# Patient Record
Sex: Female | Born: 1945 | Race: Black or African American | Hispanic: No | Smoking: Never smoker
Health system: Southern US, Community
[De-identification: ages and names within clinical notes are randomized; demographics above are authoritative.]

## PROBLEM LIST (undated history)

## (undated) DIAGNOSIS — G4730 Sleep apnea, unspecified: Secondary | ICD-10-CM

## (undated) DIAGNOSIS — E079 Disorder of thyroid, unspecified: Secondary | ICD-10-CM

## (undated) DIAGNOSIS — I10 Essential (primary) hypertension: Secondary | ICD-10-CM

## (undated) DIAGNOSIS — F32A Depression, unspecified: Secondary | ICD-10-CM

## (undated) DIAGNOSIS — K5792 Diverticulitis of intestine, part unspecified, without perforation or abscess without bleeding: Secondary | ICD-10-CM

## (undated) DIAGNOSIS — J45909 Unspecified asthma, uncomplicated: Secondary | ICD-10-CM

## (undated) DIAGNOSIS — E119 Type 2 diabetes mellitus without complications: Secondary | ICD-10-CM

## (undated) DIAGNOSIS — G8929 Other chronic pain: Secondary | ICD-10-CM

## (undated) DIAGNOSIS — C50919 Malignant neoplasm of unspecified site of unspecified female breast: Secondary | ICD-10-CM

## (undated) DIAGNOSIS — F419 Anxiety disorder, unspecified: Secondary | ICD-10-CM

## (undated) DIAGNOSIS — D649 Anemia, unspecified: Secondary | ICD-10-CM

## (undated) DIAGNOSIS — K219 Gastro-esophageal reflux disease without esophagitis: Secondary | ICD-10-CM

## (undated) DIAGNOSIS — M47817 Spondylosis without myelopathy or radiculopathy, lumbosacral region: Secondary | ICD-10-CM

## (undated) DIAGNOSIS — N189 Chronic kidney disease, unspecified: Secondary | ICD-10-CM

## (undated) HISTORY — PX: BREAST SURGERY: SHX581

## (undated) HISTORY — DX: Spondylosis without myelopathy or radiculopathy, lumbosacral region: M47.817

## (undated) HISTORY — DX: Other chronic pain: G89.29

## (undated) HISTORY — PX: OTHER SURGICAL HISTORY: SHX169

## (undated) HISTORY — PX: THYROIDECTOMY: SHX17

## (undated) HISTORY — PX: ABDOMINAL HYSTERECTOMY: SHX81

## (undated) HISTORY — PX: BACK SURGERY: SHX140

## (undated) HISTORY — DX: Malignant neoplasm of unspecified site of unspecified female breast: C50.919

## (undated) HISTORY — PX: BREAST BIOPSY: SHX20

---

## 2001-07-03 HISTORY — PX: BREAST LUMPECTOMY: SHX2

## 2007-01-01 ENCOUNTER — Emergency Department (HOSPITAL_COMMUNITY): Admission: EM | Admit: 2007-01-01 | Discharge: 2007-01-01 | Payer: Self-pay | Admitting: Family Medicine

## 2011-04-18 LAB — POCT URINALYSIS DIP (DEVICE)
Bilirubin Urine: NEGATIVE
Hgb urine dipstick: NEGATIVE
Ketones, ur: NEGATIVE
Protein, ur: NEGATIVE
Specific Gravity, Urine: <=1.005
pH: 6

## 2020-01-05 ENCOUNTER — Emergency Department (HOSPITAL_COMMUNITY)
Admission: EM | Admit: 2020-01-05 | Discharge: 2020-01-05 | Disposition: A | Discharge status: Home / Self Care | Payer: Medicare (Managed Care) | Attending: Emergency Medicine | Admitting: Emergency Medicine

## 2020-01-05 ENCOUNTER — Emergency Department (HOSPITAL_COMMUNITY): Payer: Medicare (Managed Care)

## 2020-01-05 ENCOUNTER — Encounter (HOSPITAL_COMMUNITY): Payer: Self-pay | Admitting: *Deleted

## 2020-01-05 ENCOUNTER — Other Ambulatory Visit: Payer: Self-pay

## 2020-01-05 DIAGNOSIS — E119 Type 2 diabetes mellitus without complications: Secondary | ICD-10-CM | POA: Insufficient documentation

## 2020-01-05 DIAGNOSIS — R110 Nausea: Secondary | ICD-10-CM | POA: Insufficient documentation

## 2020-01-05 DIAGNOSIS — K529 Noninfective gastroenteritis and colitis, unspecified: Secondary | ICD-10-CM | POA: Insufficient documentation

## 2020-01-05 DIAGNOSIS — Z7982 Long term (current) use of aspirin: Secondary | ICD-10-CM | POA: Diagnosis not present

## 2020-01-05 DIAGNOSIS — Z79899 Other long term (current) drug therapy: Secondary | ICD-10-CM | POA: Diagnosis not present

## 2020-01-05 DIAGNOSIS — I10 Essential (primary) hypertension: Secondary | ICD-10-CM | POA: Diagnosis not present

## 2020-01-05 DIAGNOSIS — R109 Unspecified abdominal pain: Secondary | ICD-10-CM | POA: Diagnosis present

## 2020-01-05 DIAGNOSIS — Z7984 Long term (current) use of oral hypoglycemic drugs: Secondary | ICD-10-CM | POA: Diagnosis not present

## 2020-01-05 HISTORY — DX: Gastro-esophageal reflux disease without esophagitis: K21.9

## 2020-01-05 HISTORY — DX: Type 2 diabetes mellitus without complications: E11.9

## 2020-01-05 HISTORY — DX: Essential (primary) hypertension: I10

## 2020-01-05 HISTORY — DX: Diverticulitis of intestine, part unspecified, without perforation or abscess without bleeding: K57.92

## 2020-01-05 LAB — URINALYSIS, ROUTINE W REFLEX MICROSCOPIC
Bilirubin Urine: NEGATIVE
Glucose, UA: NEGATIVE mg/dL
Ketones, ur: NEGATIVE mg/dL
Leukocytes,Ua: NEGATIVE
Nitrite: NEGATIVE
Protein, ur: NEGATIVE mg/dL
Specific Gravity, Urine: 1.015 (ref 1.005–1.030)
pH: 5 (ref 5.0–8.0)

## 2020-01-05 LAB — COMPREHENSIVE METABOLIC PANEL
ALT: 35 U/L (ref 0–44)
AST: 53 U/L — ABNORMAL HIGH (ref 15–41)
Albumin: 3.8 g/dL (ref 3.5–5.0)
Alkaline Phosphatase: 63 U/L (ref 38–126)
Anion gap: 8 (ref 5–15)
BUN: 21 mg/dL (ref 8–23)
CO2: 23 mmol/L (ref 22–32)
Calcium: 8.8 mg/dL — ABNORMAL LOW (ref 8.9–10.3)
Chloride: 104 mmol/L (ref 98–111)
Creatinine, Ser: 1.29 mg/dL — ABNORMAL HIGH (ref 0.44–1.00)
GFR calc Af Amer: 47 mL/min — ABNORMAL LOW (ref >60)
GFR calc non Af Amer: 41 mL/min — ABNORMAL LOW (ref >60)
Glucose, Bld: 186 mg/dL — ABNORMAL HIGH (ref 70–99)
Potassium: 3.5 mmol/L (ref 3.5–5.1)
Sodium: 135 mmol/L (ref 135–145)
Total Bilirubin: 0.6 mg/dL (ref 0.3–1.2)
Total Protein: 8.3 g/dL — ABNORMAL HIGH (ref 6.5–8.1)

## 2020-01-05 LAB — CBC
HCT: 35.3 % — ABNORMAL LOW (ref 36.0–46.0)
Hemoglobin: 10.9 g/dL — ABNORMAL LOW (ref 12.0–15.0)
MCH: 26.7 pg (ref 26.0–34.0)
MCHC: 30.9 g/dL (ref 30.0–36.0)
MCV: 86.3 fL (ref 80.0–100.0)
Platelets: 292 10*3/uL (ref 150–400)
RBC: 4.09 MIL/uL (ref 3.87–5.11)
RDW: 15.4 % (ref 11.5–15.5)
WBC: 9.5 10*3/uL (ref 4.0–10.5)
nRBC: 0 % (ref 0.0–0.2)

## 2020-01-05 LAB — LIPASE, BLOOD: Lipase: 54 U/L — ABNORMAL HIGH (ref 11–51)

## 2020-01-05 MED ORDER — IOHEXOL 300 MG/ML  SOLN
75.0000 mL | Freq: Once | INTRAMUSCULAR | Status: AC | PRN
  Administered 2020-01-05: 75 mL via INTRAVENOUS

## 2020-01-05 MED ORDER — SODIUM CHLORIDE 0.9 % IV SOLN: INTRAVENOUS | Status: DC

## 2020-01-05 MED ORDER — ONDANSETRON HCL 4 MG/2ML IJ SOLN
4.0000 mg | Freq: Once | INTRAMUSCULAR | Status: AC
  Administered 2020-01-05: 4 mg via INTRAVENOUS
  Filled 2020-01-05: qty 2

## 2020-01-05 MED ORDER — ONDANSETRON 4 MG PO TBDP: 4.0000 mg | ORAL_TABLET | Freq: Three times a day (TID) | ORAL | 1 refills | Status: DC | PRN

## 2020-01-05 MED ORDER — TRAMADOL HCL 50 MG PO TABS: 50.0000 mg | ORAL_TABLET | Freq: Four times a day (QID) | ORAL | 0 refills | Status: DC | PRN

## 2020-01-05 NOTE — ED Triage Notes (Signed)
Abdominal pain with nausea for 3 days

## 2020-01-05 NOTE — ED Provider Notes (Signed)
[facility] EMERGENCY DEPARTMENT Provider Note   CSN: [id number] Arrival date & time: 01/05/20  1727     History Chief Complaint  Patient presents with  . Abdominal Pain  . Nausea    Stephanie Moreno is a 74 y.o. female.  Patient recently located to this area from the [place].  Patient with complaint abdominal pain with nausea for 3 days.  No diarrhea.  Past medical history significant for hypertension acid reflux disease and diabetes.  Patient denies fever denies any blood in her bowel movements.  The abdominal pain is bilateral lower quadrant.  No vomiting.        Past Medical History:  Diagnosis Date  . Acid reflux disease   . Diabetes mellitus without complication ([place])   . Diverticulitis   . Hypertension     There are no problems to display for this patient.      OB History   No obstetric history on file.     No family history on file.  Social History   Tobacco Use  . Smoking status: Never Smoker  . Smokeless tobacco: Never Used  Substance Use Topics  . Alcohol use: Not Currently  . Drug use: Not Currently    Home Medications Prior to Admission medications   Medication Sig Start Date End Date Taking? Authorizing Provider  acetaminophen (TYLENOL) 500 MG tablet Take 500 mg by mouth every 6 (six) hours as needed for mild pain or moderate pain.   Yes [provider]  albuterol (VENTOLIN HFA) 108 (90 Base) MCG/ACT inhaler Inhale 1-2 puffs into the lungs every 6 (six) hours as needed for wheezing or shortness of breath.   Yes [provider]  aspirin EC 81 MG tablet Take 81 mg by mouth every morning. Swallow whole.   Yes [provider]  Calcium Carbonate-Vitamin D (CALCIUM-D PO) Take 1 tablet by mouth daily.   Yes [provider]  cholecalciferol (VITAMIN D3) 25 MCG (1000 UNIT) tablet Take 1,000 Units by mouth daily.   Yes [provider]  Cyanocobalamin (B-12 PO) Take 1 tablet by mouth daily.   Yes [provider]  Dulaglutide (TRULICITY [place]) Inject into the skin every Monday.   Yes [provider]  furosemide (LASIX) 40 MG tablet Take 40 mg by mouth daily.   Yes [provider]  hydroxypropyl methylcellulose / hypromellose (ISOPTO TEARS / GONIOVISC) 2.5 % ophthalmic solution Place 1 drop into both eyes 4 (four) times daily as needed for dry eyes.   Yes [provider]  lidocaine (LIDODERM) 5 % Place 1 patch onto the skin daily. Remove & Discard patch within 12 hours or as directed by MD. Applied to lower back/shoulders/neck for pain   Yes [provider]  metFORMIN (GLUCOPHAGE) 1000 MG tablet Take 1,000 mg by mouth daily.   Yes [provider]  ondansetron (ZOFRAN ODT) 4 MG disintegrating tablet Take 1 tablet (4 mg total) by mouth every 8 (eight) hours as needed. 01/05/20   Fredia Sorrow, MD  traMADol (ULTRAM) 50 MG tablet Take 1 tablet (50 mg total) by mouth every 6 (six) hours as needed. 01/05/20   Fredia Sorrow, MD    Allergies    Codeine, Lisinopril, and Penicillins  Review of Systems   Review of Systems  Constitutional: Negative for chills and fever.  HENT: Negative for congestion, rhinorrhea and sore throat.   Eyes: Negative for visual disturbance.  Respiratory: Negative for cough and shortness of breath.   Cardiovascular: Negative  for chest pain and leg swelling.  Gastrointestinal: Positive for abdominal pain and nausea. Negative for diarrhea and vomiting.  Genitourinary: Negative for dysuria.  Musculoskeletal: Negative for back pain and neck pain.  Skin: Negative for rash.  Neurological: Negative for dizziness, light-headedness and headaches.  Hematological: Does not bruise/bleed easily.  Psychiatric/Behavioral: Negative for confusion.    Physical Exam Updated Vital Signs BP (!) 122/98   Pulse 71   Temp (!) 97.1 F (36.2 C) (Oral)   Resp 19   SpO2 100%   Physical Exam Vitals and nursing note reviewed.  Constitutional:       General: She is not in acute distress.    Appearance: Normal appearance. She is well-developed.  HENT:     Head: Normocephalic and atraumatic.  Eyes:     Extraocular Movements: Extraocular movements intact.     Conjunctiva/sclera: Conjunctivae normal.     Pupils: Pupils are equal, round, and reactive to light.  Cardiovascular:     Rate and Rhythm: Normal rate and regular rhythm.     Heart sounds: No murmur heard.   Pulmonary:     Effort: Pulmonary effort is normal. No respiratory distress.     Breath sounds: Normal breath sounds.  Abdominal:     Palpations: Abdomen is soft.     Tenderness: There is no abdominal tenderness. There is no guarding.  Musculoskeletal:        General: No swelling. Normal range of motion.     Cervical back: Normal range of motion and neck supple.  Skin:    General: Skin is warm and dry.     Capillary Refill: Capillary refill takes less than 2 seconds.  Neurological:     General: No focal deficit present.     Mental Status: She is alert and oriented to person, place, and time.     Cranial Nerves: No cranial nerve deficit.     Sensory: No sensory deficit.     ED Results / Procedures / Treatments   Labs (all labs ordered are listed, but only abnormal results are displayed) Labs Reviewed  LIPASE, BLOOD - Abnormal; Notable for the following components:      Result Value   Lipase 54 (*)    All other components within normal limits  COMPREHENSIVE METABOLIC PANEL - Abnormal; Notable for the following components:   Glucose, Bld 186 (*)    Creatinine, Ser 1.29 (*)    Calcium 8.8 (*)    Total Protein 8.3 (*)    AST 53 (*)    GFR calc non Af Amer 41 (*)    GFR calc Af Amer 47 (*)    All other components within normal limits  CBC - Abnormal; Notable for the following components:   Hemoglobin 10.9 (*)    HCT 35.3 (*)    All other components within normal limits  URINALYSIS, ROUTINE W REFLEX MICROSCOPIC - Abnormal; Notable for the following  components:   Color, Urine STRAW (*)    Hgb urine dipstick SMALL (*)    Bacteria, UA RARE (*)    All other components within normal limits    EKG None  Radiology CT Abdomen Pelvis W Contrast  Result Date: 01/05/2020 CLINICAL DATA:  Abdominal pain, nausea and diarrhea for 3 days, recent hospitalization for diverticulitis EXAM: CT ABDOMEN AND PELVIS WITH CONTRAST TECHNIQUE: Multidetector CT imaging of the abdomen and pelvis was performed using the standard protocol following bolus administration of intravenous contrast. CONTRAST:  109mL OMNIPAQUE IOHEXOL 300 MG/ML  SOLN COMPARISON:  No outside imaging available. FINDINGS: Lower chest: Atelectatic changes in the lung bases. Few bandlike areas of opacity likely reflect further atelectasis or scarring. Lung bases otherwise clear. Normal heart size. No pericardial effusion. Coronary artery osteosclerosis is noted. Hepatobiliary: Diffuse hepatic hypoattenuation compatible with hepatic steatosis. Sparing along the gallbladder fossa. Smooth surface contour of the liver. Some layering density seen towards the gallbladder neck could reflect a noncalcified gallstone, adenomyomatosis or biliary sludge (2/31). No pericholecystic fluid. Borderline dilatation of the biliary tree with the common bile duct measuring up to 8 mm, slightly greater than expected for senescent change. No visible intraductal gallstones. Pancreas: Unremarkable. No pancreatic ductal dilatation or surrounding inflammatory changes. Spleen: Normal in size without focal abnormality. Adrenals/Urinary Tract: Normal adrenal glands. Multiple cortical and parapelvic fluid attenuation cysts. No concerning renal lesions. No urolithiasis or hydronephrosis. Urinary bladder is largely decompressed at the time of exam and therefore poorly evaluated by CT imaging. No gross bladder abnormality. Stomach/Bowel: Distal esophagus, stomach and duodenal sweep are unremarkable. No small bowel wall thickening or  dilatation. No evidence of obstruction. Noninflamed appendix seen in the right lower quadrant. There is some questionable thickening versus underdistention with faint pericolonic stranding involving the descending colon to the level of the sigmoid. Scattered colonic diverticula without focal inflammation to suggest diverticulitis. Vascular/Lymphatic: Atherosclerotic calcifications within the abdominal aorta and branch vessels. No aneurysm or ectasia. No enlarged abdominopelvic lymph nodes. Reproductive: Uterus is surgically absent. No concerning adnexal lesions. Other: No abdominopelvic free fluid or free gas. No bowel containing hernias. Musculoskeletal: Postsurgical changes the anterior abdominal wall with asymmetric atrophy of the right rectus sheath. Multilevel degenerative changes are present in the imaged portions of the spine. Additional degenerative changes in the hips and pelvis. IMPRESSION: 1. Questionable thickening versus underdistention with faint pericolonic stranding involving the descending colon to the level of the sigmoid could reflect a mild colitis. 2. Colonic diverticulosis without evidence of acute diverticulitis. 3. Hepatic steatosis. 4. Some layering density seen towards the gallbladder neck could reflect a noncalcified gallstone, adenomyomatosis or biliary sludge. No pericholecystic fluid. If there is clinical concern for acute cholecystitis, consider further evaluation with right upper quadrant ultrasound. 5. Borderline dilatation of the biliary tree with the common bile duct measuring up to 8 mm, slightly greater than expected for senescent change. No visible intraductal gallstones. Could correlate with liver serologies and consider further evaluation with MRCP if clinically warranted. 6. Aortic Atherosclerosis (ICD10-I70.0). Electronically Signed   By: Lovena Le M.D.   On: 01/05/2020 21:34    Procedures Procedures (including critical care time)  Medications Ordered in  ED Medications  0.9 %  sodium chloride infusion ( Intravenous New Bag/Given 01/05/20 1921)  ondansetron (ZOFRAN) injection 4 mg (4 mg Intravenous Given 01/05/20 1921)  iohexol (OMNIPAQUE) 300 MG/ML solution 75 mL (75 mLs Intravenous Contrast Given 01/05/20 2111)    ED Course  I have reviewed the triage vital signs and the nursing notes.  Pertinent labs & imaging results that were available during my care of the patient were reviewed by me and considered in my medical decision making (see chart for details).    MDM Rules/Calculators/A&P                          Abdomen soft and nontender.  CT scan shows evidence of some colitis.  Some inflammation around the gallbladder but there is absolutely no subjective or objective tenderness in the right upper quadrant.  There was no tenderness to the lower quadrants of the abdomen either.  Patient's liver function tests without significant abnormalities.  No leukocytosis.  Not worried about an acute cholecystitis picture.  Does not even sound as if her pain is in that location.  Her pain could be related to the colitis.  Will refer to GI medicine.  Will start on tramadol and Zofran.  Since no fever and no leukocytosis will not start on antibiotics.  Patient stable nontoxic Final Clinical Impression(s) / ED Diagnoses Final diagnoses:  Colitis    Rx / DC Orders ED Discharge Orders         Ordered    ondansetron (ZOFRAN ODT) 4 MG disintegrating tablet  Every 8 hours PRN     Discontinue  Reprint     01/05/20 2256    traMADol (ULTRAM) 50 MG tablet  Every 6 hours PRN     Discontinue  Reprint     01/05/20 2256           Fredia Sorrow, MD 01/05/20 2259

## 2020-01-05 NOTE — Discharge Instructions (Addendum)
CT scan showed evidence of large intestine inflammation called colitis.  Would recommend a bland diet for the next few days.  Take the Zofran as needed for nausea.  Take the pain medicine as needed.  Return for any new or worse symptoms.  Make an appointment to follow-up with GI medicine.  Dr. Roseanne Kaufman office information provided above.

## 2020-01-06 ENCOUNTER — Telehealth: Payer: Self-pay | Admitting: Internal Medicine

## 2020-01-06 ENCOUNTER — Telehealth: Payer: Self-pay

## 2020-01-06 NOTE — Telephone Encounter (Signed)
Pt was seen at the ED yesterday 01/05/20 and was asked to make an apt with RMR. Pt moved to [place] last week and doesn't have a PCP yet. cell [phone]

## 2020-01-06 NOTE — Telephone Encounter (Signed)
Er referral

## 2020-01-06 NOTE — Telephone Encounter (Signed)
Ok to schedule ov.  

## 2020-01-07 ENCOUNTER — Encounter: Payer: Self-pay | Admitting: Internal Medicine

## 2020-01-07 NOTE — Telephone Encounter (Signed)
Noted  

## 2020-01-07 NOTE — Telephone Encounter (Signed)
Patient scheduled for appointment and called and sent letter

## 2020-01-28 ENCOUNTER — Ambulatory Visit (INDEPENDENT_AMBULATORY_CARE_PROVIDER_SITE_OTHER): Payer: Medicare (Managed Care) | Admitting: Gastroenterology

## 2020-01-28 ENCOUNTER — Other Ambulatory Visit: Payer: Self-pay

## 2020-01-28 ENCOUNTER — Encounter: Payer: Self-pay | Admitting: Gastroenterology

## 2020-01-28 ENCOUNTER — Telehealth: Payer: Self-pay | Admitting: *Deleted

## 2020-01-28 ENCOUNTER — Other Ambulatory Visit (HOSPITAL_COMMUNITY)
Admission: RE | Admit: 2020-01-28 | Discharge: 2020-01-28 | Disposition: A | Discharge status: Home / Self Care | Payer: Medicare (Managed Care) | Source: Ambulatory Visit | Attending: Gastroenterology | Admitting: Gastroenterology

## 2020-01-28 DIAGNOSIS — R1030 Lower abdominal pain, unspecified: Secondary | ICD-10-CM

## 2020-01-28 DIAGNOSIS — D649 Anemia, unspecified: Secondary | ICD-10-CM

## 2020-01-28 DIAGNOSIS — K5900 Constipation, unspecified: Secondary | ICD-10-CM | POA: Diagnosis not present

## 2020-01-28 DIAGNOSIS — R1013 Epigastric pain: Secondary | ICD-10-CM | POA: Diagnosis not present

## 2020-01-28 LAB — IRON AND TIBC
Iron: 42 ug/dL (ref 28–170)
Saturation Ratios: 10 % — ABNORMAL LOW (ref 10.4–31.8)
TIBC: 425 ug/dL (ref 250–450)
UIBC: 383 ug/dL

## 2020-01-28 LAB — CBC WITH DIFFERENTIAL/PLATELET
Abs Immature Granulocytes: 0.02 10*3/uL (ref 0.00–0.07)
Basophils Absolute: 0.1 10*3/uL (ref 0.0–0.1)
Basophils Relative: 1 %
Eosinophils Absolute: 0.4 10*3/uL (ref 0.0–0.5)
Eosinophils Relative: 5 %
HCT: 38.6 % (ref 36.0–46.0)
Hemoglobin: 11.9 g/dL — ABNORMAL LOW (ref 12.0–15.0)
Immature Granulocytes: 0 %
Lymphocytes Relative: 52 %
Lymphs Abs: 3.7 10*3/uL (ref 0.7–4.0)
MCH: 26.6 pg (ref 26.0–34.0)
MCHC: 30.8 g/dL (ref 30.0–36.0)
MCV: 86.4 fL (ref 80.0–100.0)
Monocytes Absolute: 0.4 10*3/uL (ref 0.1–1.0)
Monocytes Relative: 6 %
Neutro Abs: 2.6 10*3/uL (ref 1.7–7.7)
Neutrophils Relative %: 36 %
Platelets: 326 10*3/uL (ref 150–400)
RBC: 4.47 MIL/uL (ref 3.87–5.11)
RDW: 15.2 % (ref 11.5–15.5)
WBC: 7.1 10*3/uL (ref 4.0–10.5)
nRBC: 0 % (ref 0.0–0.2)

## 2020-01-28 LAB — COMPREHENSIVE METABOLIC PANEL
ALT: 43 U/L (ref 0–44)
AST: 65 U/L — ABNORMAL HIGH (ref 15–41)
Albumin: 4 g/dL (ref 3.5–5.0)
Alkaline Phosphatase: 73 U/L (ref 38–126)
Anion gap: 9 (ref 5–15)
BUN: 13 mg/dL (ref 8–23)
CO2: 25 mmol/L (ref 22–32)
Calcium: 9 mg/dL (ref 8.9–10.3)
Chloride: 103 mmol/L (ref 98–111)
Creatinine, Ser: 1.1 mg/dL — ABNORMAL HIGH (ref 0.44–1.00)
GFR calc Af Amer: 57 mL/min — ABNORMAL LOW (ref >60)
GFR calc non Af Amer: 49 mL/min — ABNORMAL LOW (ref >60)
Glucose, Bld: 129 mg/dL — ABNORMAL HIGH (ref 70–99)
Potassium: 3.7 mmol/L (ref 3.5–5.1)
Sodium: 137 mmol/L (ref 135–145)
Total Bilirubin: 0.5 mg/dL (ref 0.3–1.2)
Total Protein: 8.5 g/dL — ABNORMAL HIGH (ref 6.5–8.1)

## 2020-01-28 LAB — FERRITIN: Ferritin: 19 ng/mL (ref 11–307)

## 2020-01-28 MED ORDER — DOCUSATE SODIUM 100 MG PO CAPS: 100.0000 mg | ORAL_CAPSULE | Freq: Two times a day (BID) | ORAL | 3 refills | Status: DC

## 2020-01-28 MED ORDER — PANTOPRAZOLE SODIUM 40 MG PO TBEC: 40.0000 mg | DELAYED_RELEASE_TABLET | Freq: Every day | ORAL | 3 refills | Status: DC

## 2020-01-28 NOTE — Patient Instructions (Signed)
We are arranging a colonoscopy and upper endoscopy with Dr. Abbey Chatters in the near future.  For constipation: let's trial a stool softener twice per day. Give this a few days and if no improvement, then start taking the [place] samples once each morning, 30 minutes before breakfast. This can cause some loose stool when first starting out but should get better. If this is not helpful, we will increase the dose.  Start taking Protonix once each morning, 30 minutes before breakfast. This is for reflux. I feel this will probably help your upper abdominal discomfort and nausea, too.  We will likely be ordering an MRI in the near future after review of labs.  We will see you in 2 months!  It was a pleasure to see you today. I want to create trusting relationships with patients to provide genuine, compassionate, and quality care. I value your feedback. If you receive a survey regarding your visit,  I greatly appreciate you taking time to fill this out.   Annitta Needs, PhD, ANP-BC [facility]

## 2020-01-28 NOTE — Telephone Encounter (Signed)
Called spoke with pt. She is scheduled for tcs/egd with propofol with Dr. Abbey Chatters 8/19 at 9:15am. Pt aware needs covid test prior and is scheduled for 8/18 at 8:05am. She is aware will mail prep instructions for miralax and covid appt. Confirmed mailing address is correct.

## 2020-01-28 NOTE — Progress Notes (Signed)
Primary Care Physician:  Patient, No Pcp Per  Referring Physician: [facility]  Primary Gastroenterologist:  Dr. Abbey Chatters  Chief Complaint  Patient presents with  . Nausea    HPI:   Stephanie Moreno is a 74 y.o. female presenting today at the request of [facility] due to colitis. She used to live in [place], moving here the end of June 2021. Reports long-standing history of abdominal pain but feels this has been worsening recently, prompting ED presentation. Trying to establish care with a local PCP but does not remember the name. States A1c was around a 9 before moving here. States her GFR was 33. Her sister is an Therapist, sports and used to work at [employer] many years ago.    Has been having nausea, sharp abdominal pain, worsening from baseline. Has been to the ED 3 times, going to ED in [place]. By time she had gotten to the ED in [place] pain had calmed down. Bloody diarrhea in [place] several years ago but none recently.   Still with abdominal pain intermittently, crampy. Feels like Moreno to have a BM but strains and only small balls come out. Pain with slight improvement after BM. Taken Miralax but causes looser stool everywhere. Miralax twice a week. BM about twice per week unless taking Miralax.   Constipation chronic. Has had to take laxatives in the past. carafate prescribed in [place]. Pain after eating in epigastric area. Burning pain and some improvement after eating. Takes only extra strength tylenol. +GERD. No PPI. Years ago was on a PPI. No known history of PUD. Used to weigh around 185 in May at PCP's office. Now 180.   Nausea: eats a small amount and is associated with burning sensation. Carafate helps with the nausea. Has been on Carafate about a year.    Colonoscopy several years ago in [place] and believes she had polyps.EGD about 2 years ago in [place] but unclear results.   No family history of colorectal cancer. 6-7 months ago started having  difficulty ambulating. States she was having trouble walking forwards and would start walking backwards. Has had falls. Was worried about being place in a nursing facility. Lived alone since 2003. A year or so ago lost 5 sisters. 3 sisters left.   Past Medical History:  Diagnosis Date  . Acid reflux disease   . Breast cancer ([place])   . Diabetes mellitus without complication ([place])   . Diverticulitis   . Hypertension     Past Surgical History:  Procedure Laterality Date  . BREAST SURGERY    . THYROIDECTOMY    . tummy tuck     at time of breast surgery 2003    Current Outpatient Medications  Medication Sig Dispense Refill  . aspirin EC 81 MG tablet Take 81 mg by mouth every morning. Swallow whole.    . furosemide (LASIX) 40 MG tablet Take 40 mg by mouth daily.    Marland Kitchen lidocaine (LIDODERM) 5 % Place 1 patch onto the skin daily as needed (pain). Remove & Discard patch within 12 hours or as directed by MD. Applied to lower back/shoulders/neck for pain     . metFORMIN (GLUCOPHAGE) 1000 MG tablet Take 1,000 mg by mouth daily.    . ondansetron (ZOFRAN ODT) 4 MG disintegrating tablet Take 1 tablet (4 mg total) by mouth every 8 (eight) hours as needed. (Patient not taking: Reported on 01/30/2020) 10 tablet 1  . sucralfate (CARAFATE) 1 GM/10ML suspension Take 1.5 g by  mouth 3 (three) times daily as needed (indigestion).     . traMADol (ULTRAM) 50 MG tablet Take 1 tablet (50 mg total) by mouth every 6 (six) hours as needed. (Patient not taking: Reported on 01/30/2020) 15 tablet 0  . acetaminophen (TYLENOL) 500 MG tablet Take 1,000 mg by mouth 2 (two) times daily as needed for moderate pain.    Marland Kitchen acetaminophen (TYLENOL) 650 MG CR tablet Take 1,300 mg by mouth daily as needed (migraines).     Marland Kitchen amitriptyline (ELAVIL) 25 MG tablet Take 25 mg by mouth at bedtime.    . beclomethasone (QVAR) 80 MCG/ACT inhaler Inhale 1 puff into the lungs 2 (two) times daily as needed (shortness of breath).    .  calcium-vitamin D (OSCAL WITH D) 500-200 MG-UNIT tablet Take 1 tablet by mouth 3 (three) times daily.    Marland Kitchen docusate sodium (COLACE) 100 MG capsule Take 1 capsule (100 mg total) by mouth 2 (two) times daily. (Patient not taking: Reported on 01/30/2020) 60 capsule 3  . Dulaglutide (TRULICITY) 1.5 TK/1.6WF SOPN Inject 1.5 mg into the skin every Monday.    Marland Kitchen glipiZIDE (GLUCOTROL XL) 10 MG 24 hr tablet Take 10 mg by mouth daily with breakfast.    . GLUCOSAMINE-CHONDROITIN PO Take 1 tablet by mouth daily.    Marland Kitchen linaclotide (LINZESS) 72 MCG capsule Take 72 mcg by mouth daily.    Marland Kitchen losartan (COZAAR) 50 MG tablet Take 100 mg by mouth daily.    . magnesium oxide (MAG-OX) 400 MG tablet Take 400 mg by mouth daily.    . meclizine (ANTIVERT) 25 MG tablet Take 25 mg by mouth 3 (three) times daily as needed for dizziness.    . metoprolol tartrate (LOPRESSOR) 50 MG tablet Take 50 mg by mouth 2 (two) times daily.    . Naphazoline HCl (CLEAR EYES OP) Place 1 drop into both eyes daily as needed (dry eyes).    Marland Kitchen omeprazole (PRILOSEC) 40 MG capsule Take 40 mg by mouth daily.    . pantoprazole (PROTONIX) 40 MG tablet Take 1 tablet (40 mg total) by mouth daily. 30 minutes before breakfast daily (Patient not taking: Reported on 01/30/2020) 30 tablet 3  . polyethylene glycol (MIRALAX / GLYCOLAX) 17 g packet Take 17 g by mouth daily as needed for moderate constipation.    . rosuvastatin (CRESTOR) 10 MG tablet Take 10 mg by mouth at bedtime.     . verapamil (VERELAN PM) 180 MG 24 hr capsule Take 180 mg by mouth daily.    . vitamin B-12 (CYANOCOBALAMIN) 1000 MCG tablet Take 1,000 mcg by mouth 2 (two) times daily.     No current facility-administered medications for this visit.    Allergies as of 01/28/2020 - Review Complete 01/28/2020  Allergen Reaction Noted  . Codeine Nausea And Vomiting 01/05/2020  . Lisinopril Nausea And Vomiting 01/05/2020  . Penicillins Hives 01/05/2020    Family History  Problem Relation Age of  Onset  . Colon cancer Neg Hx     Social History   Socioeconomic History  . Marital status: Divorced    Spouse name: Not on file  . Number of children: Not on file  . Years of education: Not on file  . Highest education level: Not on file  Occupational History  . 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 Smoking status: Never Smoker  . Smokeless tobacco: Never Used  Substance and Sexual Activity  .  Alcohol use: Not Currently  . Drug use: Not Currently  . Sexual activity: Not on file  Other Topics Concern  . Not on file  Social History Narrative  . Not on file   Social Determinants of Health   Financial Resource Strain:   . Difficulty of Paying Living Expenses:   Food Insecurity:   . Worried About Charity fundraiser in the Last Year:   . Arboriculturist in the Last Year:   Transportation Moreno:   . Film/video editor (Medical):   Marland Kitchen Lack of Transportation (Non-Medical):   Physical Activity:   . Days of Exercise per Week:   . Minutes of Exercise per Session:   Stress:   . Feeling of Stress :   Social Connections:   . Frequency of Communication with Friends and Family:   . Frequency of Social Gatherings with Friends and Family:   . Attends Religious Services:   . Active Member of Clubs or Organizations:   . Attends Archivist Meetings:   Marland Kitchen Marital Status:   Intimate Partner Violence:   . Fear of Current or Ex-Partner:   . Emotionally Abused:   Marland Kitchen Physically Abused:   . Sexually Abused:     Review of Systems: Gen: see HPI CV: Denies chest pain, heart palpitations, peripheral edema, syncope.  Resp: Denies shortness of breath at rest or with exertion. Denies wheezing or cough.  GI: see HPI GU : Denies urinary burning, urinary frequency, urinary hesitancy MS: see HPI  Derm: Denies rash, itching, dry skin Psych: Denies depression, anxiety, memory loss, and confusion Heme: Denies bruising, bleeding, and  enlarged lymph nodes.  Physical Exam: BP (!) 154/85   Pulse 99   Temp (!) 96.2 F (35.7 C) (Temporal)   Ht 5\' 4"  (1.626 m)   Wt 180 lb (81.6 kg)   BMI 30.90 kg/m  General:   Alert and oriented. Pleasant and cooperative. Well-nourished and well-developed.  Head:  Normocephalic and atraumatic. Eyes:  Without icterus, sclera clear and conjunctiva pink.  Ears:  Normal auditory acuity. Mouth:  Mask in place  Lungs:  Clear to auscultation bilaterally. No wheezes, rales, or rhonchi. No distress.  Heart:  S1, S2 present without murmurs appreciated.  Abdomen:  +BS, soft, TTP epigastric and lower abdomen. No HSM noted. No guarding or rebound. No masses appreciated.  Rectal:  Deferred Extremities:  Pedal edema Neurologic:  Alert and  oriented x4 Skin:  Intact without significant lesions or rashes. Psych:  Alert and cooperative. Normal mood and affect.  ASSESSMENT: DENYA BUCKINGHAM is a 74 y.o. female presenting today with a long-standing history of abdominal pain and nausea, previously established with GI in [place] but recently moving to the area in June 2021.   Recent imaging in the ED July 5th, 2021, with questionable thickening versus under-distension and faint pericolonic stranding involving descending colon to level of the sigmoid. She has no diarrhea. No antibiotics have been prescribed; in fact, she describes more constipation that I feel is contributing to her abdominal discomfort. With colonoscopy several years ago in [place] and reported history of polyps, will arrange colonoscopy in the near future.   Dyspepsia also reported but actually improved after eating. She has been chronically on Carafate from [place] but no PPI. Will start Protonix once daily and add EGD at time of colonoscopy. Symptoms not consistent with biliary etiology. NO weight loss or postprandial pain to raise concern for chronic mesenteric ischemia.   Normocytic anemia: unknown  baseline. Recheck CBC  and add iron studies. TCS/EGD as planned.   CBD dilation 8 mm: gallbladder remains present. Check HFP. May ultimately need MRCP for further evaluation.   As of note, plan of care was discussed with patient's sister at her request, a retired Therapist, sports.    PLAN:  Proceed with TCS/EGD with Dr. Abbey Chatters in near future: the risks, benefits, and alternatives have been discussed with the patient in detail. The patient states understanding and desires to proceed.  Linzess 72 mcg samples provided, may need to trial Amitiza if fails Linzess  CBC, CMP, iron studies today  Consider MRCP  Return in 2 months  Stephanie Needs, PhD, [facility]

## 2020-02-03 ENCOUNTER — Telehealth: Payer: Self-pay | Admitting: Emergency Medicine

## 2020-02-03 NOTE — Telephone Encounter (Signed)
error 

## 2020-02-04 ENCOUNTER — Other Ambulatory Visit: Payer: Self-pay | Admitting: Emergency Medicine

## 2020-02-04 DIAGNOSIS — R1030 Lower abdominal pain, unspecified: Secondary | ICD-10-CM

## 2020-02-05 NOTE — Progress Notes (Signed)
NO PCP PER PATIENT °

## 2020-02-06 ENCOUNTER — Encounter (HOSPITAL_COMMUNITY)
Admission: RE | Admit: 2020-02-06 | Discharge: 2020-02-06 | Disposition: A | Discharge status: Home / Self Care | Payer: Medicare HMO | Source: Ambulatory Visit | Attending: Internal Medicine | Admitting: Internal Medicine

## 2020-02-06 ENCOUNTER — Other Ambulatory Visit: Payer: Self-pay

## 2020-02-06 ENCOUNTER — Encounter (HOSPITAL_COMMUNITY): Payer: Self-pay

## 2020-02-06 HISTORY — DX: Disorder of thyroid, unspecified: E07.9

## 2020-02-06 HISTORY — DX: Sleep apnea, unspecified: G47.30

## 2020-02-10 ENCOUNTER — Other Ambulatory Visit: Payer: Self-pay | Admitting: *Deleted

## 2020-02-10 ENCOUNTER — Encounter: Payer: Self-pay | Admitting: *Deleted

## 2020-02-10 ENCOUNTER — Telehealth: Payer: Self-pay | Admitting: *Deleted

## 2020-02-10 DIAGNOSIS — D649 Anemia, unspecified: Secondary | ICD-10-CM

## 2020-02-10 DIAGNOSIS — R1030 Lower abdominal pain, unspecified: Secondary | ICD-10-CM

## 2020-02-10 DIAGNOSIS — R1013 Epigastric pain: Secondary | ICD-10-CM

## 2020-02-10 DIAGNOSIS — K5900 Constipation, unspecified: Secondary | ICD-10-CM

## 2020-02-10 DIAGNOSIS — K838 Other specified diseases of biliary tract: Secondary | ICD-10-CM

## 2020-02-10 NOTE — Telephone Encounter (Signed)
PA approve through [facility] for MRI/MRCP. Auth# [id number] Dates 02/10/2020-08/08/2020

## 2020-02-12 DIAGNOSIS — R1030 Lower abdominal pain, unspecified: Secondary | ICD-10-CM | POA: Diagnosis not present

## 2020-02-13 LAB — HEPATITIS B SURFACE ANTIGEN: Hepatitis B Surface Ag: NONREACTIVE

## 2020-02-13 LAB — HEPATITIS C ANTIBODY
Hepatitis C Ab: NONREACTIVE
SIGNAL TO CUT-OFF: 0.08 (ref <1.00)

## 2020-02-18 ENCOUNTER — Other Ambulatory Visit (HOSPITAL_COMMUNITY)
Admission: RE | Admit: 2020-02-18 | Discharge: 2020-02-18 | Disposition: A | Discharge status: Home / Self Care | Payer: Medicare HMO | Source: Ambulatory Visit | Attending: Internal Medicine | Admitting: Internal Medicine

## 2020-02-18 ENCOUNTER — Other Ambulatory Visit: Payer: Self-pay

## 2020-02-18 DIAGNOSIS — Z01812 Encounter for preprocedural laboratory examination: Secondary | ICD-10-CM | POA: Insufficient documentation

## 2020-02-18 DIAGNOSIS — Z20822 Contact with and (suspected) exposure to covid-19: Secondary | ICD-10-CM | POA: Diagnosis not present

## 2020-02-18 LAB — SARS CORONAVIRUS 2 (TAT 6-24 HRS): SARS Coronavirus 2: NEGATIVE

## 2020-02-19 ENCOUNTER — Other Ambulatory Visit: Payer: Self-pay

## 2020-02-19 ENCOUNTER — Ambulatory Visit (HOSPITAL_COMMUNITY): Payer: Medicare HMO | Admitting: Anesthesiology

## 2020-02-19 ENCOUNTER — Encounter (HOSPITAL_COMMUNITY): Payer: Self-pay | Admitting: *Deleted

## 2020-02-19 ENCOUNTER — Ambulatory Visit (HOSPITAL_COMMUNITY)
Admission: RE | Admit: 2020-02-19 | Discharge: 2020-02-19 | Disposition: A | Discharge status: Home / Self Care | Payer: Medicare HMO | Attending: Internal Medicine | Admitting: Internal Medicine

## 2020-02-19 ENCOUNTER — Encounter (HOSPITAL_COMMUNITY)
Admission: RE | Disposition: A | Discharge status: Home / Self Care | Payer: Self-pay | Source: Home / Self Care | Attending: Internal Medicine

## 2020-02-19 DIAGNOSIS — Z7982 Long term (current) use of aspirin: Secondary | ICD-10-CM | POA: Insufficient documentation

## 2020-02-19 DIAGNOSIS — Z7984 Long term (current) use of oral hypoglycemic drugs: Secondary | ICD-10-CM | POA: Diagnosis not present

## 2020-02-19 DIAGNOSIS — R1013 Epigastric pain: Secondary | ICD-10-CM | POA: Diagnosis not present

## 2020-02-19 DIAGNOSIS — K222 Esophageal obstruction: Secondary | ICD-10-CM | POA: Insufficient documentation

## 2020-02-19 DIAGNOSIS — E119 Type 2 diabetes mellitus without complications: Secondary | ICD-10-CM | POA: Insufficient documentation

## 2020-02-19 DIAGNOSIS — K2950 Unspecified chronic gastritis without bleeding: Secondary | ICD-10-CM | POA: Insufficient documentation

## 2020-02-19 DIAGNOSIS — Z79899 Other long term (current) drug therapy: Secondary | ICD-10-CM | POA: Diagnosis not present

## 2020-02-19 DIAGNOSIS — R1030 Lower abdominal pain, unspecified: Secondary | ICD-10-CM | POA: Diagnosis not present

## 2020-02-19 DIAGNOSIS — K648 Other hemorrhoids: Secondary | ICD-10-CM | POA: Diagnosis not present

## 2020-02-19 DIAGNOSIS — G4730 Sleep apnea, unspecified: Secondary | ICD-10-CM | POA: Insufficient documentation

## 2020-02-19 DIAGNOSIS — K5730 Diverticulosis of large intestine without perforation or abscess without bleeding: Secondary | ICD-10-CM | POA: Diagnosis not present

## 2020-02-19 DIAGNOSIS — Z885 Allergy status to narcotic agent status: Secondary | ICD-10-CM | POA: Insufficient documentation

## 2020-02-19 DIAGNOSIS — K5900 Constipation, unspecified: Secondary | ICD-10-CM | POA: Insufficient documentation

## 2020-02-19 DIAGNOSIS — G4733 Obstructive sleep apnea (adult) (pediatric): Secondary | ICD-10-CM | POA: Diagnosis not present

## 2020-02-19 DIAGNOSIS — R1310 Dysphagia, unspecified: Secondary | ICD-10-CM | POA: Diagnosis not present

## 2020-02-19 DIAGNOSIS — I10 Essential (primary) hypertension: Secondary | ICD-10-CM | POA: Insufficient documentation

## 2020-02-19 DIAGNOSIS — Z888 Allergy status to other drugs, medicaments and biological substances status: Secondary | ICD-10-CM | POA: Diagnosis not present

## 2020-02-19 DIAGNOSIS — Z880 Allergy status to penicillin: Secondary | ICD-10-CM | POA: Insufficient documentation

## 2020-02-19 DIAGNOSIS — K219 Gastro-esophageal reflux disease without esophagitis: Secondary | ICD-10-CM | POA: Insufficient documentation

## 2020-02-19 DIAGNOSIS — K2970 Gastritis, unspecified, without bleeding: Secondary | ICD-10-CM | POA: Diagnosis not present

## 2020-02-19 HISTORY — PX: BIOPSY: SHX5522

## 2020-02-19 HISTORY — PX: ESOPHAGOGASTRODUODENOSCOPY (EGD) WITH PROPOFOL: SHX5813

## 2020-02-19 HISTORY — PX: COLONOSCOPY WITH PROPOFOL: SHX5780

## 2020-02-19 LAB — GLUCOSE, CAPILLARY: Glucose-Capillary: 191 mg/dL — ABNORMAL HIGH (ref 70–99)

## 2020-02-19 SURGERY — COLONOSCOPY WITH PROPOFOL
Anesthesia: General

## 2020-02-19 MED ORDER — CHLORHEXIDINE GLUCONATE CLOTH 2 % EX PADS: 6.0000 | MEDICATED_PAD | Freq: Once | CUTANEOUS | Status: DC

## 2020-02-19 MED ORDER — PROPOFOL 10 MG/ML IV BOLUS
INTRAVENOUS | Status: DC | PRN
  Administered 2020-02-19: 20 mg via INTRAVENOUS

## 2020-02-19 MED ORDER — LACTATED RINGERS IV SOLN
Freq: Once | INTRAVENOUS | Status: AC
  Administered 2020-02-19: 1000 mL via INTRAVENOUS

## 2020-02-19 MED ORDER — PHENYLEPHRINE 40 MCG/ML (10ML) SYRINGE FOR IV PUSH (FOR BLOOD PRESSURE SUPPORT)
PREFILLED_SYRINGE | INTRAVENOUS | Status: DC | PRN
  Administered 2020-02-19 (×4): 40 ug via INTRAVENOUS

## 2020-02-19 MED ORDER — GLYCOPYRROLATE 0.2 MG/ML IJ SOLN
0.1000 mg | Freq: Once | INTRAMUSCULAR | Status: AC
  Administered 2020-02-19: 0.1 mg via INTRAVENOUS

## 2020-02-19 MED ORDER — KETAMINE HCL 10 MG/ML IJ SOLN
INTRAMUSCULAR | Status: DC | PRN
  Administered 2020-02-19: 10 mg via INTRAVENOUS

## 2020-02-19 MED ORDER — LIDOCAINE VISCOUS HCL 2 % MT SOLN
15.0000 mL | Freq: Once | OROMUCOSAL | Status: AC
  Administered 2020-02-19: 15 mL via OROMUCOSAL

## 2020-02-19 MED ORDER — LIDOCAINE VISCOUS HCL 2 % MT SOLN
OROMUCOSAL | Status: AC
  Filled 2020-02-19: qty 15

## 2020-02-19 MED ORDER — LIDOCAINE HCL (CARDIAC) PF 100 MG/5ML IV SOSY
PREFILLED_SYRINGE | INTRAVENOUS | Status: DC | PRN
  Administered 2020-02-19: 50 mg via INTRATRACHEAL

## 2020-02-19 MED ORDER — LACTATED RINGERS IV SOLN: INTRAVENOUS | Status: DC | PRN

## 2020-02-19 MED ORDER — PROPOFOL 500 MG/50ML IV EMUL
INTRAVENOUS | Status: DC | PRN
  Administered 2020-02-19: 150 ug/kg/min via INTRAVENOUS

## 2020-02-19 MED ORDER — GLYCOPYRROLATE 0.2 MG/ML IJ SOLN
INTRAMUSCULAR | Status: AC
  Filled 2020-02-19: qty 1

## 2020-02-19 MED ORDER — KETAMINE HCL 50 MG/5ML IJ SOSY
PREFILLED_SYRINGE | INTRAMUSCULAR | Status: AC
  Filled 2020-02-19: qty 5

## 2020-02-19 MED ORDER — GLYCOPYRROLATE 0.2 MG/ML IJ SOLN: 0.2000 mg | Freq: Once | INTRAMUSCULAR | Status: DC

## 2020-02-19 MED ORDER — OMEPRAZOLE 40 MG PO CPDR: 40.0000 mg | DELAYED_RELEASE_CAPSULE | Freq: Two times a day (BID) | ORAL | 5 refills | Status: DC

## 2020-02-19 NOTE — Op Note (Signed)
[facility] Patient Name: Stephanie Moreno Procedure Date: 02/19/2020 8:51 AM MRN: [id number] Date of Birth: Apr 27, 1946 Attending MD: Elon Alas. Edgar Frisk CSN: [id number] Age: 74 Admit Type: Outpatient Procedure:                Upper GI endoscopy Indications:              Epigastric abdominal pain, Dysphagia Providers:                Elon Alas. Abbey Chatters, DO, Janeece Riggers, RN, Lambert Mody, Casimer Bilis, Technician,                            Randa Spike, Technician Referring MD:              Medicines:                See the Anesthesia note for documentation of the                            administered medications Complications:            No immediate complications. Estimated Blood Loss:     Estimated blood loss was minimal. Procedure:                Pre-Anesthesia Assessment:                           - The anesthesia plan was to use monitored                            anesthesia care (MAC).                           After obtaining informed consent, the endoscope was                            passed under direct vision. Throughout the                            procedure, the patient's blood pressure, pulse, and                            oxygen saturations were monitored continuously. The                            [id number]-H190 ([id number]) scope was introduced through the                            mouth, and advanced to the second part of duodenum.                            The upper GI endoscopy was accomplished without                            difficulty. The patient tolerated  the procedure                            well. Scope In: 8:59:50 AM Scope Out: 9:05:52 AM Total Procedure Duration: 0 hours 6 minutes 2 seconds  Findings:      The Z-line was regular and was found 39 cm from the incisors.      One benign-appearing, intrinsic mild stenosis was found in the lower       third of the esophagus. The stenosis was traversed. A TTS  dilator was       passed through the scope. Dilation with an 18-19-20 mm balloon dilator       was performed to 20 mm. The dilation site was examined and showed       moderate improvement in luminal narrowing.      Diffuse mild inflammation characterized by erythema was found in the       entire examined stomach. Biopsies were taken with a cold forceps for       Helicobacter pylori testing.      The duodenal bulb, first portion of the duodenum and second portion of       the duodenum were normal. Biopsies for histology were taken with a cold       forceps for evaluation of celiac disease. Impression:               - Z-line regular, 39 cm from the incisors.                           - Benign-appearing esophageal stenosis. Dilated.                           - Gastritis. Biopsied.                           - Normal duodenal bulb, first portion of the                            duodenum and second portion of the duodenum.                            Biopsied. Moderate Sedation:      Per Anesthesia Care Recommendation:           - Patient has a contact number available for                            emergencies. The signs and symptoms of potential                            delayed complications were discussed with the                            patient. Return to normal activities tomorrow.                            Written discharge instructions were provided to the  patient.                           - Resume previous diet.                           - Continue present medications.                           - Await pathology results.                           - Repeat upper endoscopy PRN for retreatment.                           - Return to GI clinic as previously scheduled.                           - Use Prilosec (omeprazole) 40 mg PO BID for 8                            weeks. Procedure Code(s):        --- Professional ---                           [id number],  Esophagogastroduodenoscopy, flexible,                            transoral; with transendoscopic balloon dilation of                            esophagus (less than 30 mm diameter)                           43239, 59, Esophagogastroduodenoscopy, flexible,                            transoral; with biopsy, single or multiple Diagnosis Code(s):        --- Professional ---                           K22.2, Esophageal obstruction                           K29.70, Gastritis, unspecified, without bleeding                           R10.13, Epigastric pain                           R13.10, Dysphagia, unspecified CPT copyright 2019 American Medical Association. All rights reserved. The codes documented in this report are preliminary and upon coder review may  be revised to meet current compliance requirements. Elon Alas. Abbey Chatters, [place] Abbey Chatters, DO 02/19/2020 9:10:38 AM This report has been signed electronically. Number of Addenda: 0

## 2020-02-19 NOTE — Anesthesia Preprocedure Evaluation (Addendum)
Anesthesia Evaluation  Patient identified by MRN, date of birth, ID band Patient awake    Reviewed: Allergy & Precautions, NPO status , Patient's Chart, lab work & pertinent test results, reviewed documented beta blocker date and time   History of Anesthesia Complications Negative for: history of anesthetic complications  Airway Mallampati: II  TM Distance: >3 FB Neck ROM: Full    Dental  (+) Upper Dentures, Edentulous Lower   Pulmonary shortness of breath and with exertion, sleep apnea and Continuous Positive Airway Pressure Ventilation ,    Pulmonary exam normal breath sounds clear to auscultation       Cardiovascular METS (uses walker, knee pain and back pain): hypertension, Pt. on medications and Pt. on home beta blockers Normal cardiovascular exam Rhythm:Regular Rate:Normal     Neuro/Psych negative neurological ROS  negative psych ROS   GI/Hepatic Neg liver ROS, GERD  Medicated and Controlled,  Endo/Other  diabetes, Well Controlled, Type 2, Oral Hypoglycemic Agents  Renal/GU negative Renal ROS  negative genitourinary   Musculoskeletal  (+) Arthritis  (knee pain, back pain),   Abdominal   Peds  Hematology  (+) anemia ,   Anesthesia Other Findings   Reproductive/Obstetrics negative OB ROS                            Anesthesia Physical Anesthesia Plan  ASA: III  Anesthesia Plan: General   Post-op Pain Management:    Induction: Intravenous  PONV Risk Score and Plan: TIVA  Airway Management Planned: Nasal Cannula and Natural Airway  Additional Equipment:   Intra-op Plan:   Post-operative Plan:   Informed Consent: I have reviewed the patients History and Physical, chart, labs and discussed the procedure including the risks, benefits and alternatives for the proposed anesthesia with the patient or authorized representative who has indicated his/her understanding and acceptance.      Dental advisory given  Plan Discussed with: CRNA and Surgeon  Anesthesia Plan Comments: (As per patient she saw cardiologist last year in different state, had stress test 2 years ago, moved to [place] recently, Patient has low exercise tolerance, low risk procedures, risks explained to the patient. )      Anesthesia Quick Evaluation

## 2020-02-19 NOTE — Op Note (Signed)
[facility] Patient Name: Stephanie Moreno Procedure Date: 02/19/2020 9:10 AM MRN: [id number] Date of Birth: May 08, 1946 Attending MD: Elon Alas. Abbey Chatters DO CSN: [id number] Age: 74 Admit Type: Outpatient Procedure:                Colonoscopy Indications:              Lower abdominal pain, Suspected colitis Providers:                Elon Alas. Abbey Chatters, DO, Janeece Riggers, RN, Randa Spike, Technician Referring MD:              Medicines:                See the Anesthesia note for documentation of the                            administered medications Complications:            No immediate complications. Estimated Blood Loss:     Estimated blood loss: none. Procedure:                Pre-Anesthesia Assessment:                           - The anesthesia plan was to use monitored                            anesthesia care (MAC).                           After obtaining informed consent, the colonoscope                            was passed under direct vision. Throughout the                            procedure, the patient's blood pressure, pulse, and                            oxygen saturations were monitored continuously. The                            PCF-H190DL ([id number]) scope was introduced through                            the anus and advanced to the the cecum, identified                            by appendiceal orifice and ileocecal valve. The                            colonoscopy was performed without difficulty. The                            patient tolerated the procedure well. The quality  of the bowel preparation was evaluated using the                            BBPS [facility] Bowel Preparation Scale) with scores                            of: Right Colon = 2 (minor amount of residual                            staining, small fragments of stool and/or opaque                            liquid, but mucosa seen well),  Transverse Colon = 3                            (entire mucosa seen well with no residual staining,                            small fragments of stool or opaque liquid) and Left                            Colon = 3 (entire mucosa seen well with no residual                            staining, small fragments of stool or opaque                            liquid). The total BBPS score equals 8. The quality                            of the bowel preparation was good. Scope In: 9:12:12 AM Scope Out: 9:27:27 AM Scope Withdrawal Time: 0 hours 7 minutes 44 seconds  Total Procedure Duration: 0 hours 15 minutes 15 seconds  Findings:      The perianal and digital rectal examinations were normal.      Non-bleeding internal hemorrhoids were found during endoscopy.      Many small-mouthed diverticula were found in the entire colon.      The exam was otherwise without abnormality. Impression:               - Non-bleeding internal hemorrhoids.                           - Diverticulosis in the entire examined colon.                           - The examination was otherwise normal.                           - No specimens collected. Moderate Sedation:      Per Anesthesia Care Recommendation:           - Patient has a contact number available for  emergencies. The signs and symptoms of potential                            delayed complications were discussed with the                            patient. Return to normal activities tomorrow.                            Written discharge instructions were provided to the                            patient.                           - Resume previous diet.                           - Continue present medications.                           - No repeat colonoscopy due to age.                           - Return to GI clinic as previously scheduled. Procedure Code(s):        --- Professional ---                           [phone],  Colonoscopy, flexible; diagnostic, including                            collection of specimen(s) by brushing or washing,                            when performed (separate procedure) Diagnosis Code(s):        --- Professional ---                           K64.8, Other hemorrhoids                           R10.30, Lower abdominal pain, unspecified                           K57.30, Diverticulosis of large intestine without                            perforation or abscess without bleeding CPT copyright 2019 American Medical Association. All rights reserved. The codes documented in this report are preliminary and upon coder review may  be revised to meet current compliance requirements. Elon Alas. Abbey Chatters, [name], DO 02/19/2020 9:32:44 AM This report has been signed electronically. Number of Addenda: 0

## 2020-02-19 NOTE — H&P (Signed)
H&P Update  I have reviewed the history and physicals located in the electronic medical record, that have been performed within the 30 days prior to surgery today, and I concur with the assessment therein. I have seen and examined the patient today and I find no substantive changes that would alter the course of therapy/surgery today.  

## 2020-02-19 NOTE — Anesthesia Postprocedure Evaluation (Signed)
Anesthesia Post Note  Patient: Stephanie Moreno  Procedure(s) Performed: COLONOSCOPY WITH PROPOFOL (N/A ) ESOPHAGOGASTRODUODENOSCOPY (EGD) WITH PROPOFOL (N/A ) BIOPSY  Patient location during evaluation: PACU Anesthesia Type: General Level of consciousness: awake Pain management: pain level controlled Vital Signs Assessment: post-procedure vital signs reviewed and stable Respiratory status: spontaneous breathing Cardiovascular status: stable Postop Assessment: no apparent nausea or vomiting Anesthetic complications: no   No complications documented.   Last Vitals:  Vitals:   02/19/20 0940 02/19/20 0945  BP: 100/64 101/69  Pulse: 67 67  Resp: (!) 24 (!) 21  Temp: 36.5 C   SpO2: 98% 97%    Last Pain:  Vitals:   02/19/20 0940  TempSrc: Oral  PainSc: 0-No pain                 Kise[place] Rollo Hristova

## 2020-02-19 NOTE — Discharge Instructions (Addendum)
EGD Discharge instructions Please read the instructions outlined below and refer to this sheet in the next few weeks. These discharge instructions provide you with general information on caring for yourself after you leave the hospital. Your doctor may also give you specific instructions. While your treatment has been planned according to the most current medical practices available, unavoidable complications occasionally occur. If you have any problems or questions after discharge, please call your doctor. ACTIVITY  You may resume your regular activity but move at a slower pace for the next 24 hours.   Take frequent rest periods for the next 24 hours.   Walking will help expel (get rid of) the air and reduce the bloated feeling in your abdomen.   No driving for 24 hours (because of the anesthesia (medicine) used during the test).   You may shower.   Do not sign any important legal documents or operate any machinery for 24 hours (because of the anesthesia used during the test).  NUTRITION  Drink plenty of fluids.   You may resume your normal diet.   Begin with a light meal and progress to your normal diet.   Avoid alcoholic beverages for 24 hours or as instructed by your caregiver.  MEDICATIONS  You may resume your normal medications unless your caregiver tells you otherwise.  WHAT YOU CAN EXPECT TODAY  You may experience abdominal discomfort such as a feeling of fullness or gas pains.  FOLLOW-UP  Your doctor will discuss the results of your test with you.  SEEK IMMEDIATE MEDICAL ATTENTION IF ANY OF THE FOLLOWING OCCUR:  Excessive nausea (feeling sick to your stomach) and/or vomiting.   Severe abdominal pain and distention (swelling).   Trouble swallowing.   Temperature over 101 F (37.8 C).   Rectal bleeding or vomiting of blood.    Colonoscopy Discharge Instructions  Read the instructions outlined below and refer to this sheet in the next few weeks. These  discharge instructions provide you with general information on caring for yourself after you leave the hospital. Your doctor may also give you specific instructions. While your treatment has been planned according to the most current medical practices available, unavoidable complications occasionally occur.   ACTIVITY  You may resume your regular activity, but move at a slower pace for the next 24 hours.   Take frequent rest periods for the next 24 hours.   Walking will help get rid of the air and reduce the bloated feeling in your belly (abdomen).   No driving for 24 hours (because of the medicine (anesthesia) used during the test).    Do not sign any important legal documents or operate any machinery for 24 hours (because of the anesthesia used during the test).  NUTRITION  Drink plenty of fluids.   You may resume your normal diet as instructed by your doctor.   Begin with a light meal and progress to your normal diet. Heavy or fried foods are harder to digest and may make you feel sick to your stomach (nauseated).   Avoid alcoholic beverages for 24 hours or as instructed.  MEDICATIONS  You may resume your normal medications unless your doctor tells you otherwise.  WHAT YOU CAN EXPECT TODAY  Some feelings of bloating in the abdomen.   Passage of more gas than usual.   Spotting of blood in your stool or on the toilet paper.  IF YOU HAD POLYPS REMOVED DURING THE COLONOSCOPY:  No aspirin products for 7 days or as instructed.  No alcohol for 7 days or as instructed.   Eat a soft diet for the next 24 hours.  FINDING OUT THE RESULTS OF YOUR TEST Not all test results are available during your visit. If your test results are not back during the visit, make an appointment with your caregiver to find out the results. Do not assume everything is normal if you have not heard from your caregiver or the medical facility. It is important for you to follow up on all of your test results.    SEEK IMMEDIATE MEDICAL ATTENTION IF:  You have more than a spotting of blood in your stool.   Your belly is swollen (abdominal distention).   You are nauseated or vomiting.   You have a temperature over 101.   You have abdominal pain or discomfort that is severe or gets worse throughout the day.    Esophageal Dilatation Esophageal dilatation, also called esophageal dilation, is a procedure to widen or open (dilate) a blocked or narrowed part of the esophagus. The esophagus is the part of the body that moves food and liquid from the mouth to the stomach. You may need this procedure if:  You have a buildup of scar tissue in your esophagus that makes it difficult, painful, or impossible to swallow. This can be caused by gastroesophageal reflux disease (GERD).  You have cancer of the esophagus.  There is a problem with how food moves through your esophagus. In some cases, you may need this procedure repeated at a later time to dilate the esophagus gradually. Tell a health care provider about:  Any allergies you have.  All medicines you are taking, including vitamins, herbs, eye drops, creams, and over-the-counter medicines.  Any problems you or family members have had with anesthetic medicines.  Any blood disorders you have.  Any surgeries you have had.  Any medical conditions you have.  Any antibiotic medicines you are required to take before dental procedures.  Whether you are pregnant or may be pregnant. What are the risks? Generally, this is a safe procedure. However, problems may occur, including:  Bleeding due to a tear in the lining of the esophagus.  A hole (perforation) in the esophagus. What happens before the procedure?  Follow instructions from your health care provider about eating or drinking restrictions.  Ask your health care provider about changing or stopping your regular medicines. This is especially important if you are taking diabetes medicines or  blood thinners.  Plan to have someone take you home from the hospital or clinic.  Plan to have a responsible adult care for you for at least 24 hours after you leave the hospital or clinic. This is important. What happens during the procedure?  You may be given a medicine to help you relax (sedative).  A numbing medicine may be sprayed into the back of your throat, or you may gargle the medicine.  Your health care provider may perform the dilatation using various surgical instruments, such as: ? Simple dilators. This instrument is carefully placed in the esophagus to stretch it. ? Guided wire bougies. This involves using an endoscope to insert a wire into the esophagus. A dilator is passed over this wire to enlarge the esophagus. Then the wire is removed. ? Balloon dilators. An endoscope with a small balloon at the end is inserted into the esophagus. The balloon is inflated to stretch the esophagus and open it up. The procedure may vary among health care providers and hospitals. What happens after  the procedure?  Your blood pressure, heart rate, breathing rate, and blood oxygen level will be monitored until the medicines you were given have worn off.  Your throat may feel slightly sore and numb. This will improve slowly over time.  You will not be allowed to eat or drink until your throat is no longer numb.  When you are able to drink, urinate, and sit on the edge of the bed without nausea or dizziness, you may be able to return home. Follow these instructions at home:  Take over-the-counter and prescription medicines only as told by your health care provider.  Do not drive for 24 hours if you were given a sedative during your procedure.  You should have a responsible adult with you for 24 hours after the procedure.  Follow instructions from your health care provider about any eating or drinking restrictions.  Do not use any products that contain nicotine or tobacco, such as  cigarettes and e-cigarettes. If you need help quitting, ask your health care provider.  Keep all follow-up visits as told by your health care provider. This is important. Get help right away if you:  Have a fever.  Have chest pain.  Have pain that is not relieved by medication.  Have trouble breathing.  Have trouble swallowing.  Vomit blood. Summary  Esophageal dilatation, also called esophageal dilation, is a procedure to widen or open (dilate) a blocked or narrowed part of the esophagus.  Plan to have someone take you home from the hospital or clinic.  For this procedure, a numbing medicine may be sprayed into the back of your throat, or you may gargle the medicine.  Do not drive for 24 hours if you were given a sedative during your procedure. This information is not intended to replace advice given to you by your health care provider. Make sure you discuss any questions you have with your health care provider. Document Revised: 04/16/2019 Document Reviewed: 04/24/2017 Elsevier Patient Education  2020 [facility] EGD showed a mild esophageal stenosis which I dilated. You also have inflammation in your stomach. I recommend increasing your omeprazole to 40 mg twice daily for 8 weeks then decrease back down to once daily.   Your colonoscopy looked normal. No polyps found. No inflammation.   Follow up with GI as previously scheduled.   I hope you have a great rest of your week!

## 2020-02-19 NOTE — Transfer of Care (Signed)
Immediate Anesthesia Transfer of Care Note  Patient: Stephanie Moreno  Procedure(s) Performed: COLONOSCOPY WITH PROPOFOL (N/A ) ESOPHAGOGASTRODUODENOSCOPY (EGD) WITH PROPOFOL (N/A ) BIOPSY  Patient Location: PACU  Anesthesia Type:General  Level of Consciousness: awake  Airway & Oxygen Therapy: Patient Spontanous Breathing  Post-op Assessment: Report given to RN and Post -op Vital signs reviewed and stable  Post vital signs: Reviewed and stable  Last Vitals:  Vitals Value Taken Time  BP    Temp    Pulse    Resp    SpO2      Last Pain:  Vitals:   02/19/20 0756  TempSrc: Oral  PainSc: 0-No pain      Patients Stated Pain Goal: 6 (41/28/20 8138)  Complications: No complications documented.

## 2020-02-20 ENCOUNTER — Other Ambulatory Visit: Payer: Self-pay

## 2020-02-20 LAB — SURGICAL PATHOLOGY

## 2020-02-26 ENCOUNTER — Ambulatory Visit: Payer: Medicare (Managed Care) | Admitting: Gastroenterology

## 2020-02-27 ENCOUNTER — Other Ambulatory Visit: Payer: Self-pay | Admitting: Gastroenterology

## 2020-02-27 ENCOUNTER — Other Ambulatory Visit: Payer: Self-pay

## 2020-02-27 ENCOUNTER — Ambulatory Visit (HOSPITAL_COMMUNITY)
Admission: RE | Admit: 2020-02-27 | Discharge: 2020-02-27 | Disposition: A | Discharge status: Home / Self Care | Payer: Medicare HMO | Source: Ambulatory Visit | Attending: Gastroenterology | Admitting: Gastroenterology

## 2020-02-27 DIAGNOSIS — K760 Fatty (change of) liver, not elsewhere classified: Secondary | ICD-10-CM | POA: Diagnosis not present

## 2020-02-27 DIAGNOSIS — N281 Cyst of kidney, acquired: Secondary | ICD-10-CM | POA: Diagnosis not present

## 2020-02-27 DIAGNOSIS — K838 Other specified diseases of biliary tract: Secondary | ICD-10-CM | POA: Diagnosis not present

## 2020-02-27 DIAGNOSIS — R935 Abnormal findings on diagnostic imaging of other abdominal regions, including retroperitoneum: Secondary | ICD-10-CM | POA: Diagnosis not present

## 2020-02-27 MED ORDER — GADOBUTROL 1 MMOL/ML IV SOLN
8.0000 mL | Freq: Once | INTRAVENOUS | Status: AC | PRN
  Administered 2020-02-27: 8 mL via INTRAVENOUS

## 2020-03-01 ENCOUNTER — Other Ambulatory Visit: Payer: Self-pay

## 2020-03-01 ENCOUNTER — Encounter: Payer: Self-pay | Admitting: Emergency Medicine

## 2020-03-01 ENCOUNTER — Ambulatory Visit
Admission: EM | Admit: 2020-03-01 | Discharge: 2020-03-01 | Disposition: A | Discharge status: Home / Self Care | Payer: Medicare HMO | Attending: Emergency Medicine | Admitting: Emergency Medicine

## 2020-03-01 DIAGNOSIS — N3001 Acute cystitis with hematuria: Secondary | ICD-10-CM

## 2020-03-01 DIAGNOSIS — R300 Dysuria: Secondary | ICD-10-CM | POA: Diagnosis not present

## 2020-03-01 LAB — POCT URINALYSIS DIP (MANUAL ENTRY)
Glucose, UA: 250 mg/dL — AB
Nitrite, UA: POSITIVE — AB
Protein Ur, POC: >=300 mg/dL — AB
Spec Grav, UA: 1.02 (ref 1.010–1.025)
Urobilinogen, UA: 4 E.U./dL — AB
pH, UA: 5 (ref 5.0–8.0)

## 2020-03-01 MED ORDER — SULFAMETHOXAZOLE-TRIMETHOPRIM 800-160 MG PO TABS: 1.0000 | ORAL_TABLET | Freq: Two times a day (BID) | ORAL | 0 refills | Status: AC

## 2020-03-01 NOTE — ED Triage Notes (Signed)
Burning with urination x1 week.

## 2020-03-01 NOTE — Discharge Instructions (Signed)
Urine concerning for UTI Urine culture sent.  We will call you with the results.   Push fluids and get plenty of rest.   Take antibiotic as directed and to completion Follow up with PCP if symptoms persists Return here or go to ER if you have any new or worsening symptoms such as fever, worsening abdominal pain, nausea/vomiting, flank pain, etc... 

## 2020-03-01 NOTE — ED Provider Notes (Signed)
[facility]   CC: Burning with urination  SUBJECTIVE:  Stephanie Moreno is a 74 y.o. female who complains of burning with urination, frequency, an durgency x 1 week.  Had a procedure last week.  Report lower abdominal pressure.  Has tried OTC AZO without relief.  Symptoms are made worse with urination.  Admits to similar symptoms in the past.  Complains of associated nausea.  Denies fever, chills, vomiting, flank pain, hematuria.    LMP: No LMP recorded. Patient has had a hysterectomy.  ROS: As in HPI.  All other pertinent ROS negative.     Past Medical History:  Diagnosis Date   Acid reflux disease    Breast cancer ([place])    Diabetes mellitus without complication ([place])    Diverticulitis    Hypertension    Sleep apnea    Thyroid disease    Past Surgical History:  Procedure Laterality Date   BREAST SURGERY     THYROIDECTOMY     tummy tuck     at time of breast surgery 2003   Allergies  Allergen Reactions   Codeine Nausea And Vomiting   Lisinopril Nausea And Vomiting   Penicillins Hives   No current facility-administered medications on file prior to encounter.   Current Outpatient Medications on File Prior to Encounter  Medication Sig Dispense Refill   acetaminophen (TYLENOL) 500 MG tablet Take 1,000 mg by mouth 2 (two) times daily as needed for moderate pain.     acetaminophen (TYLENOL) 650 MG CR tablet Take 1,300 mg by mouth daily as needed (migraines).      amitriptyline (ELAVIL) 25 MG tablet Take 25 mg by mouth at bedtime.     aspirin EC 81 MG tablet Take 81 mg by mouth every morning. Swallow whole.     beclomethasone (QVAR) 80 MCG/ACT inhaler Inhale 1 puff into the lungs 2 (two) times daily as needed (shortness of breath).     calcium-vitamin D (OSCAL WITH D) 500-200 MG-UNIT tablet Take 1 tablet by mouth 3 (three) times daily.     Dulaglutide (TRULICITY) 1.5 VZ/8.5YI SOPN Inject 1.5 mg into the skin every Monday.     furosemide (LASIX) 40  MG tablet Take 40 mg by mouth daily.     glipiZIDE (GLUCOTROL XL) 10 MG 24 hr tablet Take 10 mg by mouth daily with breakfast.     GLUCOSAMINE-CHONDROITIN PO Take 1 tablet by mouth daily.     lidocaine (LIDODERM) 5 % Place 1 patch onto the skin daily as needed (pain). Remove & Discard patch within 12 hours or as directed by MD. Applied to lower back/shoulders/neck for pain      linaclotide (LINZESS) 72 MCG capsule Take 72 mcg by mouth daily.     losartan (COZAAR) 50 MG tablet Take 100 mg by mouth daily.     magnesium oxide (MAG-OX) 400 MG tablet Take 400 mg by mouth daily.     meclizine (ANTIVERT) 25 MG tablet Take 25 mg by mouth 3 (three) times daily as needed for dizziness.     metFORMIN (GLUCOPHAGE) 1000 MG tablet Take 1,000 mg by mouth daily.     metoprolol tartrate (LOPRESSOR) 50 MG tablet Take 50 mg by mouth 2 (two) times daily.     Naphazoline HCl (CLEAR EYES OP) Place 1 drop into both eyes daily as needed (dry eyes).     omeprazole (PRILOSEC) 40 MG capsule Take 1 capsule (40 mg total) by mouth in the morning and at bedtime. 60 capsule  5   polyethylene glycol (MIRALAX / GLYCOLAX) 17 g packet Take 17 g by mouth daily as needed for moderate constipation.     rosuvastatin (CRESTOR) 10 MG tablet Take 10 mg by mouth at bedtime.      sucralfate (CARAFATE) 1 GM/10ML suspension Take 1.5 g by mouth 3 (three) times daily as needed (indigestion).      verapamil (VERELAN PM) 180 MG 24 hr capsule Take 180 mg by mouth daily.     vitamin B-12 (CYANOCOBALAMIN) 1000 MCG tablet Take 1,000 mcg by mouth 2 (two) times daily.     Social History   Socioeconomic History   Marital status: Divorced    Spouse name: Not on file   Number of children: Not on file   Years of education: Not on file   Highest education level: Not on file  Occupational History   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 [place]  Tobacco Use   Smoking status: Never Smoker   Smokeless  tobacco: Never Used  Substance and Sexual Activity   Alcohol use: Not Currently   Drug use: Not Currently   Sexual activity: Not on file  Other Topics Concern   Not on file  Social History Narrative   Not on file   Social Determinants of Health   Financial Resource Strain:    Difficulty of Paying Living Expenses: Not on file  Food Insecurity:    Worried About [place] in the Last Year: Not on file   [place] in the Last Year: Not on file  Transportation Needs:    Lack of Transportation (Medical): Not on file   Lack of Transportation (Non-Medical): Not on file  Physical Activity:    Days of Exercise per Week: Not on file   Minutes of Exercise per Session: Not on file  Stress:    Feeling of Stress : Not on file  Social Connections:    Frequency of Communication with Friends and Family: Not on file   Frequency of Social Gatherings with Friends and Family: Not on file   Attends Religious Services: Not on file   Active Member of Clubs or Organizations: Not on file   Attends Archivist Meetings: Not on file   Marital Status: Not on file  Intimate Partner Violence:    Fear of Current or Ex-Partner: Not on file   Emotionally Abused: Not on file   Physically Abused: Not on file   Sexually Abused: Not on file   Family History  Problem Relation Age of Onset   Colon cancer Neg Hx     OBJECTIVE:  Vitals:   03/01/20 1018 03/01/20 1019  BP:  (!) 163/103  Pulse:  96  Resp:  17  Temp:  98.5 F (36.9 C)  TempSrc:  Oral  SpO2:  94%  Weight: 178 lb 9.2 oz (81 kg)   Height: 5\' 3"  (1.6 m)    General appearance: Alert in no acute distress HEENT: NCAT.  Oropharynx clear.  Lungs: clear to auscultation bilaterally without adventitious breath sounds Heart: regular rate and rhythm.   Abdomen: soft; non-distended; no tenderness; bowel sounds present; no guarding Back: no CVA tenderness Extremities: no edema; symmetrical with no  gross deformities Skin: warm and dry Neurologic: Ambulates from chair to exam table without difficulty Psychological: alert and cooperative; normal mood and affect  Labs Reviewed  POCT URINALYSIS DIP (MANUAL ENTRY) - Abnormal; Notable for the following components:  Result Value   Color, UA orange (*)    Clarity, UA cloudy (*)    Glucose, UA =250 (*)    Bilirubin, UA small (*)    Ketones, POC UA trace (5) (*)    Blood, UA large (*)    Protein Ur, POC >=300 (*)    Urobilinogen, UA 4.0 (*)    Nitrite, UA Positive (*)    Leukocytes, UA Large (3+) (*)    All other components within normal limits  URINE CULTURE    ASSESSMENT & PLAN:  1. Dysuria   2. Acute cystitis with hematuria     Meds ordered this encounter  Medications   sulfamethoxazole-trimethoprim (BACTRIM DS) 800-160 MG tablet    Sig: Take 1 tablet by mouth 2 (two) times daily for 10 days.    Dispense:  20 tablet    Refill:  0    Order Specific Question:   Supervising Provider    Answer:   Raylene Everts [[id number]]   Urine concerning for UTI Urine culture sent.  We will call you with the results.   Push fluids and get plenty of rest.   Take antibiotic as directed and to completion Follow up with PCP if symptoms persists Return here or go to ER if you have any new or worsening symptoms such as fever, worsening abdominal pain, nausea/vomiting, flank pain, etc...  Outlined signs and symptoms indicating need for more acute intervention. Patient verbalized understanding. After Visit Summary given.     Lestine Box, PA-C 03/01/20 1046

## 2020-03-02 ENCOUNTER — Other Ambulatory Visit: Payer: Self-pay

## 2020-03-02 DIAGNOSIS — D649 Anemia, unspecified: Secondary | ICD-10-CM

## 2020-03-03 LAB — URINE CULTURE: Culture: >=100000 — AB

## 2020-03-05 ENCOUNTER — Encounter (HOSPITAL_COMMUNITY): Payer: Self-pay | Admitting: Internal Medicine

## 2020-03-09 DIAGNOSIS — C50219 Malignant neoplasm of upper-inner quadrant of unspecified female breast: Secondary | ICD-10-CM | POA: Diagnosis not present

## 2020-03-09 DIAGNOSIS — M545 Low back pain: Secondary | ICD-10-CM | POA: Diagnosis not present

## 2020-03-09 DIAGNOSIS — K30 Functional dyspepsia: Secondary | ICD-10-CM | POA: Diagnosis not present

## 2020-03-09 DIAGNOSIS — E539 Vitamin B deficiency, unspecified: Secondary | ICD-10-CM | POA: Diagnosis not present

## 2020-03-09 DIAGNOSIS — E1122 Type 2 diabetes mellitus with diabetic chronic kidney disease: Secondary | ICD-10-CM | POA: Diagnosis not present

## 2020-03-09 DIAGNOSIS — D519 Vitamin B12 deficiency anemia, unspecified: Secondary | ICD-10-CM | POA: Diagnosis not present

## 2020-03-09 DIAGNOSIS — Z9013 Acquired absence of bilateral breasts and nipples: Secondary | ICD-10-CM | POA: Diagnosis not present

## 2020-03-09 DIAGNOSIS — Z90722 Acquired absence of ovaries, bilateral: Secondary | ICD-10-CM | POA: Diagnosis not present

## 2020-03-09 DIAGNOSIS — N1831 Chronic kidney disease, stage 3a: Secondary | ICD-10-CM | POA: Diagnosis not present

## 2020-03-09 DIAGNOSIS — K5900 Constipation, unspecified: Secondary | ICD-10-CM | POA: Diagnosis not present

## 2020-03-09 DIAGNOSIS — G4730 Sleep apnea, unspecified: Secondary | ICD-10-CM | POA: Diagnosis not present

## 2020-03-09 LAB — VITAMIN D 25 HYDROXY (VIT D DEFICIENCY, FRACTURES): Vit D, 25-Hydroxy: 34.1

## 2020-03-09 LAB — BASIC METABOLIC PANEL
BUN: 9 (ref 4–21)
Creatinine: 1.5 — AB (ref 0.5–1.1)

## 2020-03-09 LAB — COMPREHENSIVE METABOLIC PANEL
GFR calc Af Amer: 40
GFR calc non Af Amer: 35

## 2020-03-09 LAB — TSH: TSH: 1.18 (ref 0.41–5.90)

## 2020-03-09 LAB — HEMOGLOBIN A1C: Hemoglobin A1C: 8.8

## 2020-03-10 LAB — LIPID PANEL
Cholesterol: 186 (ref 0–200)
HDL: 57 (ref 35–70)
LDL Cholesterol: 109
Triglycerides: 110 (ref 40–160)

## 2020-03-15 ENCOUNTER — Other Ambulatory Visit: Payer: Self-pay

## 2020-03-15 DIAGNOSIS — D649 Anemia, unspecified: Secondary | ICD-10-CM

## 2020-03-16 DIAGNOSIS — M545 Low back pain: Secondary | ICD-10-CM | POA: Diagnosis not present

## 2020-03-16 DIAGNOSIS — Z9013 Acquired absence of bilateral breasts and nipples: Secondary | ICD-10-CM | POA: Diagnosis not present

## 2020-03-16 DIAGNOSIS — G4730 Sleep apnea, unspecified: Secondary | ICD-10-CM | POA: Diagnosis not present

## 2020-03-16 DIAGNOSIS — R350 Frequency of micturition: Secondary | ICD-10-CM | POA: Diagnosis not present

## 2020-03-16 DIAGNOSIS — N390 Urinary tract infection, site not specified: Secondary | ICD-10-CM | POA: Diagnosis not present

## 2020-03-16 DIAGNOSIS — C50219 Malignant neoplasm of upper-inner quadrant of unspecified female breast: Secondary | ICD-10-CM | POA: Diagnosis not present

## 2020-03-16 DIAGNOSIS — E1122 Type 2 diabetes mellitus with diabetic chronic kidney disease: Secondary | ICD-10-CM | POA: Diagnosis not present

## 2020-03-16 DIAGNOSIS — Z90722 Acquired absence of ovaries, bilateral: Secondary | ICD-10-CM | POA: Diagnosis not present

## 2020-03-16 DIAGNOSIS — K5900 Constipation, unspecified: Secondary | ICD-10-CM | POA: Diagnosis not present

## 2020-03-16 DIAGNOSIS — E539 Vitamin B deficiency, unspecified: Secondary | ICD-10-CM | POA: Diagnosis not present

## 2020-03-16 DIAGNOSIS — R945 Abnormal results of liver function studies: Secondary | ICD-10-CM | POA: Diagnosis not present

## 2020-03-16 DIAGNOSIS — K30 Functional dyspepsia: Secondary | ICD-10-CM | POA: Diagnosis not present

## 2020-03-16 DIAGNOSIS — N1831 Chronic kidney disease, stage 3a: Secondary | ICD-10-CM | POA: Diagnosis not present

## 2020-03-26 DIAGNOSIS — Z79899 Other long term (current) drug therapy: Secondary | ICD-10-CM | POA: Diagnosis not present

## 2020-03-26 DIAGNOSIS — Z7982 Long term (current) use of aspirin: Secondary | ICD-10-CM | POA: Diagnosis not present

## 2020-03-26 DIAGNOSIS — Z7984 Long term (current) use of oral hypoglycemic drugs: Secondary | ICD-10-CM | POA: Diagnosis not present

## 2020-03-26 DIAGNOSIS — E782 Mixed hyperlipidemia: Secondary | ICD-10-CM | POA: Diagnosis not present

## 2020-03-26 DIAGNOSIS — E1122 Type 2 diabetes mellitus with diabetic chronic kidney disease: Secondary | ICD-10-CM | POA: Diagnosis not present

## 2020-03-26 DIAGNOSIS — Z9181 History of falling: Secondary | ICD-10-CM | POA: Diagnosis not present

## 2020-03-26 DIAGNOSIS — I129 Hypertensive chronic kidney disease with stage 1 through stage 4 chronic kidney disease, or unspecified chronic kidney disease: Secondary | ICD-10-CM | POA: Diagnosis not present

## 2020-03-26 DIAGNOSIS — N3289 Other specified disorders of bladder: Secondary | ICD-10-CM | POA: Diagnosis not present

## 2020-03-26 DIAGNOSIS — G4733 Obstructive sleep apnea (adult) (pediatric): Secondary | ICD-10-CM | POA: Diagnosis not present

## 2020-03-26 DIAGNOSIS — Z853 Personal history of malignant neoplasm of breast: Secondary | ICD-10-CM | POA: Diagnosis not present

## 2020-03-26 DIAGNOSIS — N3946 Mixed incontinence: Secondary | ICD-10-CM | POA: Diagnosis not present

## 2020-03-26 DIAGNOSIS — N390 Urinary tract infection, site not specified: Secondary | ICD-10-CM | POA: Diagnosis not present

## 2020-03-26 DIAGNOSIS — N1832 Chronic kidney disease, stage 3b: Secondary | ICD-10-CM | POA: Diagnosis not present

## 2020-03-26 DIAGNOSIS — Z9013 Acquired absence of bilateral breasts and nipples: Secondary | ICD-10-CM | POA: Diagnosis not present

## 2020-03-26 DIAGNOSIS — E539 Vitamin B deficiency, unspecified: Secondary | ICD-10-CM | POA: Diagnosis not present

## 2020-03-26 DIAGNOSIS — M545 Low back pain, unspecified: Secondary | ICD-10-CM | POA: Diagnosis not present

## 2020-04-02 ENCOUNTER — Ambulatory Visit: Payer: Medicare HMO | Admitting: Podiatry

## 2020-04-09 ENCOUNTER — Other Ambulatory Visit: Payer: Self-pay

## 2020-04-09 ENCOUNTER — Ambulatory Visit (INDEPENDENT_AMBULATORY_CARE_PROVIDER_SITE_OTHER): Payer: Medicaid - Out of State | Admitting: Gastroenterology

## 2020-04-09 ENCOUNTER — Encounter: Payer: Self-pay | Admitting: Gastroenterology

## 2020-04-09 VITALS — BP 194/93 | HR 74 | Temp 97.0°F | Ht 63.0 in | Wt 170.2 lb

## 2020-04-09 DIAGNOSIS — K5900 Constipation, unspecified: Secondary | ICD-10-CM

## 2020-04-09 DIAGNOSIS — R1013 Epigastric pain: Secondary | ICD-10-CM

## 2020-04-09 DIAGNOSIS — R634 Abnormal weight loss: Secondary | ICD-10-CM | POA: Diagnosis not present

## 2020-04-09 MED ORDER — PANTOPRAZOLE SODIUM 40 MG PO TBEC: 40.0000 mg | DELAYED_RELEASE_TABLET | Freq: Every day | ORAL | 3 refills | Status: DC

## 2020-04-09 MED ORDER — LINACLOTIDE 72 MCG PO CAPS: 72.0000 ug | ORAL_CAPSULE | Freq: Every day | ORAL | 3 refills | Status: DC

## 2020-04-09 NOTE — Progress Notes (Signed)
Referring Provider: No ref. provider found Primary Care Physician:  Patient, No Pcp Per Primary GI: Dr. Abbey Chatters  Chief Complaint  Patient presents with  . Abdominal Pain    hemorrhoids bleeding occasionally    HPI:   Stephanie Moreno is a 74 y.o. female presenting today with a long-standing history of abdominal pain and nausea, previously established with GI in [place] but moved to this area in June 2021. Colonoscopy and EGD completed in interim from last visit with internal hemorrhoids on colonoscopy and benign-appearing esophageal stenosis s/p dilation, gastritis s/p biopsy, normal duodenum. Negative H.pylori, negative duodenal biopsy.   Gallbladder remains in situ. Due to mildly dilated CBD on CT, MRI was completed which showed mild dilation of CBD measuring up to 55mm, likely age-related. Fatty liver. Mildly elevated AST, isolated elevation. Negative Hep B surface antigen and Hep C antibody, no iron overload, component of IDA noted. Anemia multifactorial in setting of chronic disease.   Constipation: provided Linzess 72 mcg samples at initial visit. Was taking as needed. Ran out. BM about every 2-3 days and started taking generic Miralax, but it takes awhile for it to work. Takes once a week. Rectal discomfort if straining. Occasional rectal bleeding with wiping. Happens once a week to every other week.    She has lost 10 lbs since July visit. Doesn't have much of an appetite. Eats breakfast around 1pm. Around 5pm eats peanut butter crackers.   Notes epigastric pain every other day. Was taking omeprazole but states pain was worse with that. Tried to eat first then still had pain. About an hour after eating, will have a little discomfort but then goes away. Notes nausea before and after eating. Tries drinking cranberry juice with water or something salty helps.    Past Medical History:  Diagnosis Date  . Acid reflux disease   . Breast cancer ([place])   . Diabetes mellitus  without complication ([place])   . Diverticulitis   . Hypertension   . Sleep apnea   . Thyroid disease     Past Surgical History:  Procedure Laterality Date  . BIOPSY  02/19/2020   Procedure: BIOPSY;  Surgeon: Eloise Harman, DO;  Location: [facility];  Service: Endoscopy;;  . BREAST SURGERY    . COLONOSCOPY WITH PROPOFOL N/A 02/19/2020   non-bleeding internal hemorrhoids, many small-mouthed diverticula in entire colon.  . ESOPHAGOGASTRODUODENOSCOPY (EGD) WITH PROPOFOL N/A 02/19/2020    benign-appearing esophageal stenosis s/p dilation, gastritis s/p biopsy, normal duodenum. Negative H.pylori, negative duodenal biopsy.   . THYROIDECTOMY    . tummy tuck     at time of breast surgery 2003    Current Outpatient Medications  Medication Sig Dispense Refill  . acetaminophen (TYLENOL) 500 MG tablet Take 1,000 mg by mouth as needed for moderate pain.     Marland Kitchen amitriptyline (ELAVIL) 25 MG tablet Take 25 mg by mouth at bedtime.    Marland Kitchen aspirin EC 81 MG tablet Take 81 mg by mouth every morning. Swallow whole.    . beclomethasone (QVAR) 80 MCG/ACT inhaler Inhale 1 puff into the lungs 2 (two) times daily as needed (shortness of breath).    . calcium-vitamin D (OSCAL WITH D) 500-200 MG-UNIT tablet Take 1 tablet by mouth 3 (three) times daily.    . Dulaglutide (TRULICITY) 1.5 EZ/6.6QH SOPN Inject 1.5 mg into the skin every Monday.    . furosemide (LASIX) 40 MG tablet Take 40 mg by mouth daily.    Marland Kitchen  glipiZIDE (GLUCOTROL XL) 10 MG 24 hr tablet Take 10 mg by mouth daily with breakfast.    . GLUCOSAMINE-CHONDROITIN PO Take 1 tablet by mouth daily.    Marland Kitchen lidocaine (LIDODERM) 5 % Place 1 patch onto the skin daily as needed (pain). Remove & Discard patch within 12 hours or as directed by MD. Applied to lower back/shoulders/neck for pain     . losartan (COZAAR) 50 MG tablet Take 100 mg by mouth daily.    . magnesium oxide (MAG-OX) 400 MG tablet Take 400 mg by mouth daily.    . meclizine (ANTIVERT) 25 MG tablet  Take 25 mg by mouth 3 (three) times daily as needed for dizziness.    . metFORMIN (GLUCOPHAGE) 1000 MG tablet Take 1,000 mg by mouth daily.    . metoprolol tartrate (LOPRESSOR) 50 MG tablet Take 50 mg by mouth 2 (two) times daily.    . Naphazoline HCl (CLEAR EYES OP) Place 1 drop into both eyes daily as needed (dry eyes).    Marland Kitchen omeprazole (PRILOSEC) 40 MG capsule Take 1 capsule (40 mg total) by mouth in the morning and at bedtime. 60 capsule 5  . polyethylene glycol (MIRALAX / GLYCOLAX) 17 g packet Take 17 g by mouth daily as needed for moderate constipation.    . rosuvastatin (CRESTOR) 10 MG tablet Take 10 mg by mouth at bedtime.     . sucralfate (CARAFATE) 1 GM/10ML suspension Take 1.5 g by mouth 3 (three) times daily as needed (indigestion).     . verapamil (VERELAN PM) 180 MG 24 hr capsule Take 180 mg by mouth daily.    . vitamin B-12 (CYANOCOBALAMIN) 1000 MCG tablet Take 1,000 mcg by mouth 2 (two) times daily.    Marland Kitchen linaclotide (LINZESS) 72 MCG capsule Take 1 capsule (72 mcg total) by mouth daily before breakfast. 90 capsule 3  . pantoprazole (PROTONIX) 40 MG tablet Take 1 tablet (40 mg total) by mouth daily. 30 minutes before breakfast 90 tablet 3   No current facility-administered medications for this visit.    Allergies as of 04/09/2020 - Review Complete 04/09/2020  Allergen Reaction Noted  . Codeine Nausea And Vomiting 01/05/2020  . Lisinopril Nausea And Vomiting 01/05/2020  . Penicillins Hives 01/05/2020    Family History  Problem Relation Age of Onset  . Colon cancer Neg Hx     Social History   Socioeconomic History  . Marital status: Divorced    Spouse name: Not on file  . Number of children: Not on file  . Years of education: Not on file  . Highest education level: Not on file  Occupational History  . 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 Smoking status: Never Smoker  . Smokeless tobacco: Never Used  Substance and  Sexual Activity  . Alcohol use: Not Currently  . Drug use: Not Currently  . Sexual activity: Not on file  Other Topics Concern  . Not on file  Social History Narrative  . Not on file   Social Determinants of Health   Financial Resource Strain:   . Difficulty of Paying Living Expenses: Not on file  Food Insecurity:   . Worried About Charity fundraiser in the Last Year: Not on file  . Ran Out of Food in the Last Year: Not on file  Transportation Needs:   . Lack of Transportation (Medical): Not on file  . Lack of Transportation (Non-Medical): Not on  file  Physical Activity:   . Days of Exercise per Week: Not on file  . Minutes of Exercise per Session: Not on file  Stress:   . Feeling of Stress : Not on file  Social Connections:   . Frequency of Communication with Friends and Family: Not on file  . Frequency of Social Gatherings with Friends and Family: Not on file  . Attends Religious Services: Not on file  . Active Member of Clubs or Organizations: Not on file  . Attends Archivist Meetings: Not on file  . Marital Status: Not on file    Review of Systems: See HPI  Physical Exam: BP (!) 194/93   Pulse 74   Temp (!) 97 F (36.1 C) (Oral)   Ht 5\' 3"  (1.6 m)   Wt 170 lb 3.2 oz (77.2 kg)   BMI 30.15 kg/m  General:   Alert and oriented. No distress noted. Pleasant and cooperative.  Head:  Normocephalic and atraumatic. Eyes:  Conjuctiva clear without scleral icterus. Mouth:  Mask in place Abdomen:  +BS, soft, non-tender and non-distended. No rebound or guarding. Limited exam with patient sitting in chair.  Msk:  Symmetrical without gross deformities. Normal posture. Extremities:  Without edema. Neurologic:  Alert and  oriented x4 Psych:  Alert and cooperative. Normal mood and affect.  ASSESSMENT/PLAN: Stephanie Moreno is a 74 y.o. female presenting today with a long-standing history of abdominal pain and nausea, constipation, previously established with GI in  [place] but moved to this area in June 2021. Colonoscopy and EGD completed in interim from last visit with internal hemorrhoids on colonoscopy and benign-appearing esophageal stenosis s/p dilation, gastritis s/p biopsy, normal duodenum. Negative H.pylori, negative duodenal biopsy. Here for follow-up.   Constipation: Linzess 72 mcg worked well, but she has run out. Will send this to pharmacy to start daily. Call with progress report.  Epigastric pain reported: worsened with omeprazole. Nausea associated. Will stop omeprazole and start Protonix. Weight loss concerning with 10 lbs down since July 2021. Appetite decreased. Does not appear biliary related but gallbladder does remain in situ. CT and MRCP on file (MRCP due to CBD dilation) and no concerning features. Mildly elevated AST persistently. Negative Hep B surface antigen and Hep C antibody, no iron overload. May need US abdomen. Call if no improvement with Protonix. May need CTA.   Rechecking HFP today.   Progress report in 2 weeks  Return to office in 6-8 weeks.  Annitta Needs, PhD, ANP-BC [facility]

## 2020-04-09 NOTE — Patient Instructions (Addendum)
Let's stop omeprazole. Start Protonix once each morning, 30 minutes before breakfast.   I have sent Linzess into the pharmacy to take on an empty stomach 30 minutes before breakfast daily. This is for constipation.  Please have blood work completed. We are rechecking your liver numbers.  Let me know in about 2 weeks how you are doing.  I would like to see you back in 6-8 weeks to keep a close eye on you!  I enjoyed seeing you again today! As you know, I value our relationship and want to provide genuine, compassionate, and quality care. I welcome your feedback. If you receive a survey regarding your visit,  I greatly appreciate you taking time to fill this out. See you next time!  Annitta Needs, PhD, ANP-BC [facility]

## 2020-04-12 ENCOUNTER — Other Ambulatory Visit (HOSPITAL_COMMUNITY)
Admission: RE | Admit: 2020-04-12 | Discharge: 2020-04-12 | Disposition: A | Discharge status: Home / Self Care | Payer: Medicare HMO | Source: Ambulatory Visit | Attending: Gastroenterology | Admitting: Gastroenterology

## 2020-04-12 ENCOUNTER — Other Ambulatory Visit: Payer: Self-pay

## 2020-04-12 ENCOUNTER — Ambulatory Visit: Payer: Self-pay | Admitting: "Endocrinology

## 2020-04-12 DIAGNOSIS — R1013 Epigastric pain: Secondary | ICD-10-CM | POA: Insufficient documentation

## 2020-04-12 LAB — HEPATIC FUNCTION PANEL
ALT: 34 U/L (ref 0–44)
AST: 52 U/L — ABNORMAL HIGH (ref 15–41)
Albumin: 3.6 g/dL (ref 3.5–5.0)
Alkaline Phosphatase: 76 U/L (ref 38–126)
Bilirubin, Direct: 0.1 mg/dL (ref 0.0–0.2)
Indirect Bilirubin: 0.5 mg/dL (ref 0.3–0.9)
Total Bilirubin: 0.6 mg/dL (ref 0.3–1.2)
Total Protein: 8.6 g/dL — ABNORMAL HIGH (ref 6.5–8.1)

## 2020-04-15 ENCOUNTER — Ambulatory Visit (INDEPENDENT_AMBULATORY_CARE_PROVIDER_SITE_OTHER): Payer: Medicare HMO | Admitting: "Endocrinology

## 2020-04-15 ENCOUNTER — Other Ambulatory Visit: Payer: Self-pay

## 2020-04-15 ENCOUNTER — Encounter: Payer: Self-pay | Admitting: "Endocrinology

## 2020-04-15 VITALS — BP 144/81 | HR 76 | Ht 63.0 in | Wt 172.4 lb

## 2020-04-15 DIAGNOSIS — E1122 Type 2 diabetes mellitus with diabetic chronic kidney disease: Secondary | ICD-10-CM | POA: Diagnosis not present

## 2020-04-15 DIAGNOSIS — N1832 Chronic kidney disease, stage 3b: Secondary | ICD-10-CM | POA: Diagnosis not present

## 2020-04-15 MED ORDER — METFORMIN HCL 500 MG PO TABS
1000.0000 mg | ORAL_TABLET | Freq: Every day | ORAL | 3 refills | Status: DC
Start: 2020-04-15 — End: 2020-07-12

## 2020-04-15 MED ORDER — GLIPIZIDE ER 5 MG PO TB24
5.0000 mg | ORAL_TABLET | Freq: Every day | ORAL | 3 refills | Status: DC
Start: 2020-04-15 — End: 2020-05-10

## 2020-04-15 MED ORDER — ACCU-CHEK GUIDE ME W/DEVICE KIT: 1.0000 | PACK | 0 refills | Status: DC

## 2020-04-15 MED ORDER — ACCU-CHEK GUIDE VI STRP: ORAL_STRIP | 2 refills | Status: DC

## 2020-04-15 NOTE — Patient Instructions (Signed)

## 2020-04-15 NOTE — Progress Notes (Signed)
CC'ED TO PCP 

## 2020-04-15 NOTE — Progress Notes (Signed)
Endocrinology Consult Note       04/15/2020, 11:34 AM   Subjective:    Patient ID: Stephanie Moreno, female    DOB: [birth year].  Stephanie Moreno is being seen in consultation for management of currently uncontrolled symptomatic diabetes requested by  Pablo Lawrence, NP.   Past Medical History:  Diagnosis Date  . Acid reflux disease   . Breast cancer ([place])   . Diabetes mellitus without complication ([place])   . Diverticulitis   . Hypertension   . Sleep apnea   . Thyroid disease     Past Surgical History:  Procedure Laterality Date  . BIOPSY  02/19/2020   Procedure: BIOPSY;  Surgeon: Eloise Harman, DO;  Location: [facility];  Service: Endoscopy;;  . BREAST SURGERY    . COLONOSCOPY WITH PROPOFOL N/A 02/19/2020   non-bleeding internal hemorrhoids, many small-mouthed diverticula in entire colon.  . ESOPHAGOGASTRODUODENOSCOPY (EGD) WITH PROPOFOL N/A 02/19/2020    benign-appearing esophageal stenosis s/p dilation, gastritis s/p biopsy, normal duodenum. Negative H.pylori, negative duodenal biopsy.   . THYROIDECTOMY    . tummy tuck     at time of breast surgery 2003    Social History   Socioeconomic History  . Marital status: Divorced    Spouse name: Not on file  . Number of children: Not on file  . Years of education: Not on file  . Highest education level: Not on file  Occupational History  . 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 Smoking status: Never Smoker  . Smokeless tobacco: Never Used  Substance and Sexual Activity  . Alcohol use: Not Currently  . Drug use: Not Currently  . Sexual activity: Not on file  Other Topics Concern  . Not on file  Social History Narrative  . Not on file   Social Determinants of Health   Financial Resource Strain:   . Difficulty of Paying Living Expenses: Not on file  Food Insecurity:   . Worried About  Charity fundraiser in the Last Year: Not on file  . Ran Out of Food in the Last Year: Not on file  Transportation Needs:   . Lack of Transportation (Medical): Not on file  . Lack of Transportation (Non-Medical): Not on file  Physical Activity:   . Days of Exercise per Week: Not on file  . Minutes of Exercise per Session: Not on file  Stress:   . Feeling of Stress : Not on file  Social Connections:   . Frequency of Communication with Friends and Family: Not on file  . Frequency of Social Gatherings with Friends and Family: Not on file  . Attends Religious Services: Not on file  . Active Member of Clubs or Organizations: Not on file  . Attends Archivist Meetings: Not on file  . Marital Status: Not on file    Family History  Problem Relation Age of Onset  . Diabetes Mother   . Thyroid disease Mother   . Colon cancer Neg Hx     Outpatient Encounter Medications as of 04/15/2020  Medication Sig  . acetaminophen (TYLENOL) 500 MG tablet Take 1,000 mg by mouth as needed for moderate pain.   Marland Kitchen amitriptyline (ELAVIL) 25 MG tablet Take 25 mg by mouth at bedtime.  Marland Kitchen aspirin EC 81 MG tablet Take 81 mg by mouth every morning. Swallow whole.  . beclomethasone (QVAR) 80 MCG/ACT inhaler Inhale 1 puff into the lungs 2 (two) times daily as needed (shortness of breath).  . Blood Glucose Monitoring Suppl (ACCU-CHEK GUIDE ME) w/Device KIT 1 Piece by Does not apply route as directed.  . calcium-vitamin D (OSCAL WITH D) 500-200 MG-UNIT tablet Take 1 tablet by mouth 3 (three) times daily.  . Dulaglutide (TRULICITY) 1.5 EX/5.1ZG SOPN Inject 1.5 mg into the skin every Monday.  . furosemide (LASIX) 40 MG tablet Take 40 mg by mouth daily.  Marland Kitchen glipiZIDE (GLUCOTROL XL) 5 MG 24 hr tablet Take 1 tablet (5 mg total) by mouth daily with breakfast.  . GLUCOSAMINE-CHONDROITIN PO Take 1 tablet by mouth daily.  Marland Kitchen glucose blood (ACCU-CHEK GUIDE) test strip Use as instructed  . lidocaine (LIDODERM) 5 % Place  1 patch onto the skin daily as needed (pain). Remove & Discard patch within 12 hours or as directed by MD. Applied to lower back/shoulders/neck for pain   . linaclotide (LINZESS) 72 MCG capsule Take 1 capsule (72 mcg total) by mouth daily before breakfast.  . losartan (COZAAR) 50 MG tablet Take 100 mg by mouth daily.  . magnesium oxide (MAG-OX) 400 MG tablet Take 400 mg by mouth daily.  . meclizine (ANTIVERT) 25 MG tablet Take 25 mg by mouth 3 (three) times daily as needed for dizziness.  . metFORMIN (GLUCOPHAGE) 500 MG tablet Take 2 tablets (1,000 mg total) by mouth daily with breakfast.  . metoprolol tartrate (LOPRESSOR) 50 MG tablet Take 50 mg by mouth 2 (two) times daily.  . Naphazoline HCl (CLEAR EYES OP) Place 1 drop into both eyes daily as needed (dry eyes).  . pantoprazole (PROTONIX) 40 MG tablet Take 1 tablet (40 mg total) by mouth daily. 30 minutes before breakfast  . polyethylene glycol (MIRALAX / GLYCOLAX) 17 g packet Take 17 g by mouth daily as needed for moderate constipation.  . rosuvastatin (CRESTOR) 10 MG tablet Take 10 mg by mouth at bedtime.   . sucralfate (CARAFATE) 1 GM/10ML suspension Take 1.5 g by mouth 3 (three) times daily as needed (indigestion).   . verapamil (VERELAN PM) 180 MG 24 hr capsule Take 180 mg by mouth daily.  . [DISCONTINUED] glipiZIDE (GLUCOTROL XL) 10 MG 24 hr tablet Take 10 mg by mouth daily with breakfast.  . [DISCONTINUED] metFORMIN (GLUCOPHAGE) 1000 MG tablet Take 1,000 mg by mouth daily.  . [DISCONTINUED] omeprazole (PRILOSEC) 40 MG capsule Take 1 capsule (40 mg total) by mouth in the morning and at bedtime.  . [DISCONTINUED] vitamin B-12 (CYANOCOBALAMIN) 1000 MCG tablet Take 1,000 mcg by mouth 2 (two) times daily.   No facility-administered encounter medications on file as of 04/15/2020.    ALLERGIES: Allergies  Allergen Reactions  . Codeine Nausea And Vomiting  . Lisinopril Nausea And Vomiting  . Penicillins Hives    VACCINATION  STATUS:  There is no immunization history on file for this patient.  Diabetes She presents for her initial diabetic visit. She has type 2 diabetes mellitus. Onset time: She was diagnosed at approximate age of 74 years. Her disease course has been fluctuating. There are no hypoglycemic associated symptoms. Pertinent negatives for hypoglycemia include no confusion, headaches, pallor or  seizures. Associated symptoms include fatigue, foot paresthesias, polydipsia and polyuria. Pertinent negatives for diabetes include no chest pain and no polyphagia. There are no hypoglycemic complications. Symptoms are worsening. Diabetic complications include a CVA and nephropathy. Risk factors for coronary artery disease include diabetes mellitus, dyslipidemia, family history, obesity, hypertension, sedentary lifestyle and post-menopausal. Current diabetic treatments: She is currently on Trulicity 1.5 mg weekly, glipizide 10 mg p.o. daily, Metformin 1000 mg p.o. daily. She is following a generally unhealthy diet. When asked about meal planning, she reported none. She has not had a previous visit with a dietitian. She never participates in exercise. (She admits that she does not have a meter to check blood glucose twice.  Her recent A1c was 8.8%.) An ACE inhibitor/angiotensin II receptor blocker is being taken.  Hyperlipidemia This is a chronic problem. The current episode started more than 1 year ago. The problem is uncontrolled. Exacerbating diseases include chronic renal disease, diabetes and obesity. Pertinent negatives include no chest pain, myalgias or shortness of breath. Current antihyperlipidemic treatment includes statins. Risk factors for coronary artery disease include diabetes mellitus, dyslipidemia, hypertension, a sedentary lifestyle, post-menopausal and family history.  Hypertension This is a chronic problem. The current episode started more than 1 year ago. The problem is uncontrolled. Pertinent negatives  include no chest pain, headaches, palpitations or shortness of breath. Risk factors for coronary artery disease include diabetes mellitus, dyslipidemia, obesity, sedentary lifestyle and post-menopausal state. Past treatments include angiotensin blockers. Hypertensive end-organ damage includes kidney disease and CVA. Identifiable causes of hypertension include chronic renal disease.     Review of Systems  Constitutional: Positive for fatigue. Negative for chills, fever and unexpected weight change.  HENT: Negative for trouble swallowing and voice change.   Eyes: Negative for visual disturbance.  Respiratory: Negative for cough, shortness of breath and wheezing.   Cardiovascular: Negative for chest pain, palpitations and leg swelling.  Gastrointestinal: Negative for diarrhea, nausea and vomiting.  Endocrine: Positive for polydipsia and polyuria. Negative for cold intolerance, heat intolerance and polyphagia.  Musculoskeletal: Positive for gait problem. Negative for arthralgias and myalgias.       Patient uses a walker to ambulate due to disequilibrium.  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  Skin: Negative for color change, pallor, rash and wound.  Neurological: Negative for seizures and headaches.  Psychiatric/Behavioral: Negative for confusion and suicidal ideas.    Objective:    Vitals with BMI 04/15/2020 04/09/2020 03/01/2020  Height 5' 3"  5' 3"  5' 3"   Weight 172 lbs 6 oz 170 lbs 3 oz 178 lbs 9 oz  BMI 30.55 08.65 78.46  Systolic 962 952 841  Diastolic 81 93 324  Pulse 76 74 96    BP (!) 144/81   Pulse 76   Ht 5' 3"  (1.6 m)   Wt 172 lb 6.4 oz (78.2 kg)   BMI 30.54 kg/m   Wt Readings from Last 3 Encounters:  04/15/20 172 lb 6.4 oz (78.2 kg)  04/09/20 170 lb 3.2 oz (77.2 kg)  03/01/20 178 lb 9.2 oz (81 kg)     Physical Exam Constitutional:      Appearance: She is well-developed.  HENT:     Head: Normocephalic and atraumatic.  Neck:     Thyroid: No  thyromegaly.     Trachea: No tracheal deviation.  Cardiovascular:     Rate and Rhythm: Normal rate and regular rhythm.  Pulmonary:     Effort: Pulmonary effort is normal.  Abdominal:  Tenderness: There is no abdominal tenderness. There is no guarding.  Musculoskeletal:        General: Normal range of motion.     Cervical back: Normal range of motion and neck supple.     Comments: Patient uses a walker to ambulate due to disequilibrium.  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  Skin:    General: Skin is warm and dry.     Coloration: Skin is not pale.     Findings: No erythema or rash.  Neurological:     Mental Status: She is alert and oriented to person, place, and time.     Cranial Nerves: No cranial nerve deficit.     Coordination: Coordination normal.     Deep Tendon Reflexes: Reflexes are normal and symmetric.  Psychiatric:        Judgment: Judgment normal.       CMP ( most recent) CMP     Component Value Date/Time   NA 137 01/28/2020 1257   K 3.7 01/28/2020 1257   CL 103 01/28/2020 1257   CO2 25 01/28/2020 1257   GLUCOSE 129 (H) 01/28/2020 1257   BUN 9 03/09/2020 0000   CREATININE 1.5 (A) 03/09/2020 0000   CREATININE 1.10 (H) 01/28/2020 1257   CALCIUM 9.0 01/28/2020 1257   PROT 8.6 (H) 04/12/2020 1423   ALBUMIN 3.6 04/12/2020 1423   AST 52 (H) 04/12/2020 1423   ALT 34 04/12/2020 1423   ALKPHOS 76 04/12/2020 1423   BILITOT 0.6 04/12/2020 1423   GFRNONAA 35 03/09/2020 0000   GFRAA 40 03/09/2020 0000     Diabetic Labs (most recent): Lab Results  Component Value Date   HGBA1C 8.8 03/09/2020     Lipid Panel ( most recent) Lipid Panel     Component Value Date/Time   CHOL 186 03/09/2020 0000   TRIG 110 03/09/2020 0000   HDL 57 03/09/2020 0000   LDLCALC 109 03/09/2020 0000      Lab Results  Component Value Date   TSH 1.18 03/09/2020      Assessment & Plan:   1. Type 2 diabetes mellitus with stage 3b chronic kidney  disease, without long-term current use of insulin ([place])  - Stephanie Moreno has currently uncontrolled symptomatic type 2 DM since  74 years of age,  with most recent A1c of 8.8 %. Recent labs reviewed.  She does not have a functioning meter.  Did not bring any logs.  - I had a long discussion with her about the progressive nature of diabetes and the pathology behind its complications. -her diabetes is complicated by CKD, CVA and she remains at a high risk for more acute and chronic complications which include CAD, CVA, CKD, retinopathy, and neuropathy. These are all discussed in detail with her.  - I have counseled her on diet  and weight management  by adopting a carbohydrate restricted/protein rich diet. Patient is encouraged to switch to  unprocessed or minimally processed     complex starch and increased protein intake (animal or plant source), fruits, and vegetables. -  she is advised to stick to a routine mealtimes to eat 3 meals  a day and avoid unnecessary snacks ( to snack only to correct hypoglycemia).   - she admits that there is a room for improvement in her food and drink choices. - Suggestion is made for her to avoid simple carbohydrates  from her diet including Cakes, Sweet Desserts, Ice Cream, Soda (diet and  regular), Sweet Tea, Candies, Chips, Cookies, Store Bought Juices, Alcohol in Excess of  1-2 drinks a day, Artificial Sweeteners,  Coffee Creamer, and "Sugar-free" Products. This will help patient to have more stable blood glucose profile and potentially avoid unintended weight gain.  - she will be scheduled with Jearld Fenton, RDN, CDE for diabetes education.  - I have approached her with the following individualized plan to manage  her diabetes and patient agrees:   - she will likely require at least basal insulin in order for her to achieve control of diabetes to target. -In preparation, she is approached to start monitoring blood glucose  4 times a day-before meals and at  bedtime, and return in 10-15 days for reevaluation.  - she is encouraged to call clinic for blood glucose levels less than 70 or above 300 mg /dl. - she is advised to continue Trulicity 1.5 mg subcutaneously weekly, therapeutically suitable for patient . - her Metformin will be lowered to 500 mg p.o. daily. -Her glipizide will also be lowered to 5 mg XL p.o. daily at breakfast. - Specific targets for  A1c;  LDL, HDL,  and Triglycerides were discussed with the patient.  2) Blood Pressure /Hypertension:  her blood pressure is not controlled to target.   she is advised to continue her current medications including losartan 100 mg p.o. daily with breakfast . 3) Lipids/Hyperlipidemia:   Review of her recent lipid panel showed uncontrolled  LDL at 109 .  she  is advised to continue    Crestor 10 mg daily at bedtime.  Side effects and precautions discussed with her.  4)  Weight/Diet:  Body mass index is 30.54 kg/m.  -   clearly complicating her diabetes care.   she is  a candidate for weight loss. I discussed with her the fact that loss of 5 - 10% of her  current body weight will have the most impact on her diabetes management.  Exercise, and detailed carbohydrates information provided  -  detailed on discharge instructions.  5) Chronic Care/Health Maintenance:  -she  is on ACEI/ARB and Statin medications and  is encouraged to initiate and continue to follow up with Ophthalmology, Dentist,  Podiatrist at least yearly or according to recommendations, and advised to  stay away from smoking. I have recommended yearly flu vaccine and pneumonia vaccine at least every 5 years; she cannot exercise optimally due to her disequilibrium.  She is advised to sleep for at least 7 hours a day.  - she is  advised to maintain close follow up with Pablo Lawrence, NP for primary care needs, as well as her other providers for optimal and coordinated care.   - Time spent in this patient care: 60 min, of which > 50% was  spent in  counseling  her about her currently uncontrolled, complicated type 2 diabetes; hyperlipidemia; hypertension and the rest reviewing her blood glucose logs , discussing her hypoglycemia and hyperglycemia episodes, reviewing her current and  previous labs / studies  ( including abstraction from other facilities) and medications  doses and developing a  long term treatment plan based on the latest standards of care/ guidelines; and documenting her care.    Please refer to Patient Instructions for Blood Glucose Monitoring and Insulin/Medications Dosing Guide"  in media tab for additional information. Please  also refer to " Patient Self Inventory" in the Media  tab for reviewed elements of pertinent patient history.  Stephanie Moreno participated in the discussions, expressed understanding,  and voiced agreement with the above plans.  All questions were answered to her satisfaction. she is encouraged to contact clinic should she have any questions or concerns prior to her return visit.   Follow up plan: - Return in about 10 days (around 04/25/2020) for F/U with Meter and Logs Only - no Labs, ABI in Office [place].  Glade Lloyd, MD [facility] [address] [place], [place] [address] Phone: [phone]  Fax: [phone]    04/15/2020, 11:34 AM  This note was partially dictated with voice recognition software. Similar sounding words can be transcribed inadequately or may not  be corrected upon review.

## 2020-04-25 DIAGNOSIS — N390 Urinary tract infection, site not specified: Secondary | ICD-10-CM | POA: Diagnosis not present

## 2020-04-27 DIAGNOSIS — D649 Anemia, unspecified: Secondary | ICD-10-CM | POA: Diagnosis not present

## 2020-04-28 LAB — HEPATIC FUNCTION PANEL
AG Ratio: 1.1 (calc) (ref 1.0–2.5)
ALT: 26 U/L (ref 6–29)
AST: 38 U/L — ABNORMAL HIGH (ref 10–35)
Albumin: 4.2 g/dL (ref 3.6–5.1)
Alkaline phosphatase (APISO): 98 U/L (ref 37–153)
Bilirubin, Direct: 0.1 mg/dL (ref 0.0–0.2)
Globulin: 3.9 g/dL (calc) — ABNORMAL HIGH (ref 1.9–3.7)
Indirect Bilirubin: 0.2 mg/dL (calc) (ref 0.2–1.2)
Total Bilirubin: 0.3 mg/dL (ref 0.2–1.2)
Total Protein: 8.1 g/dL (ref 6.1–8.1)

## 2020-04-29 ENCOUNTER — Ambulatory Visit: Payer: Medicare HMO | Admitting: "Endocrinology

## 2020-04-29 DIAGNOSIS — E1122 Type 2 diabetes mellitus with diabetic chronic kidney disease: Secondary | ICD-10-CM | POA: Diagnosis not present

## 2020-04-29 DIAGNOSIS — N170 Acute kidney failure with tubular necrosis: Secondary | ICD-10-CM | POA: Diagnosis not present

## 2020-04-29 DIAGNOSIS — N189 Chronic kidney disease, unspecified: Secondary | ICD-10-CM | POA: Diagnosis not present

## 2020-04-29 DIAGNOSIS — D638 Anemia in other chronic diseases classified elsewhere: Secondary | ICD-10-CM | POA: Diagnosis not present

## 2020-04-29 DIAGNOSIS — Z79899 Other long term (current) drug therapy: Secondary | ICD-10-CM | POA: Diagnosis not present

## 2020-04-29 DIAGNOSIS — D508 Other iron deficiency anemias: Secondary | ICD-10-CM | POA: Diagnosis not present

## 2020-04-29 DIAGNOSIS — I129 Hypertensive chronic kidney disease with stage 1 through stage 4 chronic kidney disease, or unspecified chronic kidney disease: Secondary | ICD-10-CM | POA: Diagnosis not present

## 2020-04-29 DIAGNOSIS — N281 Cyst of kidney, acquired: Secondary | ICD-10-CM | POA: Diagnosis not present

## 2020-04-29 DIAGNOSIS — E1129 Type 2 diabetes mellitus with other diabetic kidney complication: Secondary | ICD-10-CM | POA: Diagnosis not present

## 2020-04-29 DIAGNOSIS — R809 Proteinuria, unspecified: Secondary | ICD-10-CM | POA: Diagnosis not present

## 2020-04-30 DIAGNOSIS — N1831 Chronic kidney disease, stage 3a: Secondary | ICD-10-CM | POA: Diagnosis not present

## 2020-04-30 DIAGNOSIS — E7849 Other hyperlipidemia: Secondary | ICD-10-CM | POA: Diagnosis not present

## 2020-04-30 DIAGNOSIS — E1122 Type 2 diabetes mellitus with diabetic chronic kidney disease: Secondary | ICD-10-CM | POA: Diagnosis not present

## 2020-04-30 DIAGNOSIS — I129 Hypertensive chronic kidney disease with stage 1 through stage 4 chronic kidney disease, or unspecified chronic kidney disease: Secondary | ICD-10-CM | POA: Diagnosis not present

## 2020-05-03 DIAGNOSIS — D508 Other iron deficiency anemias: Secondary | ICD-10-CM | POA: Diagnosis not present

## 2020-05-03 DIAGNOSIS — E1122 Type 2 diabetes mellitus with diabetic chronic kidney disease: Secondary | ICD-10-CM | POA: Diagnosis not present

## 2020-05-03 DIAGNOSIS — Z79899 Other long term (current) drug therapy: Secondary | ICD-10-CM | POA: Diagnosis not present

## 2020-05-03 DIAGNOSIS — N189 Chronic kidney disease, unspecified: Secondary | ICD-10-CM | POA: Diagnosis not present

## 2020-05-03 DIAGNOSIS — I129 Hypertensive chronic kidney disease with stage 1 through stage 4 chronic kidney disease, or unspecified chronic kidney disease: Secondary | ICD-10-CM | POA: Diagnosis not present

## 2020-05-03 DIAGNOSIS — N170 Acute kidney failure with tubular necrosis: Secondary | ICD-10-CM | POA: Diagnosis not present

## 2020-05-03 DIAGNOSIS — D638 Anemia in other chronic diseases classified elsewhere: Secondary | ICD-10-CM | POA: Diagnosis not present

## 2020-05-03 DIAGNOSIS — E559 Vitamin D deficiency, unspecified: Secondary | ICD-10-CM | POA: Diagnosis not present

## 2020-05-03 DIAGNOSIS — N281 Cyst of kidney, acquired: Secondary | ICD-10-CM | POA: Diagnosis not present

## 2020-05-04 ENCOUNTER — Other Ambulatory Visit: Payer: Self-pay

## 2020-05-04 DIAGNOSIS — K760 Fatty (change of) liver, not elsewhere classified: Secondary | ICD-10-CM

## 2020-05-10 ENCOUNTER — Ambulatory Visit (INDEPENDENT_AMBULATORY_CARE_PROVIDER_SITE_OTHER): Payer: Medicare HMO | Admitting: "Endocrinology

## 2020-05-10 ENCOUNTER — Encounter: Payer: Self-pay | Admitting: "Endocrinology

## 2020-05-10 ENCOUNTER — Other Ambulatory Visit: Payer: Self-pay

## 2020-05-10 VITALS — BP 128/70 | HR 76 | Ht 63.0 in | Wt 178.4 lb

## 2020-05-10 DIAGNOSIS — N1832 Chronic kidney disease, stage 3b: Secondary | ICD-10-CM | POA: Diagnosis not present

## 2020-05-10 DIAGNOSIS — E1122 Type 2 diabetes mellitus with diabetic chronic kidney disease: Secondary | ICD-10-CM | POA: Diagnosis not present

## 2020-05-10 MED ORDER — GLIPIZIDE ER 5 MG PO TB24
10.0000 mg | ORAL_TABLET | Freq: Every day | ORAL | 3 refills | Status: DC
Start: 2020-05-10 — End: 2020-07-12

## 2020-05-10 NOTE — Patient Instructions (Signed)

## 2020-05-10 NOTE — Progress Notes (Signed)
05/10/2020, 3:20 PM  Endocrinology follow-up note   Subjective:    Patient ID: Stephanie Moreno, female    DOB: [birth year].  Stephanie Moreno is being seen in follow-up after she was seen in consultation for management of currently uncontrolled symptomatic diabetes requested by  Pablo Lawrence, NP.   Past Medical History:  Diagnosis Date  . Acid reflux disease   . Breast cancer ([place])   . Diabetes mellitus without complication ([place])   . Diverticulitis   . Hypertension   . Sleep apnea   . Thyroid disease     Past Surgical History:  Procedure Laterality Date  . BIOPSY  02/19/2020   Procedure: BIOPSY;  Surgeon: Eloise Harman, DO;  Location: [facility];  Service: Endoscopy;;  . BREAST SURGERY    . COLONOSCOPY WITH PROPOFOL N/A 02/19/2020   non-bleeding internal hemorrhoids, many small-mouthed diverticula in entire colon.  . ESOPHAGOGASTRODUODENOSCOPY (EGD) WITH PROPOFOL N/A 02/19/2020    benign-appearing esophageal stenosis s/p dilation, gastritis s/p biopsy, normal duodenum. Negative H.pylori, negative duodenal biopsy.   . THYROIDECTOMY    . tummy tuck     at time of breast surgery 2003    Social History   Socioeconomic History  . Marital status: Divorced    Spouse name: Not on file  . Number of children: Not on file  . Years of education: Not on file  . Highest education level: Not on file  Occupational History  . 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 Smoking status: Never Smoker  . Smokeless tobacco: Never Used  Substance and Sexual Activity  . Alcohol use: Not Currently  . Drug use: Not Currently  . Sexual activity: Not on file  Other Topics Concern  . Not on file  Social History Narrative  . Not on file   Social Determinants of Health   Financial Resource Strain:   . Difficulty of Paying Living Expenses: Not on file  Food Insecurity:   . Worried About Charity fundraiser in the Last  Year: Not on file  . Ran Out of Food in the Last Year: Not on file  Transportation Needs:   . Lack of Transportation (Medical): Not on file  . Lack of Transportation (Non-Medical): Not on file  Physical Activity:   . Days of Exercise per Week: Not on file  . Minutes of Exercise per Session: Not on file  Stress:   . Feeling of Stress : Not on file  Social Connections:   . Frequency of Communication with Friends and Family: Not on file  . Frequency of Social Gatherings with Friends and Family: Not on file  . Attends Religious Services: Not on file  . Active Member of Clubs or Organizations: Not on file  . Attends Archivist Meetings: Not on file  . Marital Status: Not on file    Family History  Problem Relation Age of Onset  . Diabetes Mother   . Thyroid disease Mother   . Colon cancer Neg Hx     Outpatient Encounter Medications as of 05/10/2020  Medication Sig  . acetaminophen (TYLENOL) 500 MG tablet Take 1,000 mg by mouth as needed for moderate pain.   Marland Kitchen amitriptyline (ELAVIL) 25 MG tablet Take 25 mg by mouth at bedtime.  Marland Kitchen aspirin EC 81 MG tablet Take 81 mg by mouth every morning. Swallow whole.  . beclomethasone (QVAR)  80 MCG/ACT inhaler Inhale 1 puff into the lungs 2 (two) times daily as needed (shortness of breath).  . Blood Glucose Monitoring Suppl (ACCU-CHEK GUIDE ME) w/Device KIT 1 Piece by Does not apply route as directed.  . calcium-vitamin D (OSCAL WITH D) 500-200 MG-UNIT tablet Take 1 tablet by mouth 3 (three) times daily.  . Dulaglutide (TRULICITY) 1.5 DZ/3.2DJ SOPN Inject 1.5 mg into the skin every Monday.  . furosemide (LASIX) 40 MG tablet Take 40 mg by mouth daily.  Marland Kitchen glipiZIDE (GLUCOTROL XL) 5 MG 24 hr tablet Take 2 tablets (10 mg total) by mouth daily with breakfast.  . GLUCOSAMINE-CHONDROITIN PO Take 1 tablet by mouth daily.  Marland Kitchen glucose blood (ACCU-CHEK GUIDE) test strip Use as instructed  . lidocaine (LIDODERM) 5 % Place 1 patch onto the skin daily as  needed (pain). Remove & Discard patch within 12 hours or as directed by MD. Applied to lower back/shoulders/neck for pain   . linaclotide (LINZESS) 72 MCG capsule Take 1 capsule (72 mcg total) by mouth daily before breakfast.  . losartan (COZAAR) 50 MG tablet Take 100 mg by mouth daily.  . magnesium oxide (MAG-OX) 400 MG tablet Take 400 mg by mouth daily.  . meclizine (ANTIVERT) 25 MG tablet Take 25 mg by mouth 3 (three) times daily as needed for dizziness.  . metFORMIN (GLUCOPHAGE) 500 MG tablet Take 2 tablets (1,000 mg total) by mouth daily with breakfast.  . metoprolol tartrate (LOPRESSOR) 50 MG tablet Take 50 mg by mouth 2 (two) times daily.  . Naphazoline HCl (CLEAR EYES OP) Place 1 drop into both eyes daily as needed (dry eyes).  . pantoprazole (PROTONIX) 40 MG tablet Take 1 tablet (40 mg total) by mouth daily. 30 minutes before breakfast  . polyethylene glycol (MIRALAX / GLYCOLAX) 17 g packet Take 17 g by mouth daily as needed for moderate constipation.  . rosuvastatin (CRESTOR) 10 MG tablet Take 10 mg by mouth at bedtime.   . sucralfate (CARAFATE) 1 GM/10ML suspension Take 1.5 g by mouth 3 (three) times daily as needed (indigestion).   . verapamil (VERELAN PM) 180 MG 24 hr capsule Take 180 mg by mouth daily.  . [DISCONTINUED] glipiZIDE (GLUCOTROL XL) 5 MG 24 hr tablet Take 1 tablet (5 mg total) by mouth daily with breakfast.   No facility-administered encounter medications on file as of 05/10/2020.    ALLERGIES: Allergies  Allergen Reactions  . Codeine Nausea And Vomiting  . Lisinopril Nausea And Vomiting  . Penicillins Hives    VACCINATION STATUS:  There is no immunization history on file for this patient.  Diabetes She presents for her follow-up diabetic visit. She has type 2 diabetes mellitus. Onset time: She was diagnosed at approximate age of 74 years. Her disease course has been worsening. There are no hypoglycemic associated symptoms. Pertinent negatives for hypoglycemia  include no confusion, headaches, pallor or seizures. Associated symptoms include fatigue, foot paresthesias, polydipsia and polyuria. Pertinent negatives for diabetes include no chest pain and no polyphagia. There are no hypoglycemic complications. Symptoms are improving. Diabetic complications include a CVA and nephropathy. Risk factors for coronary artery disease include diabetes mellitus, dyslipidemia, family history, obesity, hypertension, sedentary lifestyle and post-menopausal. Current diabetic treatments: She is currently on Trulicity 1.5 mg weekly, glipizide 10 mg p.o. daily, Metformin 1000 mg p.o. daily. Her weight is increasing steadily. She is following a generally unhealthy diet. When asked about meal planning, she reported none. She has not had a previous visit with a dietitian.  She never participates in exercise. Her home blood glucose trend is fluctuating minimally. Her breakfast blood glucose range is generally 140-180 mg/dl. Her lunch blood glucose range is generally >200 mg/dl. Her dinner blood glucose range is generally >200 mg/dl. Her bedtime blood glucose range is generally >200 mg/dl. Her overall blood glucose range is >200 mg/dl. (She returns with some improvement in her glycemic profile, she wishes to avoid insulin treatment, her recent A1c was 8.8%.   No hypoglycemia was documented.) An ACE inhibitor/angiotensin II receptor blocker is being taken.  Hyperlipidemia This is a chronic problem. The current episode started more than 1 year ago. The problem is uncontrolled. Exacerbating diseases include chronic renal disease, diabetes and obesity. Pertinent negatives include no chest pain, myalgias or shortness of breath. Current antihyperlipidemic treatment includes statins. Risk factors for coronary artery disease include diabetes mellitus, dyslipidemia, hypertension, a sedentary lifestyle, post-menopausal and family history.  Hypertension This is a chronic problem. The current episode  started more than 1 year ago. The problem is uncontrolled. Pertinent negatives include no chest pain, headaches, palpitations or shortness of breath. Risk factors for coronary artery disease include diabetes mellitus, dyslipidemia, obesity, sedentary lifestyle and post-menopausal state. Past treatments include angiotensin blockers. Hypertensive end-organ damage includes kidney disease and CVA. Identifiable causes of hypertension include chronic renal disease.     Review of Systems  Constitutional: Positive for fatigue. Negative for chills, fever and unexpected weight change.  HENT: Negative for trouble swallowing and voice change.   Eyes: Negative for visual disturbance.  Respiratory: Negative for cough, shortness of breath and wheezing.   Cardiovascular: Negative for chest pain, palpitations and leg swelling.  Gastrointestinal: Negative for diarrhea, nausea and vomiting.  Endocrine: Positive for polydipsia and polyuria. Negative for cold intolerance, heat intolerance and polyphagia.  Musculoskeletal: Positive for gait problem. Negative for arthralgias and myalgias.       Patient uses a walker to ambulate due to disequilibrium.  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  Skin: Negative for color change, pallor, rash and wound.  Neurological: Negative for seizures and headaches.  Psychiatric/Behavioral: Negative for confusion and suicidal ideas.    Objective:    Vitals with BMI 05/10/2020 04/15/2020 04/09/2020  Height 5' 3"  5' 3"  5' 3"   Weight 178 lbs 6 oz 172 lbs 6 oz 170 lbs 3 oz  BMI 31.61 40.81 44.81  Systolic 856 314 970  Diastolic 70 81 93  Pulse 76 76 74    BP 128/70   Pulse 76   Ht 5' 3"  (1.6 m)   Wt 178 lb 6.4 oz (80.9 kg)   BMI 31.60 kg/m   Wt Readings from Last 3 Encounters:  05/10/20 178 lb 6.4 oz (80.9 kg)  04/15/20 172 lb 6.4 oz (78.2 kg)  04/09/20 170 lb 3.2 oz (77.2 kg)     Physical Exam Constitutional:      Appearance: She is  well-developed.  HENT:     Head: Normocephalic and atraumatic.  Neck:     Thyroid: No thyromegaly.     Trachea: No tracheal deviation.  Cardiovascular:     Rate and Rhythm: Normal rate and regular rhythm.  Pulmonary:     Effort: Pulmonary effort is normal.  Abdominal:     Tenderness: There is no abdominal tenderness. There is no guarding.  Musculoskeletal:        General: Normal range of motion.     Cervical back: Normal range of motion and neck supple.  Comments: Patient uses a walker to ambulate due to disequilibrium.  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  Skin:    General: Skin is warm and dry.     Coloration: Skin is not pale.     Findings: No erythema or rash.  Neurological:     Mental Status: She is alert and oriented to person, place, and time.     Cranial Nerves: No cranial nerve deficit.     Coordination: Coordination normal.     Deep Tendon Reflexes: Reflexes are normal and symmetric.  Psychiatric:        Judgment: Judgment normal.       CMP ( most recent) CMP     Component Value Date/Time   NA 137 01/28/2020 1257   K 3.7 01/28/2020 1257   CL 103 01/28/2020 1257   CO2 25 01/28/2020 1257   GLUCOSE 129 (H) 01/28/2020 1257   BUN 9 03/09/2020 0000   CREATININE 1.5 (A) 03/09/2020 0000   CREATININE 1.10 (H) 01/28/2020 1257   CALCIUM 9.0 01/28/2020 1257   PROT 8.1 04/27/2020 1515   ALBUMIN 3.6 04/12/2020 1423   AST 38 (H) 04/27/2020 1515   ALT 26 04/27/2020 1515   ALKPHOS 76 04/12/2020 1423   BILITOT 0.3 04/27/2020 1515   GFRNONAA 35 03/09/2020 0000   GFRAA 40 03/09/2020 0000     Diabetic Labs (most recent): Lab Results  Component Value Date   HGBA1C 8.8 03/09/2020     Lipid Panel ( most recent) Lipid Panel     Component Value Date/Time   CHOL 186 03/09/2020 0000   TRIG 110 03/09/2020 0000   HDL 57 03/09/2020 0000   LDLCALC 109 03/09/2020 0000      Lab Results  Component Value Date   TSH 1.18 03/09/2020       Assessment & Plan:   1. Type 2 diabetes mellitus with stage 3b chronic kidney disease, without long-term current use of insulin ([place])  - Stephanie Moreno has currently uncontrolled symptomatic type 2 DM since  74 years of age.  She returns with some improvement in her glycemic profile, she wishes to avoid insulin treatment, her recent A1c was 8.8%.   No hypoglycemia was documented.   Recent labs reviewed.   - I had a long discussion with her about the progressive nature of diabetes and the pathology behind its complications. -her diabetes is complicated by CKD, CVA and she remains at a high risk for more acute and chronic complications which include CAD, CVA, CKD, retinopathy, and neuropathy. These are all discussed in detail with her.  - I have counseled her on diet  and weight management  by adopting a carbohydrate restricted/protein rich diet. Patient is encouraged to switch to  unprocessed or minimally processed     complex starch and increased protein intake (animal or plant source), fruits, and vegetables. -  she is advised to stick to a routine mealtimes to eat 3 meals  a day and avoid unnecessary snacks ( to snack only to correct hypoglycemia).   - she  admits there is a room for improvement in her diet and drink choices. -  Suggestion is made for her to avoid simple carbohydrates  from her diet including Cakes, Sweet Desserts / Pastries, Ice Cream, Soda (diet and regular), Sweet Tea, Candies, Chips, Cookies, Sweet Pastries,  Store Bought Juices, Alcohol in Excess of  1-2 drinks a day, Artificial Sweeteners, Coffee Creamer, and "Sugar-free" Products. This will help  patient to have stable blood glucose profile and potentially avoid unintended weight gain.   - she will be scheduled with Jearld Fenton, RDN, CDE for diabetes education.  - I have approached her with the following individualized plan to manage  her diabetes and patient agrees:   - she will likely require at least basal  insulin in order for her to achieve control of diabetes to target, however patient wishes to avoid insulin treatment for lack of enough support to administer it. -She agrees to continue to monitor blood glucose twice a day-daily before breakfast and at bedtime. - she is encouraged to call clinic for blood glucose levels less than 70 or above 300 mg /dl. - she is advised to continue Trulicity 1.5 mg subcutaneously weekly,  therapeutically suitable for patient . - her Metformin will be adjusted at 1000 mg p.o. daily, advised to increase her glipizide to 10 mg XL daily at breakfast.   - Specific targets for  A1c;  LDL, HDL,  and Triglycerides were discussed with the patient.  2) Blood Pressure /Hypertension:   Her blood pressure is controlled to target.   she is advised to continue her current medications including losartan 100 mg p.o. daily with breakfast . 3) Lipids/Hyperlipidemia:   Review of her recent lipid panel showed uncontrolled  LDL at 109 .  she  is advised to continue Crestor 10 mg p.o. daily at bedtime.  Side effects and precautions discussed with her.  4)  Weight/Diet:  Body mass index is 31.6 kg/m.  -   clearly complicating her diabetes care.   she is  a candidate for weight loss. I discussed with her the fact that loss of 5 - 10% of her  current body weight will have the most impact on her diabetes management.  Exercise, and detailed carbohydrates information provided  -  detailed on discharge instructions.  5) Chronic Care/Health Maintenance:  -she  is on ACEI/ARB and Statin medications and  is encouraged to initiate and continue to follow up with Ophthalmology, Dentist,  Podiatrist at least yearly or according to recommendations, and advised to  stay away from smoking. I have recommended yearly flu vaccine and pneumonia vaccine at least every 5 years; she cannot exercise optimally due to her disequilibrium.  She is advised to sleep for at least 7 hours a day.  - she is  advised to  maintain close follow up with Pablo Lawrence, NP for primary care needs, as well as her other providers for optimal and coordinated care.   - Time spent on this patient care encounter:  35 min, of which > 50% was spent in  counseling and the rest reviewing her blood glucose logs , discussing her hypoglycemia and hyperglycemia episodes, reviewing her current and  previous labs / studies  ( including abstraction from other facilities) and medications  doses and developing a  long term treatment plan and documenting her care.   Please refer to Patient Instructions for Blood Glucose Monitoring and Insulin/Medications Dosing Guide"  in media tab for additional information. Please  also refer to " Patient Self Inventory" in the Media  tab for reviewed elements of pertinent patient history.  Stephanie Moreno participated in the discussions, expressed understanding, and voiced agreement with the above plans.  All questions were answered to her satisfaction. she is encouraged to contact clinic should she have any questions or concerns prior to her return visit.    Follow up plan: - Return in about 9 weeks (  around 07/12/2020) for Bring Meter and Logs- A1c in Office.  Glade Lloyd, MD [facility] [address] [place], [place] [address] Phone: [phone]  Fax: [phone]    05/10/2020, 3:20 PM  This note was partially dictated with voice recognition software. Similar sounding words can be transcribed inadequately or may not  be corrected upon review.

## 2020-05-17 ENCOUNTER — Telehealth: Payer: Self-pay

## 2020-05-17 NOTE — Telephone Encounter (Signed)
Would this be ok? 

## 2020-05-17 NOTE — Telephone Encounter (Signed)
Estill Bamberg RN with [facility] called about seeing if we can get the pt a continuous glucose monitor. Please call Estill Bamberg [phone]

## 2020-05-18 NOTE — Telephone Encounter (Signed)
She will not qualify for CGM based on her current treatment program. It is for people who need to test 4 times a day and inject insulin 3-4 times daily.

## 2020-05-18 NOTE — Telephone Encounter (Signed)
Returned call to Estill Bamberg had to leave a [employer].

## 2020-05-20 ENCOUNTER — Encounter: Payer: Self-pay | Admitting: Gastroenterology

## 2020-05-20 ENCOUNTER — Other Ambulatory Visit: Payer: Self-pay

## 2020-05-20 ENCOUNTER — Telehealth: Payer: Self-pay | Admitting: Internal Medicine

## 2020-05-20 ENCOUNTER — Ambulatory Visit (INDEPENDENT_AMBULATORY_CARE_PROVIDER_SITE_OTHER): Payer: Medicare HMO | Admitting: Gastroenterology

## 2020-05-20 VITALS — BP 134/79 | HR 78 | Temp 96.7°F | Ht 63.0 in | Wt 179.2 lb

## 2020-05-20 DIAGNOSIS — R1013 Epigastric pain: Secondary | ICD-10-CM

## 2020-05-20 DIAGNOSIS — K5900 Constipation, unspecified: Secondary | ICD-10-CM

## 2020-05-20 NOTE — Patient Instructions (Signed)
Continue Protonix once daily, 30 minutes before breakfast. Continue Linzess daily as you are doing.  We will see you back in 6-8 months!   Please call with the number for special services, and I will see what I can do!   I enjoyed seeing you again today! As you know, I value our relationship and want to provide genuine, compassionate, and quality care. I welcome your feedback. If you receive a survey regarding your visit,  I greatly appreciate you taking time to fill this out. See you next time!  Annitta Needs, PhD, ANP-BC [facility]

## 2020-05-20 NOTE — Progress Notes (Signed)
Referring Provider: No ref. provider found Primary Care Physician:  Pablo Lawrence, NP Primary GI: Dr. Abbey Chatters  Chief Complaint  Patient presents with  . Constipation    occ  . Gastroesophageal Reflux    HPI:   Stephanie Moreno is a 74 y.o. female presenting today with a long-standing history of abdominal pain and nausea, previously established with GI in [place] but moved to this area in June 2021. Colonoscopy and EGD completed recently with internal hemorrhoids on colonoscopy and benign-appearing esophageal stenosis s/p dilation, gastritis s/p biopsy, normal duodenum. Negative H.pylori, negative duodenal biopsy.   Gallbladder remains in situ. Due to mildly dilated CBD on CT, MRI was completed which showed mild dilation of CBD measuring up to 55m, likely age-related. Fatty liver. Mildly elevated AST, isolated elevation. Negative Hep B surface antigen and Hep C antibody, no iron overload, component of IDA noted. Anemia multifactorial in setting of chronic disease  Prilosec changed to PRotonix at last visit and requested to take daily. Doing better with this. Abdominal pain improved.   Linzess: working well for constipation.   Lower extremity swelling. Lower back pain with standing for longer periods of time.   Wants to get a special service that she can qualify for but difficulty getting up with PCP to do so. Would help with transportation and help inside home. Requesting assistance with this.   Past Medical History:  Diagnosis Date  . Acid reflux disease   . Breast cancer ([place]   . [place]   . Diverticulitis   . Hypertension   . Sleep apnea   . Thyroid disease     Past Surgical History:  Procedure Laterality Date  . BIOPSY  02/19/2020   Procedure: BIOPSY;  Surgeon: CEloise Harman DO;  [facility];  Service: Endoscopy;;  . BREAST SURGERY    . COLONOSCOPY WITH PROPOFOL N/A 02/19/2020   non-bleeding internal  hemorrhoids, many small-mouthed diverticula in entire colon.  . ESOPHAGOGASTRODUODENOSCOPY (EGD) WITH PROPOFOL N/A 02/19/2020    benign-appearing esophageal stenosis s/p dilation, gastritis s/p biopsy, normal duodenum. Negative H.pylori, negative duodenal biopsy.   . THYROIDECTOMY    . tummy tuck     at time of breast surgery 2003    Current Outpatient Medications  Medication Sig Dispense Refill  . acetaminophen (TYLENOL) 500 MG tablet Take 1,000 mg by mouth as needed for moderate pain.     .Marland Kitchenamitriptyline (ELAVIL) 25 MG tablet Take 25 mg by mouth at bedtime.    .Marland Kitchenaspirin EC 81 MG tablet Take 81 mg by mouth every morning. Swallow whole.    . beclomethasone (QVAR) 80 MCG/ACT inhaler Inhale 1 puff into the lungs 2 (two) times daily as needed (shortness of breath).    . Blood Glucose Monitoring Suppl (ACCU-CHEK GUIDE ME) w/Device KIT 1 Piece by Does not apply route as directed. 1 kit 0  . Dulaglutide (TRULICITY) 1.5 MMB/8.4YKSOPN Inject 1.5 mg into the skin every Monday.    . furosemide (LASIX) 40 MG tablet Take 40 mg by mouth daily.    .Marland KitchenglipiZIDE (GLUCOTROL XL) 5 MG 24 hr tablet Take 2 tablets (10 mg total) by mouth daily with breakfast. 60 tablet 3  . GLUCOSAMINE-CHONDROITIN PO Take 1 tablet by mouth daily.    .Marland Kitchenglucose blood (ACCU-CHEK GUIDE) test strip Use as instructed 150 each 2  . lidocaine (LIDODERM) 5 % Place 1 patch onto the skin daily as needed (pain). Remove & Discard patch  within 12 hours or as directed by MD. Applied to lower back/shoulders/neck for pain     . linaclotide (LINZESS) 72 MCG capsule Take 1 capsule (72 mcg total) by mouth daily before breakfast. 90 capsule 3  . losartan (COZAAR) 50 MG tablet Take 100 mg by mouth daily.    . magnesium oxide (MAG-OX) 400 MG tablet Take 400 mg by mouth daily.    . meclizine (ANTIVERT) 25 MG tablet Take 25 mg by mouth 3 (three) times daily as needed for dizziness.    . metFORMIN (GLUCOPHAGE) 500 MG tablet Take 2 tablets (1,000 mg total)  by mouth daily with breakfast. 30 tablet 3  . metoprolol tartrate (LOPRESSOR) 50 MG tablet Take 50 mg by mouth 2 (two) times daily.    . Naphazoline HCl (CLEAR EYES OP) Place 1 drop into both eyes daily as needed (dry eyes).    . pantoprazole (PROTONIX) 40 MG tablet Take 1 tablet (40 mg total) by mouth daily. 30 minutes before breakfast 90 tablet 3  . polyethylene glycol (MIRALAX / GLYCOLAX) 17 g packet Take 17 g by mouth daily as needed for moderate constipation.    . rosuvastatin (CRESTOR) 10 MG tablet Take 10 mg by mouth at bedtime.     . sucralfate (CARAFATE) 1 GM/10ML suspension Take 1.5 g by mouth 3 (three) times daily as needed (indigestion).     . verapamil (VERELAN PM) 180 MG 24 hr capsule Take 180 mg by mouth daily.    . calcium-vitamin D (OSCAL WITH D) 500-200 MG-UNIT tablet Take 1 tablet by mouth 3 (three) times daily. (Patient not taking: Reported on 05/20/2020)     No current facility-administered medications for this visit.    Allergies as of 05/20/2020 - Review Complete 05/20/2020  Allergen Reaction Noted  . Codeine Nausea And Vomiting 01/05/2020  . Lisinopril Nausea And Vomiting 01/05/2020  . Penicillins Hives 01/05/2020    Family History  Problem Relation Age of Onset  . Diabetes Mother   . Thyroid disease Mother   . Colon cancer Neg Hx     Social History   Socioeconomic History  . Marital status: Divorced    Spouse name: Not on file  . Number of children: Not on file  . Years of education: Not on file  . Highest education level: Not on file  Occupational History  . 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 Smoking status: Never Smoker  . Smokeless tobacco: Never Used  Substance and Sexual Activity  . Alcohol use: Not Currently  . Drug use: Not Currently  . Sexual activity: Not on file  Other Topics Concern  . Not on file  Social History Narrative  . Not on file   Social Determinants of Health   Financial  Resource Strain:   . Difficulty of Paying Living Expenses: Not on file  Food Insecurity:   . Worried About Charity fundraiser in the Last Year: Not on file  . Ran Out of Food in the Last Year: Not on file  Transportation Needs:   . Lack of Transportation (Medical): Not on file  . Lack of Transportation (Non-Medical): Not on file  Physical Activity:   . Days of Exercise per Week: Not on file  . Minutes of Exercise per Session: Not on file  Stress:   . Feeling of Stress : Not on file  Social Connections:   . Frequency of Communication with Friends and  Family: Not on file  . Frequency of Social Gatherings with Friends and Family: Not on file  . Attends Religious Services: Not on file  . Active Member of Clubs or Organizations: Not on file  . Attends Archivist Meetings: Not on file  . Marital Status: Not on file    Review of Systems: Gen: Denies fever, chills, anorexia. Denies fatigue, weakness, weight loss.  CV: Denies chest pain, palpitations, syncope, peripheral edema, and claudication. Resp: Denies dyspnea at rest, cough, wheezing, coughing up blood, and pleurisy. GI: see HPI Derm: Denies rash, itching, dry skin Psych: Denies depression, anxiety, memory loss, confusion. No homicidal or suicidal ideation.  Heme: Denies bruising, bleeding, and enlarged lymph nodes.  Physical Exam: BP 134/79   Pulse 78   Temp (!) 96.7 F (35.9 C) (Temporal)   Ht 5' 3"  (1.6 m)   Wt 179 lb 3.2 oz (81.3 kg)   BMI 31.74 kg/m  General:   Alert and oriented. No distress noted. Pleasant and cooperative.  Head:  Normocephalic and atraumatic. Eyes:  Conjuctiva clear without scleral icterus. Mouth:  Mask in place Abdomen:  +BS, soft, non-tender and non-distended. Limited exam with patient in chair Msk:  Symmetrical without gross deformities. Normal posture. Extremities:  Without edema. Neurologic:  Alert and  oriented x4 Psych:  Alert and cooperative. Normal mood and  affect.  ASSESSMENT/PLAN: Stephanie Moreno is a 74 y.o. female presenting today with long-standing history of abdominal pain, nausea, fatty liver, with recent TCS/ EGD on file.  Improvement noted in dyspepsia after changing PPI to Protonix.   Fatty liver: very mildly elevated AST. Follow serially.   Constipation: continue Linzess 72 mcg.   Will attempt to help arrange special home services for patient once she finds contact; she will call our office with update.   Return in 6-8 months.  Annitta Needs, PhD, ANP-BC [facility]

## 2020-05-20 NOTE — Telephone Encounter (Signed)
Pt was seen in office this morning and called to say that Roseanne Kaufman, NP needed a number. She said it was [phone]. Pt's number is [phone]

## 2020-05-20 NOTE — Telephone Encounter (Signed)
NOTED

## 2020-05-21 ENCOUNTER — Encounter: Payer: Medicare HMO | Attending: "Endocrinology | Admitting: Dietician

## 2020-05-21 ENCOUNTER — Encounter: Payer: Self-pay | Admitting: Dietician

## 2020-05-21 VITALS — Ht 63.0 in | Wt 172.2 lb

## 2020-05-21 DIAGNOSIS — E1149 Type 2 diabetes mellitus with other diabetic neurological complication: Secondary | ICD-10-CM | POA: Diagnosis not present

## 2020-05-21 NOTE — Progress Notes (Signed)
Medical Nutrition Therapy:  Appt start time: 1130 end time:  1230.  Assessment:  Primary concerns today: Diabetes.   Pt reports some depression due to lower leg pain and swelling. Reports symptoms of neuropathy, states it comes on every other week or so. Occurs mostly in her foot, but sometimes in her hand. Pt checks her feet regularly. States that she has a red mark on her big toe. Pt reports drowsiness with some of her medications. Pt reports not having an appetite in the morning, first meal around lunchtime. Wakes up around 9 am. Pt reports having trouble getting enough blood when testing BG. Pt has meter present, and requested dietitian to demonstrate how to adjust lancet depth.  Pt uses transportation services to get to and from her doctor's appointments.  Preferred Learning Style:   Visual  Hands on   Learning Readiness:   Contemplating   MEDICATIONS: metFormin, Glipizide, Trulicity   DIETARY INTAKE:  Usual eating pattern includes 1-2 meals    24-hr recall:  B ( AM): Skipped  Snk ( AM): none L (1 PM): Bowl of corn flakes in whole milk, banana, water Snk ( PM): none D ( PM): Peanut butter and banana sandwich on honey wheat, watered down juice Snk ( PM): Pimento cheese sandwich, honey wheat bread, water Beverages: Water, sometimes juice  Usual physical activity: ADLs   Progress Towards Goal(s):  In progress.   Nutritional Diagnosis:  NB-1.1 Food and nutrition-related knowledge deficit As related to Diabetes.  As evidenced by A1c of 8.8, and skipping meals/dietary pattern.    Intervention:  Nutrition Education.  Educated patient on the pathophysiology of diabetes. This includes why our bodies need circulating blood sugar, the relationship between insulin and blood sugar, and the results of insulin resistance and/or pancreatic insufficiency on the development of diabetes. Educated patient on factors that contribute to elevation of blood sugars, such as stress, illness,  injury,and food choices. Discussed the role that physical activity plays in lowering blood sugar. Educate patient on the three main macronutrients. Protein, fats, and carbohydrates. Discussed how each of these macronutrients affect blood sugar levels, especially carbohydrate, and the importance of eating a consistent amount of carbohydrate throughout the day. Advised patient on the importance of consistently checking their blood sugar, and recognizing how lifestyle and food choices affect those numbers. Educated patient on the balanced plate eating model. Recommended lunch and dinner be 1/2 non-starchy vegetables, 1/4 starches, and 1/4 protein. Demonstrate how to adjust lancet depth on lancing device due to patient complaining of not getting enough blood while testing BG.  Goals:  Seek a podiatrist and have the redness on your foot checked.   [facility]  [phone]  Check your feet every day!  When checking blood sugar, prick the side of your finger. If you don't get enough blood, turn the dial at the bottom to increase the number on your lancing device. The higher the number, the deeper the needle will go.  Eat three meals a day!   Follow the balanced plate model when planning and putting together your meals.  Teaching Method Utilized:  Visual Auditory   Handouts given during visit include:  Diabetes Instructions  Diabetes MyPlate  Barriers to learning/adherence to lifestyle change: Transportation  Demonstrated degree of understanding via:  Teach Back   Monitoring/Evaluation:  Dietary intake, exercise, and body weight in 2 month(s).

## 2020-05-21 NOTE — Patient Instructions (Addendum)
Seek a podiatrist and have the redness on your foot checked.  [facility] [phone] Check your feet every day!  When checking blood sugar, prick the side of your finger. If you don't get enough blood, turn the dial at the bottom to increase the number on your lancing device. The higher the number, the deeper the needle will go.  Eat three meals a day!   Follow the balanced plate model when planning and putting together your meals.

## 2020-05-25 DIAGNOSIS — E1122 Type 2 diabetes mellitus with diabetic chronic kidney disease: Secondary | ICD-10-CM | POA: Diagnosis not present

## 2020-05-25 DIAGNOSIS — N3289 Other specified disorders of bladder: Secondary | ICD-10-CM | POA: Diagnosis not present

## 2020-05-25 DIAGNOSIS — E539 Vitamin B deficiency, unspecified: Secondary | ICD-10-CM | POA: Diagnosis not present

## 2020-05-25 DIAGNOSIS — I129 Hypertensive chronic kidney disease with stage 1 through stage 4 chronic kidney disease, or unspecified chronic kidney disease: Secondary | ICD-10-CM | POA: Diagnosis not present

## 2020-05-25 DIAGNOSIS — N1832 Chronic kidney disease, stage 3b: Secondary | ICD-10-CM | POA: Diagnosis not present

## 2020-05-25 DIAGNOSIS — Z7984 Long term (current) use of oral hypoglycemic drugs: Secondary | ICD-10-CM | POA: Diagnosis not present

## 2020-05-25 DIAGNOSIS — G2581 Restless legs syndrome: Secondary | ICD-10-CM | POA: Diagnosis not present

## 2020-05-25 DIAGNOSIS — Z7982 Long term (current) use of aspirin: Secondary | ICD-10-CM | POA: Diagnosis not present

## 2020-05-25 DIAGNOSIS — Z7409 Other reduced mobility: Secondary | ICD-10-CM | POA: Diagnosis not present

## 2020-05-25 DIAGNOSIS — G4733 Obstructive sleep apnea (adult) (pediatric): Secondary | ICD-10-CM | POA: Diagnosis not present

## 2020-05-25 DIAGNOSIS — E782 Mixed hyperlipidemia: Secondary | ICD-10-CM | POA: Diagnosis not present

## 2020-05-25 DIAGNOSIS — N3946 Mixed incontinence: Secondary | ICD-10-CM | POA: Diagnosis not present

## 2020-05-25 DIAGNOSIS — E1140 Type 2 diabetes mellitus with diabetic neuropathy, unspecified: Secondary | ICD-10-CM | POA: Diagnosis not present

## 2020-05-25 DIAGNOSIS — Z741 Need for assistance with personal care: Secondary | ICD-10-CM | POA: Diagnosis not present

## 2020-05-29 DIAGNOSIS — I129 Hypertensive chronic kidney disease with stage 1 through stage 4 chronic kidney disease, or unspecified chronic kidney disease: Secondary | ICD-10-CM | POA: Diagnosis not present

## 2020-05-29 DIAGNOSIS — N1832 Chronic kidney disease, stage 3b: Secondary | ICD-10-CM | POA: Diagnosis not present

## 2020-05-29 DIAGNOSIS — E539 Vitamin B deficiency, unspecified: Secondary | ICD-10-CM | POA: Diagnosis not present

## 2020-05-29 DIAGNOSIS — Z7982 Long term (current) use of aspirin: Secondary | ICD-10-CM | POA: Diagnosis not present

## 2020-05-29 DIAGNOSIS — G4733 Obstructive sleep apnea (adult) (pediatric): Secondary | ICD-10-CM | POA: Diagnosis not present

## 2020-05-29 DIAGNOSIS — N3289 Other specified disorders of bladder: Secondary | ICD-10-CM | POA: Diagnosis not present

## 2020-05-29 DIAGNOSIS — E1122 Type 2 diabetes mellitus with diabetic chronic kidney disease: Secondary | ICD-10-CM | POA: Diagnosis not present

## 2020-05-29 DIAGNOSIS — E782 Mixed hyperlipidemia: Secondary | ICD-10-CM | POA: Diagnosis not present

## 2020-05-29 DIAGNOSIS — N3946 Mixed incontinence: Secondary | ICD-10-CM | POA: Diagnosis not present

## 2020-05-29 DIAGNOSIS — Z7984 Long term (current) use of oral hypoglycemic drugs: Secondary | ICD-10-CM | POA: Diagnosis not present

## 2020-06-02 ENCOUNTER — Telehealth: Payer: Self-pay | Admitting: Gastroenterology

## 2020-06-02 DIAGNOSIS — E1122 Type 2 diabetes mellitus with diabetic chronic kidney disease: Secondary | ICD-10-CM | POA: Diagnosis not present

## 2020-06-02 DIAGNOSIS — E1129 Type 2 diabetes mellitus with other diabetic kidney complication: Secondary | ICD-10-CM | POA: Diagnosis not present

## 2020-06-02 DIAGNOSIS — N189 Chronic kidney disease, unspecified: Secondary | ICD-10-CM | POA: Diagnosis not present

## 2020-06-02 DIAGNOSIS — R809 Proteinuria, unspecified: Secondary | ICD-10-CM | POA: Diagnosis not present

## 2020-06-02 DIAGNOSIS — E611 Iron deficiency: Secondary | ICD-10-CM | POA: Diagnosis not present

## 2020-06-02 DIAGNOSIS — R768 Other specified abnormal immunological findings in serum: Secondary | ICD-10-CM | POA: Diagnosis not present

## 2020-06-02 NOTE — Telephone Encounter (Signed)
[facility] clinical pool:  I received a call from Dr. Theador Hawthorne with Nephrology. She is iron deficient, ferritin low at 13. Hgb 12.3.   Recommend capsule study to conclude GI evaluation if she is willing. Colonoscopy/EGD recently on file.

## 2020-06-03 DIAGNOSIS — Z7982 Long term (current) use of aspirin: Secondary | ICD-10-CM | POA: Diagnosis not present

## 2020-06-03 DIAGNOSIS — N3289 Other specified disorders of bladder: Secondary | ICD-10-CM | POA: Diagnosis not present

## 2020-06-03 DIAGNOSIS — N1832 Chronic kidney disease, stage 3b: Secondary | ICD-10-CM | POA: Diagnosis not present

## 2020-06-03 DIAGNOSIS — N3946 Mixed incontinence: Secondary | ICD-10-CM | POA: Diagnosis not present

## 2020-06-03 DIAGNOSIS — G4733 Obstructive sleep apnea (adult) (pediatric): Secondary | ICD-10-CM | POA: Diagnosis not present

## 2020-06-03 DIAGNOSIS — E539 Vitamin B deficiency, unspecified: Secondary | ICD-10-CM | POA: Diagnosis not present

## 2020-06-03 DIAGNOSIS — I129 Hypertensive chronic kidney disease with stage 1 through stage 4 chronic kidney disease, or unspecified chronic kidney disease: Secondary | ICD-10-CM | POA: Diagnosis not present

## 2020-06-03 DIAGNOSIS — E782 Mixed hyperlipidemia: Secondary | ICD-10-CM | POA: Diagnosis not present

## 2020-06-03 DIAGNOSIS — Z7984 Long term (current) use of oral hypoglycemic drugs: Secondary | ICD-10-CM | POA: Diagnosis not present

## 2020-06-03 DIAGNOSIS — E1122 Type 2 diabetes mellitus with diabetic chronic kidney disease: Secondary | ICD-10-CM | POA: Diagnosis not present

## 2020-06-03 NOTE — Telephone Encounter (Signed)
Noted! Thank you

## 2020-06-03 NOTE — Telephone Encounter (Signed)
Called and informed pt of Anna's recommendation. She is agreeable to Givens capsule study. Givens scheduled for 06/14/20 at 7:30am. Instructions given on phone and mailed. She has been prescribed Iron but hasn't started it. Advised her to hold off on Iron until after doing Givens because she would have to start holding the Iron 06/07/20 to prepare for Givens. Orders entered.  Routing to [employer] as Juluis Rainier.  Message was sent to Neil Crouch PA to read Givens.

## 2020-06-07 ENCOUNTER — Telehealth: Payer: Self-pay | Admitting: Internal Medicine

## 2020-06-07 NOTE — Telephone Encounter (Signed)
Please call patient at [phone]. She is having problems with the new medication she's taking. She said it was "messing with her head".

## 2020-06-07 NOTE — Telephone Encounter (Signed)
Phoned the pt back and advised of her last ov here was in November. That we hadn't prescribed anything for her last Wednesday. Pt meant to call her dr who prescribed her something for neuropathy.

## 2020-06-08 ENCOUNTER — Telehealth: Payer: Self-pay

## 2020-06-08 ENCOUNTER — Telehealth: Payer: Self-pay | Admitting: Nutrition

## 2020-06-08 DIAGNOSIS — J0100 Acute maxillary sinusitis, unspecified: Secondary | ICD-10-CM | POA: Diagnosis not present

## 2020-06-08 DIAGNOSIS — Z9013 Acquired absence of bilateral breasts and nipples: Secondary | ICD-10-CM | POA: Diagnosis not present

## 2020-06-08 DIAGNOSIS — R051 Acute cough: Secondary | ICD-10-CM | POA: Diagnosis not present

## 2020-06-08 DIAGNOSIS — C50219 Malignant neoplasm of upper-inner quadrant of unspecified female breast: Secondary | ICD-10-CM | POA: Diagnosis not present

## 2020-06-08 DIAGNOSIS — R2243 Localized swelling, mass and lump, lower limb, bilateral: Secondary | ICD-10-CM | POA: Diagnosis not present

## 2020-06-08 DIAGNOSIS — R42 Dizziness and giddiness: Secondary | ICD-10-CM | POA: Diagnosis not present

## 2020-06-08 DIAGNOSIS — R531 Weakness: Secondary | ICD-10-CM | POA: Diagnosis not present

## 2020-06-08 DIAGNOSIS — K5900 Constipation, unspecified: Secondary | ICD-10-CM | POA: Diagnosis not present

## 2020-06-08 DIAGNOSIS — N1832 Chronic kidney disease, stage 3b: Secondary | ICD-10-CM | POA: Diagnosis not present

## 2020-06-08 DIAGNOSIS — K30 Functional dyspepsia: Secondary | ICD-10-CM | POA: Diagnosis not present

## 2020-06-08 DIAGNOSIS — J4521 Mild intermittent asthma with (acute) exacerbation: Secondary | ICD-10-CM | POA: Diagnosis not present

## 2020-06-08 DIAGNOSIS — R0602 Shortness of breath: Secondary | ICD-10-CM | POA: Diagnosis not present

## 2020-06-08 DIAGNOSIS — I129 Hypertensive chronic kidney disease with stage 1 through stage 4 chronic kidney disease, or unspecified chronic kidney disease: Secondary | ICD-10-CM | POA: Diagnosis not present

## 2020-06-08 DIAGNOSIS — G2581 Restless legs syndrome: Secondary | ICD-10-CM | POA: Diagnosis not present

## 2020-06-08 DIAGNOSIS — E1140 Type 2 diabetes mellitus with diabetic neuropathy, unspecified: Secondary | ICD-10-CM | POA: Diagnosis not present

## 2020-06-08 DIAGNOSIS — B370 Candidal stomatitis: Secondary | ICD-10-CM | POA: Diagnosis not present

## 2020-06-08 NOTE — Telephone Encounter (Signed)
Pt left vm this morning on my answering machine stating she was having reactions to her Pregabapetin and she feels like she is racing with her walker, her head is spinning and she is dizzy and her feet feel funny. She notes she wants to be taken off of the medication. She was seem by my coworker, Danise Edge, which is how she must have gotten our number.  I called Dr. Delphina Cahill office and left message with receptionist to have them contact patient as soon as possible. She verbalized she would have staff reach out to patient.

## 2020-06-08 NOTE — Telephone Encounter (Signed)
We did not prescribe gabapentin for her, may call whoever prescribed that for her. We saw her for diabetes and was kept on Trulicity, Metformin and glipizide.

## 2020-06-08 NOTE — Telephone Encounter (Signed)
Pt called and left a message with our dietician Jearld Fenton that she had been prescribed pregabalin by Lelon Frohlich at [facility] office. Stated she's experiencing dizziness headache, fatigue,  feels like she's moving very fast, "running with my walker". Kieth Brightly had already left a message with Dr.Hall's office making them aware of pt's message. I called pt back and she stated she had heard back from [facility] office and she has an appointment this morning with them to address her medications and symptoms.

## 2020-06-08 NOTE — Telephone Encounter (Signed)
Called pt, she has an appointment today with [place] office.

## 2020-06-08 NOTE — Telephone Encounter (Signed)
Patient left VM  stating she was having some reactions of her to her pregabapentin; head hurting, she was racing with her walker and feels dizzy . Had Johnanna Schneiders, LPN listen to VM and she will follow up with nurse at PCP office to discuss.

## 2020-06-09 DIAGNOSIS — N3289 Other specified disorders of bladder: Secondary | ICD-10-CM | POA: Diagnosis not present

## 2020-06-09 DIAGNOSIS — E1122 Type 2 diabetes mellitus with diabetic chronic kidney disease: Secondary | ICD-10-CM | POA: Diagnosis not present

## 2020-06-09 DIAGNOSIS — I129 Hypertensive chronic kidney disease with stage 1 through stage 4 chronic kidney disease, or unspecified chronic kidney disease: Secondary | ICD-10-CM | POA: Diagnosis not present

## 2020-06-09 DIAGNOSIS — G4733 Obstructive sleep apnea (adult) (pediatric): Secondary | ICD-10-CM | POA: Diagnosis not present

## 2020-06-09 DIAGNOSIS — E539 Vitamin B deficiency, unspecified: Secondary | ICD-10-CM | POA: Diagnosis not present

## 2020-06-09 DIAGNOSIS — Z7982 Long term (current) use of aspirin: Secondary | ICD-10-CM | POA: Diagnosis not present

## 2020-06-09 DIAGNOSIS — N3946 Mixed incontinence: Secondary | ICD-10-CM | POA: Diagnosis not present

## 2020-06-09 DIAGNOSIS — Z7984 Long term (current) use of oral hypoglycemic drugs: Secondary | ICD-10-CM | POA: Diagnosis not present

## 2020-06-09 DIAGNOSIS — E782 Mixed hyperlipidemia: Secondary | ICD-10-CM | POA: Diagnosis not present

## 2020-06-09 DIAGNOSIS — N1832 Chronic kidney disease, stage 3b: Secondary | ICD-10-CM | POA: Diagnosis not present

## 2020-06-14 ENCOUNTER — Encounter (HOSPITAL_COMMUNITY)
Admission: RE | Disposition: A | Discharge status: Home / Self Care | Payer: Self-pay | Source: Home / Self Care | Attending: Internal Medicine

## 2020-06-14 ENCOUNTER — Encounter (HOSPITAL_COMMUNITY): Payer: Self-pay

## 2020-06-14 ENCOUNTER — Ambulatory Visit (HOSPITAL_COMMUNITY)
Admission: RE | Admit: 2020-06-14 | Discharge: 2020-06-14 | Disposition: A | Discharge status: Home / Self Care | Payer: Medicare HMO | Attending: Internal Medicine | Admitting: Internal Medicine

## 2020-06-14 DIAGNOSIS — Z7984 Long term (current) use of oral hypoglycemic drugs: Secondary | ICD-10-CM | POA: Diagnosis not present

## 2020-06-14 DIAGNOSIS — Z885 Allergy status to narcotic agent status: Secondary | ICD-10-CM | POA: Diagnosis not present

## 2020-06-14 DIAGNOSIS — N1832 Chronic kidney disease, stage 3b: Secondary | ICD-10-CM | POA: Diagnosis not present

## 2020-06-14 DIAGNOSIS — N3289 Other specified disorders of bladder: Secondary | ICD-10-CM | POA: Diagnosis not present

## 2020-06-14 DIAGNOSIS — Z888 Allergy status to other drugs, medicaments and biological substances status: Secondary | ICD-10-CM | POA: Diagnosis not present

## 2020-06-14 DIAGNOSIS — Z8719 Personal history of other diseases of the digestive system: Secondary | ICD-10-CM | POA: Insufficient documentation

## 2020-06-14 DIAGNOSIS — Z79899 Other long term (current) drug therapy: Secondary | ICD-10-CM | POA: Diagnosis not present

## 2020-06-14 DIAGNOSIS — E539 Vitamin B deficiency, unspecified: Secondary | ICD-10-CM | POA: Diagnosis not present

## 2020-06-14 DIAGNOSIS — I129 Hypertensive chronic kidney disease with stage 1 through stage 4 chronic kidney disease, or unspecified chronic kidney disease: Secondary | ICD-10-CM | POA: Diagnosis not present

## 2020-06-14 DIAGNOSIS — Z880 Allergy status to penicillin: Secondary | ICD-10-CM | POA: Insufficient documentation

## 2020-06-14 DIAGNOSIS — N3946 Mixed incontinence: Secondary | ICD-10-CM | POA: Diagnosis not present

## 2020-06-14 DIAGNOSIS — D509 Iron deficiency anemia, unspecified: Secondary | ICD-10-CM | POA: Diagnosis not present

## 2020-06-14 DIAGNOSIS — G4733 Obstructive sleep apnea (adult) (pediatric): Secondary | ICD-10-CM | POA: Diagnosis not present

## 2020-06-14 DIAGNOSIS — Z7982 Long term (current) use of aspirin: Secondary | ICD-10-CM | POA: Diagnosis not present

## 2020-06-14 DIAGNOSIS — E1122 Type 2 diabetes mellitus with diabetic chronic kidney disease: Secondary | ICD-10-CM | POA: Diagnosis not present

## 2020-06-14 DIAGNOSIS — E782 Mixed hyperlipidemia: Secondary | ICD-10-CM | POA: Diagnosis not present

## 2020-06-14 HISTORY — PX: GIVENS CAPSULE STUDY: SHX5432

## 2020-06-14 SURGERY — IMAGING PROCEDURE, GI TRACT, INTRALUMINAL, VIA CAPSULE

## 2020-06-15 ENCOUNTER — Encounter (HOSPITAL_COMMUNITY): Payer: Self-pay | Admitting: Internal Medicine

## 2020-06-20 NOTE — H&P (Signed)
Primary Care Physician:  Pablo Lawrence, NP Primary Gastroenterologist:  Dr. Abbey Chatters  Pre-Procedure History & Physical: HPI:  Stephanie Moreno is a 74 y.o. female is here for a GIVENS capsule to be performed for iron deficiency anemia.    Past Medical History:  Diagnosis Date  . Acid reflux disease   . Breast cancer ([place])   . Diabetes mellitus without complication ([place])   . Diverticulitis   . Hypertension   . Sleep apnea   . Thyroid disease     Past Surgical History:  Procedure Laterality Date  . BIOPSY  02/19/2020   Procedure: BIOPSY;  Surgeon: Eloise Harman, DO;  [facility];  Service: Endoscopy;;  . BREAST SURGERY    . COLONOSCOPY WITH PROPOFOL N/A 02/19/2020   non-bleeding internal hemorrhoids, many small-mouthed diverticula in entire colon.  . ESOPHAGOGASTRODUODENOSCOPY (EGD) WITH PROPOFOL N/A 02/19/2020    benign-appearing esophageal stenosis s/p dilation, gastritis s/p biopsy, normal duodenum. Negative H.pylori, negative duodenal biopsy.   Marland Kitchen GIVENS CAPSULE STUDY N/A 06/14/2020   Procedure: GIVENS CAPSULE STUDY;  Surgeon: Eloise Harman, DO;  Location: [facility];  Service: Endoscopy;  Laterality: N/A;  7:30am  . THYROIDECTOMY    . tummy tuck     at time of breast surgery 2003    Prior to Admission medications   Medication Sig Start Date End Date Taking? Authorizing Provider  acetaminophen (TYLENOL) 500 MG tablet Take 1,000 mg by mouth as needed for moderate pain.     [provider]  amitriptyline (ELAVIL) 25 MG tablet Take 25 mg by mouth at bedtime.    [provider]  aspirin EC 81 MG tablet Take 81 mg by mouth every morning. Swallow whole.    [provider]  beclomethasone (QVAR) 80 MCG/ACT inhaler Inhale 1 puff into the lungs 2 (two) times daily as needed (shortness of breath).    [provider]  Blood Glucose Monitoring Suppl (ACCU-CHEK GUIDE ME) w/Device KIT 1 Piece by Does not apply route as directed.  04/15/20   Cassandria Anger, MD  Dulaglutide (TRULICITY) 1.5 IN/8.6VE SOPN Inject 1.5 mg into the skin every Monday.    [provider]  furosemide (LASIX) 40 MG tablet Take 40 mg by mouth daily.    [provider]  glipiZIDE (GLUCOTROL XL) 5 MG 24 hr tablet Take 2 tablets (10 mg total) by mouth daily with breakfast. 05/10/20   Nida, Marella Chimes, MD  GLUCOSAMINE-CHONDROITIN PO Take 1 tablet by mouth daily.    [provider]  glucose blood (ACCU-CHEK GUIDE) test strip Use as instructed 04/15/20   Cassandria Anger, MD  lidocaine (LIDODERM) 5 % Place 1 patch onto the skin daily as needed (pain). Remove & Discard patch within 12 hours or as directed by MD. Applied to lower back/shoulders/neck for pain     [provider]  linaclotide (LINZESS) 72 MCG capsule Take 1 capsule (72 mcg total) by mouth daily before breakfast. 04/09/20   Annitta Needs, NP  losartan (COZAAR) 50 MG tablet Take 100 mg by mouth daily.    [provider]  magnesium oxide (MAG-OX) 400 MG tablet Take 400 mg by mouth daily. 01/17/20   [provider]  meclizine (ANTIVERT) 25 MG tablet Take 25 mg by mouth 3 (three) times daily as needed for dizziness.    [provider]  metFORMIN (GLUCOPHAGE) 500 MG tablet Take 2 tablets (1,000 mg total) by mouth daily with breakfast. 04/15/20  Cassandria Anger, MD  metoprolol tartrate (LOPRESSOR) 50 MG tablet Take 50 mg by mouth 2 (two) times daily. 01/17/20   [provider]  Naphazoline HCl (CLEAR EYES OP) Place 1 drop into both eyes daily as needed (dry eyes).    [provider]  omeprazole (PRILOSEC) 40 MG capsule Take 40 mg by mouth daily.    [provider]  pantoprazole (PROTONIX) 40 MG tablet Take 1 tablet (40 mg total) by mouth daily. 30 minutes before breakfast 04/09/20   Annitta Needs, NP  polyethylene glycol (MIRALAX / GLYCOLAX) 17 g packet Take 17 g by mouth daily as needed for  moderate constipation.    [provider]  rosuvastatin (CRESTOR) 10 MG tablet Take 10 mg by mouth at bedtime.     [provider]  sucralfate (CARAFATE) 1 GM/10ML suspension Take 1.5 g by mouth 3 (three) times daily as needed (indigestion).     [provider]  verapamil (VERELAN PM) 180 MG 24 hr capsule Take 180 mg by mouth daily. 01/17/20   [provider]    Allergies as of 06/03/2020 - Review Complete 05/21/2020  Allergen Reaction Noted  . Codeine Nausea And Vomiting 01/05/2020  . Lisinopril Nausea And Vomiting 01/05/2020  . Penicillins Hives 01/05/2020    Family History  Problem Relation Age of Onset  . Diabetes Mother   . Thyroid disease Mother   . Colon cancer Neg Hx     Social History   Socioeconomic History  . Marital status: Divorced    Spouse name: Not on file  . Number of children: Not on file  . Years of education: Not on file  . Highest education level: Not on file  Occupational History  . 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 Smoking status: Never Smoker  . Smokeless tobacco: Never Used  Substance and Sexual Activity  . Alcohol use: Not Currently  . Drug use: Not Currently  . Sexual activity: Not on file  Other Topics Concern  . Not on file  Social History Narrative  . Not on file   Social Determinants of Health   Financial Resource Strain: Not on file  Food Insecurity: Not on file  Transportation Needs: Not on file  Physical Activity: Not on file  Stress: Not on file  Social Connections: Not on file  Intimate Partner Violence: Not on file    Review of Systems: See HPI, otherwise negative ROS  Impression/Plan: Stephanie Moreno is here for a GIVENS capsule to be performed for iron deficiency anemia.   The risks of the procedure have been reviewed with the patient. Questions have been answered. All parties agreeable.

## 2020-06-22 DIAGNOSIS — G4730 Sleep apnea, unspecified: Secondary | ICD-10-CM | POA: Diagnosis not present

## 2020-06-22 DIAGNOSIS — E1140 Type 2 diabetes mellitus with diabetic neuropathy, unspecified: Secondary | ICD-10-CM | POA: Diagnosis not present

## 2020-06-22 DIAGNOSIS — K5900 Constipation, unspecified: Secondary | ICD-10-CM | POA: Diagnosis not present

## 2020-06-22 DIAGNOSIS — K30 Functional dyspepsia: Secondary | ICD-10-CM | POA: Diagnosis not present

## 2020-06-22 DIAGNOSIS — C50219 Malignant neoplasm of upper-inner quadrant of unspecified female breast: Secondary | ICD-10-CM | POA: Diagnosis not present

## 2020-06-22 DIAGNOSIS — E539 Vitamin B deficiency, unspecified: Secondary | ICD-10-CM | POA: Diagnosis not present

## 2020-06-22 DIAGNOSIS — Z7409 Other reduced mobility: Secondary | ICD-10-CM | POA: Diagnosis not present

## 2020-06-22 DIAGNOSIS — G2581 Restless legs syndrome: Secondary | ICD-10-CM | POA: Diagnosis not present

## 2020-06-22 DIAGNOSIS — E1122 Type 2 diabetes mellitus with diabetic chronic kidney disease: Secondary | ICD-10-CM | POA: Diagnosis not present

## 2020-06-22 DIAGNOSIS — N1831 Chronic kidney disease, stage 3a: Secondary | ICD-10-CM | POA: Diagnosis not present

## 2020-06-23 DIAGNOSIS — E539 Vitamin B deficiency, unspecified: Secondary | ICD-10-CM | POA: Diagnosis not present

## 2020-06-23 DIAGNOSIS — Z7984 Long term (current) use of oral hypoglycemic drugs: Secondary | ICD-10-CM | POA: Diagnosis not present

## 2020-06-23 DIAGNOSIS — N3946 Mixed incontinence: Secondary | ICD-10-CM | POA: Diagnosis not present

## 2020-06-23 DIAGNOSIS — G4733 Obstructive sleep apnea (adult) (pediatric): Secondary | ICD-10-CM | POA: Diagnosis not present

## 2020-06-23 DIAGNOSIS — E1122 Type 2 diabetes mellitus with diabetic chronic kidney disease: Secondary | ICD-10-CM | POA: Diagnosis not present

## 2020-06-23 DIAGNOSIS — N3289 Other specified disorders of bladder: Secondary | ICD-10-CM | POA: Diagnosis not present

## 2020-06-23 DIAGNOSIS — E782 Mixed hyperlipidemia: Secondary | ICD-10-CM | POA: Diagnosis not present

## 2020-06-23 DIAGNOSIS — Z7982 Long term (current) use of aspirin: Secondary | ICD-10-CM | POA: Diagnosis not present

## 2020-06-23 DIAGNOSIS — N1832 Chronic kidney disease, stage 3b: Secondary | ICD-10-CM | POA: Diagnosis not present

## 2020-06-23 DIAGNOSIS — I129 Hypertensive chronic kidney disease with stage 1 through stage 4 chronic kidney disease, or unspecified chronic kidney disease: Secondary | ICD-10-CM | POA: Diagnosis not present

## 2020-06-24 DIAGNOSIS — E1122 Type 2 diabetes mellitus with diabetic chronic kidney disease: Secondary | ICD-10-CM | POA: Diagnosis not present

## 2020-06-28 ENCOUNTER — Telehealth: Payer: Self-pay | Admitting: Gastroenterology

## 2020-06-28 DIAGNOSIS — D509 Iron deficiency anemia, unspecified: Secondary | ICD-10-CM

## 2020-06-28 NOTE — Op Note (Addendum)
   Small Bowel Givens Capsule Study Procedure date:  06/14/20  Referring Provider:  Lewie Loron, NP PCP:  Dr. Roe Rutherford, NP  Indication for procedure: 74 year old female with history of iron deficiency anemia presenting for capsule endoscopy. Colonoscopy and EGD completed  August 2021 with internal hemorrhoids on colonoscopy andbenign-appearing esophageal stenosis s/p dilation, gastritis s/p biopsy with no H. pylori, normal duodenum. Negative H.pylori, negative duodenal biopsy.  Patient data:  Wt: 179 pounds Ht: 5 foot 3 inches  Findings: Small bowel study complete.  Capsule reached cecum.  No masses, active bleeding.  She had a few erosions in the proximal small bowel (examples include at 38 minutes 31 seconds, 1 hour 8 minutes 59 seconds, 1 hour 11 minutes 36 seconds).  Very small polypoid type lesion on small bowel fold with overlying mucosa appearing normal.  Lesion noted in several images, initially at 1 hour 23 minutes and 6 seconds.  Benign-appearing venous bleb noted at 2 hours 7 minutes and 56 seconds.  At 4 hours 30 minutes and 36 seconds, some polypoid type tissue noted, likely insignificant.  First Gastric image: 2 minutes 40 seconds First Duodenal image: 33 minutes 29 seconds First Ileo-Cecal Valve image: 4 hours 51 minutes 20 seconds First Cecal image: 4 hours 51 minutes 23 seconds Gastric Passage time: 30 minutes  Small Bowel Passage time: 4 hours 17 minutes   Summary & Recommendations:  Small bowel mucosa seen overall unremarkable.  Few nonbleeding erosions in the proximal small bowel.  Single polypoid type lesion noted with normal-appearing overlying mucosa as outlined above.  No masses or active bleeding.  Pertinent images reviewed with Dr. Jena Gauss who felt the images were insignificant.  1. Would recommend continued follow-up with nephrology for mixed anemia of chronic disease/IDA.  Anemia remains mild at this time except for low iron stores. 2. She can resume iron  as directed by nephrology. 3. We will have her follow-up with Lewie Loron, NP  Leanna Battles. Dixon Boos [facility] [phone] 12/27/20214:03 PM   Attending note: Pertinent images reviewed.

## 2020-06-28 NOTE — Telephone Encounter (Signed)
Please let patient know that her small bowel capsule study showed the following:   Small bowel mucosa seen overall unremarkable.  Few nonbleeding erosions in the proximal small bowel.  Single polypoid type lesion noted with normal-appearing overlying mucosa as outlined above.  No masses or active bleeding.  Pertinent images reviewed with Dr. Gala Romney who felt the images were insignificant.  1. Would recommend continued follow-up with nephrology for mixed anemia of chronic disease/IDA.  Anemia remains mild at this time except for low iron stores. 2. She can resume iron as directed by nephrology. 3. We will have her follow-up with Roseanne Kaufman, NP    [place], NP LET HER KNOW TO FOLLOW UP WITH NEPHROLOGY AS PLANNED. SHE CAN RESTART IRON. NO FURTHER GI WORK UP INDICATED AT THIS TIME.

## 2020-06-29 ENCOUNTER — Telehealth: Payer: Self-pay | Admitting: Internal Medicine

## 2020-06-29 DIAGNOSIS — Z9013 Acquired absence of bilateral breasts and nipples: Secondary | ICD-10-CM | POA: Diagnosis not present

## 2020-06-29 DIAGNOSIS — C50219 Malignant neoplasm of upper-inner quadrant of unspecified female breast: Secondary | ICD-10-CM | POA: Diagnosis not present

## 2020-06-29 DIAGNOSIS — K5900 Constipation, unspecified: Secondary | ICD-10-CM | POA: Diagnosis not present

## 2020-06-29 DIAGNOSIS — G4730 Sleep apnea, unspecified: Secondary | ICD-10-CM | POA: Diagnosis not present

## 2020-06-29 DIAGNOSIS — K30 Functional dyspepsia: Secondary | ICD-10-CM | POA: Diagnosis not present

## 2020-06-29 DIAGNOSIS — R350 Frequency of micturition: Secondary | ICD-10-CM | POA: Diagnosis not present

## 2020-06-29 DIAGNOSIS — Z90710 Acquired absence of both cervix and uterus: Secondary | ICD-10-CM | POA: Diagnosis not present

## 2020-06-29 DIAGNOSIS — E539 Vitamin B deficiency, unspecified: Secondary | ICD-10-CM | POA: Diagnosis not present

## 2020-06-29 DIAGNOSIS — E1122 Type 2 diabetes mellitus with diabetic chronic kidney disease: Secondary | ICD-10-CM | POA: Diagnosis not present

## 2020-06-29 DIAGNOSIS — R945 Abnormal results of liver function studies: Secondary | ICD-10-CM | POA: Diagnosis not present

## 2020-06-29 NOTE — Telephone Encounter (Signed)
Phoned pt back @ 9:49 am her phone just continuously rang. No vm

## 2020-06-29 NOTE — Telephone Encounter (Signed)
PATIENT RETURNED CALL  PLEASE CALL BACK  [phone]

## 2020-06-29 NOTE — Telephone Encounter (Signed)
See other note (pt calls)

## 2020-06-29 NOTE — Telephone Encounter (Signed)
Phoned the pt @ 8:07 am here phone just rang. No vm to leave a message. Will try another time to call her.

## 2020-07-01 NOTE — Progress Notes (Signed)
Dear Stephanie Moreno,  We have tried to contact you on several occasions regarding your results and Dr's instructions on what to do next.  Please give our office a call at your earliest convenience to discuss these matters.   Thank you,  Westley Foots, CMA

## 2020-07-01 NOTE — Telephone Encounter (Signed)
Letter sent to pt

## 2020-07-02 DIAGNOSIS — N1831 Chronic kidney disease, stage 3a: Secondary | ICD-10-CM | POA: Diagnosis not present

## 2020-07-02 DIAGNOSIS — E7849 Other hyperlipidemia: Secondary | ICD-10-CM | POA: Diagnosis not present

## 2020-07-02 DIAGNOSIS — E1122 Type 2 diabetes mellitus with diabetic chronic kidney disease: Secondary | ICD-10-CM | POA: Diagnosis not present

## 2020-07-08 ENCOUNTER — Ambulatory Visit (INDEPENDENT_AMBULATORY_CARE_PROVIDER_SITE_OTHER): Payer: Medicare HMO

## 2020-07-08 ENCOUNTER — Other Ambulatory Visit: Payer: Self-pay

## 2020-07-08 ENCOUNTER — Other Ambulatory Visit: Payer: Self-pay | Admitting: Podiatry

## 2020-07-08 ENCOUNTER — Ambulatory Visit: Payer: Medicare HMO | Admitting: Podiatry

## 2020-07-08 ENCOUNTER — Other Ambulatory Visit: Payer: Self-pay | Admitting: "Endocrinology

## 2020-07-08 DIAGNOSIS — E1142 Type 2 diabetes mellitus with diabetic polyneuropathy: Secondary | ICD-10-CM

## 2020-07-08 DIAGNOSIS — M79671 Pain in right foot: Secondary | ICD-10-CM

## 2020-07-08 DIAGNOSIS — M79672 Pain in left foot: Secondary | ICD-10-CM

## 2020-07-08 MED ORDER — GABAPENTIN 300 MG PO CAPS: 300.0000 mg | ORAL_CAPSULE | Freq: Every day | ORAL | 3 refills | Status: DC

## 2020-07-08 NOTE — Progress Notes (Signed)
Subjective:  Patient ID: Stephanie Moreno, female    DOB: [birth year],  MRN: [id number] HPI Chief Complaint  Patient presents with  . Pain    RT plantar forefoot and Lt medial and lateral ankle pain and burning x 2-3 mo; 7/10 -w swelling  and tingling Tx; elevation     75 y.o. female presents with the above complaint.   ROS: Denies fever chills nausea vomiting muscle aches pains calf pain back pains shortness of breath.  Past Medical History:  Diagnosis Date  . Acid reflux disease   . Breast cancer ([place])   . Diabetes mellitus without complication ([place])   . Diverticulitis   . Hypertension   . Sleep apnea   . Thyroid disease    Past Surgical History:  Procedure Laterality Date  . BIOPSY  02/19/2020   Procedure: BIOPSY;  Surgeon: Eloise Harman, DO;  Location: [facility];  Service: Endoscopy;;  . BREAST SURGERY    . COLONOSCOPY WITH PROPOFOL N/A 02/19/2020   non-bleeding internal hemorrhoids, many small-mouthed diverticula in entire colon.  . ESOPHAGOGASTRODUODENOSCOPY (EGD) WITH PROPOFOL N/A 02/19/2020    benign-appearing esophageal stenosis s/p dilation, gastritis s/p biopsy, normal duodenum. Negative H.pylori, negative duodenal biopsy.   Marland Kitchen GIVENS CAPSULE STUDY N/A 06/14/2020   Procedure: GIVENS CAPSULE STUDY;  Surgeon: Eloise Harman, DO;  Location: [facility];  Service: Endoscopy;  Laterality: N/A;  7:30am  . THYROIDECTOMY    . tummy tuck     at time of breast surgery 2003    Current Outpatient Medications:  .  ferrous sulfate 325 (65 FE) MG EC tablet, Take by mouth., Disp: , Rfl:  .  gabapentin (NEURONTIN) 300 MG capsule, Take 1 capsule (300 mg total) by mouth at bedtime., Disp: 30 capsule, Rfl: 3 .  acetaminophen (TYLENOL) 500 MG tablet, Take 1,000 mg by mouth as needed for moderate pain. , Disp: , Rfl:  .  amitriptyline (ELAVIL) 25 MG tablet, Take 25 mg by mouth at bedtime., Disp: , Rfl:  .  amLODipine (NORVASC) 10 MG tablet, Take 10 mg by mouth daily., Disp: ,  Rfl:  .  aspirin EC 81 MG tablet, Take 81 mg by mouth every morning. Swallow whole., Disp: , Rfl:  .  beclomethasone (QVAR) 80 MCG/ACT inhaler, Inhale 1 puff into the lungs 2 (two) times daily as needed (shortness of breath)., Disp: , Rfl:  .  Blood Glucose Monitoring Suppl (ACCU-CHEK GUIDE ME) w/Device KIT, 1 Piece by Does not apply route as directed., Disp: 1 kit, Rfl: 0 .  cyclobenzaprine (FLEXERIL) 10 MG tablet, Take 10 mg by mouth 3 (three) times daily as needed., Disp: , Rfl:  .  Dulaglutide (TRULICITY) 1.5 ZD/6.3OV SOPN, Inject 1.5 mg into the skin every Monday., Disp: , Rfl:  .  furosemide (LASIX) 40 MG tablet, Take 40 mg by mouth daily., Disp: , Rfl:  .  glipiZIDE (GLUCOTROL XL) 5 MG 24 hr tablet, Take 2 tablets (10 mg total) by mouth daily with breakfast., Disp: 60 tablet, Rfl: 3 .  GLUCOSAMINE-CHONDROITIN PO, Take 1 tablet by mouth daily., Disp: , Rfl:  .  glucose blood (ACCU-CHEK GUIDE) test strip, Use as instructed, Disp: 150 each, Rfl: 2 .  lidocaine (LIDODERM) 5 %, Place 1 patch onto the skin daily as needed (pain). Remove & Discard patch within 12 hours or as directed by MD. Applied to lower back/shoulders/neck for pain , Disp: , Rfl:  .  lidocaine (XYLOCAINE) 2 % solution, , Disp: , Rfl:  .  linaclotide (LINZESS) 72 MCG capsule, Take 1 capsule (72 mcg total) by mouth daily before breakfast., Disp: 90 capsule, Rfl: 3 .  losartan (COZAAR) 50 MG tablet, Take 100 mg by mouth daily., Disp: , Rfl:  .  magnesium oxide (MAG-OX) 400 MG tablet, Take 400 mg by mouth daily., Disp: , Rfl:  .  meclizine (ANTIVERT) 25 MG tablet, Take 25 mg by mouth 3 (three) times daily as needed for dizziness., Disp: , Rfl:  .  metFORMIN (GLUCOPHAGE) 500 MG tablet, Take 2 tablets (1,000 mg total) by mouth daily with breakfast., Disp: 30 tablet, Rfl: 3 .  metoprolol tartrate (LOPRESSOR) 50 MG tablet, Take 50 mg by mouth 2 (two) times daily., Disp: , Rfl:  .  Naphazoline HCl (CLEAR EYES OP), Place 1 drop into both  eyes daily as needed (dry eyes)., Disp: , Rfl:  .  omeprazole (PRILOSEC) 40 MG capsule, Take 40 mg by mouth daily., Disp: , Rfl:  .  pantoprazole (PROTONIX) 40 MG tablet, Take 1 tablet (40 mg total) by mouth daily. 30 minutes before breakfast, Disp: 90 tablet, Rfl: 3 .  polyethylene glycol (MIRALAX / GLYCOLAX) 17 g packet, Take 17 g by mouth daily as needed for moderate constipation., Disp: , Rfl:  .  pregabalin (LYRICA) 50 MG capsule, Take 50 mg by mouth 2 (two) times daily., Disp: , Rfl:  .  rosuvastatin (CRESTOR) 10 MG tablet, Take 10 mg by mouth at bedtime. , Disp: , Rfl:  .  sucralfate (CARAFATE) 1 GM/10ML suspension, Take 1.5 g by mouth 3 (three) times daily as needed (indigestion). , Disp: , Rfl:  .  verapamil (VERELAN PM) 180 MG 24 hr capsule, Take 180 mg by mouth daily., Disp: , Rfl:   Allergies  Allergen Reactions  . Codeine Nausea And Vomiting  . Lisinopril Nausea And Vomiting  . Penicillins Hives   Review of Systems Objective:  There were no vitals filed for this visit.  General: Well developed, nourished, in no acute distress, alert and oriented x3   Dermatological: Skin is warm, dry and supple bilateral. Nails x 10 are well maintained; remaining integument appears unremarkable at this time. There are no open sores, no preulcerative lesions, no rash or signs of infection present.  Hyperpigmented areas plantar aspect of the bilateral foot this is nothing to be worried about.  Vascular: Dorsalis Pedis artery and Posterior Tibial artery pedal pulses are 2/4 bilateral with immedate capillary fill time. Pedal hair growth present. No varicosities and no lower extremity edema present bilateral.   Neruologic: Grossly intact via light touch bilateral. Vibratory intact via tuning fork bilateral. Protective threshold with Semmes Wienstein monofilament diminished to forefoot sites bilateral. Patellar and Achilles deep tendon reflexes 2+ bilateral. No Babinski or clonus noted bilateral.    Musculoskeletal: No gross boney pedal deformities bilateral. No pain, crepitus, or limitation noted with foot and ankle range of motion bilateral. Muscular strength 5/5 in all groups tested bilateral.  Mild hammertoe deformities flexible in nature bilateral.  Gait: Unassisted, Nonantalgic.    Radiographs:  Radiographs taken today demonstrate an osseously mature individual with moderate severe osteopenia.  No acute findings are noted.  Mild midfoot arthropathy.  Otherwise rectus foot type minimal hammertoe deformity.  Assessment & Plan:   Assessment: Diabetic peripheral neuropathy  Plan: I am going to schedule her with [facility] for set of diabetic shoes.  I also started her on gabapentin 300 mg 1 p.o. nightly for her nighttime pain I will follow-up with her in 1 month.  Garrel Ridgel, DPM

## 2020-07-12 ENCOUNTER — Ambulatory Visit (INDEPENDENT_AMBULATORY_CARE_PROVIDER_SITE_OTHER): Payer: Medicare HMO | Admitting: "Endocrinology

## 2020-07-12 ENCOUNTER — Encounter: Payer: Self-pay | Admitting: "Endocrinology

## 2020-07-12 ENCOUNTER — Other Ambulatory Visit: Payer: Self-pay

## 2020-07-12 VITALS — BP 144/88 | HR 80 | Ht 63.0 in | Wt 179.4 lb

## 2020-07-12 DIAGNOSIS — N1832 Chronic kidney disease, stage 3b: Secondary | ICD-10-CM | POA: Diagnosis not present

## 2020-07-12 DIAGNOSIS — E782 Mixed hyperlipidemia: Secondary | ICD-10-CM

## 2020-07-12 DIAGNOSIS — E119 Type 2 diabetes mellitus without complications: Secondary | ICD-10-CM | POA: Insufficient documentation

## 2020-07-12 DIAGNOSIS — I10 Essential (primary) hypertension: Secondary | ICD-10-CM | POA: Diagnosis not present

## 2020-07-12 DIAGNOSIS — E1122 Type 2 diabetes mellitus with diabetic chronic kidney disease: Secondary | ICD-10-CM

## 2020-07-12 LAB — POCT GLYCOSYLATED HEMOGLOBIN (HGB A1C): HbA1c, POC (controlled diabetic range): 8.6 % — AB (ref 0.0–7.0)

## 2020-07-12 MED ORDER — BD PEN NEEDLE SHORT U/F 31G X 8 MM MISC: 1.0000 | 3 refills | Status: DC

## 2020-07-12 MED ORDER — TRESIBA FLEXTOUCH 100 UNIT/ML ~~LOC~~ SOPN: 20.0000 [IU] | PEN_INJECTOR | Freq: Every day | SUBCUTANEOUS | 1 refills | Status: DC

## 2020-07-12 MED ORDER — GLIPIZIDE ER 5 MG PO TB24
5.0000 mg | ORAL_TABLET | Freq: Every day | ORAL | 1 refills | Status: DC
Start: 2020-07-12 — End: 2020-09-24

## 2020-07-12 MED ORDER — METFORMIN HCL 500 MG PO TABS
500.0000 mg | ORAL_TABLET | Freq: Every day | ORAL | 1 refills | Status: DC
Start: 2020-07-12 — End: 2020-10-07

## 2020-07-12 NOTE — Patient Instructions (Addendum)

## 2020-07-12 NOTE — Progress Notes (Signed)
07/12/2020, 11:54 AM  Endocrinology follow-up note   Subjective:    Patient ID: Stephanie Moreno, female    DOB: [birth year].  Stephanie Moreno is being seen in follow-up after she was seen in consultation for management of currently uncontrolled symptomatic diabetes requested by  Pablo Lawrence, NP (Inactive).   Past Medical History:  Diagnosis Date  . Acid reflux disease   . Breast cancer ([place])   . Diabetes mellitus without complication ([place])   . Diverticulitis   . Hypertension   . Sleep apnea   . Thyroid disease     Past Surgical History:  Procedure Laterality Date  . BIOPSY  02/19/2020   Procedure: BIOPSY;  Surgeon: Eloise Harman, DO;  Location: [facility];  Service: Endoscopy;;  . BREAST SURGERY    . COLONOSCOPY WITH PROPOFOL N/A 02/19/2020   non-bleeding internal hemorrhoids, many small-mouthed diverticula in entire colon.  . ESOPHAGOGASTRODUODENOSCOPY (EGD) WITH PROPOFOL N/A 02/19/2020    benign-appearing esophageal stenosis s/p dilation, gastritis s/p biopsy, normal duodenum. Negative H.pylori, negative duodenal biopsy.   Marland Kitchen GIVENS CAPSULE STUDY N/A 06/14/2020   Procedure: GIVENS CAPSULE STUDY;  Surgeon: Eloise Harman, DO;  Location: [facility];  Service: Endoscopy;  Laterality: N/A;  7:30am  . THYROIDECTOMY    . tummy tuck     at time of breast surgery 2003    Social History   Socioeconomic History  . Marital status: Divorced    Spouse name: Not on file  . Number of children: Not on file  . Years of education: Not on file  . Highest education level: Not on file  Occupational History  . 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 Smoking status: Never Smoker  . Smokeless tobacco: Never Used  Substance and Sexual Activity  . Alcohol use: Not Currently  . Drug use: Not Currently  . Sexual activity: Not on file  Other Topics Concern  . Not on file  Social History Narrative  . Not on  file   Social Determinants of Health   Financial Resource Strain: Not on file  Food Insecurity: Not on file  Transportation Needs: Not on file  Physical Activity: Not on file  Stress: Not on file  Social Connections: Not on file    Family History  Problem Relation Age of Onset  . Diabetes Mother   . Thyroid disease Mother   . Colon cancer Neg Hx     Outpatient Encounter Medications as of 07/12/2020  Medication Sig  . insulin degludec (TRESIBA FLEXTOUCH) 100 UNIT/ML FlexTouch Pen Inject 20 Units into the skin daily.  . Insulin Pen Needle (B-D ULTRAFINE III SHORT PEN) 31G X 8 MM MISC 1 each by Does not apply route as directed.  Marland Kitchen acetaminophen (TYLENOL) 500 MG tablet Take 1,000 mg by mouth as needed for moderate pain.   Marland Kitchen amitriptyline (ELAVIL) 25 MG tablet Take 25 mg by mouth at bedtime.  Marland Kitchen amLODipine (NORVASC) 10 MG tablet Take 10 mg by mouth daily.  Marland Kitchen aspirin EC 81 MG tablet Take 81 mg by mouth every morning. Swallow whole.  . beclomethasone (QVAR) 80 MCG/ACT inhaler Inhale 1 puff into the lungs 2 (two) times daily as needed (shortness of breath).  . Blood Glucose Monitoring Suppl (ACCU-CHEK GUIDE ME) w/Device KIT 1 Piece by Does not apply route as directed.  . cyclobenzaprine (FLEXERIL) 10 MG tablet Take 10  mg by mouth 3 (three) times daily as needed.  . Dulaglutide (TRULICITY) 1.5 IE/3.3IR SOPN Inject 1.5 mg into the skin every Monday.  . ferrous sulfate 325 (65 FE) MG EC tablet Take by mouth.  . furosemide (LASIX) 40 MG tablet Take 40 mg by mouth daily.  Marland Kitchen gabapentin (NEURONTIN) 300 MG capsule Take 1 capsule (300 mg total) by mouth at bedtime.  Marland Kitchen glipiZIDE (GLUCOTROL XL) 5 MG 24 hr tablet Take 1 tablet (5 mg total) by mouth daily with breakfast.  . GLUCOSAMINE-CHONDROITIN PO Take 1 tablet by mouth daily.  Marland Kitchen glucose blood (ACCU-CHEK GUIDE) test strip Use as instructed  . lidocaine (LIDODERM) 5 % Place 1 patch onto the skin daily as needed (pain). Remove & Discard patch within  12 hours or as directed by MD. Applied to lower back/shoulders/neck for pain   . lidocaine (XYLOCAINE) 2 % solution   . linaclotide (LINZESS) 72 MCG capsule Take 1 capsule (72 mcg total) by mouth daily before breakfast.  . losartan (COZAAR) 50 MG tablet Take 100 mg by mouth daily.  . magnesium oxide (MAG-OX) 400 MG tablet Take 400 mg by mouth daily.  . meclizine (ANTIVERT) 25 MG tablet Take 25 mg by mouth 3 (three) times daily as needed for dizziness.  . metFORMIN (GLUCOPHAGE) 500 MG tablet Take 1 tablet (500 mg total) by mouth daily with breakfast.  . metoprolol tartrate (LOPRESSOR) 50 MG tablet Take 50 mg by mouth 2 (two) times daily.  . Naphazoline HCl (CLEAR EYES OP) Place 1 drop into both eyes daily as needed (dry eyes).  . pantoprazole (PROTONIX) 40 MG tablet Take 1 tablet (40 mg total) by mouth daily. 30 minutes before breakfast  . polyethylene glycol (MIRALAX / GLYCOLAX) 17 g packet Take 17 g by mouth daily as needed for moderate constipation.  . rosuvastatin (CRESTOR) 10 MG tablet Take 10 mg by mouth at bedtime.   . sucralfate (CARAFATE) 1 GM/10ML suspension Take 1.5 g by mouth 3 (three) times daily as needed (indigestion).   . verapamil (VERELAN PM) 180 MG 24 hr capsule Take 180 mg by mouth daily.  . [DISCONTINUED] glipiZIDE (GLUCOTROL XL) 5 MG 24 hr tablet Take 2 tablets (10 mg total) by mouth daily with breakfast.  . [DISCONTINUED] metFORMIN (GLUCOPHAGE) 500 MG tablet Take 2 tablets (1,000 mg total) by mouth daily with breakfast.  . [DISCONTINUED] omeprazole (PRILOSEC) 40 MG capsule Take 40 mg by mouth daily.  . [DISCONTINUED] pregabalin (LYRICA) 50 MG capsule Take 50 mg by mouth 2 (two) times daily.   No facility-administered encounter medications on file as of 07/12/2020.    ALLERGIES: Allergies  Allergen Reactions  . Codeine Nausea And Vomiting  . Lisinopril Nausea And Vomiting  . Penicillins Hives    VACCINATION STATUS:  There is no immunization history on file for this  patient.  Diabetes She presents for her follow-up diabetic visit. She has type 2 diabetes mellitus. Onset time: She was diagnosed at approximate age of 65 years. Her disease course has been worsening. She does not report any hypoglycemic episodes.   Associated symptoms include fatigue, foot paresthesias, polydipsia and polyuria. Pertinent negatives for diabetes include no chest pain and no polyphagia. There are no hypoglycemic complications. Symptoms are improving. Diabetic complications include a CVA and nephropathy. Risk factors for coronary artery disease include diabetes mellitus, dyslipidemia, family history, obesity, hypertension, sedentary lifestyle and post-menopausal. Current diabetic treatments: She is currently on Trulicity 1.5 mg weekly, glipizide 10 mg p.o. daily, Metformin 1000 mg  p.o. daily.  She has steady weight since last visit.She is following a generally unhealthy diet. When asked about meal planning, she reported none. She has not had a previous visit with a dietitian. She never participates in exercise. Her home blood glucose trend is fluctuating minimally. Her breakfast blood glucose range is generally 140-180 mg/dl at fasting, significantly above target postprandially. -Her point-of-care A1c is 8.6%, unchanged from 8.8% during her last visit.  An ACE inhibitor/angiotensin II receptor blocker is being taken.  Hyperlipidemia This is a chronic problem. The current episode started more than 1 year ago. The problem is uncontrolled. Exacerbating diseases include chronic renal disease, diabetes and obesity. Pertinent negatives include no chest pain, myalgias or shortness of breath. Current antihyperlipidemic treatment includes statins. Risk factors for coronary artery disease include diabetes mellitus, dyslipidemia, hypertension, a sedentary lifestyle, post-menopausal and family history.  Hypertension This is a chronic problem. The current episode started more than 1 year ago. The problem  is uncontrolled. Pertinent negatives include no chest pain, headaches, palpitations or shortness of breath. Risk factors for coronary artery disease include diabetes mellitus, dyslipidemia, obesity, sedentary lifestyle and post-menopausal state. Past treatments include angiotensin blockers. Hypertensive end-organ damage includes kidney disease and CVA. Identifiable causes of hypertension include chronic renal disease.     Review of Systems  Constitutional: Positive for fatigue. Negative for chills, fever and unexpected weight change.  HENT: Negative for trouble swallowing and voice change.   Eyes: Negative for visual disturbance.  Respiratory: Negative for cough, shortness of breath and wheezing.   Cardiovascular: Negative for chest pain, palpitations and leg swelling.  Gastrointestinal: Negative for diarrhea, nausea and vomiting.  Endocrine: Positive for polydipsia and polyuria. Negative for cold intolerance, heat intolerance and polyphagia.  Musculoskeletal: Positive for gait problem. Negative for arthralgias and myalgias.       Patient uses a walker to ambulate due to disequilibrium.  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  Skin: Negative for color change, pallor, rash and wound.  Neurological: Negative for seizures and headaches.  Psychiatric/Behavioral: Negative for confusion and suicidal ideas.    Objective:    Vitals with BMI 07/12/2020 06/14/2020 05/21/2020  Height 5' 3"  5' 3"  5' 3"   Weight 179 lbs 6 oz 183 lbs 172 lbs 3 oz  BMI 31.79 10.25 85.27  Systolic 782 - -  Diastolic 88 - -  Pulse 80 - -    BP (!) 144/88   Pulse 80   Ht 5' 3"  (1.6 m)   Wt 179 lb 6.4 oz (81.4 kg)   BMI 31.78 kg/m   Wt Readings from Last 3 Encounters:  07/12/20 179 lb 6.4 oz (81.4 kg)  06/14/20 183 lb (83 kg)  05/21/20 172 lb 3.2 oz (78.1 kg)    Musculoskeletal:        General: Normal range of motion.     Cervical back: Normal range of motion and neck supple.      Comments: Patient uses a walker to ambulate due to disequilibrium.  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  Skin:    General: Skin is warm and dry.     Coloration: Skin is not pale.     Findings: No erythema or rash.  Neurological:     Mental Status: She is alert and oriented to person, place, and time.     Cranial Nerves: No cranial nerve deficit.     Coordination: Coordination normal.     Deep Tendon  Reflexes: Reflexes are normal and symmetric.  Psychiatric:        Judgment: Judgment normal.       CMP ( most recent) CMP     Component Value Date/Time   NA 137 01/28/2020 1257   K 3.7 01/28/2020 1257   CL 103 01/28/2020 1257   CO2 25 01/28/2020 1257   GLUCOSE 129 (H) 01/28/2020 1257   BUN 9 03/09/2020 0000   CREATININE 1.5 (A) 03/09/2020 0000   CREATININE 1.10 (H) 01/28/2020 1257   CALCIUM 9.0 01/28/2020 1257   PROT 8.1 04/27/2020 1515   ALBUMIN 3.6 04/12/2020 1423   AST 38 (H) 04/27/2020 1515   ALT 26 04/27/2020 1515   ALKPHOS 76 04/12/2020 1423   BILITOT 0.3 04/27/2020 1515   GFRNONAA 35 03/09/2020 0000   GFRAA 40 03/09/2020 0000     Diabetic Labs (most recent): Lab Results  Component Value Date   HGBA1C 8.6 (A) 07/12/2020   HGBA1C 8.8 03/09/2020     Lipid Panel ( most recent) Lipid Panel     Component Value Date/Time   CHOL 186 03/09/2020 0000   TRIG 110 03/09/2020 0000   HDL 57 03/09/2020 0000   LDLCALC 109 03/09/2020 0000      Lab Results  Component Value Date   TSH 1.18 03/09/2020      Assessment & Plan:   1. Type 2 diabetes mellitus with stage 3b chronic kidney disease, without long-term current use of insulin ([employer])  - Stephanie Moreno has currently uncontrolled symptomatic type 2 DM since  75 years of age.  She returns with still above target glycemic profile both fasting and postprandial.    No hypoglycemia was documented.   Recent labs reviewed.   - I had a long discussion with her about the progressive nature of  diabetes and the pathology behind its complications. -her diabetes is complicated by CKD, CVA and she remains at a high risk for more acute and chronic complications which include CAD, CVA, CKD, retinopathy, and neuropathy. These are all discussed in detail with her.  - I have counseled her on diet  and weight management  by adopting a carbohydrate restricted/protein rich diet. Patient is encouraged to switch to  unprocessed or minimally processed     complex starch and increased protein intake (animal or plant source), fruits, and vegetables. -  she is advised to stick to a routine mealtimes to eat 3 meals  a day and avoid unnecessary snacks ( to snack only to correct hypoglycemia).   - she acknowledges that there is a room for improvement in her food and drink choices. - Suggestion is made for her to avoid simple carbohydrates  from her diet including Cakes, Sweet Desserts, Ice Cream, Soda (diet and regular), Sweet Tea, Candies, Chips, Cookies, Store Bought Juices, Alcohol in Excess of  1-2 drinks a day, Artificial Sweeteners,  Coffee Creamer, and "Sugar-free" Products, Lemonade. This will help patient to have more stable blood glucose profile and potentially avoid unintended weight gain.   - she will be scheduled with Jearld Fenton, RDN, CDE for diabetes education.  - I have approached her with the following individualized plan to manage  her diabetes and patient agrees:   - she will need at least basal insulin in order for her to achieve control of diabetes to target, and she accepts this treatment option at this time.   -I demonstrated insulin injection technique in the exam room.  I gave her a sample of [place]  20 units nightly, associated with monitoring of blood glucose 4 times a day-before meals and at bedtime and return for evaluation in 10 to 15 days.   - she is encouraged to call clinic for blood glucose levels less than 70 or above 300 mg /dl. - she is advised to continue Trulicity 1.5  mg subcutaneously weekly,  therapeutically suitable for patient . - her Metformin will be adjusted at 500 mg p.o. twice daily, lower her glipizide to 5 mg XL p.o. daily at breakfast.      - Specific targets for  A1c;  LDL, HDL,  and Triglycerides were discussed with the patient.  2) Blood Pressure /Hypertension:   Her blood pressure is controlled to target.   she is advised to continue her current medications including losartan 100 mg p.o. daily with breakfast . 3) Lipids/Hyperlipidemia:   Review of her recent lipid panel showed uncontrolled  LDL at 109 .  she  is advised to continue Crestor 10 mg p.o. daily at bedtime.   Side effects and precautions discussed with her.  4)  Weight/Diet:  Body mass index is 31.78 kg/m.  -   clearly complicating her diabetes care.   she is  a candidate for weight loss. I discussed with her the fact that loss of 5 - 10% of her  current body weight will have the most impact on her diabetes management.  Exercise, and detailed carbohydrates information provided  -  detailed on discharge instructions.  5) Chronic Care/Health Maintenance:  -she  is on ACEI/ARB and Statin medications and  is encouraged to initiate and continue to follow up with Ophthalmology, Dentist,  Podiatrist at least yearly or according to recommendations, and advised to  stay away from smoking. I have recommended yearly flu vaccine and pneumonia vaccine at least every 5 years; she cannot exercise optimally due to her disequilibrium.  She is advised to sleep for at least 7 hours a day.  - she is  advised to maintain close follow up with Pablo Lawrence, NP (Inactive) for primary care needs, as well as her other providers for optimal and coordinated care.  - Time spent on this patient care encounter:  35 min, of which > 50% was spent in  counseling and the rest reviewing her blood glucose logs , discussing her hypoglycemia and hyperglycemia episodes, reviewing her current and  previous labs /  studies  ( including abstraction from other facilities) and medications  doses and developing a  long term treatment plan and documenting her care.   Please refer to Patient Instructions for Blood Glucose Monitoring and Insulin/Medications Dosing Guide"  in media tab for additional information. Please  also refer to " Patient Self Inventory" in the Media  tab for reviewed elements of pertinent patient history.  Stephanie Moreno participated in the discussions, expressed understanding, and voiced agreement with the above plans.  All questions were answered to her satisfaction. she is encouraged to contact clinic should she have any questions or concerns prior to her return visit.   Follow up plan: - Return in about 2 weeks (around 07/26/2020) for F/U with Meter and Logs Only - no Labs.  Glade Lloyd, MD [facility] [address] [place], [place] [address] Phone: [phone]  Fax: [phone]    07/12/2020, 11:54 AM  This note was partially dictated with voice recognition software. Similar sounding words can be transcribed inadequately or may not  be corrected upon review. Tyler Aas

## 2020-07-13 ENCOUNTER — Telehealth: Payer: Self-pay | Admitting: Internal Medicine

## 2020-07-13 NOTE — Telephone Encounter (Signed)
[phone]    Patient called because she received a letter we were trying to reach her

## 2020-07-13 NOTE — Telephone Encounter (Signed)
Pt returned call and I had advised of her results/notes from December. Pt states she had got with her nephrologist and she's doing pretty good at the moment. Not bothered with any abd.pain, constipation, or diarrhea. From her last ov note in November she was released to return in 6 to 8 months. I advised that she can return before then if she has any issues but she is declining an ov visit at this time.

## 2020-07-16 ENCOUNTER — Ambulatory Visit: Payer: Medicaid - Out of State | Admitting: Dietician

## 2020-07-20 ENCOUNTER — Other Ambulatory Visit (HOSPITAL_COMMUNITY): Payer: Self-pay | Admitting: Internal Medicine

## 2020-07-20 ENCOUNTER — Telehealth: Payer: Self-pay

## 2020-07-20 DIAGNOSIS — Z1231 Encounter for screening mammogram for malignant neoplasm of breast: Secondary | ICD-10-CM

## 2020-07-20 NOTE — Telephone Encounter (Signed)
1/13 AT BREAKFAST WAS 203 ( THIS WAS FASTING) 1/14 135 AT BREAKFAST 1/15 303 AFTER LUNCH She has not been able to check it anymore since her meter broke. She does have a new one called in.

## 2020-07-20 NOTE — Telephone Encounter (Signed)
I spoke with patient she is going to find out if they still have script on file for glucometer and let office know if a new script needs to be sent in for a new glucometer.

## 2020-07-20 NOTE — Telephone Encounter (Signed)
Patient is feeling drowsy, woozy after taking Gabapentin 300 mg prescribed. Please advise.

## 2020-07-20 NOTE — Telephone Encounter (Signed)
This will usually subside after taking it for a while but if she feels better taking it then may want to continue or drop down to two hundered milligrams in which case you will have to change to 100mg  two po qhs.

## 2020-07-22 ENCOUNTER — Other Ambulatory Visit: Payer: Self-pay

## 2020-07-22 ENCOUNTER — Telehealth: Payer: Self-pay

## 2020-07-22 DIAGNOSIS — K760 Fatty (change of) liver, not elsewhere classified: Secondary | ICD-10-CM

## 2020-07-22 MED ORDER — GABAPENTIN 100 MG PO CAPS: 200.0000 mg | ORAL_CAPSULE | Freq: Every day | ORAL | 3 refills | Status: DC

## 2020-07-22 NOTE — Telephone Encounter (Signed)
Returned call-  She says she is feeling very drowsy through the day and making balance unstable. I advised I will send in the 100mg  capsules and for her to try 200mg  at night time to see if this helps. She said she would try this. Verified pharmacy and sent in gabapentin 100mg  - taking 200mg  [facility] to [facility]

## 2020-07-22 NOTE — Telephone Encounter (Signed)
LAB FORMS MAILED TO PT FOR Stephanie Moreno

## 2020-07-24 DIAGNOSIS — E782 Mixed hyperlipidemia: Secondary | ICD-10-CM | POA: Diagnosis not present

## 2020-07-24 DIAGNOSIS — E539 Vitamin B deficiency, unspecified: Secondary | ICD-10-CM | POA: Diagnosis not present

## 2020-07-24 DIAGNOSIS — Z7982 Long term (current) use of aspirin: Secondary | ICD-10-CM | POA: Diagnosis not present

## 2020-07-24 DIAGNOSIS — G4733 Obstructive sleep apnea (adult) (pediatric): Secondary | ICD-10-CM | POA: Diagnosis not present

## 2020-07-24 DIAGNOSIS — I129 Hypertensive chronic kidney disease with stage 1 through stage 4 chronic kidney disease, or unspecified chronic kidney disease: Secondary | ICD-10-CM | POA: Diagnosis not present

## 2020-07-24 DIAGNOSIS — E1122 Type 2 diabetes mellitus with diabetic chronic kidney disease: Secondary | ICD-10-CM | POA: Diagnosis not present

## 2020-07-24 DIAGNOSIS — N1832 Chronic kidney disease, stage 3b: Secondary | ICD-10-CM | POA: Diagnosis not present

## 2020-07-24 DIAGNOSIS — E1140 Type 2 diabetes mellitus with diabetic neuropathy, unspecified: Secondary | ICD-10-CM | POA: Diagnosis not present

## 2020-07-24 DIAGNOSIS — N3289 Other specified disorders of bladder: Secondary | ICD-10-CM | POA: Diagnosis not present

## 2020-07-24 DIAGNOSIS — N3946 Mixed incontinence: Secondary | ICD-10-CM | POA: Diagnosis not present

## 2020-07-26 ENCOUNTER — Encounter: Payer: Self-pay | Admitting: Nurse Practitioner

## 2020-07-26 ENCOUNTER — Other Ambulatory Visit: Payer: Self-pay

## 2020-07-26 ENCOUNTER — Ambulatory Visit: Payer: Medicare HMO | Admitting: Nurse Practitioner

## 2020-07-26 VITALS — BP 145/80 | HR 78 | Wt 184.6 lb

## 2020-07-26 DIAGNOSIS — I10 Essential (primary) hypertension: Secondary | ICD-10-CM | POA: Diagnosis not present

## 2020-07-26 DIAGNOSIS — N1832 Chronic kidney disease, stage 3b: Secondary | ICD-10-CM

## 2020-07-26 DIAGNOSIS — E782 Mixed hyperlipidemia: Secondary | ICD-10-CM | POA: Diagnosis not present

## 2020-07-26 DIAGNOSIS — E1122 Type 2 diabetes mellitus with diabetic chronic kidney disease: Secondary | ICD-10-CM | POA: Diagnosis not present

## 2020-07-26 MED ORDER — TRESIBA FLEXTOUCH 100 UNIT/ML ~~LOC~~ SOPN: 30.0000 [IU] | PEN_INJECTOR | Freq: Every day | SUBCUTANEOUS | 1 refills | Status: DC

## 2020-07-26 NOTE — Progress Notes (Signed)
07/26/2020, 1:36 PM  Endocrinology follow-up note   Subjective:    Patient ID: Stephanie Moreno, female    DOB: March 28, 1946.  Stephanie Moreno is being seen in follow-up after she was seen in consultation for management of currently uncontrolled symptomatic diabetes requested by  Pablo Lawrence, NP (Inactive).   Past Medical History:  Diagnosis Date  . Acid reflux disease   . Breast cancer ([place])   . Diabetes mellitus without complication ([place])   . Diverticulitis   . Hypertension   . Sleep apnea   . Thyroid disease     Past Surgical History:  Procedure Laterality Date  . BIOPSY  02/19/2020   Procedure: BIOPSY;  Surgeon: Eloise Harman, DO;  Location: [facility];  Service: Endoscopy;;  . BREAST SURGERY    . COLONOSCOPY WITH PROPOFOL N/A 02/19/2020   non-bleeding internal hemorrhoids, many small-mouthed diverticula in entire colon.  . ESOPHAGOGASTRODUODENOSCOPY (EGD) WITH PROPOFOL N/A 02/19/2020    benign-appearing esophageal stenosis s/p dilation, gastritis s/p biopsy, normal duodenum. Negative H.pylori, negative duodenal biopsy.   Marland Kitchen GIVENS CAPSULE STUDY N/A 06/14/2020   Procedure: GIVENS CAPSULE STUDY;  Surgeon: Eloise Harman, DO;  Location: [facility];  Service: Endoscopy;  Laterality: N/A;  7:30am  . THYROIDECTOMY    . tummy tuck     at time of breast surgery 2003    Social History   Socioeconomic History  . Marital status: Divorced    Spouse name: Not on file  . Number of children: Not on file  . Years of education: Not on file  . Highest education level: Not on file  Occupational History  . 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 Smoking status: Never Smoker  . Smokeless tobacco: Never Used  Substance and Sexual Activity  . Alcohol use: Not Currently  . Drug use: Not Currently  . Sexual activity: Not on file  Other Topics Concern  . Not on file  Social History Narrative  . Not on  file   Social Determinants of Health   Financial Resource Strain: Not on file  Food Insecurity: Not on file  Transportation Needs: Not on file  Physical Activity: Not on file  Stress: Not on file  Social Connections: Not on file    Family History  Problem Relation Age of Onset  . Diabetes Mother   . Thyroid disease Mother   . Colon cancer Neg Hx     Outpatient Encounter Medications as of 07/26/2020  Medication Sig  . acetaminophen (TYLENOL) 500 MG tablet Take 1,000 mg by mouth as needed for moderate pain.   Marland Kitchen amitriptyline (ELAVIL) 25 MG tablet Take 25 mg by mouth at bedtime.  Marland Kitchen amLODipine (NORVASC) 10 MG tablet Take 10 mg by mouth daily.  Marland Kitchen aspirin EC 81 MG tablet Take 81 mg by mouth every morning. Swallow whole.  . beclomethasone (QVAR) 80 MCG/ACT inhaler Inhale 1 puff into the lungs 2 (two) times daily as needed (shortness of breath).  . Blood Glucose Monitoring Suppl (ACCU-CHEK GUIDE ME) w/Device KIT 1 Piece by Does not apply route as directed.  . cyclobenzaprine (FLEXERIL) 10 MG tablet Take 10 mg by mouth 3 (three) times daily as needed.  . Dulaglutide (TRULICITY) 1.5 YH/0.6CB SOPN Inject 1.5 mg into the skin every Monday.  . ferrous sulfate 325 (65 FE) MG EC tablet Take by mouth.  . furosemide (LASIX)  40 MG tablet Take 40 mg by mouth daily.  Marland Kitchen gabapentin (NEURONTIN) 100 MG capsule Take 2 capsules (200 mg total) by mouth at bedtime.  Marland Kitchen glipiZIDE (GLUCOTROL XL) 5 MG 24 hr tablet Take 1 tablet (5 mg total) by mouth daily with breakfast.  . Glucosamine-Chondroitin 500-400 MG CAPS Take 1 tablet by mouth daily.  Marland Kitchen GLUCOSAMINE-CHONDROITIN PO Take 1 tablet by mouth daily.  Marland Kitchen glucose blood (ACCU-CHEK GUIDE) test strip Use as instructed  . Insulin Pen Needle (B-D ULTRAFINE III SHORT PEN) 31G X 8 MM MISC 1 each by Does not apply route as directed.  . Lancets (ONETOUCH DELICA PLUS [id number]) MISC Apply topically 3 (three) times daily.  Marland Kitchen lidocaine (LIDODERM) 5 % Place 1 patch onto the  skin daily as needed (pain). Remove & Discard patch within 12 hours or as directed by MD. Applied to lower back/shoulders/neck for pain  . lidocaine (XYLOCAINE) 2 % solution   . linaclotide (LINZESS) 72 MCG capsule Take 1 capsule (72 mcg total) by mouth daily before breakfast.  . losartan (COZAAR) 50 MG tablet Take 100 mg by mouth daily.  . magnesium oxide (MAG-OX) 400 MG tablet Take 400 mg by mouth daily.  . meclizine (ANTIVERT) 25 MG tablet Take 25 mg by mouth 3 (three) times daily as needed for dizziness.  . metFORMIN (GLUCOPHAGE) 500 MG tablet Take 1 tablet (500 mg total) by mouth daily with breakfast.  . metoprolol tartrate (LOPRESSOR) 50 MG tablet Take 50 mg by mouth 2 (two) times daily.  Marland Kitchen MIRALAX 17 GM/SCOOP powder SMARTSIG:17 Gram(s) By Mouth Daily PRN  . MYRBETRIQ 50 MG TB24 tablet Take 50 mg by mouth at bedtime.  . Naphazoline HCl (CLEAR EYES OP) Place 1 drop into both eyes daily as needed (dry eyes).  . OYSTER SHELL CALCIUM PLUS D 500-200 MG-UNIT TABS Take 1 tablet by mouth 3 (three) times daily.  . pantoprazole (PROTONIX) 40 MG tablet Take 1 tablet (40 mg total) by mouth daily. 30 minutes before breakfast  . polyethylene glycol (MIRALAX / GLYCOLAX) 17 g packet Take 17 g by mouth daily as needed for moderate constipation.  . rosuvastatin (CRESTOR) 10 MG tablet Take 10 mg by mouth at bedtime.   . sucralfate (CARAFATE) 1 GM/10ML suspension Take 1.5 g by mouth 3 (three) times daily as needed (indigestion).   . verapamil (VERELAN PM) 180 MG 24 hr capsule Take 180 mg by mouth daily.  . [DISCONTINUED] insulin degludec (TRESIBA FLEXTOUCH) 100 UNIT/ML FlexTouch Pen Inject 20 Units into the skin daily.  . insulin degludec (TRESIBA FLEXTOUCH) 100 UNIT/ML FlexTouch Pen Inject 30 Units into the skin daily.   No facility-administered encounter medications on file as of 07/26/2020.    ALLERGIES: Allergies  Allergen Reactions  . Codeine Nausea And Vomiting  . Lisinopril Nausea And Vomiting  .  Penicillins Hives    VACCINATION STATUS:  There is no immunization history on file for this patient.  Diabetes She presents for her follow-up diabetic visit. She has type 2 diabetes mellitus. Onset time: Diagnosed at approx age of 8. Her disease course has been fluctuating. There are no hypoglycemic associated symptoms. Associated symptoms include fatigue, foot paresthesias, polydipsia and polyuria. There are no hypoglycemic complications. Symptoms are improving. Diabetic complications include a CVA, nephropathy and peripheral neuropathy. Risk factors for coronary artery disease include diabetes mellitus, dyslipidemia, hypertension, post-menopausal and sedentary lifestyle. Current diabetic treatment includes insulin injections and oral agent (dual therapy). She is compliant with treatment most of the time. Her  weight is increasing steadily. She is following a generally unhealthy diet. When asked about meal planning, she reported none. She has not had a previous visit with a dietitian. She rarely participates in exercise. Her home blood glucose trend is fluctuating minimally. Her breakfast blood glucose range is generally 180-200 mg/dl. Her lunch blood glucose range is generally >200 mg/dl. Her dinner blood glucose range is generally >200 mg/dl. Her bedtime blood glucose range is generally 180-200 mg/dl. (She presents today with her meter and logs showing persistently high glycemic profile overall.  Her previous A1c was 8.6%, not checked prior to today's visit.  She was started on basal insulin last visit.  There are no documented or reported episodes of hypoglycemia.) An ACE inhibitor/angiotensin II receptor blocker is being taken. She does not see a podiatrist.Eye exam is current.  Hypertension This is a chronic problem. The current episode started more than 1 year ago. The problem is unchanged. The problem is controlled. Agents associated with hypertension include thyroid hormones. Risk factors for  coronary artery disease include diabetes mellitus, dyslipidemia, post-menopausal state and sedentary lifestyle. Past treatments include calcium channel blockers, diuretics, angiotensin blockers and beta blockers. There are no compliance problems.  Hypertensive end-organ damage includes kidney disease and CVA. Identifiable causes of hypertension include chronic renal disease, sleep apnea and a thyroid problem.  Hyperlipidemia This is a chronic problem. The current episode started more than 1 year ago. The problem is uncontrolled. Recent lipid tests were reviewed and are high. Exacerbating diseases include chronic renal disease and hypothyroidism. Factors aggravating her hyperlipidemia include beta blockers. Current antihyperlipidemic treatment includes statins. The current treatment provides moderate improvement of lipids. There are no compliance problems.  Risk factors for coronary artery disease include diabetes mellitus, dyslipidemia, hypertension, obesity, post-menopausal and a sedentary lifestyle.    Review of systems  Constitutional: + steadily increasing body weight,  current Body mass index is 32.7 kg/m. , + fatigue, no subjective hyperthermia, no subjective hypothermia Eyes: no blurry vision, no xerophthalmia ENT: no sore throat, no nodules palpated in throat, no dysphagia/odynophagia, no hoarseness Cardiovascular: no chest pain, no shortness of breath, no palpitations, no leg swelling Respiratory: no cough, no shortness of breath Gastrointestinal: no nausea/vomiting/diarrhea Musculoskeletal: no muscle/joint aches, walks with walker due to previous CVA and disequilibrium Skin: no rashes, no hyperemia Neurological: no tremors, no dizziness, + numbness/tingling to BLE (on gabapentin) Psychiatric: no depression, no anxiety   Objective:     BP (!) 145/80 (BP Location: Right Arm, Patient Position: Sitting)   Pulse 78   Wt 184 lb 9.6 oz (83.7 kg)   BMI 32.70 kg/m   Wt Readings from Last  3 Encounters:  07/26/20 184 lb 9.6 oz (83.7 kg)  07/12/20 179 lb 6.4 oz (81.4 kg)  06/14/20 183 lb (83 kg)    BP Readings from Last 3 Encounters:  07/26/20 (!) 145/80  07/12/20 (!) 144/88  05/20/20 134/79    Physical Exam- Limited  Constitutional:  Body mass index is 32.7 kg/m. , not in acute distress, normal state of mind Eyes:  EOMI, no exophthalmos Neck: Supple Cardiovascular: RRR, no murmers, rubs, or gallops, no edema Respiratory: Adequate breathing efforts, no crackles, rales, rhonchi, or wheezing Musculoskeletal: no gross deformities, strength intact in all four extremities, no gross restriction of joint movements, walks with walker due to previous CVA and disequilibrium Skin:  no rashes, no hyperemia Neurological: no tremor with outstretched hands    CMP ( most recent) CMP     Component Value  Date/Time   NA 137 01/28/2020 1257   K 3.7 01/28/2020 1257   CL 103 01/28/2020 1257   CO2 25 01/28/2020 1257   GLUCOSE 129 (H) 01/28/2020 1257   BUN 9 03/09/2020 0000   CREATININE 1.5 (A) 03/09/2020 0000   CREATININE 1.10 (H) 01/28/2020 1257   CALCIUM 9.0 01/28/2020 1257   PROT 8.1 04/27/2020 1515   ALBUMIN 3.6 04/12/2020 1423   AST 38 (H) 04/27/2020 1515   ALT 26 04/27/2020 1515   ALKPHOS 76 04/12/2020 1423   BILITOT 0.3 04/27/2020 1515   GFRNONAA 35 03/09/2020 0000   GFRAA 40 03/09/2020 0000     Diabetic Labs (most recent): Lab Results  Component Value Date   HGBA1C 8.6 (A) 07/12/2020   HGBA1C 8.8 03/09/2020     Lipid Panel ( most recent) Lipid Panel     Component Value Date/Time   CHOL 186 03/09/2020 0000   TRIG 110 03/09/2020 0000   HDL 57 03/09/2020 0000   LDLCALC 109 03/09/2020 0000      Lab Results  Component Value Date   TSH 1.18 03/09/2020      Assessment & Plan:   1) Type 2 diabetes mellitus with stage 3b chronic kidney disease, without long-term current use of insulin ([place])  - Stephanie Moreno has currently uncontrolled symptomatic type  2 DM since  75 years of age.  She presents today with her meter and logs showing persistently high glycemic profile overall.  Her previous A1c was 8.6%, not checked prior to today's visit.  She was started on basal insulin last visit.  There are no documented or reported episodes of hypoglycemia.   Recent labs reviewed.    - I had a long discussion with her about the progressive nature of diabetes and the pathology behind its complications. -her diabetes is complicated by CKD, CVA and she remains at a high risk for more acute and chronic complications which include CAD, CVA, CKD, retinopathy, and neuropathy. These are all discussed in detail with her.  - Nutritional counseling repeated at each appointment due to patients tendency to fall back in to old habits.  - The patient admits there is a room for improvement in their diet and drink choices. -  Suggestion is made for the patient to avoid simple carbohydrates from their diet including Cakes, Sweet Desserts / Pastries, Ice Cream, Soda (diet and regular), Sweet Tea, Candies, Chips, Cookies, Sweet Pastries,  Store Bought Juices, Alcohol in Excess of  1-2 drinks a day, Artificial Sweeteners, Coffee Creamer, and "Sugar-free" Products. This will help patient to have stable blood glucose profile and potentially avoid unintended weight gain.   - I encouraged the patient to switch to  unprocessed or minimally processed complex starch and increased protein intake (animal or plant source), fruits, and vegetables.   - Patient is advised to stick to a routine mealtimes to eat 3 meals  a day and avoid unnecessary snacks ( to snack only to correct hypoglycemia).  - she will be scheduled with Jearld Fenton, RDN, CDE for diabetes education.  - I have approached her with the following individualized plan to manage  her diabetes and patient agrees:   - she will need at least basal insulin in order for her to achieve control of diabetes to target, and she  accepts this treatment option at this time.    -Given her persistent fasting hyperglycemia, she will tolerate increase in her Tresiba to 30 units SQ daily at bedtime.  She can  continue Trulicity 1.5 mg SQ weekly, Metformin 500 mg po twice daily with meals and Glipizide 5 mg XL daily with breakfast.  -She is encouraged to continue monitoring blood glucose twice daily, before breakfast and before bed, and to call the clinic if she has readings less than 70 or greater than 200 for 3 tests in a row,  - Specific targets for  A1c;  LDL, HDL,  and Triglycerides were discussed with the patient.  2) Blood Pressure /Hypertension:   Her blood pressure is controlled to target for her age.   she is advised to continue her current medications including losartan 100 mg p.o. daily with breakfast .  3) Lipids/Hyperlipidemia:    Review of her recent lipid panel showed uncontrolled LDL at 109 on 03/04/20.  she  is advised to continue Crestor 10 mg p.o. daily at bedtime.   Side effects and precautions discussed with her.  4)  Weight/Diet:  Her Body mass index is 32.7 kg/m.  -   clearly complicating her diabetes care.   she is  a candidate for weight loss. I discussed with her the fact that loss of 5 - 10% of her  current body weight will have the most impact on her diabetes management.  Exercise, and detailed carbohydrates information provided  -  detailed on discharge instructions.  5) Chronic Care/Health Maintenance: -she  is on ACEI/ARB and Statin medications and  is encouraged to initiate and continue to follow up with Ophthalmology, Dentist,  Podiatrist at least yearly or according to recommendations, and advised to  stay away from smoking. I have recommended yearly flu vaccine and pneumonia vaccine at least every 5 years; she cannot exercise optimally due to her disequilibrium.  She is advised to sleep for at least 7 hours a day.  - she is  advised to maintain close follow up with Pablo Lawrence, NP  (Inactive) for primary care needs, as well as her other providers for optimal and coordinated care.  - Time spent on this patient care encounter:  30 min, of which > 50% was spent in  counseling and the rest reviewing her blood glucose logs , discussing her hypoglycemia and hyperglycemia episodes, reviewing her current and  previous labs / studies  ( including abstraction from other facilities) and medications  doses and developing a  long term treatment plan and documenting her care.   Please refer to Patient Instructions for Blood Glucose Monitoring and Insulin/Medications Dosing Guide"  in media tab for additional information. Please  also refer to " Patient Self Inventory" in the Media  tab for reviewed elements of pertinent patient history.  Stephanie Moreno participated in the discussions, expressed understanding, and voiced agreement with the above plans.  All questions were answered to her satisfaction. she is encouraged to contact clinic should she have any questions or concerns prior to her return visit.   Follow up plan: - Return in about 3 months (around 10/24/2020) for Diabetes follow up with A1c in office, No previsit labs, ABI next visit, Bring glucometer and logs.  Rayetta Pigg, [facility] [address] [place], [place] [address] Phone: [phone] Fax: [phone]   07/26/2020, 1:36 PM

## 2020-07-26 NOTE — Patient Instructions (Signed)
Diabetes Mellitus and Nutrition, Adult When you have diabetes, or diabetes mellitus, it is very important to have healthy eating habits because your blood sugar (glucose) levels are greatly affected by what you eat and drink. Eating healthy foods in the right amounts, at about the same times every day, can help you:  Control your blood glucose.  Lower your risk of heart disease.  Improve your blood pressure.  Reach or maintain a healthy weight. What can affect my meal plan? Every person with diabetes is different, and each person has different needs for a meal plan. Your health care provider may recommend that you work with a dietitian to make a meal plan that is best for you. Your meal plan may vary depending on factors such as:  The calories you need.  The medicines you take.  Your weight.  Your blood glucose, blood pressure, and cholesterol levels.  Your activity level.  Other health conditions you have, such as heart or kidney disease. How do carbohydrates affect me? Carbohydrates, also called carbs, affect your blood glucose level more than any other type of food. Eating carbs naturally raises the amount of glucose in your blood. Carb counting is a method for keeping track of how many carbs you eat. Counting carbs is important to keep your blood glucose at a healthy level, especially if you use insulin or take certain oral diabetes medicines. It is important to know how many carbs you can safely have in each meal. This is different for every person. Your dietitian can help you calculate how many carbs you should have at each meal and for each snack. How does alcohol affect me? Alcohol can cause a sudden decrease in blood glucose (hypoglycemia), especially if you use insulin or take certain oral diabetes medicines. Hypoglycemia can be a life-threatening condition. Symptoms of hypoglycemia, such as sleepiness, dizziness, and confusion, are similar to symptoms of having too much  alcohol.  Do not drink alcohol if: ? Your health care provider tells you not to drink. ? You are pregnant, may be pregnant, or are planning to become pregnant.  If you drink alcohol: ? Do not drink on an empty stomach. ? Limit how much you use to:  0-1 drink a day for women.  0-2 drinks a day for men. ? Be aware of how much alcohol is in your drink. In the [place] one drink equals one 12 oz bottle of beer (355 mL), one 5 oz glass of wine (148 mL), or one 1 oz glass of hard liquor (44 mL). ? Keep yourself hydrated with water, diet soda, or unsweetened iced tea.  Keep in mind that regular soda, juice, and other mixers may contain a lot of sugar and must be counted as carbs. What are tips for following this plan? Reading food labels  Start by checking the serving size on the "Nutrition Facts" label of packaged foods and drinks. The amount of calories, carbs, fats, and other nutrients listed on the label is based on one serving of the item. Many items contain more than one serving per package.  Check the total grams (g) of carbs in one serving. You can calculate the number of servings of carbs in one serving by dividing the total carbs by 15. For example, if a food has 30 g of total carbs per serving, it would be equal to 2 servings of carbs.  Check the number of grams (g) of saturated fats and trans fats in one serving. Choose foods that have   a low amount or none of these fats.  Check the number of milligrams (mg) of salt (sodium) in one serving. Most people should limit total sodium intake to less than 2,300 mg per day.  Always check the nutrition information of foods labeled as "low-fat" or "nonfat." These foods may be higher in added sugar or refined carbs and should be avoided.  Talk to your dietitian to identify your daily goals for nutrients listed on the label. Shopping  Avoid buying canned, pre-made, or processed foods. These foods tend to be high in fat, sodium, and added  sugar.  Shop around the outside edge of the grocery store. This is where you will most often find fresh fruits and vegetables, bulk grains, fresh meats, and fresh dairy. Cooking  Use low-heat cooking methods, such as baking, instead of high-heat cooking methods like deep frying.  Cook using healthy oils, such as olive, canola, or sunflower oil.  Avoid cooking with butter, cream, or high-fat meats. Meal planning  Eat meals and snacks regularly, preferably at the same times every day. Avoid going long periods of time without eating.  Eat foods that are high in fiber, such as fresh fruits, vegetables, beans, and whole grains. Talk with your dietitian about how many servings of carbs you can eat at each meal.  Eat 4-6 oz (112-168 g) of lean protein each day, such as lean meat, chicken, fish, eggs, or tofu. One ounce (oz) of lean protein is equal to: ? 1 oz (28 g) of meat, chicken, or fish. ? 1 egg. ?  cup (62 g) of tofu.  Eat some foods each day that contain healthy fats, such as avocado, nuts, seeds, and fish.   What foods should I eat? Fruits Berries. Apples. Oranges. Peaches. Apricots. Plums. Grapes. Mango. Papaya. Pomegranate. Kiwi. Cherries. Vegetables Lettuce. Spinach. Leafy greens, including kale, chard, collard greens, and mustard greens. Beets. Cauliflower. Cabbage. Broccoli. Carrots. Green beans. Tomatoes. Peppers. Onions. Cucumbers. Brussels sprouts. Grains Whole grains, such as whole-wheat or whole-grain bread, crackers, tortillas, cereal, and pasta. Unsweetened oatmeal. Quinoa. Brown or wild rice. Meats and other proteins Seafood. Poultry without skin. Lean cuts of poultry and beef. Tofu. Nuts. Seeds. Dairy Low-fat or fat-free dairy products such as milk, yogurt, and cheese. The items listed above may not be a complete list of foods and beverages you can eat. Contact a dietitian for more information. What foods should I avoid? Fruits Fruits canned with  syrup. Vegetables Canned vegetables. Frozen vegetables with butter or cream sauce. Grains Refined white flour and flour products such as bread, pasta, snack foods, and cereals. Avoid all processed foods. Meats and other proteins Fatty cuts of meat. Poultry with skin. Breaded or fried meats. Processed meat. Avoid saturated fats. Dairy Full-fat yogurt, cheese, or milk. Beverages Sweetened drinks, such as soda or iced tea. The items listed above may not be a complete list of foods and beverages you should avoid. Contact a dietitian for more information. Questions to ask a health care provider  Do I need to meet with a diabetes educator?  Do I need to meet with a dietitian?  What number can I call if I have questions?  When are the best times to check my blood glucose? Where to find more information:  American Diabetes Association: diabetes.org  Academy of Nutrition and Dietetics: www.eatright.org  National Institute of Diabetes and Digestive and Kidney Diseases: www.niddk.nih.gov  Association of Diabetes Care and Education Specialists: www.diabeteseducator.org Summary  It is important to have healthy eating   habits because your blood sugar (glucose) levels are greatly affected by what you eat and drink.  A healthy meal plan will help you control your blood glucose and maintain a healthy lifestyle.  Your health care provider may recommend that you work with a dietitian to make a meal plan that is best for you.  Keep in mind that carbohydrates (carbs) and alcohol have immediate effects on your blood glucose levels. It is important to count carbs and to use alcohol carefully. This information is not intended to replace advice given to you by your health care provider. Make sure you discuss any questions you have with your health care provider. Document Revised: 05/27/2019 Document Reviewed: 05/27/2019 Elsevier Patient Education  2021 Elsevier Inc.  

## 2020-07-27 ENCOUNTER — Ambulatory Visit: Payer: Medicare HMO | Admitting: Orthotics

## 2020-07-27 DIAGNOSIS — M79672 Pain in left foot: Secondary | ICD-10-CM

## 2020-07-27 DIAGNOSIS — M79671 Pain in right foot: Secondary | ICD-10-CM

## 2020-07-27 DIAGNOSIS — E1142 Type 2 diabetes mellitus with diabetic polyneuropathy: Secondary | ICD-10-CM

## 2020-07-27 NOTE — Progress Notes (Signed)

## 2020-07-29 DIAGNOSIS — E539 Vitamin B deficiency, unspecified: Secondary | ICD-10-CM | POA: Diagnosis not present

## 2020-07-29 DIAGNOSIS — N3289 Other specified disorders of bladder: Secondary | ICD-10-CM | POA: Diagnosis not present

## 2020-07-29 DIAGNOSIS — E782 Mixed hyperlipidemia: Secondary | ICD-10-CM | POA: Diagnosis not present

## 2020-07-29 DIAGNOSIS — E1122 Type 2 diabetes mellitus with diabetic chronic kidney disease: Secondary | ICD-10-CM | POA: Diagnosis not present

## 2020-07-29 DIAGNOSIS — G4733 Obstructive sleep apnea (adult) (pediatric): Secondary | ICD-10-CM | POA: Diagnosis not present

## 2020-07-29 DIAGNOSIS — N3946 Mixed incontinence: Secondary | ICD-10-CM | POA: Diagnosis not present

## 2020-07-29 DIAGNOSIS — I129 Hypertensive chronic kidney disease with stage 1 through stage 4 chronic kidney disease, or unspecified chronic kidney disease: Secondary | ICD-10-CM | POA: Diagnosis not present

## 2020-07-29 DIAGNOSIS — E1140 Type 2 diabetes mellitus with diabetic neuropathy, unspecified: Secondary | ICD-10-CM | POA: Diagnosis not present

## 2020-07-29 DIAGNOSIS — Z7982 Long term (current) use of aspirin: Secondary | ICD-10-CM | POA: Diagnosis not present

## 2020-07-29 DIAGNOSIS — N1832 Chronic kidney disease, stage 3b: Secondary | ICD-10-CM | POA: Diagnosis not present

## 2020-07-31 DIAGNOSIS — N3946 Mixed incontinence: Secondary | ICD-10-CM | POA: Diagnosis not present

## 2020-07-31 DIAGNOSIS — Z853 Personal history of malignant neoplasm of breast: Secondary | ICD-10-CM | POA: Diagnosis not present

## 2020-07-31 DIAGNOSIS — I129 Hypertensive chronic kidney disease with stage 1 through stage 4 chronic kidney disease, or unspecified chronic kidney disease: Secondary | ICD-10-CM | POA: Diagnosis not present

## 2020-07-31 DIAGNOSIS — N1832 Chronic kidney disease, stage 3b: Secondary | ICD-10-CM | POA: Diagnosis not present

## 2020-07-31 DIAGNOSIS — E1122 Type 2 diabetes mellitus with diabetic chronic kidney disease: Secondary | ICD-10-CM | POA: Diagnosis not present

## 2020-07-31 DIAGNOSIS — N3289 Other specified disorders of bladder: Secondary | ICD-10-CM | POA: Diagnosis not present

## 2020-07-31 DIAGNOSIS — M545 Low back pain, unspecified: Secondary | ICD-10-CM | POA: Diagnosis not present

## 2020-07-31 DIAGNOSIS — G4733 Obstructive sleep apnea (adult) (pediatric): Secondary | ICD-10-CM | POA: Diagnosis not present

## 2020-08-02 DIAGNOSIS — G4730 Sleep apnea, unspecified: Secondary | ICD-10-CM | POA: Diagnosis not present

## 2020-08-02 DIAGNOSIS — G2581 Restless legs syndrome: Secondary | ICD-10-CM | POA: Diagnosis not present

## 2020-08-02 DIAGNOSIS — E1122 Type 2 diabetes mellitus with diabetic chronic kidney disease: Secondary | ICD-10-CM | POA: Diagnosis not present

## 2020-08-02 DIAGNOSIS — Z7409 Other reduced mobility: Secondary | ICD-10-CM | POA: Diagnosis not present

## 2020-08-02 DIAGNOSIS — K5900 Constipation, unspecified: Secondary | ICD-10-CM | POA: Diagnosis not present

## 2020-08-02 DIAGNOSIS — C50219 Malignant neoplasm of upper-inner quadrant of unspecified female breast: Secondary | ICD-10-CM | POA: Diagnosis not present

## 2020-08-02 DIAGNOSIS — E1140 Type 2 diabetes mellitus with diabetic neuropathy, unspecified: Secondary | ICD-10-CM | POA: Diagnosis not present

## 2020-08-02 DIAGNOSIS — E539 Vitamin B deficiency, unspecified: Secondary | ICD-10-CM | POA: Diagnosis not present

## 2020-08-02 DIAGNOSIS — K30 Functional dyspepsia: Secondary | ICD-10-CM | POA: Diagnosis not present

## 2020-08-02 DIAGNOSIS — N1831 Chronic kidney disease, stage 3a: Secondary | ICD-10-CM | POA: Diagnosis not present

## 2020-08-03 ENCOUNTER — Ambulatory Visit: Payer: Medicare HMO | Admitting: Podiatry

## 2020-08-04 ENCOUNTER — Other Ambulatory Visit: Payer: Self-pay

## 2020-08-04 ENCOUNTER — Encounter (HOSPITAL_COMMUNITY): Payer: Self-pay

## 2020-08-04 ENCOUNTER — Ambulatory Visit (HOSPITAL_COMMUNITY)
Admission: RE | Admit: 2020-08-04 | Discharge: 2020-08-04 | Disposition: A | Discharge status: Home / Self Care | Payer: Medicare HMO | Source: Ambulatory Visit | Attending: Internal Medicine | Admitting: Internal Medicine

## 2020-08-04 DIAGNOSIS — Z1231 Encounter for screening mammogram for malignant neoplasm of breast: Secondary | ICD-10-CM | POA: Insufficient documentation

## 2020-08-05 DIAGNOSIS — N1832 Chronic kidney disease, stage 3b: Secondary | ICD-10-CM | POA: Diagnosis not present

## 2020-08-05 DIAGNOSIS — E1140 Type 2 diabetes mellitus with diabetic neuropathy, unspecified: Secondary | ICD-10-CM | POA: Diagnosis not present

## 2020-08-05 DIAGNOSIS — I129 Hypertensive chronic kidney disease with stage 1 through stage 4 chronic kidney disease, or unspecified chronic kidney disease: Secondary | ICD-10-CM | POA: Diagnosis not present

## 2020-08-05 DIAGNOSIS — E539 Vitamin B deficiency, unspecified: Secondary | ICD-10-CM | POA: Diagnosis not present

## 2020-08-05 DIAGNOSIS — Z7982 Long term (current) use of aspirin: Secondary | ICD-10-CM | POA: Diagnosis not present

## 2020-08-05 DIAGNOSIS — E782 Mixed hyperlipidemia: Secondary | ICD-10-CM | POA: Diagnosis not present

## 2020-08-05 DIAGNOSIS — N3289 Other specified disorders of bladder: Secondary | ICD-10-CM | POA: Diagnosis not present

## 2020-08-05 DIAGNOSIS — G4733 Obstructive sleep apnea (adult) (pediatric): Secondary | ICD-10-CM | POA: Diagnosis not present

## 2020-08-05 DIAGNOSIS — N3946 Mixed incontinence: Secondary | ICD-10-CM | POA: Diagnosis not present

## 2020-08-05 DIAGNOSIS — E1122 Type 2 diabetes mellitus with diabetic chronic kidney disease: Secondary | ICD-10-CM | POA: Diagnosis not present

## 2020-08-12 ENCOUNTER — Ambulatory Visit (INDEPENDENT_AMBULATORY_CARE_PROVIDER_SITE_OTHER): Payer: Medicare HMO | Admitting: Podiatry

## 2020-08-12 ENCOUNTER — Other Ambulatory Visit: Payer: Self-pay

## 2020-08-12 DIAGNOSIS — E1142 Type 2 diabetes mellitus with diabetic polyneuropathy: Secondary | ICD-10-CM

## 2020-08-12 DIAGNOSIS — B351 Tinea unguium: Secondary | ICD-10-CM

## 2020-08-12 DIAGNOSIS — M79676 Pain in unspecified toe(s): Secondary | ICD-10-CM | POA: Diagnosis not present

## 2020-08-13 DIAGNOSIS — E539 Vitamin B deficiency, unspecified: Secondary | ICD-10-CM | POA: Diagnosis not present

## 2020-08-13 DIAGNOSIS — I129 Hypertensive chronic kidney disease with stage 1 through stage 4 chronic kidney disease, or unspecified chronic kidney disease: Secondary | ICD-10-CM | POA: Diagnosis not present

## 2020-08-13 DIAGNOSIS — E1140 Type 2 diabetes mellitus with diabetic neuropathy, unspecified: Secondary | ICD-10-CM | POA: Diagnosis not present

## 2020-08-13 DIAGNOSIS — N1832 Chronic kidney disease, stage 3b: Secondary | ICD-10-CM | POA: Diagnosis not present

## 2020-08-13 DIAGNOSIS — Z7982 Long term (current) use of aspirin: Secondary | ICD-10-CM | POA: Diagnosis not present

## 2020-08-13 DIAGNOSIS — E1122 Type 2 diabetes mellitus with diabetic chronic kidney disease: Secondary | ICD-10-CM | POA: Diagnosis not present

## 2020-08-13 DIAGNOSIS — E782 Mixed hyperlipidemia: Secondary | ICD-10-CM | POA: Diagnosis not present

## 2020-08-13 DIAGNOSIS — N3946 Mixed incontinence: Secondary | ICD-10-CM | POA: Diagnosis not present

## 2020-08-13 DIAGNOSIS — N3289 Other specified disorders of bladder: Secondary | ICD-10-CM | POA: Diagnosis not present

## 2020-08-13 DIAGNOSIS — G4733 Obstructive sleep apnea (adult) (pediatric): Secondary | ICD-10-CM | POA: Diagnosis not present

## 2020-08-14 NOTE — Progress Notes (Signed)
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  States when she takes 100 mg of the gabapentin it does not work.  But when she takes 200 mg of the gabapentin it works very well but it makes her dizzy.  He states that she only did that for about a week or so she was unable to gain control of her symptoms with the 100 mg.  Objective: Vital signs are stable she is alert and oriented x3.  There is no change in physical exam.  Assessment: Neuropathy bilateral foot.  Plan: At this point I encouraged her to stay on the gabapentin 200 mg try to do this for at least a month and to see if the dizziness will go away most of the time the dizziness will resolve with the gabapentin if it does not we will have to try something else.  States that she had tried pre-[place] before and it did not work for her.  Stating that it made her very sick

## 2020-08-16 DIAGNOSIS — E1140 Type 2 diabetes mellitus with diabetic neuropathy, unspecified: Secondary | ICD-10-CM | POA: Diagnosis not present

## 2020-08-16 DIAGNOSIS — I129 Hypertensive chronic kidney disease with stage 1 through stage 4 chronic kidney disease, or unspecified chronic kidney disease: Secondary | ICD-10-CM | POA: Diagnosis not present

## 2020-08-16 DIAGNOSIS — N1832 Chronic kidney disease, stage 3b: Secondary | ICD-10-CM | POA: Diagnosis not present

## 2020-08-16 DIAGNOSIS — E782 Mixed hyperlipidemia: Secondary | ICD-10-CM | POA: Diagnosis not present

## 2020-08-16 DIAGNOSIS — E539 Vitamin B deficiency, unspecified: Secondary | ICD-10-CM | POA: Diagnosis not present

## 2020-08-16 DIAGNOSIS — N3946 Mixed incontinence: Secondary | ICD-10-CM | POA: Diagnosis not present

## 2020-08-16 DIAGNOSIS — N3289 Other specified disorders of bladder: Secondary | ICD-10-CM | POA: Diagnosis not present

## 2020-08-16 DIAGNOSIS — G4733 Obstructive sleep apnea (adult) (pediatric): Secondary | ICD-10-CM | POA: Diagnosis not present

## 2020-08-16 DIAGNOSIS — E1122 Type 2 diabetes mellitus with diabetic chronic kidney disease: Secondary | ICD-10-CM | POA: Diagnosis not present

## 2020-08-16 DIAGNOSIS — Z7982 Long term (current) use of aspirin: Secondary | ICD-10-CM | POA: Diagnosis not present

## 2020-08-17 ENCOUNTER — Telehealth: Payer: Self-pay | Admitting: Gastroenterology

## 2020-08-17 NOTE — Telephone Encounter (Signed)
Repeat HFP dated 08/02/20:   Tbili 0.3, Alk Phos mildly elevated at 129, AST 28, ALT 22. Would repeat in 6 months. Known fatty liver.

## 2020-08-18 NOTE — Telephone Encounter (Signed)
Noted. Spoke with pt. Pt was notified of results. Lab will be given at next apt as lab orders will expire in 6 months.

## 2020-08-30 DIAGNOSIS — M545 Low back pain, unspecified: Secondary | ICD-10-CM | POA: Diagnosis not present

## 2020-08-30 DIAGNOSIS — N1832 Chronic kidney disease, stage 3b: Secondary | ICD-10-CM | POA: Diagnosis not present

## 2020-08-30 DIAGNOSIS — E1122 Type 2 diabetes mellitus with diabetic chronic kidney disease: Secondary | ICD-10-CM | POA: Diagnosis not present

## 2020-08-30 DIAGNOSIS — N3946 Mixed incontinence: Secondary | ICD-10-CM | POA: Diagnosis not present

## 2020-08-30 DIAGNOSIS — Z853 Personal history of malignant neoplasm of breast: Secondary | ICD-10-CM | POA: Diagnosis not present

## 2020-08-30 DIAGNOSIS — G4733 Obstructive sleep apnea (adult) (pediatric): Secondary | ICD-10-CM | POA: Diagnosis not present

## 2020-08-30 DIAGNOSIS — N3289 Other specified disorders of bladder: Secondary | ICD-10-CM | POA: Diagnosis not present

## 2020-09-06 DIAGNOSIS — E1142 Type 2 diabetes mellitus with diabetic polyneuropathy: Secondary | ICD-10-CM | POA: Diagnosis not present

## 2020-09-06 DIAGNOSIS — D649 Anemia, unspecified: Secondary | ICD-10-CM | POA: Diagnosis not present

## 2020-09-06 DIAGNOSIS — G8929 Other chronic pain: Secondary | ICD-10-CM | POA: Diagnosis not present

## 2020-09-06 DIAGNOSIS — E1122 Type 2 diabetes mellitus with diabetic chronic kidney disease: Secondary | ICD-10-CM | POA: Diagnosis not present

## 2020-09-06 DIAGNOSIS — R768 Other specified abnormal immunological findings in serum: Secondary | ICD-10-CM | POA: Diagnosis not present

## 2020-09-06 DIAGNOSIS — R69 Illness, unspecified: Secondary | ICD-10-CM | POA: Diagnosis not present

## 2020-09-06 DIAGNOSIS — E1129 Type 2 diabetes mellitus with other diabetic kidney complication: Secondary | ICD-10-CM | POA: Diagnosis not present

## 2020-09-06 DIAGNOSIS — J45909 Unspecified asthma, uncomplicated: Secondary | ICD-10-CM | POA: Diagnosis not present

## 2020-09-06 DIAGNOSIS — E669 Obesity, unspecified: Secondary | ICD-10-CM | POA: Diagnosis not present

## 2020-09-06 DIAGNOSIS — R809 Proteinuria, unspecified: Secondary | ICD-10-CM | POA: Diagnosis not present

## 2020-09-06 DIAGNOSIS — I129 Hypertensive chronic kidney disease with stage 1 through stage 4 chronic kidney disease, or unspecified chronic kidney disease: Secondary | ICD-10-CM | POA: Diagnosis not present

## 2020-09-06 DIAGNOSIS — E611 Iron deficiency: Secondary | ICD-10-CM | POA: Diagnosis not present

## 2020-09-06 DIAGNOSIS — N189 Chronic kidney disease, unspecified: Secondary | ICD-10-CM | POA: Diagnosis not present

## 2020-09-06 DIAGNOSIS — E785 Hyperlipidemia, unspecified: Secondary | ICD-10-CM | POA: Diagnosis not present

## 2020-09-06 DIAGNOSIS — Z794 Long term (current) use of insulin: Secondary | ICD-10-CM | POA: Diagnosis not present

## 2020-09-08 DIAGNOSIS — D638 Anemia in other chronic diseases classified elsewhere: Secondary | ICD-10-CM | POA: Diagnosis not present

## 2020-09-08 DIAGNOSIS — N189 Chronic kidney disease, unspecified: Secondary | ICD-10-CM | POA: Diagnosis not present

## 2020-09-08 DIAGNOSIS — R809 Proteinuria, unspecified: Secondary | ICD-10-CM | POA: Diagnosis not present

## 2020-09-08 DIAGNOSIS — E611 Iron deficiency: Secondary | ICD-10-CM | POA: Diagnosis not present

## 2020-09-08 DIAGNOSIS — E1122 Type 2 diabetes mellitus with diabetic chronic kidney disease: Secondary | ICD-10-CM | POA: Diagnosis not present

## 2020-09-08 DIAGNOSIS — I129 Hypertensive chronic kidney disease with stage 1 through stage 4 chronic kidney disease, or unspecified chronic kidney disease: Secondary | ICD-10-CM | POA: Diagnosis not present

## 2020-09-08 DIAGNOSIS — E1129 Type 2 diabetes mellitus with other diabetic kidney complication: Secondary | ICD-10-CM | POA: Diagnosis not present

## 2020-09-13 ENCOUNTER — Other Ambulatory Visit: Payer: Self-pay

## 2020-09-13 ENCOUNTER — Ambulatory Visit (INDEPENDENT_AMBULATORY_CARE_PROVIDER_SITE_OTHER): Payer: Medicare HMO | Admitting: Podiatry

## 2020-09-13 DIAGNOSIS — M79672 Pain in left foot: Secondary | ICD-10-CM

## 2020-09-13 DIAGNOSIS — E1142 Type 2 diabetes mellitus with diabetic polyneuropathy: Secondary | ICD-10-CM

## 2020-09-13 DIAGNOSIS — M79671 Pain in right foot: Secondary | ICD-10-CM

## 2020-09-13 NOTE — Progress Notes (Signed)
The patient presented to the office to day to pick up diabetic shoes and 3 pair diabetic custom inserts.  1 pair of inserts were put in the shoes and the shoes were fitted to the patient. The patient stated they are comfortable on the left foot but the right foot swells and the shoe was too tight.  We are sending the shoes [id number]  womens 9 1/2 wide back and order 822 in a 9 1/2 wide.  Patient will be contacted when the diabetic shoes are ready for pick up.

## 2020-09-17 ENCOUNTER — Other Ambulatory Visit: Payer: Self-pay

## 2020-09-17 ENCOUNTER — Ambulatory Visit
Admission: EM | Admit: 2020-09-17 | Discharge: 2020-09-17 | Disposition: A | Discharge status: Home / Self Care | Payer: Medicare HMO | Attending: Emergency Medicine | Admitting: Emergency Medicine

## 2020-09-17 ENCOUNTER — Encounter: Payer: Self-pay | Admitting: Emergency Medicine

## 2020-09-17 DIAGNOSIS — R300 Dysuria: Secondary | ICD-10-CM | POA: Insufficient documentation

## 2020-09-17 LAB — POCT URINALYSIS DIP (MANUAL ENTRY)
Bilirubin, UA: NEGATIVE
Glucose, UA: 250 mg/dL — AB
Ketones, POC UA: NEGATIVE mg/dL
Nitrite, UA: NEGATIVE
Spec Grav, UA: 1.02 (ref 1.010–1.025)
Urobilinogen, UA: 0.2 E.U./dL
pH, UA: 6.5 (ref 5.0–8.0)

## 2020-09-17 MED ORDER — PHENAZOPYRIDINE HCL 200 MG PO TABS: 200.0000 mg | ORAL_TABLET | Freq: Three times a day (TID) | ORAL | 0 refills | Status: DC

## 2020-09-17 MED ORDER — SULFAMETHOXAZOLE-TRIMETHOPRIM 800-160 MG PO TABS: 1.0000 | ORAL_TABLET | Freq: Two times a day (BID) | ORAL | 0 refills | Status: AC

## 2020-09-17 NOTE — ED Provider Notes (Signed)
[facility]   CC: Burning with urination  SUBJECTIVE:  Stephanie Moreno is a 75 y.o. female who complains of trouble with urination that began this morning.  Patient denies a precipitating event, recent sexual encounter, excessive caffeine intake.  Complains of bladder pressure.  Has tried OTC tylenol without relief.  Symptoms are made worse with urination.  Admits to similar symptoms in the past UTI.  Denies fever, chills, nausea, vomiting, abdominal pain, flank pain, abnormal vaginal discharge or bleeding, hematuria.    LMP: No LMP recorded. Patient has had a hysterectomy.  ROS: As in HPI.  All other pertinent ROS negative.     Past Medical History:  Diagnosis Date  . Acid reflux disease   . Breast cancer ([place])    left side  . Diabetes mellitus without complication ([place])   . Diverticulitis   . Hypertension   . Sleep apnea   . Thyroid disease    Past Surgical History:  Procedure Laterality Date  . BIOPSY  02/19/2020   Procedure: BIOPSY;  Surgeon: Eloise Harman, DO;  Location: [facility];  Service: Endoscopy;;  . BREAST LUMPECTOMY Left 2003  . BREAST SURGERY    . COLONOSCOPY WITH PROPOFOL N/A 02/19/2020   non-bleeding internal hemorrhoids, many small-mouthed diverticula in entire colon.  . ESOPHAGOGASTRODUODENOSCOPY (EGD) WITH PROPOFOL N/A 02/19/2020    benign-appearing esophageal stenosis s/p dilation, gastritis s/p biopsy, normal duodenum. Negative H.pylori, negative duodenal biopsy.   Marland Kitchen GIVENS CAPSULE STUDY N/A 06/14/2020   Procedure: GIVENS CAPSULE STUDY;  Surgeon: Eloise Harman, DO;  Location: [facility];  Service: Endoscopy;  Laterality: N/A;  7:30am  . THYROIDECTOMY    . tummy tuck     at time of breast surgery 2003   Allergies  Allergen Reactions  . Codeine Nausea And Vomiting  . Lisinopril Nausea And Vomiting  . Penicillins Hives   No current facility-administered medications on file prior to encounter.   Current Outpatient Medications on  File Prior to Encounter  Medication Sig Dispense Refill  . acetaminophen (TYLENOL) 500 MG tablet Take 1,000 mg by mouth as needed for moderate pain.     Marland Kitchen amitriptyline (ELAVIL) 25 MG tablet Take 25 mg by mouth at bedtime.    Marland Kitchen amLODipine (NORVASC) 10 MG tablet Take 10 mg by mouth daily.    Marland Kitchen aspirin EC 81 MG tablet Take 81 mg by mouth every morning. Swallow whole.    . beclomethasone (QVAR) 80 MCG/ACT inhaler Inhale 1 puff into the lungs 2 (two) times daily as needed (shortness of breath).    . Blood Glucose Monitoring Suppl (ACCU-CHEK GUIDE ME) w/Device KIT 1 Piece by Does not apply route as directed. 1 kit 0  . cyclobenzaprine (FLEXERIL) 10 MG tablet Take 10 mg by mouth 3 (three) times daily as needed.    . Dulaglutide (TRULICITY) 1.5 LG/9.2JJ SOPN Inject 1.5 mg into the skin every Monday.    . ferrous sulfate 325 (65 FE) MG EC tablet Take by mouth.    . furosemide (LASIX) 40 MG tablet Take 40 mg by mouth daily.    Marland Kitchen gabapentin (NEURONTIN) 100 MG capsule Take 2 capsules (200 mg total) by mouth at bedtime. 60 capsule 3  . glipiZIDE (GLUCOTROL XL) 5 MG 24 hr tablet Take 1 tablet (5 mg total) by mouth daily with breakfast. 90 tablet 1  . Glucosamine-Chondroitin 500-400 MG CAPS Take 1 tablet by mouth daily.    Marland Kitchen GLUCOSAMINE-CHONDROITIN PO Take 1 tablet by mouth  daily.    . glucose blood (ACCU-CHEK GUIDE) test strip Use as instructed 150 each 2  . insulin degludec (TRESIBA FLEXTOUCH) 100 UNIT/ML FlexTouch Pen Inject 30 Units into the skin daily. 15 mL 1  . Insulin Pen Needle (B-D ULTRAFINE III SHORT PEN) 31G X 8 MM MISC 1 each by Does not apply route as directed. 100 each 3  . Lancets (ONETOUCH DELICA PLUS [id number]) MISC Apply topically 3 (three) times daily.    Marland Kitchen lidocaine (LIDODERM) 5 % Place 1 patch onto the skin daily as needed (pain). Remove & Discard patch within 12 hours or as directed by MD. Applied to lower back/shoulders/neck for pain    . lidocaine (XYLOCAINE) 2 % solution     .  linaclotide (LINZESS) 72 MCG capsule Take 1 capsule (72 mcg total) by mouth daily before breakfast. 90 capsule 3  . losartan (COZAAR) 50 MG tablet Take 100 mg by mouth daily.    . magnesium oxide (MAG-OX) 400 MG tablet Take 400 mg by mouth daily.    . meclizine (ANTIVERT) 25 MG tablet Take 25 mg by mouth 3 (three) times daily as needed for dizziness.    . metFORMIN (GLUCOPHAGE) 500 MG tablet Take 1 tablet (500 mg total) by mouth daily with breakfast. 90 tablet 1  . metoprolol tartrate (LOPRESSOR) 50 MG tablet Take 50 mg by mouth 2 (two) times daily.    Marland Kitchen MIRALAX 17 GM/SCOOP powder SMARTSIG:17 Gram(s) By Mouth Daily PRN    . MYRBETRIQ 50 MG TB24 tablet Take 50 mg by mouth at bedtime.    . Naphazoline HCl (CLEAR EYES OP) Place 1 drop into both eyes daily as needed (dry eyes).    . OYSTER SHELL CALCIUM PLUS D 500-200 MG-UNIT TABS Take 1 tablet by mouth 3 (three) times daily.    . pantoprazole (PROTONIX) 40 MG tablet Take 1 tablet (40 mg total) by mouth daily. 30 minutes before breakfast 90 tablet 3  . polyethylene glycol (MIRALAX / GLYCOLAX) 17 g packet Take 17 g by mouth daily as needed for moderate constipation.    . rosuvastatin (CRESTOR) 10 MG tablet Take 10 mg by mouth at bedtime.     . sucralfate (CARAFATE) 1 GM/10ML suspension Take 1.5 g by mouth 3 (three) times daily as needed (indigestion).     . verapamil (VERELAN PM) 180 MG 24 hr capsule Take 180 mg by mouth daily.     Social History   Socioeconomic History  . Marital status: Divorced    Spouse name: Not on file  . Number of children: Not on file  . Years of education: Not on file  . Highest education level: Not on file  Occupational History  . 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 Smoking status: Never Smoker  . Smokeless tobacco: Never Used  Substance and Sexual Activity  . Alcohol use: Not Currently  . Drug use: Not Currently  . Sexual activity: Not on file  Other Topics  Concern  . Not on file  Social History Narrative  . Not on file   Social Determinants of Health   Financial Resource Strain: Not on file  Food Insecurity: Not on file  Transportation Needs: Not on file  Physical Activity: Not on file  Stress: Not on file  Social Connections: Not on file  Intimate Partner Violence: Not on file   Family History  Problem Relation Age of Onset  . Diabetes Mother   .  Thyroid disease Mother   . Colon cancer Neg Hx     OBJECTIVE:  Vitals:   09/17/20 1608  BP: (!) 148/83  Pulse: 89  Resp: 16  Temp: 98.8 F (37.1 C)  TempSrc: Oral  SpO2: 98%   General appearance: Alert in no acute distress HEENT: NCAT.  Oropharynx clear.  Lungs: clear to auscultation bilaterally without adventitious breath sounds Heart: regular rate and rhythm.   Abdomen: soft; non-distended; no tenderness; bowel sounds present; no guarding Back: no CVA tenderness Extremities: no edema; symmetrical with no gross deformities Skin: warm and dry Neurologic: Ambulates with walker Psychological: alert and cooperative; normal mood and affect  Labs Reviewed  POCT URINALYSIS DIP (MANUAL ENTRY) - Abnormal; Notable for the following components:      Result Value   Glucose, UA =250 (*)    Blood, UA trace-intact (*)    Protein Ur, POC trace (*)    Leukocytes, UA Trace (*)    All other components within normal limits  URINE CULTURE    ASSESSMENT & PLAN:  1. Dysuria     Meds ordered this encounter  Medications  . sulfamethoxazole-trimethoprim (BACTRIM DS) 800-160 MG tablet    Sig: Take 1 tablet by mouth 2 (two) times daily for 7 days.    Dispense:  14 tablet    Refill:  0    Order Specific Question:   Supervising Provider    Answer:   Raylene Everts [[id number]]  . phenazopyridine (PYRIDIUM) 200 MG tablet    Sig: Take 1 tablet (200 mg total) by mouth 3 (three) times daily.    Dispense:  6 tablet    Refill:  0    Order Specific Question:   Supervising Provider     Answer:   Raylene Everts [[id number]]   Urine concerning for UTI Urine culture sent.  We will call you with abnormal results.   Push fluids and get plenty of rest.   Take antibiotic as directed and to completion Take pyridium as prescribed and as needed for symptomatic relief Follow up with PCP if symptoms persists Return here or go to ER if you have any new or worsening symptoms such as fever, worsening abdominal pain, nausea/vomiting, flank pain, etc...  Outlined signs and symptoms indicating need for more acute intervention. Patient verbalized understanding. After Visit Summary given.     Lestine Box, PA-C 09/17/20 1645

## 2020-09-17 NOTE — Discharge Instructions (Signed)
Urine concerning for UTI Urine culture sent.  We will call you with abnormal results.   Push fluids and get plenty of rest.   Take antibiotic as directed and to completion Take pyridium as prescribed and as needed for symptomatic relief Follow up with PCP if symptoms persists Return here or go to ER if you have any new or worsening symptoms such as fever, worsening abdominal pain, nausea/vomiting, flank pain, etc..Marland Kitchen

## 2020-09-17 NOTE — ED Triage Notes (Signed)
Provider triage  

## 2020-09-19 LAB — URINE CULTURE: Culture: <10000 — AB

## 2020-09-22 ENCOUNTER — Ambulatory Visit (INDEPENDENT_AMBULATORY_CARE_PROVIDER_SITE_OTHER): Payer: Medicaid - Out of State | Admitting: Nurse Practitioner

## 2020-09-23 ENCOUNTER — Other Ambulatory Visit: Payer: Self-pay

## 2020-09-23 ENCOUNTER — Telehealth: Payer: Self-pay | Admitting: Nurse Practitioner

## 2020-09-23 ENCOUNTER — Ambulatory Visit: Payer: Medicare HMO | Admitting: Podiatry

## 2020-09-23 DIAGNOSIS — R609 Edema, unspecified: Secondary | ICD-10-CM | POA: Diagnosis not present

## 2020-09-23 DIAGNOSIS — E1142 Type 2 diabetes mellitus with diabetic polyneuropathy: Secondary | ICD-10-CM

## 2020-09-23 NOTE — Telephone Encounter (Signed)
Attempted to call again, no answer, no VM

## 2020-09-23 NOTE — Telephone Encounter (Signed)
Pt is calling in regards to low readings. She is requesting nurse call back [phone]

## 2020-09-23 NOTE — Telephone Encounter (Signed)
Returned call to patient , no answer and no vm . Will call back

## 2020-09-23 NOTE — Telephone Encounter (Signed)
Returned call and spoke with patient advised to only use 30 units , verbalized understanding.

## 2020-09-23 NOTE — Telephone Encounter (Signed)
I went back to see what I wrote on the diabetes summary for her and I advised her to increase her Tresiba to 30 (not 35 units) as I had started her on relatively low dose to see how she responded.  Advise her to take the 30 units for now.

## 2020-09-23 NOTE — Telephone Encounter (Signed)
Spoke with patient, she stated that at her last visit she was already taking [place] 30 units and you told her to increase to 35 units, her BG are as follows, 3/24 am 68, 3/23 am 214, pm 113, 3/22 am 93, pm 176, 3/21 am 159, pm 184, 3/20 am 216, pm 235. It shows that in her last note to take tresiba 30 units? Please advise.

## 2020-09-24 ENCOUNTER — Other Ambulatory Visit: Payer: Self-pay | Admitting: "Endocrinology

## 2020-09-24 ENCOUNTER — Ambulatory Visit (HOSPITAL_COMMUNITY): Payer: Medicare HMO

## 2020-09-26 NOTE — Progress Notes (Signed)
Subjective: 75 year old female presents the office today for follow-up evaluation of neuropathy bilaterally.  She has been treated for this by Dr. Milinda Pointer.  She is on the gabapentin been doing well.  Her concern today is she is having chronic swelling to her ankles.  She has some occasional wheezing but no chest pain or shortness of breath otherwise.  Objective: AAO x3, NAD DP/PT pulses palpable bilaterally, CRT less than 3 seconds Not able to elicit any area of pinpoint tenderness.  Flexor, extensor tendons appear to be intact.  MMT 5/5.  Bilateral chronic lower extremity edema present with pitting edema.  There is no erythema or warmth there is no open lesions or drainage or any signs of infection. No pain with calf compression, swelling, warmth, erythema  Assessment: Bilateral edema; neuropathy  Plan: -All treatment options discussed with the patient including all alternatives, risks, complications.  -Continue gabapentin as prescribed by Dr. Milinda Pointer -Venous duplex for swelling.  Recommend her follow-up with her primary care physician as well. -Patient encouraged to call the office with any questions, concerns, change in symptoms.   Trula Slade DPM

## 2020-09-27 ENCOUNTER — Other Ambulatory Visit: Payer: Self-pay

## 2020-09-27 ENCOUNTER — Encounter (HOSPITAL_COMMUNITY): Payer: Self-pay

## 2020-09-27 ENCOUNTER — Encounter (HOSPITAL_COMMUNITY)
Admission: RE | Admit: 2020-09-27 | Discharge: 2020-09-27 | Disposition: A | Discharge status: Home / Self Care | Payer: Medicare HMO | Source: Ambulatory Visit | Attending: Nephrology | Admitting: Nephrology

## 2020-09-27 DIAGNOSIS — D509 Iron deficiency anemia, unspecified: Secondary | ICD-10-CM | POA: Diagnosis not present

## 2020-09-27 MED ORDER — SODIUM CHLORIDE 0.9 % IV SOLN: Freq: Once | INTRAVENOUS | Status: AC

## 2020-09-27 MED ORDER — SODIUM CHLORIDE 0.9 % IV SOLN
200.0000 mg | Freq: Once | INTRAVENOUS | Status: AC
  Administered 2020-09-27: 200 mg via INTRAVENOUS
  Filled 2020-09-27: qty 10

## 2020-09-28 ENCOUNTER — Encounter (HOSPITAL_COMMUNITY): Payer: Self-pay

## 2020-09-28 ENCOUNTER — Encounter (HOSPITAL_COMMUNITY)
Admission: RE | Admit: 2020-09-28 | Discharge: 2020-09-28 | Disposition: A | Discharge status: Home / Self Care | Payer: Medicare HMO | Source: Ambulatory Visit | Attending: Nephrology | Admitting: Nephrology

## 2020-09-28 DIAGNOSIS — D509 Iron deficiency anemia, unspecified: Secondary | ICD-10-CM | POA: Diagnosis not present

## 2020-09-28 HISTORY — DX: Anemia, unspecified: D64.9

## 2020-09-28 HISTORY — DX: Chronic kidney disease, unspecified: N18.9

## 2020-09-28 MED ORDER — SODIUM CHLORIDE 0.9 % IV SOLN
200.0000 mg | Freq: Once | INTRAVENOUS | Status: AC
  Administered 2020-09-28: 200 mg via INTRAVENOUS
  Filled 2020-09-28: qty 10

## 2020-09-28 MED ORDER — SODIUM CHLORIDE 0.9 % IV SOLN: Freq: Once | INTRAVENOUS | Status: AC

## 2020-09-29 ENCOUNTER — Ambulatory Visit (HOSPITAL_COMMUNITY)
Admission: RE | Admit: 2020-09-29 | Discharge: 2020-09-29 | Disposition: A | Discharge status: Home / Self Care | Payer: Medicare HMO | Source: Ambulatory Visit | Attending: Podiatry | Admitting: Podiatry

## 2020-09-29 ENCOUNTER — Encounter (HOSPITAL_COMMUNITY)
Admission: RE | Admit: 2020-09-29 | Discharge: 2020-09-29 | Disposition: A | Discharge status: Home / Self Care | Payer: Medicare HMO | Source: Ambulatory Visit | Attending: Nephrology | Admitting: Nephrology

## 2020-09-29 ENCOUNTER — Telehealth: Payer: Self-pay | Admitting: Podiatry

## 2020-09-29 ENCOUNTER — Other Ambulatory Visit: Payer: Self-pay

## 2020-09-29 DIAGNOSIS — E1122 Type 2 diabetes mellitus with diabetic chronic kidney disease: Secondary | ICD-10-CM | POA: Diagnosis not present

## 2020-09-29 DIAGNOSIS — I10 Essential (primary) hypertension: Secondary | ICD-10-CM | POA: Diagnosis not present

## 2020-09-29 DIAGNOSIS — I129 Hypertensive chronic kidney disease with stage 1 through stage 4 chronic kidney disease, or unspecified chronic kidney disease: Secondary | ICD-10-CM | POA: Diagnosis not present

## 2020-09-29 DIAGNOSIS — Z853 Personal history of malignant neoplasm of breast: Secondary | ICD-10-CM | POA: Diagnosis not present

## 2020-09-29 DIAGNOSIS — R609 Edema, unspecified: Secondary | ICD-10-CM | POA: Insufficient documentation

## 2020-09-29 DIAGNOSIS — G4733 Obstructive sleep apnea (adult) (pediatric): Secondary | ICD-10-CM | POA: Diagnosis not present

## 2020-09-29 DIAGNOSIS — M545 Low back pain, unspecified: Secondary | ICD-10-CM | POA: Diagnosis not present

## 2020-09-29 DIAGNOSIS — N3946 Mixed incontinence: Secondary | ICD-10-CM | POA: Diagnosis not present

## 2020-09-29 DIAGNOSIS — N3289 Other specified disorders of bladder: Secondary | ICD-10-CM | POA: Diagnosis not present

## 2020-09-29 MED ORDER — SODIUM CHLORIDE 0.9 % IV SOLN
200.0000 mg | Freq: Once | INTRAVENOUS | Status: DC
  Filled 2020-09-29: qty 10

## 2020-09-29 MED ORDER — SODIUM CHLORIDE 0.9 % IV SOLN: Freq: Once | INTRAVENOUS | Status: DC

## 2020-09-29 NOTE — Telephone Encounter (Signed)
NP

## 2020-09-29 NOTE — Telephone Encounter (Signed)
Thanks

## 2020-09-29 NOTE — Telephone Encounter (Signed)
The following patient was negative for DVT, according to recent study from [facility], Please Advise

## 2020-09-30 ENCOUNTER — Encounter (HOSPITAL_COMMUNITY)
Admission: RE | Admit: 2020-09-30 | Discharge: 2020-09-30 | Disposition: A | Discharge status: Home / Self Care | Payer: Medicare HMO | Source: Ambulatory Visit | Attending: Nephrology | Admitting: Nephrology

## 2020-09-30 ENCOUNTER — Encounter (HOSPITAL_COMMUNITY): Payer: Self-pay

## 2020-09-30 ENCOUNTER — Telehealth: Payer: Self-pay

## 2020-09-30 DIAGNOSIS — D509 Iron deficiency anemia, unspecified: Secondary | ICD-10-CM | POA: Diagnosis not present

## 2020-09-30 MED ORDER — SODIUM CHLORIDE 0.9 % IV SOLN
200.0000 mg | Freq: Once | INTRAVENOUS | Status: AC
  Administered 2020-09-30: 200 mg via INTRAVENOUS
  Filled 2020-09-30: qty 10

## 2020-09-30 MED ORDER — SODIUM CHLORIDE 0.9 % IV SOLN: Freq: Once | INTRAVENOUS | Status: AC

## 2020-09-30 NOTE — Telephone Encounter (Signed)
Called patient to give negative result. No answer and unable to leave a message.

## 2020-09-30 NOTE — Telephone Encounter (Signed)
-----   Message from Trula Slade, DPM sent at 09/29/2020  4:39 PM EDT ----- Negative for DVT  Stephanie Moreno- please let her know. Thanks.

## 2020-10-01 ENCOUNTER — Other Ambulatory Visit: Payer: Self-pay

## 2020-10-01 ENCOUNTER — Encounter (HOSPITAL_COMMUNITY): Payer: Self-pay

## 2020-10-01 ENCOUNTER — Encounter (HOSPITAL_COMMUNITY)
Admission: RE | Admit: 2020-10-01 | Discharge: 2020-10-01 | Disposition: A | Discharge status: Home / Self Care | Payer: Medicare HMO | Source: Ambulatory Visit | Attending: Nephrology | Admitting: Nephrology

## 2020-10-01 DIAGNOSIS — D509 Iron deficiency anemia, unspecified: Secondary | ICD-10-CM | POA: Diagnosis not present

## 2020-10-01 MED ORDER — SODIUM CHLORIDE 0.9 % IV SOLN
200.0000 mg | Freq: Once | INTRAVENOUS | Status: AC
  Administered 2020-10-01: 200 mg via INTRAVENOUS
  Filled 2020-10-01: qty 10

## 2020-10-01 MED ORDER — SODIUM CHLORIDE 0.9 % IV SOLN: Freq: Once | INTRAVENOUS | Status: AC

## 2020-10-04 ENCOUNTER — Encounter (HOSPITAL_COMMUNITY): Payer: Self-pay

## 2020-10-04 ENCOUNTER — Other Ambulatory Visit: Payer: Self-pay

## 2020-10-04 ENCOUNTER — Encounter (HOSPITAL_COMMUNITY)
Admission: RE | Admit: 2020-10-04 | Discharge: 2020-10-04 | Disposition: A | Discharge status: Home / Self Care | Payer: Medicare HMO | Source: Ambulatory Visit | Attending: Nephrology | Admitting: Nephrology

## 2020-10-04 DIAGNOSIS — D509 Iron deficiency anemia, unspecified: Secondary | ICD-10-CM | POA: Diagnosis not present

## 2020-10-04 MED ORDER — SODIUM CHLORIDE 0.9 % IV SOLN: Freq: Once | INTRAVENOUS | Status: AC

## 2020-10-04 MED ORDER — SODIUM CHLORIDE 0.9 % IV SOLN
200.0000 mg | Freq: Once | INTRAVENOUS | Status: AC
  Administered 2020-10-04: 200 mg via INTRAVENOUS
  Filled 2020-10-04 (×2): qty 10

## 2020-10-05 ENCOUNTER — Ambulatory Visit (INDEPENDENT_AMBULATORY_CARE_PROVIDER_SITE_OTHER): Payer: Medicare HMO | Admitting: Podiatry

## 2020-10-05 DIAGNOSIS — E1140 Type 2 diabetes mellitus with diabetic neuropathy, unspecified: Secondary | ICD-10-CM | POA: Diagnosis not present

## 2020-10-05 DIAGNOSIS — E1142 Type 2 diabetes mellitus with diabetic polyneuropathy: Secondary | ICD-10-CM

## 2020-10-05 DIAGNOSIS — M2142 Flat foot [pes planus] (acquired), left foot: Secondary | ICD-10-CM | POA: Diagnosis not present

## 2020-10-05 DIAGNOSIS — M79672 Pain in left foot: Secondary | ICD-10-CM

## 2020-10-05 DIAGNOSIS — M79671 Pain in right foot: Secondary | ICD-10-CM

## 2020-10-05 DIAGNOSIS — M2141 Flat foot [pes planus] (acquired), right foot: Secondary | ICD-10-CM | POA: Diagnosis not present

## 2020-10-05 NOTE — Progress Notes (Signed)
The patient presented to the office today to pick up diabetic shoes and 3 pair diabetic custom inserts.  1 pair of inserts were put in the shoes and the shoes were fitted to the patient. The patient states they are comfortable and free of defect. She was satisfied with the fit of the shoe. Instructions for break in and wear were dispensed. The patient signed the delivery documentation and break in instruction form.  If any concerns or questions arise, she is instructed to call.

## 2020-10-07 ENCOUNTER — Encounter (INDEPENDENT_AMBULATORY_CARE_PROVIDER_SITE_OTHER): Payer: Self-pay | Admitting: Nurse Practitioner

## 2020-10-07 ENCOUNTER — Other Ambulatory Visit: Payer: Self-pay

## 2020-10-07 ENCOUNTER — Ambulatory Visit (INDEPENDENT_AMBULATORY_CARE_PROVIDER_SITE_OTHER): Payer: Medicare HMO | Admitting: Nurse Practitioner

## 2020-10-07 VITALS — BP 124/72 | HR 83 | Temp 97.2°F | Ht 62.5 in | Wt 189.8 lb

## 2020-10-07 DIAGNOSIS — N1832 Chronic kidney disease, stage 3b: Secondary | ICD-10-CM | POA: Diagnosis not present

## 2020-10-07 DIAGNOSIS — E1122 Type 2 diabetes mellitus with diabetic chronic kidney disease: Secondary | ICD-10-CM | POA: Diagnosis not present

## 2020-10-07 DIAGNOSIS — M25552 Pain in left hip: Secondary | ICD-10-CM | POA: Diagnosis not present

## 2020-10-07 DIAGNOSIS — Z6834 Body mass index (BMI) 34.0-34.9, adult: Secondary | ICD-10-CM | POA: Diagnosis not present

## 2020-10-07 DIAGNOSIS — M549 Dorsalgia, unspecified: Secondary | ICD-10-CM

## 2020-10-07 DIAGNOSIS — E782 Mixed hyperlipidemia: Secondary | ICD-10-CM

## 2020-10-07 DIAGNOSIS — G8929 Other chronic pain: Secondary | ICD-10-CM | POA: Diagnosis not present

## 2020-10-07 DIAGNOSIS — E669 Obesity, unspecified: Secondary | ICD-10-CM | POA: Diagnosis not present

## 2020-10-07 DIAGNOSIS — M5442 Lumbago with sciatica, left side: Secondary | ICD-10-CM

## 2020-10-07 DIAGNOSIS — D509 Iron deficiency anemia, unspecified: Secondary | ICD-10-CM | POA: Diagnosis not present
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
## 2020-10-12 ENCOUNTER — Other Ambulatory Visit: Payer: Self-pay

## 2020-10-12 ENCOUNTER — Ambulatory Visit (HOSPITAL_COMMUNITY)
Admission: RE | Admit: 2020-10-12 | Discharge: 2020-10-12 | Disposition: A | Discharge status: Home / Self Care | Payer: Medicare HMO | Source: Ambulatory Visit | Attending: Nurse Practitioner | Admitting: Nurse Practitioner

## 2020-10-12 ENCOUNTER — Other Ambulatory Visit (INDEPENDENT_AMBULATORY_CARE_PROVIDER_SITE_OTHER): Payer: Medicare HMO

## 2020-10-12 DIAGNOSIS — M5442 Lumbago with sciatica, left side: Secondary | ICD-10-CM | POA: Diagnosis not present

## 2020-10-12 DIAGNOSIS — N1832 Chronic kidney disease, stage 3b: Secondary | ICD-10-CM | POA: Diagnosis not present

## 2020-10-12 DIAGNOSIS — E669 Obesity, unspecified: Secondary | ICD-10-CM | POA: Diagnosis not present

## 2020-10-12 DIAGNOSIS — Z6834 Body mass index (BMI) 34.0-34.9, adult: Secondary | ICD-10-CM | POA: Diagnosis not present

## 2020-10-12 DIAGNOSIS — M25552 Pain in left hip: Secondary | ICD-10-CM | POA: Diagnosis not present

## 2020-10-12 DIAGNOSIS — M549 Dorsalgia, unspecified: Secondary | ICD-10-CM | POA: Insufficient documentation

## 2020-10-12 DIAGNOSIS — E1122 Type 2 diabetes mellitus with diabetic chronic kidney disease: Secondary | ICD-10-CM | POA: Diagnosis not present

## 2020-10-12 DIAGNOSIS — M546 Pain in thoracic spine: Secondary | ICD-10-CM | POA: Diagnosis not present

## 2020-10-12 DIAGNOSIS — M1612 Unilateral primary osteoarthritis, left hip: Secondary | ICD-10-CM | POA: Diagnosis not present

## 2020-10-12 DIAGNOSIS — G8929 Other chronic pain: Secondary | ICD-10-CM | POA: Insufficient documentation

## 2020-10-12 DIAGNOSIS — E782 Mixed hyperlipidemia: Secondary | ICD-10-CM | POA: Diagnosis not present

## 2020-10-12 DIAGNOSIS — M47816 Spondylosis without myelopathy or radiculopathy, lumbar region: Secondary | ICD-10-CM | POA: Diagnosis not present

## 2020-10-12 DIAGNOSIS — M545 Low back pain, unspecified: Secondary | ICD-10-CM | POA: Diagnosis not present

## 2020-10-12 DIAGNOSIS — D509 Iron deficiency anemia, unspecified: Secondary | ICD-10-CM | POA: Diagnosis not present

## 2020-10-13 ENCOUNTER — Other Ambulatory Visit (INDEPENDENT_AMBULATORY_CARE_PROVIDER_SITE_OTHER): Payer: Self-pay | Admitting: Nurse Practitioner

## 2020-10-13 DIAGNOSIS — G8929 Other chronic pain: Secondary | ICD-10-CM

## 2020-10-13 LAB — CBC WITH DIFFERENTIAL/PLATELET
Absolute Monocytes: 436 cells/uL (ref 200–950)
Basophils Absolute: 72 cells/uL (ref 0–200)
Basophils Relative: 1.1 %
Eosinophils Absolute: 280 cells/uL (ref 15–500)
Eosinophils Relative: 4.3 %
HCT: 37.9 % (ref 35.0–45.0)
Hemoglobin: 11.6 g/dL — ABNORMAL LOW (ref 11.7–15.5)
Lymphs Abs: 3374 cells/uL (ref 850–3900)
MCH: 25 pg — ABNORMAL LOW (ref 27.0–33.0)
MCHC: 30.6 g/dL — ABNORMAL LOW (ref 32.0–36.0)
MCV: 81.7 fL (ref 80.0–100.0)
MPV: 10.7 fL (ref 7.5–12.5)
Monocytes Relative: 6.7 %
Neutro Abs: 2340 cells/uL (ref 1500–7800)
Neutrophils Relative %: 36 %
Platelets: 271 10*3/uL (ref 140–400)
RBC: 4.64 10*6/uL (ref 3.80–5.10)
RDW: 15.8 % — ABNORMAL HIGH (ref 11.0–15.0)
Total Lymphocyte: 51.9 %
WBC: 6.5 10*3/uL (ref 3.8–10.8)

## 2020-10-13 LAB — HEMOGLOBIN A1C
Hgb A1c MFr Bld: 7.4 % of total Hgb — ABNORMAL HIGH (ref <5.7)
Mean Plasma Glucose: 166 mg/dL
eAG (mmol/L): 9.2 mmol/L

## 2020-10-13 LAB — COMPLETE METABOLIC PANEL WITH GFR
AG Ratio: 1 (calc) (ref 1.0–2.5)
ALT: 25 U/L (ref 6–29)
AST: 30 U/L (ref 10–35)
Albumin: 4.2 g/dL (ref 3.6–5.1)
Alkaline phosphatase (APISO): 93 U/L (ref 37–153)
BUN/Creatinine Ratio: 15 (calc) (ref 6–22)
BUN: 21 mg/dL (ref 7–25)
CO2: 20 mmol/L (ref 20–32)
Calcium: 9.6 mg/dL (ref 8.6–10.4)
Chloride: 105 mmol/L (ref 98–110)
Creat: 1.42 mg/dL — ABNORMAL HIGH (ref 0.60–0.93)
GFR, Est African American: 42 mL/min/{1.73_m2} — ABNORMAL LOW (ref >=60)
GFR, Est Non African American: 36 mL/min/{1.73_m2} — ABNORMAL LOW (ref >=60)
Globulin: 4.2 g/dL (calc) — ABNORMAL HIGH (ref 1.9–3.7)
Glucose, Bld: 123 mg/dL — ABNORMAL HIGH (ref 65–99)
Potassium: 4.2 mmol/L (ref 3.5–5.3)
Sodium: 137 mmol/L (ref 135–146)
Total Bilirubin: 0.4 mg/dL (ref 0.2–1.2)
Total Protein: 8.4 g/dL — ABNORMAL HIGH (ref 6.1–8.1)

## 2020-10-13 LAB — LIPID PANEL
Cholesterol: 122 mg/dL (ref <200)
HDL: 60 mg/dL (ref >=50)
LDL Cholesterol (Calc): 46 mg/dL (calc)
Non-HDL Cholesterol (Calc): 62 mg/dL (calc) (ref <130)
Total CHOL/HDL Ratio: 2 (calc) (ref <5.0)
Triglycerides: 79 mg/dL (ref <150)

## 2020-10-13 LAB — TSH: TSH: 3.94 mIU/L (ref 0.40–4.50)

## 2020-10-20 DIAGNOSIS — G4730 Sleep apnea, unspecified: Secondary | ICD-10-CM | POA: Diagnosis not present

## 2020-10-20 DIAGNOSIS — E1122 Type 2 diabetes mellitus with diabetic chronic kidney disease: Secondary | ICD-10-CM | POA: Diagnosis not present

## 2020-10-20 DIAGNOSIS — R809 Proteinuria, unspecified: Secondary | ICD-10-CM | POA: Diagnosis not present

## 2020-10-20 DIAGNOSIS — K30 Functional dyspepsia: Secondary | ICD-10-CM | POA: Diagnosis not present

## 2020-10-20 DIAGNOSIS — E539 Vitamin B deficiency, unspecified: Secondary | ICD-10-CM | POA: Diagnosis not present

## 2020-10-20 DIAGNOSIS — C50219 Malignant neoplasm of upper-inner quadrant of unspecified female breast: Secondary | ICD-10-CM | POA: Diagnosis not present

## 2020-10-20 DIAGNOSIS — R350 Frequency of micturition: Secondary | ICD-10-CM | POA: Diagnosis not present

## 2020-10-20 DIAGNOSIS — R945 Abnormal results of liver function studies: Secondary | ICD-10-CM | POA: Diagnosis not present

## 2020-10-20 DIAGNOSIS — K5900 Constipation, unspecified: Secondary | ICD-10-CM | POA: Diagnosis not present

## 2020-10-20 DIAGNOSIS — Z9013 Acquired absence of bilateral breasts and nipples: Secondary | ICD-10-CM | POA: Diagnosis not present

## 2020-10-21 ENCOUNTER — Encounter: Payer: Self-pay | Admitting: Surgery

## 2020-10-21 ENCOUNTER — Other Ambulatory Visit: Payer: Self-pay

## 2020-10-21 ENCOUNTER — Ambulatory Visit (INDEPENDENT_AMBULATORY_CARE_PROVIDER_SITE_OTHER): Payer: Medicare HMO | Admitting: Surgery

## 2020-10-21 VITALS — BP 167/85 | HR 75 | Ht 62.5 in | Wt 198.0 lb

## 2020-10-21 DIAGNOSIS — G9519 Other vascular myelopathies: Secondary | ICD-10-CM

## 2020-10-21 DIAGNOSIS — M5416 Radiculopathy, lumbar region: Secondary | ICD-10-CM

## 2020-10-21 DIAGNOSIS — M5136 Other intervertebral disc degeneration, lumbar region: Secondary | ICD-10-CM | POA: Diagnosis not present

## 2020-10-21 NOTE — Progress Notes (Signed)
Office Visit Note   Patient: Stephanie Moreno           Date of Birth: 29-Jul-1945           MRN: [id number] Visit Date: 10/21/2020              Requested by: Ailene Ards, NP [address] [place],  [place] [address] PCP: Doree Albee, MD   Assessment & Plan: Visit Diagnoses:  1. Radiculopathy, lumbar region   2. Neurogenic claudication ([place])   3. DDD (degenerative disc disease), lumbar     Plan: With patient's worsening low back pain, left lower extremity radiculopathy with neurogenic claudication symptoms and leg weakness I recommend getting lumbar spine MRI to rule out HNP/stenosis.  I did order this as a stat and we will see if we can get this done within the next couple of days.  Patient did have lower extremity weakness on my exam today.  She has failed conservative treatment with home health PT, use of a walker, activity modification.  With patient's history of chronic kidney disease and diabetes she is limited as far as what she can take for anti-inflammatory medication.  I will have her follow-up with Dr. Louanne Skye in 2 weeks to review lumbar MRI and discuss treatment options.  I did review the x-rays that were recently done.  Follow-Up Instructions: Return in about 2 weeks (around 11/04/2020) for with dr Louanne Skye to review lumbar MRI.  worsening pain and leg weakness. .   Orders:  Orders Placed This Encounter  Procedures  . MR Lumbar Spine w/o contrast   No orders of the defined types were placed in this encounter.     Procedures: No procedures performed   Clinical Data: No additional findings.   Subjective: No chief complaint on file.   HPI 75 year old black female who is a new patient to clinic comes in for evaluation of back pain and leg pain.  Patient dates that she has had back pain for several years but symptoms worse over the last several months.  Patient moved to [place] in 2019 from [place].  While in [place] she had been seeing a physician for  chronic low back issues.  Patient states that she did have an MRI and eventually went on to have at least 3 lumbar ESI's.  The last one was performed early 2019.  Upon getting established here in [place] with primary care physician Dr. Nevada Crane and sounds like he has been managing lumbar issues conservatively.  She states that she started home health PT December 2021 and they came out for a few months.  States that the therapist told her that she needed to follow-up with an orthopedic specialist due to the ongoing leg weakness that she has been having and not getting better.  Patient got established with a new primary care provider a couple weeks ago and eventually was referred to our clinic here new provider and ordered X-rays thoracic spine, lumbar spine and left hip April 13,2022 and those records are in the chart.  Currently she states that she is having worsening low back pain that radiates into the left buttock in hip.  Pain also radiates down to her left calf.  She states that symptoms are worse when she ambulates where she gets cramping in both of her legs and legs get weak.  She has been using a rolling walker x2 years.  While in a grocery store patient does have to hold onto a cart leaning  forward to help try to relieve some of her symptoms.  Patient is unable to take oral NSAIDs due to history of chronic kidney disease.  Patient also has history of diabetes.  States that she was diagnosed with neuropathy both feet by her podiatrist.  Patient also sees a neurologist.  She has not had an MRI of her lumbar spine since she was in [place].  Review of Systems No current cardiac pulmonary GI GU  Objective: Vital Signs: BP (!) 167/85   Pulse 75   Ht 5' 2.5" (1.588 m)   Wt 198 lb (89.8 kg)   BMI 35.64 kg/m   Physical Exam HENT:     Head: Normocephalic.  Eyes:     Extraocular Movements: Extraocular movements intact.  Pulmonary:     Effort: No respiratory distress.  Musculoskeletal:      Comments: Gait is antalgic with a rolling walker.  Positive left-sided lumbar paraspinal tenderness.  Tenderness over the left SI joint.  Mild to moderate tenderness left hip greater trochanter bursa.  Negative logroll bilateral hips.  Positive left straight leg raise.  Trace right quad weakness.  Trace bilateral anterior tib and gastroc weakness.  Neurological:     Mental Status: She is alert and oriented to person, place, and time.  Psychiatric:        Mood and Affect: Mood normal.     Ortho Exam  Specialty Comments:  No specialty comments available.  Imaging: No results found.   PMFS History: Patient Active Problem List   Diagnosis Date Noted  . Type 2 diabetes mellitus with stage 3b chronic kidney disease, without long-term current use of insulin ([place]) 07/12/2020  . Mixed hyperlipidemia 07/12/2020  . Essential hypertension, benign 07/12/2020  . Iron deficiency anemia   . Loss of weight 04/09/2020  . Dyspepsia 01/28/2020  . Constipation 01/28/2020  . Lower abdominal pain 01/28/2020  . Normocytic anemia 01/28/2020   Past Medical History:  Diagnosis Date  . Acid reflux disease   . Anemia   . Breast cancer ([place])    left side  . Chronic kidney disease   . Diabetes mellitus without complication ([place])   . Diverticulitis   . Hypertension   . Sleep apnea   . Thyroid disease     Family History  Problem Relation Age of Onset  . Diabetes Mother   . Thyroid disease Mother   . Colon cancer Neg Hx     Past Surgical History:  Procedure Laterality Date  . BIOPSY  02/19/2020   Procedure: BIOPSY;  Surgeon: Eloise Harman, DO;  Location: [facility];  Service: Endoscopy;;  . BREAST LUMPECTOMY Left 2003  . BREAST SURGERY    . COLONOSCOPY WITH PROPOFOL N/A 02/19/2020   non-bleeding internal hemorrhoids, many small-mouthed diverticula in entire colon.  . ESOPHAGOGASTRODUODENOSCOPY (EGD) WITH PROPOFOL N/A 02/19/2020    benign-appearing esophageal stenosis s/p dilation,  gastritis s/p biopsy, normal duodenum. Negative H.pylori, negative duodenal biopsy.   Marland Kitchen GIVENS CAPSULE STUDY N/A 06/14/2020   Procedure: GIVENS CAPSULE STUDY;  Surgeon: Eloise Harman, DO;  Location: [facility];  Service: Endoscopy;  Laterality: N/A;  7:30am  . THYROIDECTOMY    . tummy tuck     at time of breast surgery 2003   Social History   Occupational History  . 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 Smoking status: Never Smoker  . Smokeless tobacco: Never Used  Vaping Use  .  Vaping Use: Never used  Substance and Sexual Activity  . Alcohol use: Not Currently  . Drug use: Not Currently  . Sexual activity: Not on file

## 2020-10-22 ENCOUNTER — Ambulatory Visit (HOSPITAL_COMMUNITY)
Admission: RE | Admit: 2020-10-22 | Discharge: 2020-10-22 | Disposition: A | Discharge status: Home / Self Care | Payer: Medicare HMO | Source: Ambulatory Visit | Attending: Surgery | Admitting: Surgery

## 2020-10-22 DIAGNOSIS — M5416 Radiculopathy, lumbar region: Secondary | ICD-10-CM | POA: Diagnosis not present

## 2020-10-22 DIAGNOSIS — G9519 Other vascular myelopathies: Secondary | ICD-10-CM | POA: Insufficient documentation

## 2020-10-22 DIAGNOSIS — M545 Low back pain, unspecified: Secondary | ICD-10-CM | POA: Diagnosis not present

## 2020-10-22 DIAGNOSIS — M5136 Other intervertebral disc degeneration, lumbar region: Secondary | ICD-10-CM | POA: Insufficient documentation

## 2020-10-26 ENCOUNTER — Other Ambulatory Visit: Payer: Self-pay | Admitting: Podiatry

## 2020-10-26 ENCOUNTER — Other Ambulatory Visit: Payer: Self-pay

## 2020-10-26 ENCOUNTER — Ambulatory Visit (INDEPENDENT_AMBULATORY_CARE_PROVIDER_SITE_OTHER): Payer: Medicare HMO | Admitting: Nurse Practitioner

## 2020-10-26 ENCOUNTER — Encounter: Payer: Self-pay | Admitting: Nurse Practitioner

## 2020-10-26 VITALS — BP 121/76 | HR 75 | Ht 62.5 in | Wt 193.8 lb

## 2020-10-26 DIAGNOSIS — N1832 Chronic kidney disease, stage 3b: Secondary | ICD-10-CM | POA: Diagnosis not present

## 2020-10-26 DIAGNOSIS — I10 Essential (primary) hypertension: Secondary | ICD-10-CM

## 2020-10-26 DIAGNOSIS — E782 Mixed hyperlipidemia: Secondary | ICD-10-CM

## 2020-10-26 DIAGNOSIS — E1122 Type 2 diabetes mellitus with diabetic chronic kidney disease: Secondary | ICD-10-CM

## 2020-10-26 LAB — POCT UA - MICROALBUMIN: Microalbumin Ur, POC: 80 mg/L

## 2020-10-26 MED ORDER — TRESIBA FLEXTOUCH 100 UNIT/ML ~~LOC~~ SOPN: 35.0000 [IU] | PEN_INJECTOR | Freq: Every day | SUBCUTANEOUS | 3 refills | Status: DC

## 2020-10-26 MED ORDER — GLIPIZIDE ER 5 MG PO TB24: 5.0000 mg | ORAL_TABLET | Freq: Every day | ORAL | 3 refills | Status: DC

## 2020-10-26 MED ORDER — BD PEN NEEDLE SHORT U/F 31G X 8 MM MISC: 1.0000 | 3 refills | Status: DC

## 2020-10-26 NOTE — Patient Instructions (Signed)

## 2020-10-26 NOTE — Telephone Encounter (Signed)
Please advise 

## 2020-10-26 NOTE — Progress Notes (Signed)
10/26/2020, 11:22 AM  Endocrinology follow-up note   Subjective:    Patient ID: Stephanie Moreno, female    DOB: 1945-07-28.  Stephanie Moreno is being seen in follow-up after she was seen in consultation for management of currently uncontrolled symptomatic diabetes requested by  Ailene Ards, NP.   Past Medical History:  Diagnosis Date  . Acid reflux disease   . Anemia   . Breast cancer ([place])    left side  . Chronic kidney disease   . Diabetes mellitus without complication ([place])   . Diverticulitis   . Hypertension   . Sleep apnea   . Thyroid disease     Past Surgical History:  Procedure Laterality Date  . BIOPSY  02/19/2020   Procedure: BIOPSY;  Surgeon: Eloise Harman, DO;  Location: [facility];  Service: Endoscopy;;  . BREAST LUMPECTOMY Left 2003  . BREAST SURGERY    . COLONOSCOPY WITH PROPOFOL N/A 02/19/2020   non-bleeding internal hemorrhoids, many small-mouthed diverticula in entire colon.  . ESOPHAGOGASTRODUODENOSCOPY (EGD) WITH PROPOFOL N/A 02/19/2020    benign-appearing esophageal stenosis s/p dilation, gastritis s/p biopsy, normal duodenum. Negative H.pylori, negative duodenal biopsy.   Marland Kitchen GIVENS CAPSULE STUDY N/A 06/14/2020   Procedure: GIVENS CAPSULE STUDY;  Surgeon: Eloise Harman, DO;  Location: [facility];  Service: Endoscopy;  Laterality: N/A;  7:30am  . THYROIDECTOMY    . tummy tuck     at time of breast surgery 2003    Social History   Socioeconomic History  . Marital status: Divorced    Spouse name: Not on file  . Number of children: Not on file  . Years of education: Not on file  . Highest education level: Not on file  Occupational History  . 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 Smoking status: Never Smoker  . Smokeless tobacco: Never Used  Vaping Use  . Vaping Use: Never used  Substance and Sexual Activity  . Alcohol use: Not Currently  . Drug use: Not Currently   . Sexual activity: Not on file  Other Topics Concern  . Not on file  Social History Narrative  . Not on file   Social Determinants of Health   Financial Resource Strain: Not on file  Food Insecurity: Not on file  Transportation Needs: Not on file  Physical Activity: Not on file  Stress: Not on file  Social Connections: Not on file    Family History  Problem Relation Age of Onset  . Diabetes Mother   . Thyroid disease Mother   . Colon cancer Neg Hx     Outpatient Encounter Medications as of 10/26/2020  Medication Sig  . acetaminophen (TYLENOL) 500 MG tablet Take 1,000 mg by mouth as needed for moderate pain.   Marland Kitchen amitriptyline (ELAVIL) 50 MG tablet Take 50 mg by mouth at bedtime.  Marland Kitchen amLODipine (NORVASC) 10 MG tablet Take 10 mg by mouth daily.  Marland Kitchen aspirin EC 81 MG tablet Take 81 mg by mouth every morning. Swallow whole.  . cyclobenzaprine (FLEXERIL) 10 MG tablet Take 10 mg by mouth 3 (three) times daily as needed.  . Dulaglutide (TRULICITY) 1.5 PI/9.5JO SOPN Inject 1.5 mg into the skin every Monday.  . furosemide (LASIX) 40 MG tablet Take 40 mg by mouth daily.  Marland Kitchen gabapentin (NEURONTIN) 300 MG capsule Take 1 capsule by mouth at bedtime.  Marland Kitchen GEMTESA  75 MG TABS Take 1 tablet by mouth daily.  Marland Kitchen GLUCOSAMINE-CHONDROITIN PO Take 1 tablet by mouth daily.  Marland Kitchen glucose blood (ACCU-CHEK GUIDE) test strip Use as instructed  . Insulin Pen Needle (B-D ULTRAFINE III SHORT PEN) 31G X 8 MM MISC 1 each by Does not apply route as directed.  . Lancets (ONETOUCH DELICA PLUS [id number]) MISC Apply topically 3 (three) times daily.  Marland Kitchen lidocaine (LIDODERM) 5 % Place 1 patch onto the skin daily as needed (pain). Remove & Discard patch within 12 hours or as directed by MD. Applied to lower back/shoulders/neck for pain  . lidocaine (XYLOCAINE) 2 % solution   . linaclotide (LINZESS) 72 MCG capsule Take 1 capsule (72 mcg total) by mouth daily before breakfast.  . losartan (COZAAR) 50 MG tablet Take 100 mg by  mouth daily.  . magnesium oxide (MAG-OX) 400 MG tablet Take 400 mg by mouth daily.  . meclizine (ANTIVERT) 25 MG tablet Take 25 mg by mouth 3 (three) times daily as needed for dizziness.  . metoprolol tartrate (LOPRESSOR) 50 MG tablet Take 50 mg by mouth 2 (two) times daily.  . Naphazoline HCl (CLEAR EYES OP) Place 1 drop into both eyes daily as needed (dry eyes).  . OYSTER SHELL CALCIUM PLUS D 500-200 MG-UNIT TABS Take 1 tablet by mouth 3 (three) times daily.  . pantoprazole (PROTONIX) 40 MG tablet Take 1 tablet (40 mg total) by mouth daily. 30 minutes before breakfast  . phenazopyridine (PYRIDIUM) 200 MG tablet Take 1 tablet (200 mg total) by mouth 3 (three) times daily.  . polyethylene glycol (MIRALAX / GLYCOLAX) 17 g packet Take 17 g by mouth daily as needed for moderate constipation.  . rosuvastatin (CRESTOR) 10 MG tablet Take 10 mg by mouth at bedtime.   . sucralfate (CARAFATE) 1 GM/10ML suspension Take 1.5 g by mouth 3 (three) times daily as needed (indigestion).   . verapamil (CALAN-SR) 180 MG CR tablet Take 180 mg by mouth daily.  . [DISCONTINUED] glipiZIDE (GLUCOTROL XL) 5 MG 24 hr tablet TAKE TWO TABLETS BY MOUTH EVERY MORNING  . [DISCONTINUED] insulin degludec (TRESIBA FLEXTOUCH) 100 UNIT/ML FlexTouch Pen Inject 30 Units into the skin daily.  . beclomethasone (QVAR) 80 MCG/ACT inhaler Inhale 1 puff into the lungs 2 (two) times daily as needed (shortness of breath). (Patient not taking: Reported on 10/26/2020)  . Blood Glucose Monitoring Suppl (ACCU-CHEK GUIDE ME) w/Device KIT 1 Piece by Does not apply route as directed. (Patient not taking: Reported on 10/26/2020)  . glipiZIDE (GLUCOTROL XL) 5 MG 24 hr tablet Take 1 tablet (5 mg total) by mouth daily with breakfast.  . insulin degludec (TRESIBA FLEXTOUCH) 100 UNIT/ML FlexTouch Pen Inject 35 Units into the skin daily.  . [DISCONTINUED] amitriptyline (ELAVIL) 25 MG tablet Take 25 mg by mouth at bedtime.  . [DISCONTINUED] gabapentin  (NEURONTIN) 100 MG capsule Take 2 capsules (200 mg total) by mouth at bedtime.  . [DISCONTINUED] gabapentin (NEURONTIN) 100 MG capsule TAKE TWO CAPSULES BY MOUTH EVERYDAY AT BEDTIME  . [DISCONTINUED] insulin degludec (TRESIBA FLEXTOUCH) 100 UNIT/ML FlexTouch Pen Inject 35 Units into the skin daily.  . [DISCONTINUED] MYRBETRIQ 50 MG TB24 tablet Take 50 mg by mouth at bedtime.  . [DISCONTINUED] verapamil (VERELAN PM) 180 MG 24 hr capsule Take 180 mg by mouth daily.   No facility-administered encounter medications on file as of 10/26/2020.    ALLERGIES: Allergies  Allergen Reactions  . Codeine Nausea And Vomiting  . Lisinopril Nausea And Vomiting  .  Penicillins Hives    VACCINATION STATUS:  There is no immunization history on file for this patient.  Diabetes She presents for her follow-up diabetic visit. She has type 2 diabetes mellitus. Onset time: Diagnosed at approx age of 66. Her disease course has been fluctuating. Hypoglycemia symptoms include nervousness/anxiousness, sweats and tremors. Associated symptoms include fatigue, foot paresthesias, polydipsia and polyuria. There are no hypoglycemic complications. Symptoms are improving. Diabetic complications include a CVA, nephropathy and peripheral neuropathy. Risk factors for coronary artery disease include diabetes mellitus, dyslipidemia, hypertension, post-menopausal and sedentary lifestyle. Current diabetic treatment includes insulin injections and oral agent (dual therapy). She is compliant with treatment most of the time. Her weight is fluctuating minimally. She is following a generally unhealthy diet. When asked about meal planning, she reported none. She has not had a previous visit with a dietitian. She rarely participates in exercise. Her home blood glucose trend is fluctuating dramatically. Her breakfast blood glucose range is generally 140-180 mg/dl. Her bedtime blood glucose range is generally >200 mg/dl. (She presents today,  accompanied by a family member, with her glucose meter and logs showing widely fluctuating glycemic pattern overall.  She called in between visits for low glucose readings but now her levels have gone back up.  Her previsit A1c on 4/12 was 7.4%, improving from previous visit of 8.6%.  She is undergoing work up for back pain which may result in surgery in the near future.) An ACE inhibitor/angiotensin II receptor blocker is being taken. She does not see a podiatrist.Eye exam is current.  Hypertension This is a chronic problem. The current episode started more than 1 year ago. The problem is unchanged. The problem is controlled. Associated symptoms include sweats. Agents associated with hypertension include thyroid hormones. Risk factors for coronary artery disease include diabetes mellitus, dyslipidemia, post-menopausal state and sedentary lifestyle. Past treatments include calcium channel blockers, diuretics, angiotensin blockers and beta blockers. There are no compliance problems.  Hypertensive end-organ damage includes kidney disease and CVA. Identifiable causes of hypertension include chronic renal disease, sleep apnea and a thyroid problem.  Hyperlipidemia This is a chronic problem. The current episode started more than 1 year ago. The problem is uncontrolled. Recent lipid tests were reviewed and are high. Exacerbating diseases include chronic renal disease and hypothyroidism. Factors aggravating her hyperlipidemia include beta blockers. Current antihyperlipidemic treatment includes statins. The current treatment provides moderate improvement of lipids. There are no compliance problems.  Risk factors for coronary artery disease include diabetes mellitus, dyslipidemia, hypertension, obesity, post-menopausal and a sedentary lifestyle.     Review of systems  Constitutional: + steadily increasing body weight,  current Body mass index is 34.88 kg/m. , + fatigue, no subjective hyperthermia, no subjective  hypothermia Eyes: no blurry vision, no xerophthalmia ENT: no sore throat, no nodules palpated in throat, no dysphagia/odynophagia, no hoarseness Cardiovascular: no chest pain, no shortness of breath, no palpitations, + leg swelling Respiratory: no cough, no shortness of breath Gastrointestinal: no nausea/vomiting/diarrhea Musculoskeletal: no muscle/joint aches, walks with walker due to previous CVA and disequilibrium Skin: no rashes, no hyperemia Neurological: no tremors, no dizziness, + numbness/tingling to BLE (on gabapentin) Psychiatric: no depression, no anxiety  Objective:     BP 121/76 (BP Location: Right Arm, Patient Position: Sitting)   Pulse 75   Ht 5' 2.5" (1.588 m)   Wt 193 lb 12.8 oz (87.9 kg)   BMI 34.88 kg/m   Wt Readings from Last 3 Encounters:  10/26/20 193 lb 12.8 oz (87.9 kg)  10/21/20  198 lb (89.8 kg)  10/07/20 189 lb 12.8 oz (86.1 kg)    BP Readings from Last 3 Encounters:  10/26/20 121/76  10/21/20 (!) 167/85  10/07/20 124/72    Physical Exam- Limited  Constitutional:  There is no height or weight on file to calculate BMI. , not in acute distress, normal state of mind Eyes:  EOMI, no exophthalmos Neck: Supple Cardiovascular: RRR, no murmers, rubs, or gallops, 1+ pitting edema to BLE Respiratory: Adequate breathing efforts, no crackles, rales, rhonchi, or wheezing Musculoskeletal: no gross deformities, strength intact in all four extremities, no gross restriction of joint movements, walks with walker due to previous CVA and disequilibrium Skin:  no rashes, no hyperemia Neurological: no tremor with outstretched hands     CMP ( most recent) CMP     Component Value Date/Time   NA 137 10/12/2020 1009   K 4.2 10/12/2020 1009   CL 105 10/12/2020 1009   CO2 20 10/12/2020 1009   GLUCOSE 123 (H) 10/12/2020 1009   BUN 21 10/12/2020 1009   BUN 9 03/09/2020 0000   CREATININE 1.42 (H) 10/12/2020 1009   CALCIUM 9.6 10/12/2020 1009   PROT 8.4 (H)  10/12/2020 1009   ALBUMIN 3.6 04/12/2020 1423   AST 30 10/12/2020 1009   ALT 25 10/12/2020 1009   ALKPHOS 76 04/12/2020 1423   BILITOT 0.4 10/12/2020 1009   GFRNONAA 36 (L) 10/12/2020 1009   GFRAA 42 (L) 10/12/2020 1009     Diabetic Labs (most recent): Lab Results  Component Value Date   HGBA1C 7.4 (H) 10/12/2020   HGBA1C 8.6 (A) 07/12/2020   HGBA1C 8.8 03/09/2020     Lipid Panel ( most recent) Lipid Panel     Component Value Date/Time   CHOL 122 10/12/2020 1009   TRIG 79 10/12/2020 1009   HDL 60 10/12/2020 1009   CHOLHDL 2.0 10/12/2020 1009   LDLCALC 46 10/12/2020 1009      Lab Results  Component Value Date   TSH 3.94 10/12/2020   TSH 1.18 03/09/2020      Assessment & Plan:   1) Type 2 diabetes mellitus with stage 3b chronic kidney disease, without long-term current use of insulin ([place])  - Stephanie Moreno has currently uncontrolled symptomatic type 2 DM since  75 years of age.  She presents today, accompanied by a family member, with her glucose meter and logs showing widely fluctuating glycemic pattern overall.  She called in between visits for low glucose readings but now her levels have gone back up.  Her previsit A1c on 4/12 was 7.4%, improving from previous visit of 8.6%.  She is undergoing work up for back pain which may result in surgery in the near future.   Recent labs reviewed.    - I had a long discussion with her about the progressive nature of diabetes and the pathology behind its complications. -her diabetes is complicated by CKD, CVA and she remains at a high risk for more acute and chronic complications which include CAD, CVA, CKD, retinopathy, and neuropathy. These are all discussed in detail with her.  - Nutritional counseling repeated at each appointment due to patients tendency to fall back in to old habits.  - The patient admits there is a room for improvement in their diet and drink choices. -  Suggestion is made for the patient to avoid  simple carbohydrates from their diet including Cakes, Sweet Desserts / Pastries, Ice Cream, Soda (diet and regular), Sweet Tea, Candies, Chips, Cookies,  Sweet Pastries,  Store Bought Juices, Alcohol in Excess of  1-2 drinks a day, Artificial Sweeteners, Coffee Creamer, and "Sugar-free" Products. This will help patient to have stable blood glucose profile and potentially avoid unintended weight gain.   - I encouraged the patient to switch to  unprocessed or minimally processed complex starch and increased protein intake (animal or plant source), fruits, and vegetables.   - Patient is advised to stick to a routine mealtimes to eat 3 meals  a day and avoid unnecessary snacks ( to snack only to correct hypoglycemia).  - she will be scheduled with Jearld Fenton, RDN, CDE for diabetes education.  - I have approached her with the following individualized plan to manage  her diabetes and patient agrees:   - she will need at least basal insulin in order for her to achieve control of diabetes to target, and she accepts this treatment option at this time.    -Given her persistent fasting hyperglycemia, she will tolerate increase in her Tresiba to 34 units SQ daily at bedtime.  She can continue Trulicity 1.5 mg SQ weekly and Glipizide 5 mg XL daily with breakfast.  Her Metformin was stopped due to worsening kidney failure.  -She is encouraged to continue monitoring blood glucose twice daily, before breakfast and before bed, and to call the clinic if she has readings less than 70 or greater than 200 for 3 tests in a row,  - Specific targets for  A1c;  LDL, HDL,  and Triglycerides were discussed with the patient.  2) Blood Pressure /Hypertension:   Her blood pressure is controlled to target for her age.   she is advised to continue her current medications including Losartan 100 mg p.o. daily with breakfast, Norvasc 10 mg po daily, Lasix 40 mg po daily, and Metoprolol 50 mg po twice daily .  3)  Lipids/Hyperlipidemia:    Review of her recent lipid panel from 10/12/20 showed controlled LDL at 46.  she  is advised to continue Crestor 10 mg p.o. daily at bedtime.   Side effects and precautions discussed with her.  4)  Weight/Diet:  Her Body mass index is 34.88 kg/m.  -   clearly complicating her diabetes care.   she is  a candidate for weight loss. I discussed with her the fact that loss of 5 - 10% of her  current body weight will have the most impact on her diabetes management.  Exercise, and detailed carbohydrates information provided  -  detailed on discharge instructions.  5) Chronic Care/Health Maintenance: -she  is on ACEI/ARB and Statin medications and  is encouraged to initiate and continue to follow up with Ophthalmology, Dentist,  Podiatrist at least yearly or according to recommendations, and advised to  stay away from smoking. I have recommended yearly flu vaccine and pneumonia vaccine at least every 5 years; she cannot exercise optimally due to her disequilibrium.  She is advised to sleep for at least 7 hours a day.  - she is  advised to maintain close follow up with Ailene Ards, NP for primary care needs, as well as her other providers for optimal and coordinated care.    I spent 45 minutes in the care of the patient today including review of labs from [facility], Lipids, Thyroid Function, Hematology (current and previous including abstractions from other facilities); face-to-face time discussing  her blood glucose readings/logs, discussing hypoglycemia and hyperglycemia episodes and symptoms, medications doses, her options of short and long term treatment based  on the latest standards of care / guidelines;  discussion about incorporating lifestyle medicine;  and documenting the encounter.    Please refer to Patient Instructions for Blood Glucose Monitoring and Insulin/Medications Dosing Guide"  in media tab for additional information. Please  also refer to " Patient Self Inventory" in  the Media  tab for reviewed elements of pertinent patient history.  Stephanie Moreno participated in the discussions, expressed understanding, and voiced agreement with the above plans.  All questions were answered to her satisfaction. she is encouraged to contact clinic should she have any questions or concerns prior to her return visit.   Follow up plan: - Return in about 3 months (around 01/25/2021) for Diabetes follow up with A1c in office, Previsit labs, Bring glucometer and logs.  Rayetta Pigg, [facility] [address] [place], [place] [address] Phone: [phone] Fax: [phone]  10/26/2020, 11:22 AM

## 2020-10-27 ENCOUNTER — Ambulatory Visit (INDEPENDENT_AMBULATORY_CARE_PROVIDER_SITE_OTHER): Payer: Medicare HMO | Admitting: Nurse Practitioner

## 2020-10-27 ENCOUNTER — Encounter (INDEPENDENT_AMBULATORY_CARE_PROVIDER_SITE_OTHER): Payer: Self-pay | Admitting: Nurse Practitioner

## 2020-10-27 VITALS — BP 126/78 | HR 84 | Temp 97.1°F | Ht 62.5 in

## 2020-10-27 DIAGNOSIS — E1122 Type 2 diabetes mellitus with diabetic chronic kidney disease: Secondary | ICD-10-CM | POA: Diagnosis not present

## 2020-10-27 DIAGNOSIS — N1832 Chronic kidney disease, stage 3b: Secondary | ICD-10-CM

## 2020-10-27 DIAGNOSIS — M4807 Spinal stenosis, lumbosacral region: Secondary | ICD-10-CM

## 2020-10-27 DIAGNOSIS — D509 Iron deficiency anemia, unspecified: Secondary | ICD-10-CM | POA: Diagnosis not present

## 2020-10-27 NOTE — Patient Instructions (Signed)
Stephanie Moreno at [facility] - [phone]

## 2020-10-27 NOTE — Progress Notes (Signed)
Subjective:  Patient ID: Stephanie Moreno, female    DOB: 01-May-1946  Age: 75 y.o. MRN: [id number]  CC:  Chief Complaint  Patient presents with  . Follow-up    Patient is having trouble with her hips today, they are giving out and left is worse and getting weaker and not able to walk like she was, feet are not going the way she wants them to go  . Other    Spinal stenosis, chronic kidney disease,  . Diabetes  . Anemia      HPI  This patient arrives today for the above.  Spinal stenosis: She has been having lower extremity weakness and longstanding back pain.  She did see orthopedics a few weeks ago and did undergo MRI about 5 days ago of her lumbar spine.  It did show stenosis at L4-L5 and L5-S1.  She tells me that since her MRI about 5 days ago she feels that her weakness has worsened a bit and she is concerned that her legs are getting give out on her.  She also mention she has fallen.  She tells me her sensation remains intact and she is not experiencing any new or worsening bowel/bladder incontinence.  She does have some bladder incontinence but this appears to be related to inability to ambulate to the bathroom quick enough.  She does have the sensation that she has to urinate and is able to hold it for a few minutes before she starts to have urine leaking.  This has been a problem for months and has not seem to have become acutely worse over the last 5 days.  She does use a rolling walker currently.  Chronic kidney disease 3B/anemia: She does follow with nephrology regarding her chronic kidney disease.  She did have iron infusions administered earlier this year.  She is not on an iron supplement currently.  I did check CBC approximately 2 weeks ago and she had mild normocytic anemia at that time.  She tells me she is scheduled to follow-up with her nephrologist next month, but not sure if he is going to check her iron levels.  She would like these to be monitored if  possible.  Diabetes: Last A1c was 7.4.  She continues on Trulicity and glipizide.  She is also on [place] 35 units daily.  This is increased from 30 units.  She saw her endocrinologist yesterday which was when her [place] dose was increased.  She denies any hypoglycemic events since yesterday.  She is not on metformin due to her kidney function.  She is on statin therapy and last LDL was 46.  Past Medical History:  Diagnosis Date  . Acid reflux disease   . Anemia   . Breast cancer ([place])    left side  . Chronic kidney disease   . Diabetes mellitus without complication ([place])   . Diverticulitis   . Hypertension   . Sleep apnea   . Thyroid disease       Family History  Problem Relation Age of Onset  . Diabetes Mother   . Thyroid disease Mother   . Colon cancer Neg Hx     Social History   Social History Narrative  . Not on file   Social History   Tobacco Use  . Smoking status: Never Smoker  . Smokeless tobacco: Never Used  Substance Use Topics  . Alcohol use: Not Currently     Current Meds  Medication Sig  . acetaminophen (TYLENOL)  500 MG tablet Take 1,000 mg by mouth as needed for moderate pain.   Marland Kitchen amitriptyline (ELAVIL) 50 MG tablet Take 50 mg by mouth at bedtime.  Marland Kitchen amLODipine (NORVASC) 10 MG tablet Take 10 mg by mouth daily.  Marland Kitchen aspirin EC 81 MG tablet Take 81 mg by mouth every morning. Swallow whole.  . beclomethasone (QVAR) 80 MCG/ACT inhaler Inhale 1 puff into the lungs 2 (two) times daily as needed (shortness of breath).  . Blood Glucose Monitoring Suppl (ACCU-CHEK GUIDE ME) w/Device KIT 1 Piece by Does not apply route as directed.  . cyclobenzaprine (FLEXERIL) 10 MG tablet Take 10 mg by mouth 3 (three) times daily as needed.  . Dulaglutide (TRULICITY) 1.5 DJ/2.4QA SOPN Inject 1.5 mg into the skin every Monday.  . furosemide (LASIX) 40 MG tablet Take 40 mg by mouth daily.  Marland Kitchen gabapentin (NEURONTIN) 300 MG capsule Take 1 capsule by mouth at bedtime.  Marland Kitchen GEMTESA  75 MG TABS Take 1 tablet by mouth daily.  Marland Kitchen glipiZIDE (GLUCOTROL XL) 5 MG 24 hr tablet Take 1 tablet (5 mg total) by mouth daily with breakfast.  . GLUCOSAMINE-CHONDROITIN PO Take 1 tablet by mouth daily.  Marland Kitchen glucose blood (ACCU-CHEK GUIDE) test strip Use as instructed  . insulin degludec (TRESIBA FLEXTOUCH) 100 UNIT/ML FlexTouch Pen Inject 35 Units into the skin daily.  . Insulin Pen Needle (B-D ULTRAFINE III SHORT PEN) 31G X 8 MM MISC 1 each by Does not apply route as directed.  . Lancets (ONETOUCH DELICA PLUS [id number]) MISC Apply topically 3 (three) times daily.  Marland Kitchen lidocaine (LIDODERM) 5 % Place 1 patch onto the skin daily as needed (pain). Remove & Discard patch within 12 hours or as directed by MD. Applied to lower back/shoulders/neck for pain  . lidocaine (XYLOCAINE) 2 % solution   . linaclotide (LINZESS) 72 MCG capsule Take 1 capsule (72 mcg total) by mouth daily before breakfast.  . losartan (COZAAR) 50 MG tablet Take 100 mg by mouth daily.  . magnesium oxide (MAG-OX) 400 MG tablet Take 400 mg by mouth daily.  . meclizine (ANTIVERT) 25 MG tablet Take 25 mg by mouth 3 (three) times daily as needed for dizziness.  . metoprolol tartrate (LOPRESSOR) 50 MG tablet Take 50 mg by mouth 2 (two) times daily.  . Naphazoline HCl (CLEAR EYES OP) Place 1 drop into both eyes daily as needed (dry eyes).  . OYSTER SHELL CALCIUM PLUS D 500-200 MG-UNIT TABS Take 1 tablet by mouth 3 (three) times daily.  . pantoprazole (PROTONIX) 40 MG tablet Take 1 tablet (40 mg total) by mouth daily. 30 minutes before breakfast  . phenazopyridine (PYRIDIUM) 200 MG tablet Take 1 tablet (200 mg total) by mouth 3 (three) times daily.  . polyethylene glycol (MIRALAX / GLYCOLAX) 17 g packet Take 17 g by mouth daily as needed for moderate constipation.  . rosuvastatin (CRESTOR) 10 MG tablet Take 10 mg by mouth at bedtime.   . sucralfate (CARAFATE) 1 GM/10ML suspension Take 1.5 g by mouth 3 (three) times daily as needed  (indigestion).   . verapamil (CALAN-SR) 180 MG CR tablet Take 180 mg by mouth daily.    ROS:  Review of Systems  Constitutional: Positive for malaise/fatigue.  Respiratory: Positive for shortness of breath (intermittenty with activity).   Cardiovascular: Negative for chest pain.  Musculoskeletal: Positive for back pain and falls.  Neurological: Positive for weakness. Negative for sensory change.      Objective:   Today's Vitals: BP 126/78  Pulse 84   Temp (!) 97.1 F (36.2 C) (Temporal)   Ht 5' 2.5" (1.588 m)   SpO2 98%   BMI 34.88 kg/m  Vitals with BMI 10/27/2020 10/26/2020 10/21/2020  Height 5' 2.5" 5' 2.5" 5' 2.5"  Weight (No Data) 193 lbs 13 oz 198 lbs  BMI - 83.15 17.61  Systolic 607 371 062  Diastolic 78 76 85  Pulse 84 75 75     Physical Exam Vitals reviewed.  Constitutional:      General: She is not in acute distress.    Appearance: Normal appearance.  HENT:     Head: Normocephalic and atraumatic.  Neck:     Vascular: No carotid bruit.  Cardiovascular:     Rate and Rhythm: Normal rate and regular rhythm.     Pulses: Normal pulses.     Heart sounds: Normal heart sounds.  Pulmonary:     Effort: Pulmonary effort is normal.     Breath sounds: Normal breath sounds.  Skin:    General: Skin is warm and dry.  Neurological:     General: No focal deficit present.     Mental Status: She is alert and oriented to person, place, and time.     Sensory: Sensation is intact.     Motor: Weakness present.     Coordination: Coordination is intact.     Gait: Gait abnormal (slow, taking small steps, using walker today).     Comments: (+) bilateral foot drop  Psychiatric:        Mood and Affect: Mood normal.        Behavior: Behavior normal.        Judgment: Judgment normal.          Assessment and Plan   1. Iron deficiency anemia, unspecified iron deficiency anemia type   2. Spinal stenosis of lumbosacral region   3. Type 2 diabetes mellitus with stage 3b  chronic kidney disease, without long-term current use of insulin ([place])      Plan: 1.  I am changing her nephrologist we will probably check her iron levels when she sees them next month, however because she is concerned about this I will check iron and ferritin levels today in addition to repeating her CBC. 2.  I did consult with Dr. Anastasio Champion regarding the patient's concerns with worsening weakness.  Because her sensation seems to remain intact and she is not having any acute signs of cauda equina syndrome I do not think she needs to proceed to the emergency department for emergent evaluation, Dr. Anastasio Champion is also agreeable with my assessment.  However, I am going to reach out to her orthopedic provider to explain these concerns as well to see if they have any recommendations.  For now I will prescribe a wheelchair that she can use at home to hopefully reduce risk of falling and I have recommended she follow-up with orthopedics as scheduled later in May.  She tells me she understands.  I also encouraged her to call her orthopedic doctor and express her concerns regarding her weakness as they may be able to get her in sooner than currently scheduled.  She tells me she understands. 3.  She will continue taking her medications as prescribed, and was encouraged to let either our office or her endocrinologist know if she is having hypoglycemic events.  She tells me she understands.   Tests ordered Orders Placed This Encounter  Procedures  . DME Wheelchair manual  . Iron  . Ferritin  .  CBC      No orders of the defined types were placed in this encounter.   Patient to follow-up with [place] in 3 months or sooner as needed.  Ailene Ards, NP

## 2020-10-28 ENCOUNTER — Telehealth: Payer: Self-pay

## 2020-10-28 LAB — IRON: Iron: 70 ug/dL (ref 45–160)

## 2020-10-28 LAB — CBC
HCT: 39.6 % (ref 35.0–45.0)
Hemoglobin: 12.4 g/dL (ref 11.7–15.5)
MCH: 25.5 pg — ABNORMAL LOW (ref 27.0–33.0)
MCHC: 31.3 g/dL — ABNORMAL LOW (ref 32.0–36.0)
MCV: 81.5 fL (ref 80.0–100.0)
MPV: 10.4 fL (ref 7.5–12.5)
Platelets: 273 10*3/uL (ref 140–400)
RBC: 4.86 10*6/uL (ref 3.80–5.10)
RDW: 16.3 % — ABNORMAL HIGH (ref 11.0–15.0)
WBC: 7.4 10*3/uL (ref 3.8–10.8)

## 2020-10-28 LAB — FERRITIN: Ferritin: 207 ng/mL (ref 16–288)

## 2020-10-28 NOTE — Telephone Encounter (Signed)
Patient would like to know if she can get a sooner appointment to review her MRI results.  Cb# [phone].  Please advise.  Thank you.

## 2020-10-28 NOTE — Telephone Encounter (Signed)
Moved appt to 11/04/20 @1 ;30

## 2020-11-03 ENCOUNTER — Ambulatory Visit (INDEPENDENT_AMBULATORY_CARE_PROVIDER_SITE_OTHER): Payer: Medicare HMO | Admitting: Internal Medicine

## 2020-11-03 ENCOUNTER — Encounter (INDEPENDENT_AMBULATORY_CARE_PROVIDER_SITE_OTHER): Payer: Self-pay

## 2020-11-03 ENCOUNTER — Other Ambulatory Visit: Payer: Self-pay

## 2020-11-03 ENCOUNTER — Encounter (INDEPENDENT_AMBULATORY_CARE_PROVIDER_SITE_OTHER): Payer: Self-pay | Admitting: Internal Medicine

## 2020-11-03 VITALS — BP 116/70 | HR 75 | Temp 97.0°F | Ht 62.5 in | Wt 193.2 lb

## 2020-11-03 DIAGNOSIS — M4807 Spinal stenosis, lumbosacral region: Secondary | ICD-10-CM

## 2020-11-03 DIAGNOSIS — R29898 Other symptoms and signs involving the musculoskeletal system: Secondary | ICD-10-CM | POA: Diagnosis not present

## 2020-11-03 DIAGNOSIS — E1122 Type 2 diabetes mellitus with diabetic chronic kidney disease: Secondary | ICD-10-CM | POA: Diagnosis not present

## 2020-11-03 DIAGNOSIS — R609 Edema, unspecified: Secondary | ICD-10-CM

## 2020-11-03 DIAGNOSIS — N1832 Chronic kidney disease, stage 3b: Secondary | ICD-10-CM | POA: Diagnosis not present

## 2020-11-03 NOTE — Patient Instructions (Signed)
[facility] Dietary Recommendations for Weight Loss What to Avoid . Avoid added sugars o Often added sugar can be found in processed foods such as many condiments, dry cereals, cakes, cookies, chips, crisps, crackers, candies, sweetened drinks, etc.  o Read labels and AVOID/DECREASE use of foods with the following in their ingredient list: Sugar, fructose, high fructose corn syrup, sucrose, glucose, maltose, dextrose, molasses, cane sugar, brown sugar, any type of syrup, agave nectar, etc.   . Avoid snacking in between meals . Avoid foods made with flour o If you are going to eat food made with flour, choose those made with whole-grains; and, minimize your consumption as much as is tolerable . Avoid processed foods o These foods are generally stocked in the middle of the grocery store. Focus on shopping on the perimeter of the grocery.  . Avoid Meat  o We recommend following a plant-based diet at [facility]. Thus, we recommend avoiding meat as a general rule. Consider eating beans, legumes, eggs, and/or dairy products for regular protein sources o If you plan on eating meat limit to 4 ounces of meat at a time and choose lean options such as Fish, chicken, turkey. Avoid red meat intake such as pork and/or steak What to Include . Vegetables o GREEN LEAFY VEGETABLES: Kale, spinach, mustard greens, collard greens, cabbage, broccoli, etc. o OTHER: Asparagus, cauliflower, eggplant, carrots, peas, Brussel sprouts, tomatoes, bell peppers, zucchini, beets, cucumbers, etc. . Grains, seeds, and legumes o Beans: kidney beans, black eyed peas, garbanzo beans, black beans, pinto beans, etc. o Whole, unrefined grains: brown rice, barley, bulgur, oatmeal, etc. . Healthy fats  o Avoid highly processed fats such as vegetable oil o Examples of healthy fats: avocado, olives, virgin olive oil, dark chocolate (?72% Cocoa), nuts (peanuts, almonds, walnuts, cashews, pecans, etc.) . None to Low  Intake of Animal Sources of Protein o Meat sources: chicken, turkey, salmon, tuna. Limit to 4 ounces of meat at one time. o Consider limiting dairy sources, but when choosing dairy focus on: PLAIN Greek yogurt, cottage cheese, high-protein milk . Fruit o Choose berries  When to Eat . Intermittent Fasting: o Choosing not to eat for a specific time period, but DO FOCUS ON HYDRATION when fasting o Multiple Techniques: - Time Restricted Eating: eat 3 meals in a day, each meal lasting no more than 60 minutes, no snacks between meals - 16-18 hour fast: fast for 16 to 18 hours up to 7 days a week. Often suggested to start with 2-3 nonconsecutive days per week.  . Remember the time you sleep is counted as fasting.  . Examples of eating schedule: Fast from 7:00pm-11:00am. Eat between 11:00am-7:00pm.  - 24-hour fast: fast for 24 hours up to every other day. Often suggested to start with 1 day per week . Remember the time you sleep is counted as fasting . Examples of eating schedule:  o Eating day: eat 2-3 meals on your eating day. If doing 2 meals, each meal should last no more than 90 minutes. If doing 3 meals, each meal should last no more than 60 minutes. Finish last meal by 7:00pm. o Fasting day: Fast until 7:00pm.  o IF YOU FEEL UNWELL FOR ANY REASON/IN ANY WAY WHEN FASTING, STOP FASTING BY EATING A NUTRITIOUS SNACK OR LIGHT MEAL o ALWAYS FOCUS ON HYDRATION DURING FASTS - Acceptable Hydration sources: water, broths, tea/coffee (black tea/coffee is best but using a small amount of whole-fat dairy products in coffee/tea is acceptable).  -   Poor Hydration Sources: anything with sugar or artificial sweeteners added to it  These recommendations have been developed for patients that are actively receiving medical care from either Dr. Ravynn Hogate or Sarah Gray, DNP, NP-C at [facility]. These recommendations are developed for patients with specific medical conditions and are not meant to be  distributed or used by others that are not actively receiving care from either provider listed above at [facility]. It is not appropriate to participate in the above eating plans without proper medical supervision.   Reference: Fung, J. The obesity code. [place]: [place]; 2016.   

## 2020-11-03 NOTE — Progress Notes (Signed)
Metrics: Intervention Frequency ACO  Documented Smoking Status Yearly  Screened one or more times in 24 months  Cessation Counseling or  Active cessation medication Past 24 months  Past 24 months   Guideline developer: UpToDate (See UpToDate for funding source) Date Released: 2014       Wellness Office Visit  Subjective:  Patient ID: Stephanie Moreno, female    DOB: 07-22-1945  Age: 75 y.o. MRN: [id number]  CC: This 75 year old lady, diabetic, hypertensive comes in because she needs documentation regarding a power wheelchair. HPI  On closer questioning, she told me that she has had left leg weakness since December 2021.  She does not remember if this was sudden onset.  She also does feel that her left arm is somewhat weaker than the right arm. She has pain when she stands up in the left hip and is due to see orthopedics again.  She did have a lumbar MRI spine done. She apparently has had falls at home. She is a diabetic on insulin along with Trulicity and glipizide.  She sees endocrinology.  Her last hemoglobin A1c was 7.4%.  Since being on insulin, she has gained weight.  This is frustrating for her. She is also concerned about bilateral leg edema.  She takes amlodipine and also takes furosemide 40 mg daily. Past Medical History:  Diagnosis Date  . Acid reflux disease   . Anemia   . Breast cancer ([place])    left side  . Chronic kidney disease   . Diabetes mellitus without complication ([place])   . Diverticulitis   . Hypertension   . Sleep apnea   . Thyroid disease    Past Surgical History:  Procedure Laterality Date  . BIOPSY  02/19/2020   Procedure: BIOPSY;  Surgeon: Eloise Harman, DO;  Location: [facility];  Service: Endoscopy;;  . BREAST LUMPECTOMY Left 2003  . BREAST SURGERY    . COLONOSCOPY WITH PROPOFOL N/A 02/19/2020   non-bleeding internal hemorrhoids, many small-mouthed diverticula in entire colon.  . ESOPHAGOGASTRODUODENOSCOPY (EGD) WITH PROPOFOL N/A 02/19/2020     benign-appearing esophageal stenosis s/p dilation, gastritis s/p biopsy, normal duodenum. Negative H.pylori, negative duodenal biopsy.   Marland Kitchen GIVENS CAPSULE STUDY N/A 06/14/2020   Procedure: GIVENS CAPSULE STUDY;  Surgeon: Eloise Harman, DO;  Location: [facility];  Service: Endoscopy;  Laterality: N/A;  7:30am  . THYROIDECTOMY    . tummy tuck     at time of breast surgery 2003     Family History  Problem Relation Age of Onset  . Diabetes Mother   . Thyroid disease Mother   . Colon cancer Neg Hx     Social History   Social History Narrative  . Not on file   Social History   Tobacco Use  . Smoking status: Never Smoker  . Smokeless tobacco: Never Used  Substance Use Topics  . Alcohol use: Not Currently    Current Meds  Medication Sig  . acetaminophen (TYLENOL) 500 MG tablet Take 1,000 mg by mouth as needed for moderate pain.   Marland Kitchen amitriptyline (ELAVIL) 50 MG tablet Take 50 mg by mouth at bedtime.  Marland Kitchen amLODipine (NORVASC) 10 MG tablet Take 10 mg by mouth daily.  Marland Kitchen aspirin EC 81 MG tablet Take 81 mg by mouth every morning. Swallow whole.  . beclomethasone (QVAR) 80 MCG/ACT inhaler Inhale 1 puff into the lungs 2 (two) times daily as needed (shortness of breath).  . Blood Glucose Monitoring Suppl ([place])  w/Device KIT 1 Piece by Does not apply route as directed.  . cyclobenzaprine (FLEXERIL) 10 MG tablet Take 10 mg by mouth 3 (three) times daily as needed.  . Dulaglutide (TRULICITY) 1.5 EV/0.3JK SOPN Inject 1.5 mg into the skin every Monday.  . furosemide (LASIX) 40 MG tablet Take 40 mg by mouth daily.  Marland Kitchen gabapentin (NEURONTIN) 300 MG capsule Take 1 capsule by mouth at bedtime.  Marland Kitchen GEMTESA 75 MG TABS Take 1 tablet by mouth daily.  Marland Kitchen glipiZIDE (GLUCOTROL XL) 5 MG 24 hr tablet Take 1 tablet (5 mg total) by mouth daily with breakfast.  . GLUCOSAMINE-CHONDROITIN PO Take 1 tablet by mouth daily.  Marland Kitchen glucose blood (ACCU-CHEK GUIDE) test strip Use as instructed  . insulin  degludec (TRESIBA FLEXTOUCH) 100 UNIT/ML FlexTouch Pen Inject 35 Units into the skin daily.  . Insulin Pen Needle (B-D ULTRAFINE III SHORT PEN) 31G X 8 MM MISC 1 each by Does not apply route as directed.  . Lancets (ONETOUCH DELICA PLUS [id number]) MISC Apply topically 3 (three) times daily.  Marland Kitchen lidocaine (LIDODERM) 5 % Place 1 patch onto the skin daily as needed (pain). Remove & Discard patch within 12 hours or as directed by MD. Applied to lower back/shoulders/neck for pain  . lidocaine (XYLOCAINE) 2 % solution   . linaclotide (LINZESS) 72 MCG capsule Take 1 capsule (72 mcg total) by mouth daily before breakfast.  . losartan (COZAAR) 50 MG tablet Take 100 mg by mouth daily.  . magnesium oxide (MAG-OX) 400 MG tablet Take 400 mg by mouth daily.  . meclizine (ANTIVERT) 25 MG tablet Take 25 mg by mouth 3 (three) times daily as needed for dizziness.  . metoprolol tartrate (LOPRESSOR) 50 MG tablet Take 50 mg by mouth 2 (two) times daily.  . Naphazoline HCl (CLEAR EYES OP) Place 1 drop into both eyes daily as needed (dry eyes).  . OYSTER SHELL CALCIUM PLUS D 500-200 MG-UNIT TABS Take 1 tablet by mouth 3 (three) times daily.  . pantoprazole (PROTONIX) 40 MG tablet Take 1 tablet (40 mg total) by mouth daily. 30 minutes before breakfast  . phenazopyridine (PYRIDIUM) 200 MG tablet Take 1 tablet (200 mg total) by mouth 3 (three) times daily.  . polyethylene glycol (MIRALAX / GLYCOLAX) 17 g packet Take 17 g by mouth daily as needed for moderate constipation.  . rosuvastatin (CRESTOR) 10 MG tablet Take 10 mg by mouth at bedtime.   . sucralfate (CARAFATE) 1 GM/10ML suspension Take 1.5 g by mouth 3 (three) times daily as needed (indigestion).   . verapamil (CALAN-SR) 180 MG CR tablet Take 180 mg by mouth daily.     [place] Office Visit from 10/07/2020 in [facility]  PHQ-9 Total Score 5      Objective:   Today's Vitals: BP 116/70   Pulse 75   Temp (!) 97 F (36.1 C) (Temporal)   Ht 5'  2.5" (1.588 m)   Wt 193 lb 3.2 oz (87.6 kg)   SpO2 96%   BMI 34.77 kg/m  Vitals with BMI 11/03/2020 10/27/2020 10/26/2020  Height 5' 2.5" 5' 2.5" 5' 2.5"  Weight 193 lbs 3 oz (No Data) 193 lbs 13 oz  BMI 37.16 - 96.78  Systolic 938 101 751  Diastolic 70 78 76  Pulse 75 84 75     Physical Exam  She is obese.  Blood pressure is excellent for her age.  She does have bilateral lower leg swelling with mild pitting edema.  Examination  of her left leg does show some weakness 4/5.  Subjectively, I also wonder if there is left arm weakness although this is not as clear.  Cranial nerves are intact.     Assessment   1. Left leg weakness   2. Type 2 diabetes mellitus with stage 3b chronic kidney disease, without long-term current use of insulin ([place])   3. Spinal stenosis of lumbosacral region   4. Edema, unspecified type       Tests ordered Orders Placed This Encounter  Procedures  . MR BRAIN WO CONTRAST     Plan: 1. I am concerned that she may have had a stroke in December 2021 when the left leg weakness started.  I will order an MRI brain scan to evaluate further. 2. I recommended that she reduce the amlodipine from 10 mg daily to a dose of 5 mg daily. 3. Continue with furosemide 40 mg daily. 4. We discussed nutrition as a way to improve her diabetes and eventually remove insulin altogether which is clearly causing her to gain weight.  We talked about intermittent fasting and a plant-based diet and I have given her diet sheet. 5. Follow-up as scheduled and I will try and fill out the forms for her wheelchair.  I have tried to see if we can do without this as this will make her more immobile, which in turn will lead to further complications. 6. Today I spent 45 minutes with this patient reviewing her medical chart, discussing insulin and its detrimental effects and reviewing her neurological history and examination.   No orders of the defined types were placed in this  encounter.   Doree Albee, MD

## 2020-11-04 ENCOUNTER — Telehealth (INDEPENDENT_AMBULATORY_CARE_PROVIDER_SITE_OTHER): Payer: Self-pay | Admitting: Nurse Practitioner

## 2020-11-04 ENCOUNTER — Encounter: Payer: Self-pay | Admitting: Specialist

## 2020-11-04 ENCOUNTER — Ambulatory Visit (INDEPENDENT_AMBULATORY_CARE_PROVIDER_SITE_OTHER): Payer: Medicare HMO | Admitting: Specialist

## 2020-11-04 VITALS — BP 139/88 | HR 87 | Ht 62.5 in | Wt 194.6 lb

## 2020-11-04 DIAGNOSIS — M5136 Other intervertebral disc degeneration, lumbar region: Secondary | ICD-10-CM | POA: Diagnosis not present

## 2020-11-04 DIAGNOSIS — M4726 Other spondylosis with radiculopathy, lumbar region: Secondary | ICD-10-CM

## 2020-11-04 DIAGNOSIS — M48062 Spinal stenosis, lumbar region with neurogenic claudication: Secondary | ICD-10-CM

## 2020-11-04 NOTE — Patient Instructions (Addendum)
Avoid bending, stooping and avoid lifting weights greater than 10 lbs. Avoid prolong standing and walking. Order for a new walker with wheels. Surgery scheduling secretary Kandice Hams, will call you in the next week to schedule for surgery.  Surgery recommended is a two level lumbar central laminectomy L4-5 and L5-S1 this would be done with microscope. Take hydrocodone for for pain. Risk of surgery includes risk of infection 1 in 200 patients, bleeding 1/4% chance you would need a transfusion.   Risk to the nerves is one in 10,000. Expect improved walking and standing tolerance. Expect relief of leg pain but numbness may persist depending on the length and degree of pressure that has been present. May need to decrease or stop gabapentin as it can cause fluid retention.

## 2020-11-04 NOTE — Telephone Encounter (Signed)
Called patient and NOT able to LMOVM to return call  Called patient and was NOT able to leave a message. The phone just kept ringing so I will try back another time.

## 2020-11-04 NOTE — Telephone Encounter (Signed)
Please call this patient and notify her that I had ordered a manual wheelchair that she could use in the home as needed to hopefully prevent falls. I got paperwork to fill out for a powered wheelchair approved. Can you ask her why she is looking into a powered wheelchair as opposed to a manual wheelchair? If she would like a powered wheelchair we will need to schedule her for another mobility visit with me as soon as possible. I will need an hour to complete this visit and the paperwork associated with it. Thank you.

## 2020-11-04 NOTE — Progress Notes (Signed)
Office Visit Note   Patient: Stephanie Moreno           Date of Birth: 1946-02-20           MRN: [id number] Visit Date: 11/04/2020              Requested by: Stephanie Ards, NP [address] [place],  [place] [address] PCP: Stephanie Ards, NP   Assessment & Plan: Visit Diagnoses:  1. Spinal stenosis of lumbar region with neurogenic claudication   2. DDD (degenerative disc disease), lumbar   3. Other spondylosis with radiculopathy, lumbar region     Plan: Avoid bending, stooping and avoid lifting weights greater than 10 lbs. Avoid prolong standing and walking. Order for a new walker with wheels. Surgery scheduling secretary Stephanie Moreno, will call you in the next week to schedule for surgery.  Surgery recommended is a two level lumbar central laminectomy L4-5 and L5-S1 this would be done with microscope. Take hydrocodone for for pain. Risk of surgery includes risk of infection 1 in 200 patients, bleeding 1/4% chance you would need a transfusion.   Risk to the nerves is one in 10,000. Expect improved walking and standing tolerance. Expect relief of leg pain but numbness may persist depending on the length and degree of pressure that has been present.  Follow-Up Instructions: Return in about 4 weeks (around 12/02/2020).   Orders:  No orders of the defined types were placed in this encounter.  No orders of the defined types were placed in this encounter.     Procedures: No procedures performed   Clinical Data: No additional findings.   Subjective: No chief complaint on file.   75 year old female with about a year history of increasing pain into the left hip and radiates down the left leg. With numbness into the hands and feet, she takes gabapentin. She has 3-4 year history of diabetes and is on oral antihyperglycemics and insulin 34 units of insulin at night. She is having pain with sitting and sleeping and worse with standing and the left leg is weak. She has apparantly  fallen twice last month and none in the last weak. The left knee gives out on her. No bowel or bladder difficulties. She has pain with bending, stooping and twisting.  Has had injections with steroid in [place]. That did not last long and were painful.   Review of Systems  Constitutional: Positive for unexpected weight change (weight gain). Negative for activity change, appetite change, chills, diaphoresis, fatigue and fever.  HENT: Positive for congestion and dental problem. Negative for drooling, ear discharge, ear pain, facial swelling, hearing loss, mouth sores, nosebleeds, postnasal drip, rhinorrhea, sinus pressure, sinus pain, sneezing, sore throat, tinnitus, trouble swallowing and voice change.   Eyes: Positive for visual disturbance (has glaucoma and cataracts.). Negative for photophobia, pain, discharge, redness and itching.  Respiratory: Positive for apnea and wheezing. Negative for cough, choking, chest tightness, shortness of breath and stridor.   Cardiovascular: Positive for leg swelling. Negative for chest pain and palpitations.  Gastrointestinal: Positive for abdominal distention and constipation (on medication for constipation, takes miralax). Negative for abdominal pain, anal bleeding, blood in stool, diarrhea, nausea, rectal pain and vomiting.  Endocrine: Negative.  Negative for cold intolerance and heat intolerance.  Genitourinary: Negative.  Negative for difficulty urinating, dyspareunia, dysuria, enuresis, flank pain, frequency, genital sores and hematuria.  Musculoskeletal: Positive for back pain and gait problem. Negative for arthralgias, joint swelling, myalgias, neck pain and  neck stiffness.  Skin: Negative.  Negative for color change, pallor, rash and wound.  Allergic/Immunologic: Negative.  Negative for environmental allergies, food allergies and immunocompromised state.  Neurological: Positive for weakness, light-headedness and numbness. Negative for dizziness, tremors,  seizures, syncope, facial asymmetry, speech difficulty and headaches.  Hematological: Negative.  Negative for adenopathy. Does not bruise/bleed easily.  Psychiatric/Behavioral: Negative.  Negative for agitation, behavioral problems, confusion, decreased concentration, dysphoric mood, hallucinations, self-injury, sleep disturbance and suicidal ideas. The patient is not nervous/anxious and is not hyperactive.      Objective: Vital Signs: BP 139/88 (BP Location: Left Arm, Patient Position: Sitting, Cuff Size: Normal)   Pulse 87   Ht 5' 2.5" (1.588 m)   Wt 194 lb 9.6 oz (88.3 kg)   BMI 35.03 kg/m   Physical Exam Constitutional:      Appearance: She is well-developed.  HENT:     Head: Normocephalic and atraumatic.  Eyes:     Pupils: Pupils are equal, round, and reactive to light.  Pulmonary:     Effort: Pulmonary effort is normal.     Breath sounds: Normal breath sounds.  Abdominal:     General: Bowel sounds are normal.     Palpations: Abdomen is soft.  Musculoskeletal:        General: Normal range of motion.     Cervical back: Normal range of motion and neck supple.  Skin:    General: Skin is warm and dry.  Neurological:     Mental Status: She is alert and oriented to person, place, and time.  Psychiatric:        Behavior: Behavior normal.        Thought Content: Thought content normal.        Judgment: Judgment normal.     Ortho Exam  Specialty Comments:  No specialty comments available.  Imaging: No results found.   PMFS History: Patient Active Problem List   Diagnosis Date Noted  . Type 2 diabetes mellitus with stage 3b chronic kidney disease, without long-term current use of insulin ([facility] [facility]) 07/12/2020  . Mixed hyperlipidemia 07/12/2020  . Essential hypertension, benign 07/12/2020  . Iron deficiency anemia   . Loss of weight 04/09/2020  . Dyspepsia 01/28/2020  . Constipation 01/28/2020  . Lower abdominal pain 01/28/2020  . Normocytic anemia 01/28/2020    Past Medical History:  Diagnosis Date  . Acid reflux disease   . Anemia   . Breast cancer ([facility])    left side  . Chronic kidney disease   . Diabetes mellitus without complication ([facility])   . Diverticulitis   . Hypertension   . Sleep apnea   . Thyroid disease     Family History  Problem Relation Age of Onset  . Diabetes Mother   . Thyroid disease Mother   . Colon cancer Neg Hx     Past Surgical History:  Procedure Laterality Date  . BIOPSY  02/19/2020   Procedure: BIOPSY;  Surgeon: Eloise Harman, DO;  [facility];  Service: Endoscopy;;  . BREAST LUMPECTOMY Left 2003  . BREAST SURGERY    . COLONOSCOPY WITH PROPOFOL N/A 02/19/2020   non-bleeding internal hemorrhoids, many small-mouthed diverticula in entire colon.  . ESOPHAGOGASTRODUODENOSCOPY (EGD) WITH PROPOFOL N/A 02/19/2020    benign-appearing esophageal stenosis s/p dilation, gastritis s/p biopsy, normal duodenum. Negative H.pylori, negative duodenal biopsy.   Marland Kitchen GIVENS CAPSULE STUDY N/A 06/14/2020   Procedure: GIVENS CAPSULE STUDY;  Surgeon: Eloise Harman, DO;  [facility];  Service: Endoscopy;  Laterality: N/A;  7:30am  . THYROIDECTOMY    . tummy tuck     at time of breast surgery 2003   Social History   Occupational History  . 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 Smoking status: Never Smoker  . Smokeless tobacco: Never Used  Vaping Use  . Vaping Use: Never used  Substance and Sexual Activity  . Alcohol use: Not Currently  . Drug use: Not Currently  . Sexual activity: Not on file

## 2020-11-09 NOTE — Telephone Encounter (Signed)
Patient came in recently to see Dr. Anastasio Champion about the power wheelchair. I thought he completed some paperwork. Do you need to see her again?

## 2020-11-10 ENCOUNTER — Telehealth: Payer: Self-pay

## 2020-11-10 NOTE — Telephone Encounter (Signed)
Patient called she stated she has been anxious about having surgery patient sated her bp and her sugar has been high in the last couple of days call [phone]

## 2020-11-10 NOTE — Telephone Encounter (Signed)
Called patient and she scheduled her appointment at our first opening on 12/15/2020 at 2:30pm. I gave patient the information and she stated that she had the accident this morning and has not had another accident today and will call ortho today. Patient verbalized an understanding and stated that she will proceed to ER if her symptoms worsen or has more changes.

## 2020-11-10 NOTE — Telephone Encounter (Signed)
Patient called back and stated that she is in worse shape now. Patient stated that she was trying to get up this morning and urinated on herself and this is the first time this has happened. Patient also stated that she was having more hand and arm trouble and not able to use her arms and hands as she was before. Patient stated she was going to let Ortho know what happened and she is waiting to get her surgery scheduled.   Patient has not received a manual wheelchair yet.  Patient stated that because of her worsening condition she felt that it was best to get a power wheelchair so she could get around especially after her surgery.  Have you received clearance paperwork to proceed with surgery?

## 2020-11-10 NOTE — Telephone Encounter (Signed)
See the original note I sent to you about a week ago on this thread. I had ordered her a manual wheelchair. If she absolutely needs a powered chair she will need to come back for a 60 minute appointment to complete the paperwork. The visit Dr. Anastasio Champion did not entirely fulfill what the insurance company requires for documentation for the wheelchair. You can make the appointment with me if she needs it.

## 2020-11-10 NOTE — Telephone Encounter (Signed)
Called patient and NOT able to LMOVM to return call  Not able to leave a detailed voice message. I will try again at another time.

## 2020-11-10 NOTE — Telephone Encounter (Signed)
Patient is requesting to be worked into Dr.Nitkas schedule if possible

## 2020-11-10 NOTE — Telephone Encounter (Signed)
As far as I know we haven't gotten any surgical clearance paperwork. She does need another appointment to do the mobility exam for the powered chair. Also, if she is having acute symptoms she needs to call her ortho doc immediately or proceed to the ER in case she needs emergent surgery.

## 2020-11-10 NOTE — Telephone Encounter (Signed)
Worked her for 11/11/20 @ 915

## 2020-11-11 ENCOUNTER — Ambulatory Visit (INDEPENDENT_AMBULATORY_CARE_PROVIDER_SITE_OTHER): Payer: Medicare HMO

## 2020-11-11 ENCOUNTER — Encounter: Payer: Self-pay | Admitting: Specialist

## 2020-11-11 ENCOUNTER — Other Ambulatory Visit: Payer: Self-pay

## 2020-11-11 ENCOUNTER — Ambulatory Visit (INDEPENDENT_AMBULATORY_CARE_PROVIDER_SITE_OTHER): Payer: Medicare HMO | Admitting: Specialist

## 2020-11-11 VITALS — BP 160/82 | HR 84 | Ht 62.5 in | Wt 195.0 lb

## 2020-11-11 DIAGNOSIS — M5136 Other intervertebral disc degeneration, lumbar region: Secondary | ICD-10-CM

## 2020-11-11 DIAGNOSIS — N189 Chronic kidney disease, unspecified: Secondary | ICD-10-CM | POA: Diagnosis not present

## 2020-11-11 DIAGNOSIS — E1122 Type 2 diabetes mellitus with diabetic chronic kidney disease: Secondary | ICD-10-CM | POA: Diagnosis not present

## 2020-11-11 DIAGNOSIS — M4317 Spondylolisthesis, lumbosacral region: Secondary | ICD-10-CM

## 2020-11-11 DIAGNOSIS — E1129 Type 2 diabetes mellitus with other diabetic kidney complication: Secondary | ICD-10-CM | POA: Diagnosis not present

## 2020-11-11 DIAGNOSIS — M5416 Radiculopathy, lumbar region: Secondary | ICD-10-CM | POA: Diagnosis not present

## 2020-11-11 DIAGNOSIS — M48062 Spinal stenosis, lumbar region with neurogenic claudication: Secondary | ICD-10-CM

## 2020-11-11 DIAGNOSIS — G9519 Other vascular myelopathies: Secondary | ICD-10-CM

## 2020-11-11 DIAGNOSIS — I129 Hypertensive chronic kidney disease with stage 1 through stage 4 chronic kidney disease, or unspecified chronic kidney disease: Secondary | ICD-10-CM | POA: Diagnosis not present

## 2020-11-11 DIAGNOSIS — D508 Other iron deficiency anemias: Secondary | ICD-10-CM | POA: Diagnosis not present

## 2020-11-11 DIAGNOSIS — R768 Other specified abnormal immunological findings in serum: Secondary | ICD-10-CM | POA: Diagnosis not present

## 2020-11-11 DIAGNOSIS — R809 Proteinuria, unspecified: Secondary | ICD-10-CM | POA: Diagnosis not present

## 2020-11-11 DIAGNOSIS — R600 Localized edema: Secondary | ICD-10-CM | POA: Diagnosis not present

## 2020-11-11 DIAGNOSIS — D638 Anemia in other chronic diseases classified elsewhere: Secondary | ICD-10-CM | POA: Diagnosis not present

## 2020-11-11 MED ORDER — HYDROCODONE-ACETAMINOPHEN 5-325 MG PO TABS: 1.0000 | ORAL_TABLET | Freq: Four times a day (QID) | ORAL | 0 refills | Status: DC | PRN

## 2020-11-11 NOTE — Progress Notes (Addendum)
Office Visit Note   Patient: Stephanie Moreno           Date of Birth: 06-Aug-1945           MRN: [id number] Visit Date: 11/11/2020              Requested by: Ailene Ards, NP [address] [place],  [place] [address] PCP: Ailene Ards, NP   Assessment & Plan: Visit Diagnoses:  1. Spinal stenosis, lumbar region, with neurogenic claudication   2. Spinal stenosis of lumbar region with neurogenic claudication   3. Neurogenic claudication ([facility])   4. DDD (degenerative disc disease), lumbar   5. Radiculopathy, lumbar region   6. 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 Avoid prolong standing and walking. Order for a new walker with wheels. Surgery scheduling secretary Kandice Hams, will call you in the next week to schedule for surgery.  Surgery recommended is a two level lumbar decompressive central laminectomy L4-5 and L5-S1 and one level fusion L5-S1 this would be done with rods, screws and cages with local bone graft and allograft (donor bone graft). Take hydrocodone for for pain. Risk of surgery includes risk of infection 1 in 200 patients, bleeding 1/2% chance you would need a transfusion.   Risk to the nerves is one in 10,000. You will need to use a brace for 3 months and wean from the brace on the 4th month. Expect improved walking and standing tolerance. Expect relief of leg pain but numbness may persist depending on the length and degree of pressure that has been present.    Follow-Up Instructions: No follow-ups on file.   Orders:  Orders Placed This Encounter  Procedures  . XR Lumb Spine Flex&Ext Only   Meds ordered this encounter  Medications  . HYDROcodone-acetaminophen (NORCO/VICODIN) 5-325 MG tablet    Sig: Take 1 tablet by mouth every 6 (six) hours as needed for moderate pain.    Dispense:  30 tablet    Refill:  0      Procedures: No procedures performed   Clinical Data: No  additional findings.   Subjective: Chief Complaint  Patient presents with  . Lower Back - Follow-up    75 year old female with a 3 year history of increasing back pain more recently with increasing pain into both legs the left leg greater than right. She has been seen by Benjiman Core and evaluated and an MRI done. She notes that she has difficulty with intermittant urinary incontinence. Difficulty standing and walking. Pain and leg weakness improve with sitting but then the pain worsens again. She was seen last week and her Studies show moderate spinal stenosis L4-5 and L5-S1 with enlarged facet joints. The central canal is moderately narrowed and there is not A great deal of sacral or central nerve compression. She returns today. Reportedly was seen by neurology and today is to see the nephrologist. She had a worsening of pain yesterday and difficulty getting out of bed. She reportedly lives alone and if she undergoes surgery will likely need to  Consider a SNF as she is unable to get assistance at home. Reportedly moved to [place] about 3 years ago.    Review of Systems  Constitutional: Negative.   HENT: Negative.   Eyes: Negative.   Respiratory: Negative.   Cardiovascular: Negative.   Gastrointestinal: Negative.   Endocrine: Negative.   Genitourinary: Negative.   Musculoskeletal:  Negative.   Skin: Negative.   Allergic/Immunologic: Negative.   Neurological: Negative.   Hematological: Negative.   Psychiatric/Behavioral: Negative.      Objective: Vital Signs: BP (!) 160/82 (BP Location: Left Arm, Patient Position: Sitting)   Pulse 84   Ht 5' 2.5" (1.588 m)   Wt 195 lb (88.5 kg)   BMI 35.10 kg/m   Physical Exam Constitutional:      Appearance: She is well-developed.  HENT:     Head: Normocephalic and atraumatic.  Eyes:     Pupils: Pupils are equal, round, and reactive to light.  Pulmonary:     Effort: Pulmonary effort is normal.     Breath sounds: Normal breath sounds.   Abdominal:     General: Bowel sounds are normal.     Palpations: Abdomen is soft.  Musculoskeletal:     Cervical back: Normal range of motion and neck supple.     Lumbar back: Negative right straight leg raise test and negative left straight leg raise test.  Skin:    General: Skin is warm and dry.  Neurological:     Mental Status: She is alert and oriented to person, place, and time.  Psychiatric:        Behavior: Behavior normal.        Thought Content: Thought content normal.        Judgment: Judgment normal.     Back Exam   Tenderness  The patient is experiencing tenderness in the lumbar.  Range of Motion  Extension: abnormal  Flexion: abnormal  Lateral bend right: abnormal  Lateral bend left: abnormal  Rotation right: abnormal  Rotation left: abnormal   Muscle Strength  Right Quadriceps:  5/5  Left Quadriceps:  4/5  Right Hamstrings:  5/5  Left Hamstrings:  5/5   Tests  Straight leg raise right: negative Straight leg raise left: negative  Reflexes  Patellar: 0/4 Achilles: 0/4  Other  Toe walk: abnormal Heel walk: abnormal Sensation: decreased      Specialty Comments:  No specialty comments available.  Imaging: No results found.   PMFS History: Patient Active Problem List   Diagnosis Date Noted  . GERD (gastroesophageal reflux disease) 11/18/2020  . Type 2 diabetes mellitus with stage 3b chronic kidney disease, without long-term current use of insulin ([name]) 07/12/2020  . Mixed hyperlipidemia 07/12/2020  . Essential hypertension, benign 07/12/2020  . Iron deficiency anemia   . Loss of weight 04/09/2020  . Dyspepsia 01/28/2020  . Constipation 01/28/2020  . Lower abdominal pain 01/28/2020  . Normocytic anemia 01/28/2020   Past Medical History:  Diagnosis Date  . Acid reflux disease   . Anemia   . Breast cancer ([name])    left side  . Chronic kidney disease   . Diabetes mellitus without complication ([name])   . Diverticulitis   .  Hypertension   . Sleep apnea   . Thyroid disease     Family History  Problem Relation Age of Onset  . Diabetes Mother   . Thyroid disease Mother   . Colon cancer Neg Hx     Past Surgical History:  Procedure Laterality Date  . BIOPSY  02/19/2020   Procedure: BIOPSY;  Surgeon: Eloise Harman, DO;  [facility];  Service: Endoscopy;;  . BREAST LUMPECTOMY Left 2003  . BREAST SURGERY    . COLONOSCOPY WITH PROPOFOL N/A 02/19/2020   non-bleeding internal hemorrhoids, many small-mouthed diverticula in entire colon.  . ESOPHAGOGASTRODUODENOSCOPY (EGD) WITH PROPOFOL N/A 02/19/2020  benign-appearing esophageal stenosis s/p dilation, gastritis s/p biopsy, normal duodenum. Negative H.pylori, negative duodenal biopsy.   Marland Kitchen GIVENS CAPSULE STUDY N/A 06/14/2020   Procedure: GIVENS CAPSULE STUDY;  Surgeon: Eloise Harman, DO;  [facility];  Service: Endoscopy;  Laterality: N/A;  7:30am  . THYROIDECTOMY    . tummy tuck     at time of breast surgery 2003   Social History   Occupational History  . 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 Smoking status: Never Smoker  . Smokeless tobacco: Never Used  Vaping Use  . Vaping Use: Never used  Substance and Sexual Activity  . Alcohol use: Not Currently  . Drug use: Not Currently  . Sexual activity: Not on file

## 2020-11-11 NOTE — Patient Instructions (Signed)
Plan: Avoid bending, stooping and avoid lifting weights greater than 10 lbs. Avoid prolong standing and walking. Order for a new walker with wheels. Surgery scheduling secretary Kandice Hams, will call you in the next week to schedule for surgery.  Surgery recommended is a two level lumbar decompressive central laminectomy L4-5 and L5-S1 and one level fusion L5-S1 this would be done with rods, screws and cages with local bone graft and allograft (donor bone graft). Take hydrocodone for for pain. Risk of surgery includes risk of infection 1 in 200 patients, bleeding less than 1/4% chance you would need a transfusion.   Risk to the nerves is one in 10,000. You will need to use a brace for 3 months and wean from the brace on the 4th month. Expect improved walking and standing tolerance. Expect relief of leg pain but numbness may persist depending on the length and degree of pressure that has been present.

## 2020-11-17 ENCOUNTER — Ambulatory Visit (HOSPITAL_COMMUNITY): Payer: Medicare HMO

## 2020-11-18 ENCOUNTER — Encounter: Payer: Self-pay | Admitting: Gastroenterology

## 2020-11-18 ENCOUNTER — Other Ambulatory Visit: Payer: Self-pay

## 2020-11-18 ENCOUNTER — Ambulatory Visit (INDEPENDENT_AMBULATORY_CARE_PROVIDER_SITE_OTHER): Payer: Medicare HMO | Admitting: Gastroenterology

## 2020-11-18 VITALS — BP 127/75 | HR 84 | Temp 97.3°F | Ht 63.0 in | Wt 191.6 lb

## 2020-11-18 DIAGNOSIS — D509 Iron deficiency anemia, unspecified: Secondary | ICD-10-CM | POA: Diagnosis not present

## 2020-11-18 DIAGNOSIS — K5900 Constipation, unspecified: Secondary | ICD-10-CM

## 2020-11-18 DIAGNOSIS — R7989 Other specified abnormal findings of blood chemistry: Secondary | ICD-10-CM

## 2020-11-18 DIAGNOSIS — K219 Gastro-esophageal reflux disease without esophagitis: Secondary | ICD-10-CM | POA: Insufficient documentation

## 2020-11-18 MED ORDER — PANTOPRAZOLE SODIUM 40 MG PO TBEC: 40.0000 mg | DELAYED_RELEASE_TABLET | Freq: Every day | ORAL | 3 refills | Status: DC

## 2020-11-18 MED ORDER — LINACLOTIDE 145 MCG PO CAPS: 145.0000 ug | ORAL_CAPSULE | Freq: Every day | ORAL | 5 refills | Status: DC

## 2020-11-18 NOTE — Patient Instructions (Signed)
Continue Protonix daily.   I have sent in a prescription for a higher dose of Linzess. I have provided samples of this to try as well. Let me know if this is helpful for you!  We will repeat liver numbers in July and mail this to you.  We will see you in 6 months. I hope you feel better soon!  I enjoyed seeing you again today! As you know, I value our relationship and want to provide genuine, compassionate, and quality care. I welcome your feedback. If you receive a survey regarding your visit,  I greatly appreciate you taking time to fill this out. See you next time!  Annitta Needs, PhD, ANP-BC [facility]

## 2020-11-18 NOTE — Progress Notes (Signed)
Referring Provider: Pablo Lawrence, NP Primary Care Physician:  Ailene Ards, NP Primary GI: Dr. Abbey Chatters    Chief Complaint  Patient presents with  . Constipation  . dyspepsia    HPI:   Stephanie Moreno is a 75 y.o. female presenting today with a long-standing history of abdominal pain and nausea, constipation, GERD, previously established with GI in [place] but moved to this area in June 2021. Colonoscopy and EGD completed recently with internal hemorrhoids on colonoscopy andbenign-appearing esophageal stenosis s/p dilation, gastritis s/p biopsy, normal duodenum. Negative H.pylori, negative duodenal biopsy. Gallbladder remains in situ. Due to mildly dilated CBD on CT, MRI was completed which showedmild dilation of CBD measuring up to 36m, likely age-related. 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 Due to worsening IDA, she underwent capsule study with few non-bleeding erosions in small bowl, single polypoid type lesion that did not apper significant.    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 Continues to follow with Nephrology.   Constipation: Linzess 72 mcg daily. Miralax every other day. Feels like she could use a higher dosage.   GERD: Protonix daily. Feels this helps with GERD. Mild nausea. No vomiting. Appetite so-so, eating about once per day. Eats toast with PB and banana.   08/02/20: Tbili 0.3, Alk Phos mildly elevated at 129, AST 28, ALT 22. Known fatty liver. Will update now.    Needs back surgery. Difficulty with ambulating and using walker.   Past Medical History:  Diagnosis Date  . Acid reflux disease   . Anemia   . [place]    [place]  . Chronic kidney disease   . Diabetes mellitus without complication ([facility]   . Diverticulitis   . Hypertension   . Sleep apnea    . Thyroid disease     Past Surgical History:  Procedure Laterality Date  . BIOPSY  02/19/2020   Procedure: BIOPSY;  Surgeon: CEloise Harman DO;  Location: [facility];  Service: Endoscopy;;  . BREAST LUMPECTOMY Left 2003  . BREAST SURGERY    . COLONOSCOPY WITH PROPOFOL N/A 02/19/2020   non-bleeding internal hemorrhoids, many small-mouthed diverticula in entire colon.  . ESOPHAGOGASTRODUODENOSCOPY (EGD) WITH PROPOFOL N/A 02/19/2020    benign-appearing esophageal stenosis s/p dilation, gastritis s/p biopsy, normal duodenum. Negative H.pylori, negative duodenal biopsy.   .Marland KitchenGIVENS CAPSULE STUDY N/A 06/14/2020   Procedure: GIVENS CAPSULE STUDY;  Surgeon: CEloise Harman DO;  Location: [facility];  Service: Endoscopy;  Laterality: N/A;  7:30am  . THYROIDECTOMY    . tummy tuck     at time of breast surgery 2003    Current Outpatient Medications  Medication Sig Dispense Refill  . acetaminophen (TYLENOL) 500 MG tablet Take 1,000 mg by mouth as needed for moderate pain.     .Marland Kitchenamitriptyline (ELAVIL) 50 MG tablet Take 50 mg by mouth at bedtime.    .Marland KitchenamLODipine (NORVASC) 10 MG tablet Take 10 mg by mouth daily.    .Marland Kitchenaspirin EC 81 MG tablet Take 81 mg by mouth every morning. Swallow whole.    . beclomethasone (QVAR) 80 MCG/ACT inhaler Inhale 1 puff into the lungs 2 (two) times daily as needed (shortness of breath).    . Blood Glucose Monitoring Suppl (ACCU-CHEK GUIDE ME) w/Device KIT 1 Piece by Does not apply route as directed. 1  kit 0  . cyclobenzaprine (FLEXERIL) 10 MG tablet Take 10 mg by mouth 3 (three) times daily as needed.    . Dulaglutide (TRULICITY) 1.5 RX/5.4MG SOPN Inject 1.5 mg into the skin every Monday.    . furosemide (LASIX) 40 MG tablet Take 40 mg by mouth daily.    Marland Kitchen gabapentin (NEURONTIN) 300 MG capsule Take 1 capsule by mouth at bedtime.    Marland Kitchen GEMTESA 75 MG TABS Take 1 tablet by mouth daily.    Marland Kitchen glipiZIDE (GLUCOTROL XL) 5 MG 24 hr tablet Take 1 tablet (5 mg total) by  mouth daily with breakfast. 90 tablet 3  . GLUCOSAMINE-CHONDROITIN PO Take 1 tablet by mouth daily.    Marland Kitchen glucose blood (ACCU-CHEK GUIDE) test strip Use as instructed 150 each 2  . HYDROcodone-acetaminophen (NORCO/VICODIN) 5-325 MG tablet Take 1 tablet by mouth every 6 (six) hours as needed for moderate pain. 30 tablet 0  . insulin degludec (TRESIBA FLEXTOUCH) 100 UNIT/ML FlexTouch Pen Inject 35 Units into the skin daily. (Patient taking differently: Inject 34 Units into the skin daily.) 30 mL 3  . Insulin Pen Needle (B-D ULTRAFINE III SHORT PEN) 31G X 8 MM MISC 1 each by Does not apply route as directed. 100 each 3  . Lancets (ONETOUCH DELICA PLUS QQPYPP50D) MISC Apply topically 3 (three) times daily.    Marland Kitchen lidocaine (LIDODERM) 5 % Place 1 patch onto the skin daily as needed (pain). Remove & Discard patch within 12 hours or as directed by MD. Applied to lower back/shoulders/neck for pain    . linaclotide (LINZESS) 145 MCG CAPS capsule Take 1 capsule (145 mcg total) by mouth daily before breakfast. 30 capsule 5  . losartan (COZAAR) 50 MG tablet Take 100 mg by mouth daily.    . magnesium oxide (MAG-OX) 400 MG tablet Take 400 mg by mouth daily.    . meclizine (ANTIVERT) 25 MG tablet Take 25 mg by mouth 3 (three) times daily as needed for dizziness.    . metoprolol tartrate (LOPRESSOR) 50 MG tablet Take 50 mg by mouth 2 (two) times daily.    . Naphazoline HCl (CLEAR EYES OP) Place 1 drop into both eyes daily as needed (dry eyes).    . OYSTER SHELL CALCIUM PLUS D 500-200 MG-UNIT TABS Take 1 tablet by mouth 3 (three) times daily.    . phenazopyridine (PYRIDIUM) 200 MG tablet Take 1 tablet (200 mg total) by mouth 3 (three) times daily. 6 tablet 0  . polyethylene glycol (MIRALAX / GLYCOLAX) 17 g packet Take 17 g by mouth daily as needed for moderate constipation.    . rosuvastatin (CRESTOR) 10 MG tablet Take 10 mg by mouth at bedtime.     . sucralfate (CARAFATE) 1 GM/10ML suspension Take 1.5 g by mouth 3  (three) times daily as needed (indigestion).     . verapamil (CALAN-SR) 180 MG CR tablet Take 180 mg by mouth daily.    Marland Kitchen doxycycline (VIBRA-TABS) 100 MG tablet Take 1 tablet (100 mg total) by mouth 2 (two) times daily. 20 tablet 0  . pantoprazole (PROTONIX) 40 MG tablet Take 1 tablet (40 mg total) by mouth daily. 30 minutes before breakfast 90 tablet 3   No current facility-administered medications for this visit.    Allergies as of 11/18/2020 - Review Complete 11/18/2020  Allergen Reaction Noted  . Codeine Nausea And Vomiting 01/05/2020  . Lisinopril Nausea And Vomiting 01/05/2020  . Penicillins Hives 01/05/2020    Family History  Problem Relation  Age of Onset  . Diabetes Mother   . Thyroid disease Mother   . Colon cancer Neg Hx     Social History   Socioeconomic History  . Marital status: Divorced    Spouse name: Not on file  . Number of children: Not on file  . Years of education: Not on file  . Highest education level: Not on file  Occupational History  . 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 Smoking status: Never Smoker  . Smokeless tobacco: Never Used  Vaping Use  . Vaping Use: Never used  Substance and Sexual Activity  . Alcohol use: Not Currently  . Drug use: Not Currently  . Sexual activity: Not on file  Other Topics Concern  . Not on file  Social History Narrative  . Not on file   Social Determinants of Health   Financial Resource Strain: Not on file  Food Insecurity: Not on file  Transportation Needs: Not on file  Physical Activity: Not on file  Stress: Not on file  Social Connections: Not on file    Review of Systems: Gen: Denies fever, chills, anorexia. Denies fatigue, weakness, weight loss.  CV: Denies chest pain, palpitations, syncope, peripheral edema, and claudication. Resp: Denies dyspnea at rest, cough, wheezing, coughing up blood, and pleurisy. GI: see HPI Derm: Denies rash, itching, dry  skin Psych: Denies depression, anxiety, memory loss, confusion. No homicidal or suicidal ideation.  Heme: Denies bruising, bleeding, and enlarged lymph nodes.  Physical Exam: BP 127/75   Pulse 84   Temp (!) 97.3 F (36.3 C) (Temporal)   Ht 5' 3"  (1.6 m)   Wt 191 lb 9.6 oz (86.9 kg)   BMI 33.94 kg/m  General:   Alert and oriented. No distress noted. Pleasant and cooperative.  Head:  Normocephalic and atraumatic. Eyes:  Conjuctiva clear without scleral icterus. Mouth:  Mask in place Abdomen:  +BS, soft, non-tender and non-distended. No rebound or guarding. No HSM or masses noted. Msk:  Symmetrical without gross deformities. Normal posture. Extremities:  Without edema. Neurologic:  Alert and  oriented x4 Psych:  Alert and cooperative. Normal mood and affect.  ASSESSMENT: ANNAKA CLEAVER is a 75 y.o. female presenting today in follow-up for chronic abdominal pain, nausea, constipation, GERD, anemia multifactorial in setting of chronic disease and IDA.   She has had thorough evaluation thus far with colonoscopy, EGD, and capsule study. Recent Hgb 12.4, and ferritin 207, markedly improved. She has received Feraheme. Continues to follow with Nephrology who has been managing.   Constipation: Linzess dose needs to be titrated up. We will trial Linzess 145 mcg daily. I have sent this to the pharmacy.   GERD: Protonix daily has been helpful. GERD controlled.   Elevated LFTS: mildly elevated AST earlier this year. Will recheck HFP in July. Known fatty liver.    PLAN:  Continue Protonix once daily Linzess 145 mcg sent to pharmacy Repeat HFP in July Return in 6 months  Annitta Needs, PhD, [facility]

## 2020-11-19 ENCOUNTER — Telehealth: Payer: Self-pay

## 2020-11-19 NOTE — Telephone Encounter (Signed)
Pt left message about scheduling surgery.  I advised no surgery order yet.  Will obtain from Dr. Louanne Skye and move forward with scheduling once any clearances are obtained.

## 2020-11-19 NOTE — Telephone Encounter (Signed)
I have started the blue sheet and gave to him to complete.

## 2020-11-22 ENCOUNTER — Ambulatory Visit: Payer: Medicare HMO | Admitting: Specialist

## 2020-11-23 ENCOUNTER — Encounter: Payer: Self-pay | Admitting: Podiatry

## 2020-11-23 ENCOUNTER — Ambulatory Visit (INDEPENDENT_AMBULATORY_CARE_PROVIDER_SITE_OTHER): Payer: Medicare HMO | Admitting: Podiatry

## 2020-11-23 ENCOUNTER — Other Ambulatory Visit: Payer: Self-pay

## 2020-11-23 DIAGNOSIS — W57XXXA Bitten or stung by nonvenomous insect and other nonvenomous arthropods, initial encounter: Secondary | ICD-10-CM | POA: Diagnosis not present

## 2020-11-23 DIAGNOSIS — B351 Tinea unguium: Secondary | ICD-10-CM

## 2020-11-23 DIAGNOSIS — S80862A Insect bite (nonvenomous), left lower leg, initial encounter: Secondary | ICD-10-CM

## 2020-11-23 DIAGNOSIS — M79676 Pain in unspecified toe(s): Secondary | ICD-10-CM | POA: Diagnosis not present

## 2020-11-23 DIAGNOSIS — E1142 Type 2 diabetes mellitus with diabetic polyneuropathy: Secondary | ICD-10-CM | POA: Diagnosis not present

## 2020-11-23 MED ORDER — DOXYCYCLINE HYCLATE 100 MG PO TABS: 100.0000 mg | ORAL_TABLET | Freq: Two times a day (BID) | ORAL | 0 refills | Status: DC

## 2020-11-24 DIAGNOSIS — I10 Essential (primary) hypertension: Secondary | ICD-10-CM | POA: Diagnosis not present

## 2020-11-24 DIAGNOSIS — R350 Frequency of micturition: Secondary | ICD-10-CM | POA: Diagnosis not present

## 2020-11-24 NOTE — Progress Notes (Signed)
She presents today states that she is really having bad pains in her feet and legs.  Dr. Earleen Newport checked her last time for venous insufficiencies and blood clots to which there were none.  She states that she is waiting on back surgery.  States that her legs are getting weaker and she is starting to fall more.  I do have this red spot on my leg  Objective: No changes in physical exam pulses remain palpable  She has a red spot on the anterior aspect of her left leg with a central lesion with erythema expanding from the central area.  It appears to be an insect bite does not appear to have any remaining insect at this time but it does appear to be more of an insect bite than a folliculitis.  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  Insect bite left anterior leg  Plan: Awaiting back surgery.  Started on doxycycline for 10 days.  Follow-up with her in 2 weeks

## 2020-11-24 NOTE — Telephone Encounter (Signed)
This is done, I will bring it down to you

## 2020-11-25 ENCOUNTER — Ambulatory Visit (HOSPITAL_COMMUNITY): Payer: Medicare HMO

## 2020-12-01 NOTE — Progress Notes (Signed)
Cc'ed to pcp °

## 2020-12-02 ENCOUNTER — Encounter (INDEPENDENT_AMBULATORY_CARE_PROVIDER_SITE_OTHER): Payer: Self-pay | Admitting: Nurse Practitioner

## 2020-12-08 ENCOUNTER — Other Ambulatory Visit: Payer: Self-pay

## 2020-12-08 ENCOUNTER — Ambulatory Visit (HOSPITAL_COMMUNITY)
Admission: RE | Admit: 2020-12-08 | Discharge: 2020-12-08 | Disposition: A | Discharge status: Home / Self Care | Payer: Medicare Other | Source: Ambulatory Visit | Attending: Internal Medicine | Admitting: Internal Medicine

## 2020-12-08 DIAGNOSIS — R29898 Other symptoms and signs involving the musculoskeletal system: Secondary | ICD-10-CM | POA: Diagnosis present

## 2020-12-09 ENCOUNTER — Encounter: Payer: Self-pay | Admitting: *Deleted

## 2020-12-09 ENCOUNTER — Other Ambulatory Visit (INDEPENDENT_AMBULATORY_CARE_PROVIDER_SITE_OTHER): Payer: Self-pay | Admitting: Nurse Practitioner

## 2020-12-09 ENCOUNTER — Telehealth (INDEPENDENT_AMBULATORY_CARE_PROVIDER_SITE_OTHER): Payer: Self-pay

## 2020-12-09 ENCOUNTER — Telehealth: Payer: Self-pay

## 2020-12-09 DIAGNOSIS — R9089 Other abnormal findings on diagnostic imaging of central nervous system: Secondary | ICD-10-CM

## 2020-12-09 NOTE — Progress Notes (Signed)
After reviewing patient's case with Dr. Anastasio Champion decision to refer her urgently to neurology was made.  She is awaiting clearance from a medical standpoint for laminectomy L4-5, L5-S1, L5-S1 fusion.  Patient's MRI of the brain that resulted yesterday showed multiple abnormalities which were significant for vascular disease.  We will proceed with urgent referral to neurology to determine if patient can safely undergo general anesthesia from a neurologic standpoint.  Nellie, please work on this referral.  Jinny Blossom, please call patient and notify her of this.  I am faxing the surgical form to her spine specialist as well.  Thank you.

## 2020-12-09 NOTE — Telephone Encounter (Signed)
Called patient and gave her the message from Judson Roch. Please see other telephone encounter for more detail in message.

## 2020-12-09 NOTE — Telephone Encounter (Signed)
I did discuss with Dr. Anastasio Champion this patient's case.  Due to her recent MRI of the brain we are not able to clear her medically for surgery.  Her orthopedic provider wants to schedule her for laminectomy, however Dr. Anastasio Champion and myself feel the patient needs to be referred and evaluated by neurology to make sure that from a neurologic standpoint she can undergo this procedure under general anesthesia.  Referral to be evaluated urgently by neurology has been made.

## 2020-12-09 NOTE — Telephone Encounter (Signed)
Pt called and would like to know the status of scheduling surgery with Dr. Louanne Skye.

## 2020-12-09 NOTE — Telephone Encounter (Signed)
Patient called and left a detailed voice message and stated that Dr. Basil Dess is waiting on her signed clearance form for her to have her surgery done. Patient is getting worse and in a lot of pain. Did you get a clearance form from Dr. Otho Ket office for this patient?

## 2020-12-09 NOTE — Progress Notes (Signed)
Send over the rreferral tonight. Will follow up in morning that they contacted & see referral @ [facility].

## 2020-12-09 NOTE — Telephone Encounter (Signed)
Sending back to you.

## 2020-12-09 NOTE — Progress Notes (Signed)
Called patient and gave her the message. Patient verbalized an understanding and is waiting to hear from someone soon as she stated that after her MRI her back became worse and she is having a really hard time.

## 2020-12-13 NOTE — Telephone Encounter (Signed)
FYI - patient not cleared for surgery by PCP.  Had abnormal brain MRI and they want her to follow up with neurologist first.  Spoke with patient and she is aware.

## 2020-12-14 NOTE — Progress Notes (Signed)
Her symptoms currently do not seem to suggest that things are worsening rapidly so therefore I think it is okay to wait for neurology appointment.  However, if there is further deterioration from now, I will recommend going to the emergency room ASAP.

## 2020-12-15 ENCOUNTER — Other Ambulatory Visit (HOSPITAL_COMMUNITY): Payer: Self-pay | Admitting: Nephrology

## 2020-12-15 ENCOUNTER — Encounter (INDEPENDENT_AMBULATORY_CARE_PROVIDER_SITE_OTHER): Payer: Self-pay | Admitting: Nurse Practitioner

## 2020-12-15 ENCOUNTER — Other Ambulatory Visit: Payer: Self-pay

## 2020-12-15 ENCOUNTER — Ambulatory Visit (INDEPENDENT_AMBULATORY_CARE_PROVIDER_SITE_OTHER): Payer: Medicare Other | Admitting: Nurse Practitioner

## 2020-12-15 VITALS — BP 124/76 | HR 82 | Temp 97.2°F | Ht 62.5 in | Wt 186.8 lb

## 2020-12-15 DIAGNOSIS — R531 Weakness: Secondary | ICD-10-CM

## 2020-12-15 DIAGNOSIS — M7989 Other specified soft tissue disorders: Secondary | ICD-10-CM

## 2020-12-15 DIAGNOSIS — I10 Essential (primary) hypertension: Secondary | ICD-10-CM

## 2020-12-15 DIAGNOSIS — M4807 Spinal stenosis, lumbosacral region: Secondary | ICD-10-CM | POA: Diagnosis not present

## 2020-12-15 NOTE — Progress Notes (Signed)
Subjective:  Patient ID: Stephanie Moreno, female    DOB: 16-Jun-1946  Age: 75 y.o. MRN: [id number]  CC:  Chief Complaint  Patient presents with   Other    Mobility examination and surgical clearance      HPI  This patient arrives today for the above.  She is requesting a powered wheelchair due to significant weakness in her bilateral lower extremities which is secondary to lumbosacral spinal stenosis.  She is awaiting medical clearance to undergo spine surgery with Dr. Louanne Skye.  He is planning on performing two-level lumbar decompression central laminectomy L4-L5 and L5-S1 as well as one level fusion L5-S1.  She has been experiencing progressively worsening leg strength and has experienced a fall within the last month.  She has difficulty with dressing especially with putting on pants or shorts.  She experiences numbness in her lower extremities as well as weakness.  She is experiencing chronic pain in her back which she rates as a 7/10 in intensity as well as leg pain.  Left leg pain intensity is 8/10 and right leg pain intensity is 6/10.  She has a very hard time ambulating and cannot walk more than a few feet without having to sit down and rest due to feelings that her left leg is going to give out on her.  She tells me she cannot stand for more than 5 minutes at a time due to significant pain in her back and legs.  The pain and weakness in her legs have made it difficult for her to cook, ambulate, use the restroom, bathe, and get dressed.  She is unable to navigate stairs currently.  She tells me at home she does not have any stairs inside or outside the home and she feels that her doorways are wide enough to accommodate a wheelchair.  She does have some weakness in her upper extremities as well and does not feel she can easily use a manual wheelchair.  She currently uses a rolling walker with seat, but due to difficulty standing secondary to the pain related to the spinal stenosis she has to  constantly stop walking and sit down to rest.  This makes it challenging for her to accomplish activities of daily living.  She does not feel a cane, her walker, or scooter would provide effective assistance with performing her ADLs.  She is willing and motivated to use a power mobility device in her home.  As far as medical clearance is concerned her surgery is considered fairly high risk as she is undergoing dental synesthesia and this is a back surgery.  She denies any history of ischemic heart disease although mentions that she has had a stress test in the past but tells me she believes this came back negative.  She does not have a history of heart failure, however she is undergoing investigation for this because she has been experiencing quite a bit of bilateral leg swelling.  Per her previous doctor she has had echocardiogram for evaluation of lower leg extremity swelling about a year ago.  At that time echocardiogram showed EF of 60 to 65%.  She continues to tell me she only experiences shortness of breath with exertion and this may be related to her pain with her back and legs.  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  She also has diabetes that requires treatment with insulin.  She has chronic kidney disease and last serum  creatinine was 1.42 with estimated GFR of 42.  She does follow with nephrology.  As far as her functional capacity is concerned, prior to the leg weakness she reports that she was able to complete ADLs, walk indoors, walk on level ground, and do light and moderate housework..  Thus her METs score is approximately 4.4.  Past Medical History:  Diagnosis Date   Acid reflux disease    Anemia    Breast cancer ([place])    left side   Chronic back pain    Chronic kidney disease    Diabetes mellitus without complication ([place])    Diverticulitis    Hypertension    Lumbosacral spondylosis    Per previous PCP records   Sleep apnea     Thyroid disease       Family History  Problem Relation Age of Onset   Diabetes Mother    Thyroid disease Mother    Colon cancer Neg Hx     Social History   Social History Narrative   Not on file   Social History   Tobacco Use   Smoking status: Never   Smokeless tobacco: Never  Substance Use Topics   Alcohol use: Not Currently     Current Meds  Medication Sig   acetaminophen (TYLENOL) 500 MG tablet Take 1,000 mg by mouth as needed for moderate pain.    amitriptyline (ELAVIL) 50 MG tablet Take 50 mg by mouth at bedtime.   amLODipine (NORVASC) 10 MG tablet Take 10 mg by mouth daily.   aspirin EC 81 MG tablet Take 81 mg by mouth every morning. Swallow whole.   beclomethasone (QVAR) 80 MCG/ACT inhaler Inhale 1 puff into the lungs 2 (two) times daily as needed (shortness of breath).   Blood Glucose Monitoring Suppl (ACCU-CHEK GUIDE ME) w/Device KIT 1 Piece by Does not apply route as directed.   cyclobenzaprine (FLEXERIL) 10 MG tablet Take 10 mg by mouth 3 (three) times daily as needed.   Dulaglutide (TRULICITY) 1.5 ZO/1.0RU SOPN Inject 1.5 mg into the skin every Monday.   furosemide (LASIX) 40 MG tablet Take 40 mg by mouth daily.   gabapentin (NEURONTIN) 300 MG capsule Take 1 capsule by mouth at bedtime.   GEMTESA 75 MG TABS Take 1 tablet by mouth daily.   glipiZIDE (GLUCOTROL XL) 5 MG 24 hr tablet Take 1 tablet (5 mg total) by mouth daily with breakfast.   GLUCOSAMINE-CHONDROITIN PO Take 1 tablet by mouth daily.   glucose blood (ACCU-CHEK GUIDE) test strip Use as instructed   HYDROcodone-acetaminophen (NORCO/VICODIN) 5-325 MG tablet Take 1 tablet by mouth every 6 (six) hours as needed for moderate pain.   insulin degludec (TRESIBA FLEXTOUCH) 100 UNIT/ML FlexTouch Pen Inject 35 Units into the skin daily. (Patient taking differently: Inject 34 Units into the skin daily.)   Insulin Pen Needle (B-D ULTRAFINE III SHORT PEN) 31G X 8 MM MISC 1 each by Does not apply route as  directed.   Lancets (ONETOUCH DELICA PLUS [id number]) MISC Apply topically 3 (three) times daily.   lidocaine (LIDODERM) 5 % Place 1 patch onto the skin daily as needed (pain). Remove & Discard patch within 12 hours or as directed by MD. Applied to lower back/shoulders/neck for pain   linaclotide (LINZESS) 145 MCG CAPS capsule Take 1 capsule (145 mcg total) by mouth daily before breakfast.   losartan (COZAAR) 50 MG tablet Take 100 mg by mouth daily.   magnesium oxide (MAG-OX) 400 MG tablet Take 400 mg by mouth  daily.   meclizine (ANTIVERT) 25 MG tablet Take 25 mg by mouth 3 (three) times daily as needed for dizziness.   metoprolol tartrate (LOPRESSOR) 50 MG tablet Take 50 mg by mouth 2 (two) times daily.   Naphazoline HCl (CLEAR EYES OP) Place 1 drop into both eyes daily as needed (dry eyes).   OYSTER SHELL CALCIUM PLUS D 500-200 MG-UNIT TABS Take 1 tablet by mouth 3 (three) times daily.   pantoprazole (PROTONIX) 40 MG tablet Take 1 tablet (40 mg total) by mouth daily. 30 minutes before breakfast   phenazopyridine (PYRIDIUM) 200 MG tablet Take 1 tablet (200 mg total) by mouth 3 (three) times daily.   polyethylene glycol (MIRALAX / GLYCOLAX) 17 g packet Take 17 g by mouth daily as needed for moderate constipation.   rosuvastatin (CRESTOR) 10 MG tablet Take 10 mg by mouth at bedtime.    sucralfate (CARAFATE) 1 GM/10ML suspension Take 1.5 g by mouth 3 (three) times daily as needed (indigestion).    verapamil (CALAN-SR) 180 MG CR tablet Take 180 mg by mouth daily.    ROS:  Review of Systems  Eyes:  Positive for blurred vision.  Respiratory:  Negative for cough and shortness of breath.   Cardiovascular:  Positive for leg swelling. Negative for chest pain, palpitations and orthopnea.  Neurological:  Positive for weakness. Negative for sensory change.    Objective:   Today's Vitals: BP 124/76   Pulse 82   Temp (!) 97.2 F (36.2 C) (Temporal)   Ht 5' 2.5" (1.588 m)   Wt 186 lb 12.8 oz (84.7  kg)   SpO2 97%   BMI 33.62 kg/m  Vitals with BMI 12/15/2020 11/18/2020 11/11/2020  Height 5' 2.5" 5' 3"  5' 2.5"  Weight 186 lbs 13 oz 191 lbs 10 oz 195 lbs  BMI 33.6 34.19 37.90  Systolic 240 973 532  Diastolic 76 75 82  Pulse 82 84 84     Physical Exam Vitals reviewed.  Constitutional:      General: She is not in acute distress.    Appearance: Normal appearance.  HENT:     Head: Normocephalic and atraumatic.  Neck:     Vascular: No carotid bruit.  Cardiovascular:     Rate and Rhythm: Normal rate and regular rhythm.     Pulses: Normal pulses.     Heart sounds: Normal heart sounds.  Pulmonary:     Effort: Pulmonary effort is normal.     Breath sounds: Normal breath sounds.  Musculoskeletal:     Right lower leg: 1+ Pitting Edema present.     Left lower leg: 1+ Pitting Edema present.     Comments: Reduced range of motion to right and left hip, mild reduced range of motion to left knee  Skin:    General: Skin is warm and dry.  Neurological:     General: No focal deficit present.     Mental Status: She is alert and oriented to person, place, and time.     Cranial Nerves: No cranial nerve deficit.     Sensory: Sensory deficit (reduced sensation to light touch on posterios aspectof bilateral lower extremities) present.     Motor: Weakness (4/5 strength in bilateral lower extremities (L slight worse than R); slight weakness (4/5) to bilateral upper extremities) present. No pronator drift.     Gait: Gait abnormal (ataxic and shuffling).     Deep Tendon Reflexes:     Reflex Scores:      Bicep reflexes are  2+ on the right side and 2+ on the left side.      Patellar reflexes are 2+ on the right side and 1+ on the left side. Psychiatric:        Mood and Affect: Mood normal.        Behavior: Behavior normal.        Judgment: Judgment normal.    EKG: Sinus rhythm without Q waves or ST-Segment changes     Assessment and Plan   1. Spinal stenosis of lumbosacral region   2. Leg  swelling   3. Weakness      Plan: 1.-3.  I do think she would be a candidate to utilize powered wheelchair for assistance with her ADLs and improve safety in the home specifically aimed at avoiding falls.  I also feel that she would be able to safely use a power wheelchair.  I think she would benefit from a home health aide to also assist her with her ADLs and she does need a shower chair so I will order these today.  I will also sign off an order for the power wheelchair.  As far as medical clearance is concerned I have discussed this extensively with the patient as well as with Dr. Anastasio Champion.  She is awaiting to be evaluated by neurology considering that she had an abnormal MRI of the brain.  I do feel that she would be a high risk candidate for surgery, however I have discussed this with the patient and she is aware that being a high risk candidate may include higher risk of her experiencing poor outcomes related to the surgery which may include cardiac death, MI, stroke, ventricle fibrillation, and/or heart block.  I do feel that if she does not go through the surgery her weakness very well could get progressively worse and may be permanent.  She would like to still undergo surgery despite knowing the risk of being high risk candidate.  Dr. Anastasio Champion is also in agreements that she is a high risk candidate but feels the benefits may outweigh the risk based on her significant weakness and difficulty to perform ADLs.  She will await evaluation with neurology before being fully cleared to have surgery medically.   Tests ordered Orders Placed This Encounter  Procedures   For home use only DME Other see comment   Ambulatory referral to [facility]   EKG 12-Lead      No orders of the defined types were placed in this encounter.   Patient to follow-up in 3 months or sooner as needed. I spent 63 minutes dedicated to the care of this patient on the date of this encounter which includes a combination of  either face-to-face or virtual contact with the patient, review of  previous results/testing, and ordering of tests and/or procedures.   Ailene Ards, NP

## 2020-12-16 ENCOUNTER — Telehealth (INDEPENDENT_AMBULATORY_CARE_PROVIDER_SITE_OTHER): Payer: Self-pay | Admitting: Nurse Practitioner

## 2020-12-16 ENCOUNTER — Telehealth (INDEPENDENT_AMBULATORY_CARE_PROVIDER_SITE_OTHER): Payer: Self-pay

## 2020-12-16 ENCOUNTER — Ambulatory Visit: Payer: Medicare Other | Admitting: Neurology

## 2020-12-16 NOTE — Telephone Encounter (Signed)
Received completed paperwork for the [facility] [employer] and added the last office notes and faxed to [phone] after calling and speaking to [place] at [phone] to confirm that I have all the information required to fax. Fax confirmation received also.

## 2020-12-16 NOTE — Telephone Encounter (Signed)
Please call patient ask her if she be willing to undergo screening mammogram.  I have a note that she is due for this, if she is agreeable to this I will order the mammogram.

## 2020-12-20 ENCOUNTER — Other Ambulatory Visit: Payer: Self-pay

## 2020-12-20 ENCOUNTER — Ambulatory Visit (HOSPITAL_COMMUNITY)
Admission: RE | Admit: 2020-12-20 | Discharge: 2020-12-20 | Disposition: A | Discharge status: Home / Self Care | Payer: Medicare Other | Source: Ambulatory Visit | Attending: Nephrology | Admitting: Nephrology

## 2020-12-20 ENCOUNTER — Ambulatory Visit (INDEPENDENT_AMBULATORY_CARE_PROVIDER_SITE_OTHER): Payer: Medicare Other | Admitting: Neurology

## 2020-12-20 ENCOUNTER — Encounter: Payer: Self-pay | Admitting: Neurology

## 2020-12-20 VITALS — BP 142/76 | HR 77 | Ht 62.0 in | Wt 190.0 lb

## 2020-12-20 DIAGNOSIS — I10 Essential (primary) hypertension: Secondary | ICD-10-CM | POA: Diagnosis present

## 2020-12-20 DIAGNOSIS — I679 Cerebrovascular disease, unspecified: Secondary | ICD-10-CM | POA: Diagnosis not present

## 2020-12-20 LAB — ECHOCARDIOGRAM COMPLETE
AR max vel: 3.04 cm2
AV Area VTI: 2.62 cm2
AV Area mean vel: 2.54 cm2
AV Mean grad: 3 mmHg
AV Peak grad: 4.9 mmHg
Ao pk vel: 1.11 m/s
Area-P 1/2: 2.6 cm2
Height: 62 in
S' Lateral: 2.52 cm
Weight: 3040 oz
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
## 2020-12-20 NOTE — Patient Instructions (Signed)
Recommend Carotid Dopplers  Stroke Prevention Some medical conditions and behaviors are associated with a higher chance of having a stroke. You can help prevent a stroke by making nutrition, lifestyle,and other changes, including managing any medical conditions you may have. What nutrition changes can be made?  Eat healthy foods. You can do this by: Choosing foods high in fiber, such as fresh fruits and vegetables and whole grains. Eating at least 5 or more servings of fruits and vegetables a day. Try to fill half of your plate at each meal with fruits and vegetables. Choosing lean protein foods, such as lean cuts of meat, poultry without skin, fish, tofu, beans, and nuts. Eating low-fat dairy products. Avoiding foods that are high in salt (sodium). This can help lower blood pressure. Avoiding foods that have saturated fat, trans fat, and cholesterol. This can help prevent high cholesterol. Avoiding processed and premade foods. Follow your health care provider's specific guidelines for losing weight, controlling high blood pressure (hypertension), lowering high cholesterol, and managing diabetes. These may include: Reducing your daily calorie intake. Limiting your daily sodium intake to 1,500 milligrams (mg). Using only healthy fats for cooking, such as olive oil, canola oil, or sunflower oil. Counting your daily carbohydrate intake. What lifestyle changes can be made? Maintain a healthy weight. Talk to your health care provider about your ideal weight. Get at least 30 minutes of moderate physical activity at least 5 days a week. Moderate activity includes brisk walking, biking, and swimming. Do not use any products that contain nicotine or tobacco, such as cigarettes and e-cigarettes. If you need help quitting, ask your health care provider. It may also be helpful to avoid exposure to secondhand smoke. Limit alcohol intake to no more than 1 drink a day for nonpregnant women and 2 drinks a day  for men. One drink equals 12 oz of beer, 5 oz of wine, or 1 oz of hard liquor. Stop any illegal drug use. Avoid taking birth control pills. Talk to your health care provider about the risks of taking birth control pills if: You are over 89 years old. You smoke. You get migraines. You have ever had a blood clot. What other changes can be made? Manage your cholesterol levels. Eating a healthy diet is important for preventing high cholesterol. If cholesterol cannot be managed through diet alone, you may also need to take medicines. Take any prescribed medicines to control your cholesterol as told by your health care provider. Manage your diabetes. Eating a healthy diet and exercising regularly are important parts of managing your blood sugar. If your blood sugar cannot be managed through diet and exercise, you may need to take medicines. Take any prescribed medicines to control your diabetes as told by your health care provider. Control your hypertension. To reduce your risk of stroke, try to keep your blood pressure below 130/80. Eating a healthy diet and exercising regularly are an important part of controlling your blood pressure. If your blood pressure cannot be managed through diet and exercise, you may need to take medicines. Take any prescribed medicines to control hypertension as told by your health care provider. Ask your health care provider if you should monitor your blood pressure at home. Have your blood pressure checked every year, even if your blood pressure is normal. Blood pressure increases with age and some medical conditions. Get evaluated for sleep disorders (sleep apnea). Talk to your health care provider about getting a sleep evaluation if you snore a lot or have excessive  sleepiness. Take over-the-counter and prescription medicines only as told by your health care provider. Aspirin or blood thinners (antiplatelets or anticoagulants) may be recommended to reduce your risk of  forming blood clots that can lead to stroke. Make sure that any other medical conditions you have, such as atrial fibrillation or atherosclerosis, are managed. What are the warning signs of a stroke? The warning signs of a stroke can be easily remembered as BEFAST. B is for balance. Signs include: Dizziness. Loss of balance or coordination. Sudden trouble walking. E is for eyes. Signs include: A sudden change in vision. Trouble seeing. F is for face. Signs include: Sudden weakness or numbness of the face. The face or eyelid drooping to one side. A is for arms. Signs include: Sudden weakness or numbness of the arm, usually on one side of the body. S is for speech. Signs include: Trouble speaking (aphasia). Trouble understanding. T is for time. These symptoms may represent a serious problem that is an emergency. Do not wait to see if the symptoms will go away. Get medical help right away. Call your local emergency services (911 in the [place] Do not drive yourself to the hospital. Other signs of stroke may include: A sudden, severe headache with no known cause. Nausea or vomiting. Seizure. Where to find more information For more information, visit: American Stroke Association: www.strokeassociation.org National Stroke Association: www.stroke.org Summary You can prevent a stroke by eating healthy, exercising, not smoking, limiting alcohol intake, and managing any medical conditions you may have. Do not use any products that contain nicotine or tobacco, such as cigarettes and e-cigarettes. If you need help quitting, ask your health care provider. It may also be helpful to avoid exposure to secondhand smoke. Remember BEFAST for warning signs of stroke. Get help right away if you or a loved one has any of these signs. This information is not intended to replace advice given to you by your health care provider. Make sure you discuss any questions you have with your healthcare  provider. Document Revised: 06/01/2017 Document Reviewed: 07/25/2016 Elsevier Patient Education  2021 [employer].

## 2020-12-20 NOTE — Telephone Encounter (Signed)
Called patient and gave her the message and she did agree to have her mammogram done.

## 2020-12-20 NOTE — Addendum Note (Signed)
Addended by: Sarina Ill B on: 12/20/2020 10:02 AM   Modules accepted: Orders

## 2020-12-20 NOTE — Telephone Encounter (Signed)
Called patient and it was ringing for several minutes and there does not seem to be an answering machine. I will try patient again at a later time.

## 2020-12-21 ENCOUNTER — Other Ambulatory Visit (INDEPENDENT_AMBULATORY_CARE_PROVIDER_SITE_OTHER): Payer: Self-pay | Admitting: Nurse Practitioner

## 2020-12-21 ENCOUNTER — Telehealth (INDEPENDENT_AMBULATORY_CARE_PROVIDER_SITE_OTHER): Payer: Self-pay | Admitting: Nurse Practitioner

## 2020-12-21 DIAGNOSIS — Z1231 Encounter for screening mammogram for malignant neoplasm of breast: Secondary | ICD-10-CM

## 2020-12-21 NOTE — Telephone Encounter (Signed)
Called [facility] at [phone] and was transferred to [facility], the surgical scheduler for Dr. Louanne Skye and left a voice message for her to fax [place] the surgical clearance paperwork.

## 2020-12-21 NOTE — Progress Notes (Signed)
Is this need now or later. The surgery is going to to be 1st ?

## 2020-12-21 NOTE — Telephone Encounter (Signed)
Please call [facility] in [place] (part of [facility]) and ask for surgical clearance paperwork for this patient to be faxed to our office today. Then once it arrives please give it to Dr. Anastasio Champion so that he can sign and we can fax it back . She has now been seen by neurology and has been cleared by them from a neurologic standpoint to undergo surgery. Thus, Dr. Anastasio Champion can sign off from a medical standpoint as well. The patient is supposed to have procedure done with Dr. Flavia Shipper.

## 2020-12-21 NOTE — Progress Notes (Signed)
Ordering screening mammogram for this patient.

## 2020-12-22 NOTE — Progress Notes (Signed)
Okay, lets wait until she is back to see you or after recovery.

## 2020-12-23 ENCOUNTER — Telehealth: Payer: Self-pay

## 2020-12-23 NOTE — Telephone Encounter (Signed)
Did we ever get the paperwork?

## 2020-12-23 NOTE — Telephone Encounter (Signed)
Megan from [facility] called she is requesting a medical clarence from to be faxed over fax:[phone] call [phone]

## 2020-12-23 NOTE — Telephone Encounter (Signed)
Called them again and spoke to [place] and she is going to send a message to [facility] to fax [place] the surgical clearance paperwork. I have not seen this yet.

## 2020-12-24 NOTE — Telephone Encounter (Signed)
Faxed

## 2020-12-29 NOTE — Telephone Encounter (Signed)
I did see a form and I did say that she was medically cleared to have surgery.  Santiago Glad or Nellie should know about it.

## 2020-12-29 NOTE — Telephone Encounter (Signed)
I think Stephanie Moreno may have faxed already. Only thing I have currently is her Hoveround mobile chair.

## 2020-12-30 ENCOUNTER — Telehealth (INDEPENDENT_AMBULATORY_CARE_PROVIDER_SITE_OTHER): Payer: Self-pay

## 2020-12-30 LAB — HM DIABETES EYE EXAM

## 2020-12-30 NOTE — Telephone Encounter (Signed)
Martha/or [name] called from [facility] and stated that the patient had a change in insurance and the faxed over a new form for Stephanie Moreno to sign. Telephone number [phone] Fax [phone]  Have you received this form?

## 2020-12-30 NOTE — Telephone Encounter (Signed)
I did receive a fax yesterday, but I am not sure if it was a new form or not.  I did sign it and fax it back.  We may want to ask her to just fax whichever form she needs now again just in case.

## 2021-01-06 ENCOUNTER — Other Ambulatory Visit (HOSPITAL_BASED_OUTPATIENT_CLINIC_OR_DEPARTMENT_OTHER): Payer: Self-pay

## 2021-01-06 ENCOUNTER — Encounter: Payer: Self-pay | Admitting: Surgery

## 2021-01-06 ENCOUNTER — Ambulatory Visit (INDEPENDENT_AMBULATORY_CARE_PROVIDER_SITE_OTHER): Payer: Medicare Other | Admitting: Surgery

## 2021-01-06 VITALS — BP 148/77 | HR 73 | Ht 62.0 in | Wt 190.0 lb

## 2021-01-06 DIAGNOSIS — R5383 Other fatigue: Secondary | ICD-10-CM

## 2021-01-06 DIAGNOSIS — M48062 Spinal stenosis, lumbar region with neurogenic claudication: Secondary | ICD-10-CM

## 2021-01-06 NOTE — Progress Notes (Signed)
75 year old black female history of L4-5 and L5-S1 stenosis comes in for preop evaluation.  States that symptoms unchanged from previous visit.  She is wanting to proceed with CENTRAL LAMINECTOMY L4-5 AND L5-S1, TRANSFORAMINAL LUMBAR INTERBODY FUSION LEFT L5-S1 WITH GLOBUS PEDICLE SCREWS, RODS AND SABLE CAGE, LOCAL BONE GRAFT, ALLOGRAFT BONE GRAFT.  We received preop medical clearance.  Today history and physical performed.  Review of systems negative.  Patient does have a history of sleep apnea with previous CPAP use before moving to [place] about a year ago.  States that she discontinued this because the mask was cutting into her face and she cannot tolerate this.  States that she is scheduled for a sleep study next month.  Plan We will proceed with surgery as scheduled.  Surgery procedure discussed along with potential rehab/recovery time.  Patient does not have much help at home and may need skilled nurse facility placement postop for rehab.  Patient states that Dr. Louanne Skye had mentioned writing a prescription for in-home care immediately postop.  Patient was advised that the social workers will also discuss this during her hospital admission.

## 2021-01-06 NOTE — Pre-Procedure Instructions (Signed)
Surgical Instructions    Your procedure is scheduled on Tuesday, July 12th.  Report to [facility] Main Entrance "A" at 5:30 A.M., then check in with the Admitting office.  Call this number if you have problems the morning of surgery:  [phone]   If you have any questions prior to your surgery date call [phone]: Open Monday-Friday 8am-4pm    Remember:  Do not eat after midnight the night before your surgery  You may drink clear liquids until 4:30 a.m. the morning of your surgery.   Clear liquids allowed are: Water, Non-Citrus Juices (without pulp), Carbonated Beverages, Clear Tea, Black Coffee Only, and Gatorade. (Please choose sugar-free/diet options)    Take these medicines the morning of surgery with A SIP OF WATER  amLODipine (NORVASC)  beclomethasone (QVAR) linaclotide (LINZESS)  metoprolol tartrate (LOPRESSOR)  pantoprazole (PROTONIX)  GEMTESA  phenazopyridine (PYRIDIUM)    Take these medications as needed acetaminophen (TYLENOL)  cyclobenzaprine (FLEXERIL) Flovent inhaler-please bring with you on the day of surgery. meclizine (ANTIVERT)  Clear eyes  Follow your surgeon's instructions on when to stop Aspirin.  If no instructions were given by your surgeon then you will need to call the office to get those instructions.    As of today, STOP taking any Aleve, Naproxen, Ibuprofen, Motrin, Advil, Goody's, BC's, all herbal medications, fish oil, and all vitamins.          WHAT DO I DO ABOUT MY DIABETES MEDICATION?   Do not take glipiZIDE (GLUCOTROL XL) the morning of surgery.  THE MORNING OF SURGERY, take 17 units of Insulin Degludec (TRESIBA) insulin.  The day of surgery, do not take other diabetes injectables, Trulicity (dulaglutide).   HOW TO MANAGE YOUR DIABETES BEFORE AND AFTER SURGERY  Why is it important to control my blood sugar before and after surgery? Improving blood sugar levels before and after surgery helps healing and can limit problems. A  way of improving blood sugar control is eating a healthy diet by:  Eating less sugar and carbohydrates  Increasing activity/exercise  Talking with your doctor about reaching your blood sugar goals High blood sugars (greater than 180 mg/dL) can raise your risk of infections and slow your recovery, so you will need to focus on controlling your diabetes during the weeks before surgery. Make sure that the doctor who takes care of your diabetes knows about your planned surgery including the date and location.  How do I manage my blood sugar before surgery? Check your blood sugar at least 4 times a day, starting 2 days before surgery, to make sure that the level is not too high or low.  Check your blood sugar the morning of your surgery when you wake up and every 2 hours until you get to the Short Stay unit.  If your blood sugar is less than 70 mg/dL, you will need to treat for low blood sugar: Do not take insulin. Treat a low blood sugar (less than 70 mg/dL) with  cup of clear juice (cranberry or apple), 4 glucose tablets, OR glucose gel. Recheck blood sugar in 15 minutes after treatment (to make sure it is greater than 70 mg/dL). If your blood sugar is not greater than 70 mg/dL on recheck, call [phone] for further instructions. Report your blood sugar to the short stay nurse when you get to Short Stay.  If you are admitted to the hospital after surgery: Your blood sugar will be checked by the staff and you will probably be given insulin after  surgery (instead of oral diabetes medicines) to make sure you have good blood sugar levels. The goal for blood sugar control after surgery is 80-180 mg/dL.             Do NOT Smoke (Tobacco/Vaping) or drink Alcohol 24 hours prior to your procedure.  If you use a CPAP at night, you may bring all equipment for your overnight stay.   Contacts, glasses, piercing's, hearing aid's, dentures or partials may not be worn into surgery, please bring cases for  these belongings.    For patients admitted to the hospital, discharge time will be determined by your treatment team.   Patients discharged the day of surgery will not be allowed to drive home, and someone needs to stay with them for 24 hours.  ONLY 1 SUPPORT PERSON MAY BE PRESENT WHILE YOU ARE IN SURGERY. IF YOU ARE TO BE ADMITTED ONCE YOU ARE IN YOUR ROOM YOU WILL BE ALLOWED TWO (2) VISITORS.  Minor children may have two parents present. Special consideration for safety and communication needs will be reviewed on a case by case basis.   Special instructions:   [place]- Preparing For Surgery  Before surgery, you can play an important role. Because skin is not sterile, your skin needs to be as free of germs as possible. You can reduce the number of germs on your skin by washing with CHG (chlorahexidine gluconate) Soap before surgery.  CHG is an antiseptic cleaner which kills germs and bonds with the skin to continue killing germs even after washing.    Oral Hygiene is also important to reduce your risk of infection.  Remember - BRUSH YOUR TEETH THE MORNING OF SURGERY WITH YOUR REGULAR TOOTHPASTE  Please do not use if you have an allergy to CHG or antibacterial soaps. If your skin becomes reddened/irritated stop using the CHG.  Do not shave (including legs and underarms) for at least 48 hours prior to first CHG shower. It is OK to shave your face.  Please follow these instructions carefully.   Shower the NIGHT BEFORE SURGERY and the MORNING OF SURGERY  If you chose to wash your hair, wash your hair first as usual with your normal shampoo.  After you shampoo, rinse your hair and body thoroughly to remove the shampoo.  Use CHG Soap as you would any other liquid soap. You can apply CHG directly to the skin and wash gently with a scrungie or a clean washcloth.   Apply the CHG Soap to your body ONLY FROM THE NECK DOWN.  Do not use on open wounds or open sores. Avoid contact with your eyes,  ears, mouth and genitals (private parts). Wash Face and genitals (private parts)  with your normal soap.   Wash thoroughly, paying special attention to the area where your surgery will be performed.  Thoroughly rinse your body with warm water from the neck down.  DO NOT shower/wash with your normal soap after using and rinsing off the CHG Soap.  Pat yourself dry with a CLEAN TOWEL.  Wear CLEAN PAJAMAS to bed the night before surgery  Place CLEAN SHEETS on your bed the night before your surgery  DO NOT SLEEP WITH PETS.   Day of Surgery: Shower with CHG soap. Do not wear jewelry, make up, nail polish, gel polish, artificial nails, or any other type of covering on natural nails including finger and toenails. If patients have artificial nails, gel coating, etc. that need to be removed by a nail salon  please have this removed prior to surgery. Surgery may need to be canceled/delayed if the surgeon/ anesthesia feels like the patient is unable to be adequately monitored. Do not wear lotions, powders, perfumes, or deodorant. Do not shave 48 hours prior to surgery.  Do not bring valuables to the hospital. [facility] is not responsible for any belongings or valuables. Wear Clean/Comfortable clothing the morning of surgery Remember to brush your teeth WITH YOUR REGULAR TOOTHPASTE.   Please read over the following fact sheets that you were given.

## 2021-01-07 ENCOUNTER — Other Ambulatory Visit: Payer: Self-pay

## 2021-01-07 ENCOUNTER — Inpatient Hospital Stay (HOSPITAL_COMMUNITY)
Admission: RE | Admit: 2021-01-07 | Discharge: 2021-01-07 | Disposition: A | Discharge status: Home / Self Care | Payer: Medicare Other | Source: Ambulatory Visit

## 2021-01-10 ENCOUNTER — Encounter (HOSPITAL_COMMUNITY): Payer: Self-pay | Admitting: Physician Assistant

## 2021-01-10 ENCOUNTER — Other Ambulatory Visit: Payer: Self-pay

## 2021-01-10 ENCOUNTER — Telehealth (INDEPENDENT_AMBULATORY_CARE_PROVIDER_SITE_OTHER): Payer: Self-pay

## 2021-01-10 ENCOUNTER — Encounter (HOSPITAL_COMMUNITY): Payer: Self-pay

## 2021-01-10 ENCOUNTER — Encounter (HOSPITAL_COMMUNITY)
Admission: RE | Admit: 2021-01-10 | Discharge: 2021-01-10 | Disposition: A | Discharge status: Home / Self Care | Payer: Medicare Other | Source: Ambulatory Visit | Attending: Specialist | Admitting: Specialist

## 2021-01-10 DIAGNOSIS — Z01812 Encounter for preprocedural laboratory examination: Secondary | ICD-10-CM | POA: Insufficient documentation

## 2021-01-10 DIAGNOSIS — Z20822 Contact with and (suspected) exposure to covid-19: Secondary | ICD-10-CM | POA: Diagnosis not present

## 2021-01-10 LAB — BASIC METABOLIC PANEL
Anion gap: 8 (ref 5–15)
BUN: 22 mg/dL (ref 8–23)
CO2: 23 mmol/L (ref 22–32)
Calcium: 9.8 mg/dL (ref 8.9–10.3)
Chloride: 104 mmol/L (ref 98–111)
Creatinine, Ser: 1.4 mg/dL — ABNORMAL HIGH (ref 0.44–1.00)
GFR, Estimated: 39 mL/min — ABNORMAL LOW (ref >60)
Glucose, Bld: 158 mg/dL — ABNORMAL HIGH (ref 70–99)
Potassium: 4.6 mmol/L (ref 3.5–5.1)
Sodium: 135 mmol/L (ref 135–145)

## 2021-01-10 LAB — HEMOGLOBIN A1C
Hgb A1c MFr Bld: 8.2 % — ABNORMAL HIGH (ref 4.8–5.6)
Mean Plasma Glucose: 188.64 mg/dL

## 2021-01-10 LAB — CBC
HCT: 44 % (ref 36.0–46.0)
Hemoglobin: 14 g/dL (ref 12.0–15.0)
MCH: 27.6 pg (ref 26.0–34.0)
MCHC: 31.8 g/dL (ref 30.0–36.0)
MCV: 86.6 fL (ref 80.0–100.0)
Platelets: 296 10*3/uL (ref 150–400)
RBC: 5.08 MIL/uL (ref 3.87–5.11)
RDW: 14.5 % (ref 11.5–15.5)
WBC: 8.1 10*3/uL (ref 4.0–10.5)
nRBC: 0 % (ref 0.0–0.2)

## 2021-01-10 LAB — SURGICAL PCR SCREEN
MRSA, PCR: NEGATIVE
Staphylococcus aureus: NEGATIVE

## 2021-01-10 LAB — GLUCOSE, CAPILLARY: Glucose-Capillary: 154 mg/dL — ABNORMAL HIGH (ref 70–99)

## 2021-01-10 LAB — TYPE AND SCREEN
ABO/RH(D): O POS
Antibody Screen: NEGATIVE

## 2021-01-10 NOTE — Anesthesia Preprocedure Evaluation (Deleted)
Anesthesia Evaluation    Airway        Dental   Pulmonary           Cardiovascular hypertension,      Neuro/Psych    GI/Hepatic   Endo/Other  diabetes  Renal/GU      Musculoskeletal   Abdominal   Peds  Hematology   Anesthesia Other Findings   Reproductive/Obstetrics                             Anesthesia Physical Anesthesia Plan  ASA:   Anesthesia Plan:    Post-op Pain Management:    Induction:   PONV Risk Score and Plan:   Airway Management Planned:   Additional Equipment:   Intra-op Plan:   Post-operative Plan:   Informed Consent:   Plan Discussed with:   Anesthesia Plan Comments: (Medical clearance on chart from PCP Dr. Hurshel Party dated 12/27/20.  TTE 12/20/20: 1. Left ventricular ejection fraction, by estimation, is 65 to 70%. The  left ventricle has normal function. The left ventricle has no regional  wall motion abnormalities. Left ventricular diastolic parameters are  indeterminate.  2. Right ventricular systolic function is normal. The right ventricular  size is normal.  3. Trivial mitral valve regurgitation.  4. The aortic valve is normal in structure. Aortic valve regurgitation is  not visualized.   )        Anesthesia Quick Evaluation

## 2021-01-10 NOTE — Pre-Procedure Instructions (Signed)
Surgical Instructions    Your procedure is scheduled on Tuesday, July 12th.  Report to [facility] Main Entrance "A" at 5:30 A.M., then check in with the Admitting office.  Call this number if you have problems the morning of surgery:  [phone]   If you have any questions prior to your surgery date call [phone]: Open Monday-Friday 8am-4pm    Remember:  Do not eat or drink after midnight the night before your surgery     Take these medicines the morning of surgery with A SIP OF WATER  amLODipine (NORVASC)  beclomethasone (QVAR) linaclotide (LINZESS)  metoprolol tartrate (LOPRESSOR)  pantoprazole (PROTONIX)  GEMTESA  phenazopyridine (PYRIDIUM)    Take these medications as needed acetaminophen (TYLENOL)  cyclobenzaprine (FLEXERIL) Flovent inhaler-please bring with you on the day of surgery. meclizine (ANTIVERT)  Clear eyes  Follow your surgeon's instructions on when to stop Aspirin.  If no instructions were given by your surgeon then you will need to call the office to get those instructions.    As of today, STOP taking any Aleve, Naproxen, Ibuprofen, Motrin, Advil, Goody's, BC's, all herbal medications, fish oil, and all vitamins.          WHAT DO I DO ABOUT MY DIABETES MEDICATION?   Do not take glipiZIDE (GLUCOTROL XL) the morning of surgery.  THE MORNING OF SURGERY, take 17 units of Insulin Degludec (TRESIBA) insulin.  The day of surgery, do not take other diabetes injectables, Trulicity (dulaglutide).   HOW TO MANAGE YOUR DIABETES BEFORE AND AFTER SURGERY  Why is it important to control my blood sugar before and after surgery? Improving blood sugar levels before and after surgery helps healing and can limit problems. A way of improving blood sugar control is eating a healthy diet by:  Eating less sugar and carbohydrates  Increasing activity/exercise  Talking with your doctor about reaching your blood sugar goals High blood sugars (greater than 180 mg/dL)  can raise your risk of infections and slow your recovery, so you will need to focus on controlling your diabetes during the weeks before surgery. Make sure that the doctor who takes care of your diabetes knows about your planned surgery including the date and location.  How do I manage my blood sugar before surgery? Check your blood sugar at least 4 times a day, starting 2 days before surgery, to make sure that the level is not too high or low.  Check your blood sugar the morning of your surgery when you wake up and every 2 hours until you get to the Short Stay unit.  If your blood sugar is less than 70 mg/dL, you will need to treat for low blood sugar: Do not take insulin. Treat a low blood sugar (less than 70 mg/dL) with  cup of clear juice (cranberry or apple), 4 glucose tablets, OR glucose gel. Recheck blood sugar in 15 minutes after treatment (to make sure it is greater than 70 mg/dL). If your blood sugar is not greater than 70 mg/dL on recheck, call [phone] for further instructions. Report your blood sugar to the short stay nurse when you get to Short Stay.  If you are admitted to the hospital after surgery: Your blood sugar will be checked by the staff and you will probably be given insulin after surgery (instead of oral diabetes medicines) to make sure you have good blood sugar levels. The goal for blood sugar control after surgery is 80-180 mg/dL.  Do NOT Smoke (Tobacco/Vaping) or drink Alcohol 24 hours prior to your procedure.  If you use a CPAP at night, you may bring all equipment for your overnight stay.   Contacts, glasses, piercing's, hearing aid's, dentures or partials may not be worn into surgery, please bring cases for these belongings.    For patients admitted to the hospital, discharge time will be determined by your treatment team.   Patients discharged the day of surgery will not be allowed to drive home, and someone needs to stay with them for 24  hours.  ONLY 1 SUPPORT PERSON MAY BE PRESENT WHILE YOU ARE IN SURGERY. IF YOU ARE TO BE ADMITTED ONCE YOU ARE IN YOUR ROOM YOU WILL BE ALLOWED TWO (2) VISITORS.  Minor children may have two parents present. Special consideration for safety and communication needs will be reviewed on a case by case basis.   Special instructions:   [place]- Preparing For Surgery  Before surgery, you can play an important role. Because skin is not sterile, your skin needs to be as free of germs as possible. You can reduce the number of germs on your skin by washing with CHG (chlorahexidine gluconate) Soap before surgery.  CHG is an antiseptic cleaner which kills germs and bonds with the skin to continue killing germs even after washing.    Oral Hygiene is also important to reduce your risk of infection.  Remember - BRUSH YOUR TEETH THE MORNING OF SURGERY WITH YOUR REGULAR TOOTHPASTE  Please do not use if you have an allergy to CHG or antibacterial soaps. If your skin becomes reddened/irritated stop using the CHG.  Do not shave (including legs and underarms) for at least 48 hours prior to first CHG shower. It is OK to shave your face.  Please follow these instructions carefully.   Shower the NIGHT BEFORE SURGERY and the MORNING OF SURGERY  If you chose to wash your hair, wash your hair first as usual with your normal shampoo.  After you shampoo, rinse your hair and body thoroughly to remove the shampoo.  Use CHG Soap as you would any other liquid soap. You can apply CHG directly to the skin and wash gently with a scrungie or a clean washcloth.   Apply the CHG Soap to your body ONLY FROM THE NECK DOWN.  Do not use on open wounds or open sores. Avoid contact with your eyes, ears, mouth and genitals (private parts). Wash Face and genitals (private parts)  with your normal soap.   Wash thoroughly, paying special attention to the area where your surgery will be performed.  Thoroughly rinse your body with warm  water from the neck down.  DO NOT shower/wash with your normal soap after using and rinsing off the CHG Soap.  Pat yourself dry with a CLEAN TOWEL.  Wear CLEAN PAJAMAS to bed the night before surgery  Place CLEAN SHEETS on your bed the night before your surgery  DO NOT SLEEP WITH PETS.   Day of Surgery: Shower with CHG soap. Do not wear jewelry, make up, nail polish, gel polish, artificial nails, or any other type of covering on natural nails including finger and toenails. If patients have artificial nails, gel coating, etc. that need to be removed by a nail salon please have this removed prior to surgery. Surgery may need to be canceled/delayed if the surgeon/ anesthesia feels like the patient is unable to be adequately monitored. Do not wear lotions, powders, perfumes, or deodorant. Do not shave  48 hours prior to surgery.  Do not bring valuables to the hospital. [facility] is not responsible for any belongings or valuables. Wear Clean/Comfortable clothing the morning of surgery Remember to brush your teeth WITH YOUR REGULAR TOOTHPASTE.   Please read over the following fact sheets that you were given.

## 2021-01-10 NOTE — Telephone Encounter (Signed)
Dub Mikes called today and stated that the patient wants to use the Hoveround instead of a manual wheelchair after her surgery. Patient decided to go to rehab after surgery and will call to keep [place] updated on her progress.

## 2021-01-10 NOTE — Progress Notes (Signed)
PCP - Ailene Ards FNP Cardiologist - denies  PPM/ICD - denies Device Orders -  Rep Notified -   Chest x-ray - none EKG - 12/15/20 Stress Test - no ECHO - 12/20/20 Cardiac Cath - no  Sleep Study - scheduled for next month CPAP - no  Fasting Blood Sugar - 120-130 Checks Blood Sugar 2 times a day  Blood Thinner Instructions:n/a Aspirin Instructions:pt stopped 01/07/21  ERAS Protcol -n/a PRE-SURGERY Ensure or G2-   COVID TEST- 01/10/21   Anesthesia review: yes  Patient denies shortness of breath, fever, cough and chest pain at PAT appointment   All instructions explained to the patient, with a verbal understanding of the material. Patient agrees to go over the instructions while at home for a better understanding. Patient also instructed to self quarantine after being tested for COVID-19. The opportunity to ask questions was provided.

## 2021-01-11 ENCOUNTER — Encounter (HOSPITAL_COMMUNITY): Admission: RE | Payer: Self-pay | Source: Home / Self Care

## 2021-01-11 ENCOUNTER — Inpatient Hospital Stay (HOSPITAL_COMMUNITY): Admission: RE | Admit: 2021-01-11 | Payer: Medicare Other | Source: Home / Self Care | Admitting: Specialist

## 2021-01-11 LAB — SARS CORONAVIRUS 2 (TAT 6-24 HRS): SARS Coronavirus 2: NEGATIVE

## 2021-01-11 SURGERY — POSTERIOR LUMBAR FUSION 1 LEVEL
Anesthesia: General

## 2021-01-12 ENCOUNTER — Ambulatory Visit (INDEPENDENT_AMBULATORY_CARE_PROVIDER_SITE_OTHER): Payer: Medicare Other | Admitting: Internal Medicine

## 2021-01-12 ENCOUNTER — Telehealth: Payer: Self-pay | Admitting: Nurse Practitioner

## 2021-01-12 MED ORDER — GLIPIZIDE ER 5 MG PO TB24: 5.0000 mg | ORAL_TABLET | Freq: Every day | ORAL | 0 refills | Status: DC

## 2021-01-12 MED ORDER — TRESIBA FLEXTOUCH 100 UNIT/ML ~~LOC~~ SOPN: 34.0000 [IU] | PEN_INJECTOR | Freq: Every day | SUBCUTANEOUS | 2 refills | Status: DC

## 2021-01-12 NOTE — Telephone Encounter (Signed)
Pt is calling and requesting refills.  glipiZIDE (GLUCOTROL XL) 5 MG 24 hr tablet  insulin degludec (TRESIBA FLEXTOUCH) 100 UNIT/ML FlexTouch Pen   [id number] - [place], [place] [address] AT [facility] Phone:  [phone]  Fax:  [phone]

## 2021-01-12 NOTE — Telephone Encounter (Signed)
Rx sent 

## 2021-01-13 ENCOUNTER — Encounter (INDEPENDENT_AMBULATORY_CARE_PROVIDER_SITE_OTHER): Payer: Self-pay | Admitting: Internal Medicine

## 2021-01-13 ENCOUNTER — Ambulatory Visit (INDEPENDENT_AMBULATORY_CARE_PROVIDER_SITE_OTHER): Payer: Medicare Other | Admitting: Internal Medicine

## 2021-01-13 ENCOUNTER — Other Ambulatory Visit: Payer: Self-pay

## 2021-01-13 VITALS — BP 133/80 | HR 89 | Temp 98.2°F | Resp 18 | Ht 62.0 in | Wt 188.8 lb

## 2021-01-13 DIAGNOSIS — E1165 Type 2 diabetes mellitus with hyperglycemia: Secondary | ICD-10-CM

## 2021-01-13 MED ORDER — TRULICITY 3 MG/0.5ML ~~LOC~~ SOAJ: 3.0000 mg | SUBCUTANEOUS | 3 refills | Status: DC

## 2021-01-13 NOTE — Progress Notes (Signed)
Metrics: Intervention Frequency ACO  Documented Smoking Status Yearly  Screened one or more times in 24 months  Cessation Counseling or  Active cessation medication Past 24 months  Past 24 months   Guideline developer: UpToDate (See UpToDate for funding source) Date Released: 2014       Wellness Office Visit  Subjective:  Patient ID: Stephanie Moreno, female    DOB: July 12, 1945  Age: 75 y.o. MRN: [id number]  CC: This lady comes in due to uncontrolled diabetes. HPI  She was due to have back surgery with orthopedics, Dr. Louanne Skye but the surgery was canceled because her hemoglobin A1c was 8.2%.  I believe a number less than 8% was required. Past Medical History:  Diagnosis Date   Acid reflux disease    Anemia    Breast cancer ([place])    left side   Chronic back pain    Chronic kidney disease    Diabetes mellitus without complication ([place])    Diverticulitis    Hypertension    Lumbosacral spondylosis    Per previous PCP records   Sleep apnea    Thyroid disease    Past Surgical History:  Procedure Laterality Date   ABDOMINAL HYSTERECTOMY     BIOPSY  02/19/2020   Procedure: BIOPSY;  Surgeon: Eloise Harman, DO;  Location: [facility] SU[facility];  Service: Endoscopy;;   BREAST LUMPECTOMY Left 2003   BREAST SURGERY     COLONOSCOPY WITH PROPOFOL N/A 02/19/2020   non-bleeding internal hemorrhoids, many small-mouthed diverticula in entire colon.   ESOPHAGOGASTRODUODENOSCOPY (EGD) WITH PROPOFOL N/A 02/19/2020    benign-appearing esophageal stenosis s/p dilation, gastritis s/p biopsy, normal duodenum. Negative H.pylori, negative duodenal biopsy.    GIVENS CAPSULE STUDY N/A 06/14/2020   Procedure: GIVENS CAPSULE STUDY;  Surgeon: Eloise Harman, DO;  Location: [facility];  Service: Endoscopy;  Laterality: N/A;  7:30am   THYROIDECTOMY     tummy tuck     at time of breast surgery 2003     Family History  Problem Relation Age of Onset   Diabetes Mother    Thyroid disease Mother    Colon  cancer Neg Hx     Social History   Social History Narrative   Not on file   Social History   Tobacco Use   Smoking status: Never   Smokeless tobacco: Never  Substance Use Topics   Alcohol use: Not Currently    Current Meds  Medication Sig   acetaminophen (TYLENOL) 500 MG tablet Take 1,000 mg by mouth every 8 (eight) hours as needed for moderate pain.   amitriptyline (ELAVIL) 50 MG tablet Take 50 mg by mouth at bedtime.   amLODipine (NORVASC) 5 MG tablet Take 5 mg by mouth daily.   beclomethasone (QVAR) 80 MCG/ACT inhaler Inhale 1 puff into the lungs 2 (two) times daily as needed (shortness of breath).   Blood Glucose Monitoring Suppl (ACCU-CHEK GUIDE ME) w/Device KIT 1 Piece by Does not apply route as directed.   cyclobenzaprine (FLEXERIL) 10 MG tablet Take 10 mg by mouth 3 (three) times daily as needed for muscle spasms.   docusate sodium (COLACE) 100 MG capsule Take 100 mg by mouth 2 (two) times daily.   Dulaglutide (TRULICITY) 3 KG/6.7PC SOPN Inject 3 mg as directed once a week.   FLOVENT HFA 220 MCG/ACT inhaler Inhale 1 puff into the lungs daily as needed for wheezing.   furosemide (LASIX) 40 MG tablet Take 40-80 mg by mouth See admin instructions. Takes 40  mg every other day. On opposite day she takes 80 mg tab   gabapentin (NEURONTIN) 300 MG capsule Take 300 mg by mouth at bedtime.   glipiZIDE (GLUCOTROL XL) 5 MG 24 hr tablet Take 1 tablet (5 mg total) by mouth daily with breakfast.   GLUCOSAMINE-CHONDROITIN PO Take 1 tablet by mouth daily.   glucose blood (ACCU-CHEK GUIDE) test strip Use as instructed   insulin degludec (TRESIBA FLEXTOUCH) 100 UNIT/ML FlexTouch Pen Inject 34 Units into the skin daily.   Insulin Pen Needle (B-D ULTRAFINE III SHORT PEN) 31G X 8 MM MISC 1 each by Does not apply route as directed.   Lancets (ONETOUCH DELICA PLUS [id number]) MISC Apply topically 3 (three) times daily.   lidocaine (LIDODERM) 5 % Place 1 patch onto the skin daily as needed (pain).  Remove & Discard patch within 12 hours or as directed by MD. Applied to lower back/shoulders/neck for pain   linaclotide (LINZESS) 145 MCG CAPS capsule Take 1 capsule (145 mcg total) by mouth daily before breakfast.   losartan (COZAAR) 50 MG tablet Take 100 mg by mouth daily.   magnesium oxide (MAG-OX) 400 MG tablet Take 400 mg by mouth daily.   meclizine (ANTIVERT) 25 MG tablet Take 25 mg by mouth 3 (three) times daily as needed for dizziness.   metoprolol tartrate (LOPRESSOR) 50 MG tablet Take 50 mg by mouth 2 (two) times daily.   Naphazoline HCl (CLEAR EYES OP) Place 1 drop into both eyes daily as needed (dry eyes).   pantoprazole (PROTONIX) 40 MG tablet Take 1 tablet (40 mg total) by mouth daily. 30 minutes before breakfast   phenazopyridine (PYRIDIUM) 200 MG tablet Take 1 tablet (200 mg total) by mouth 3 (three) times daily.   polyethylene glycol (MIRALAX / GLYCOLAX) 17 g packet Take 17 g by mouth daily as needed for moderate constipation.   rosuvastatin (CRESTOR) 10 MG tablet Take 10 mg by mouth at bedtime.    verapamil (CALAN-SR) 180 MG CR tablet Take 180 mg by mouth daily.   [DISCONTINUED] Dulaglutide (TRULICITY) 1.5 MB/8.4YK SOPN Inject 1.5 mg into the skin every Tuesday.     [place] Office Visit from 12/15/2020 in [facility]  PHQ-9 Total Score 10       Objective:   Today's Vitals: BP 133/80 (BP Location: Right Arm, Patient Position: Sitting, Cuff Size: Normal)   Pulse 89   Temp 98.2 F (36.8 C) (Temporal)   Resp 18   Ht 5' 2"  (1.575 m)   Wt 188 lb 12.8 oz (85.6 kg)   SpO2 98%   BMI 34.53 kg/m  Vitals with BMI 01/13/2021 01/10/2021 01/06/2021  Height 5' 2"  5' 2"  5' 2"   Weight 188 lbs 13 oz 188 lbs 5 oz 190 lbs  BMI 34.52 59.93 57.01  Systolic 779 390 300  Diastolic 80 85 77  Pulse 89 74 73     Physical Exam  Blood pressure is reasonable.  Weight is stable.     Assessment   1. Uncontrolled type 2 diabetes mellitus with hyperglycemia ([place])        Tests ordered No orders of the defined types were placed in this encounter.    Plan: 1.  I am going to increase her Trulicity to 3 mg once a week and continue with the rest of her diabetic medications as before.  Hopefully, this will improve her A1c. 2.  Follow-up with endocrinology in a couple of weeks time as scheduled.    Meds ordered  this encounter  Medications   Dulaglutide (TRULICITY) 3 QH/2.2VJ SOPN    Sig: Inject 3 mg as directed once a week.    Dispense:  2 mL    Refill:  3     Keauna Brasel Luther Parody, MD

## 2021-01-13 NOTE — Progress Notes (Signed)
Asa 81 -stop for procedure. She said was trying to see if they could take her office some of medications before she moved won to [place].

## 2021-01-19 ENCOUNTER — Telehealth (INDEPENDENT_AMBULATORY_CARE_PROVIDER_SITE_OTHER): Payer: Self-pay | Admitting: Nurse Practitioner

## 2021-01-19 ENCOUNTER — Ambulatory Visit (INDEPENDENT_AMBULATORY_CARE_PROVIDER_SITE_OTHER): Payer: Medicare HMO | Admitting: Internal Medicine

## 2021-01-19 NOTE — Telephone Encounter (Signed)
Will you call this patient and see if she is willing to undergo screening mammogram. If so, it should already be ordered it just needs to be scheduled.

## 2021-01-20 ENCOUNTER — Encounter: Payer: Medicare Other | Admitting: Surgery

## 2021-01-25 NOTE — Patient Instructions (Signed)
Diabetes Mellitus and Nutrition, Adult When you have diabetes, or diabetes mellitus, it is very important to have healthy eating habits because your blood sugar (glucose) levels are greatly affected by what you eat and drink. Eating healthy foods in the right amounts, at about the same times every day, can help you:  Control your blood glucose.  Lower your risk of heart disease.  Improve your blood pressure.  Reach or maintain a healthy weight. What can affect my meal plan? Every person with diabetes is different, and each person has different needs for a meal plan. Your health care provider may recommend that you work with a dietitian to make a meal plan that is best for you. Your meal plan may vary depending on factors such as:  The calories you need.  The medicines you take.  Your weight.  Your blood glucose, blood pressure, and cholesterol levels.  Your activity level.  Other health conditions you have, such as heart or kidney disease. How do carbohydrates affect me? Carbohydrates, also called carbs, affect your blood glucose level more than any other type of food. Eating carbs naturally raises the amount of glucose in your blood. Carb counting is a method for keeping track of how many carbs you eat. Counting carbs is important to keep your blood glucose at a healthy level, especially if you use insulin or take certain oral diabetes medicines. It is important to know how many carbs you can safely have in each meal. This is different for every person. Your dietitian can help you calculate how many carbs you should have at each meal and for each snack. How does alcohol affect me? Alcohol can cause a sudden decrease in blood glucose (hypoglycemia), especially if you use insulin or take certain oral diabetes medicines. Hypoglycemia can be a life-threatening condition. Symptoms of hypoglycemia, such as sleepiness, dizziness, and confusion, are similar to symptoms of having too much  alcohol.  Do not drink alcohol if: ? Your health care provider tells you not to drink. ? You are pregnant, may be pregnant, or are planning to become pregnant.  If you drink alcohol: ? Do not drink on an empty stomach. ? Limit how much you use to:  0-1 drink a day for women.  0-2 drinks a day for men. ? Be aware of how much alcohol is in your drink. In the [place] one drink equals one 12 oz bottle of beer (355 mL), one 5 oz glass of wine (148 mL), or one 1 oz glass of hard liquor (44 mL). ? Keep yourself hydrated with water, diet soda, or unsweetened iced tea.  Keep in mind that regular soda, juice, and other mixers may contain a lot of sugar and must be counted as carbs. What are tips for following this plan? Reading food labels  Start by checking the serving size on the "Nutrition Facts" label of packaged foods and drinks. The amount of calories, carbs, fats, and other nutrients listed on the label is based on one serving of the item. Many items contain more than one serving per package.  Check the total grams (g) of carbs in one serving. You can calculate the number of servings of carbs in one serving by dividing the total carbs by 15. For example, if a food has 30 g of total carbs per serving, it would be equal to 2 servings of carbs.  Check the number of grams (g) of saturated fats and trans fats in one serving. Choose foods that have   a low amount or none of these fats.  Check the number of milligrams (mg) of salt (sodium) in one serving. Most people should limit total sodium intake to less than 2,300 mg per day.  Always check the nutrition information of foods labeled as "low-fat" or "nonfat." These foods may be higher in added sugar or refined carbs and should be avoided.  Talk to your dietitian to identify your daily goals for nutrients listed on the label. Shopping  Avoid buying canned, pre-made, or processed foods. These foods tend to be high in fat, sodium, and added  sugar.  Shop around the outside edge of the grocery store. This is where you will most often find fresh fruits and vegetables, bulk grains, fresh meats, and fresh dairy. Cooking  Use low-heat cooking methods, such as baking, instead of high-heat cooking methods like deep frying.  Cook using healthy oils, such as olive, canola, or sunflower oil.  Avoid cooking with butter, cream, or high-fat meats. Meal planning  Eat meals and snacks regularly, preferably at the same times every day. Avoid going long periods of time without eating.  Eat foods that are high in fiber, such as fresh fruits, vegetables, beans, and whole grains. Talk with your dietitian about how many servings of carbs you can eat at each meal.  Eat 4-6 oz (112-168 g) of lean protein each day, such as lean meat, chicken, fish, eggs, or tofu. One ounce (oz) of lean protein is equal to: ? 1 oz (28 g) of meat, chicken, or fish. ? 1 egg. ?  cup (62 g) of tofu.  Eat some foods each day that contain healthy fats, such as avocado, nuts, seeds, and fish.   What foods should I eat? Fruits Berries. Apples. Oranges. Peaches. Apricots. Plums. Grapes. Mango. Papaya. Pomegranate. Kiwi. Cherries. Vegetables Lettuce. Spinach. Leafy greens, including kale, chard, collard greens, and mustard greens. Beets. Cauliflower. Cabbage. Broccoli. Carrots. Green beans. Tomatoes. Peppers. Onions. Cucumbers. Brussels sprouts. Grains Whole grains, such as whole-wheat or whole-grain bread, crackers, tortillas, cereal, and pasta. Unsweetened oatmeal. Quinoa. Brown or wild rice. Meats and other proteins Seafood. Poultry without skin. Lean cuts of poultry and beef. Tofu. Nuts. Seeds. Dairy Low-fat or fat-free dairy products such as milk, yogurt, and cheese. The items listed above may not be a complete list of foods and beverages you can eat. Contact a dietitian for more information. What foods should I avoid? Fruits Fruits canned with  syrup. Vegetables Canned vegetables. Frozen vegetables with butter or cream sauce. Grains Refined white flour and flour products such as bread, pasta, snack foods, and cereals. Avoid all processed foods. Meats and other proteins Fatty cuts of meat. Poultry with skin. Breaded or fried meats. Processed meat. Avoid saturated fats. Dairy Full-fat yogurt, cheese, or milk. Beverages Sweetened drinks, such as soda or iced tea. The items listed above may not be a complete list of foods and beverages you should avoid. Contact a dietitian for more information. Questions to ask a health care provider  Do I need to meet with a diabetes educator?  Do I need to meet with a dietitian?  What number can I call if I have questions?  When are the best times to check my blood glucose? Where to find more information:  American Diabetes Association: diabetes.org  Academy of Nutrition and Dietetics: www.eatright.org  National Institute of Diabetes and Digestive and Kidney Diseases: www.niddk.nih.gov  Association of Diabetes Care and Education Specialists: www.diabeteseducator.org Summary  It is important to have healthy eating   habits because your blood sugar (glucose) levels are greatly affected by what you eat and drink.  A healthy meal plan will help you control your blood glucose and maintain a healthy lifestyle.  Your health care provider may recommend that you work with a dietitian to make a meal plan that is best for you.  Keep in mind that carbohydrates (carbs) and alcohol have immediate effects on your blood glucose levels. It is important to count carbs and to use alcohol carefully. This information is not intended to replace advice given to you by your health care provider. Make sure you discuss any questions you have with your health care provider. Document Revised: 05/27/2019 Document Reviewed: 05/27/2019 Elsevier Patient Education  2021 Elsevier Inc.  

## 2021-01-26 ENCOUNTER — Encounter: Payer: Self-pay | Admitting: Nurse Practitioner

## 2021-01-26 ENCOUNTER — Ambulatory Visit (INDEPENDENT_AMBULATORY_CARE_PROVIDER_SITE_OTHER): Payer: Medicare Other | Admitting: Nurse Practitioner

## 2021-01-26 ENCOUNTER — Other Ambulatory Visit: Payer: Self-pay

## 2021-01-26 VITALS — BP 146/90 | HR 78 | Ht 62.0 in | Wt 185.2 lb

## 2021-01-26 DIAGNOSIS — N1832 Chronic kidney disease, stage 3b: Secondary | ICD-10-CM

## 2021-01-26 DIAGNOSIS — E782 Mixed hyperlipidemia: Secondary | ICD-10-CM | POA: Diagnosis not present

## 2021-01-26 DIAGNOSIS — E1122 Type 2 diabetes mellitus with diabetic chronic kidney disease: Secondary | ICD-10-CM | POA: Diagnosis not present

## 2021-01-26 DIAGNOSIS — I10 Essential (primary) hypertension: Secondary | ICD-10-CM

## 2021-01-26 MED ORDER — ONETOUCH DELICA PLUS LANCET33G MISC: 11 refills | Status: DC

## 2021-01-26 MED ORDER — BD PEN NEEDLE SHORT U/F 31G X 8 MM MISC: 1.0000 | 3 refills | Status: DC

## 2021-01-26 MED ORDER — TRESIBA FLEXTOUCH 100 UNIT/ML ~~LOC~~ SOPN: 40.0000 [IU] | PEN_INJECTOR | Freq: Every day | SUBCUTANEOUS | 2 refills | Status: DC

## 2021-01-26 NOTE — Progress Notes (Signed)
01/26/2021, 11:36 AM  Endocrinology follow-up note   Subjective:    Patient ID: Stephanie Moreno, female    DOB: [birth year].  Stephanie Moreno is being seen in follow-up after she was seen in consultation for management of currently uncontrolled symptomatic diabetes requested by  Ailene Ards, NP.   Past Medical History:  Diagnosis Date   Acid reflux disease    Anemia    Breast cancer ([place])    left side   Chronic back pain    Chronic kidney disease    Diabetes mellitus without complication ([place])    Diverticulitis    Hypertension    Lumbosacral spondylosis    Per previous PCP records   Sleep apnea    Thyroid disease     Past Surgical History:  Procedure Laterality Date   ABDOMINAL HYSTERECTOMY     BIOPSY  02/19/2020   Procedure: BIOPSY;  Surgeon: Eloise Harman, DO;  [facility]: [facility];  Service: Endoscopy;;   BREAST LUMPECTOMY Left 2003   BREAST SURGERY     COLONOSCOPY WITH PROPOFOL N/A 02/19/2020   non-bleeding internal hemorrhoids, many small-mouthed diverticula in entire colon.   ESOPHAGOGASTRODUODENOSCOPY (EGD) WITH PROPOFOL N/A 02/19/2020    benign-appearing esophageal stenosis s/p dilation, gastritis s/p biopsy, normal duodenum. Negative H.pylori, negative duodenal biopsy.    GIVENS CAPSULE STUDY N/A 06/14/2020   Procedure: GIVENS CAPSULE STUDY;  Surgeon: Eloise Harman, DO;  Location: [facility];  Service: Endoscopy;  Laterality: N/A;  7:30am   THYROIDECTOMY     tummy tuck     at time of breast surgery 2003    Social History   Socioeconomic History   Marital status: Divorced    Spouse name: Not on file   Number of children: Not on file   Years of education: Not on file   Highest education level: Not on file  Occupational History   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 [place]  Tobacco Use   Smoking status: Never   Smokeless tobacco: Never  Vaping Use   Vaping Use: Never used  Substance and  Sexual Activity   Alcohol use: Not Currently   Drug use: Not Currently   Sexual activity: Not on file  Other Topics Concern   Not on file  Social History Narrative   Not on file   Social Determinants of Health   Financial Resource Strain: Not on file  Food Insecurity: Not on file  Transportation Needs: Not on file  Physical Activity: Not on file  Stress: Not on file  Social Connections: Not on file    Family History  Problem Relation Age of Onset   Diabetes Mother    Thyroid disease Mother    Colon cancer Neg Hx     Outpatient Encounter Medications as of 01/26/2021  Medication Sig   acetaminophen (TYLENOL) 500 MG tablet Take 1,000 mg by mouth every 8 (eight) hours as needed for moderate pain.   amitriptyline (ELAVIL) 50 MG tablet Take 50 mg by mouth at bedtime.   amLODipine (NORVASC) 5 MG tablet Take 5 mg by mouth daily.   beclomethasone (QVAR) 80 MCG/ACT inhaler Inhale 1 puff into the lungs 2 (two) times daily as needed (shortness of breath).   Blood Glucose Monitoring Suppl (ACCU-CHEK GUIDE ME) w/Device KIT 1 Piece by Does not apply route as directed.   cyclobenzaprine (FLEXERIL) 10 MG tablet Take 10 mg by mouth 3 (  three) times daily as needed for muscle spasms.   docusate sodium (COLACE) 100 MG capsule Take 100 mg by mouth 2 (two) times daily.   Dulaglutide (TRULICITY) 3 KP/5.4SF SOPN Inject 3 mg as directed once a week.   FLOVENT HFA 220 MCG/ACT inhaler Inhale 1 puff into the lungs daily as needed for wheezing.   furosemide (LASIX) 40 MG tablet Take 40-80 mg by mouth See admin instructions. Takes 40 mg every other day. On opposite day she takes 80 mg tab   gabapentin (NEURONTIN) 300 MG capsule Take 300 mg by mouth at bedtime.   glipiZIDE (GLUCOTROL XL) 5 MG 24 hr tablet Take 1 tablet (5 mg total) by mouth daily with breakfast.   GLUCOSAMINE-CHONDROITIN PO Take 1 tablet by mouth daily.   glucose blood (ACCU-CHEK GUIDE) test strip Use as instructed   insulin degludec  (TRESIBA FLEXTOUCH) 100 UNIT/ML FlexTouch Pen Inject 40 Units into the skin daily.   Insulin Pen Needle (B-D ULTRAFINE III SHORT PEN) 31G X 8 MM MISC 1 each by Does not apply route as directed.   Lancets (ONETOUCH DELICA PLUS [id number]) MISC Use to check glucose twice daily   lidocaine (LIDODERM) 5 % Place 1 patch onto the skin daily as needed (pain). Remove & Discard patch within 12 hours or as directed by MD. Applied to lower back/shoulders/neck for pain   linaclotide (LINZESS) 145 MCG CAPS capsule Take 1 capsule (145 mcg total) by mouth daily before breakfast.   losartan (COZAAR) 50 MG tablet Take 100 mg by mouth daily.   magnesium oxide (MAG-OX) 400 MG tablet Take 400 mg by mouth daily.   meclizine (ANTIVERT) 25 MG tablet Take 25 mg by mouth 3 (three) times daily as needed for dizziness.   metoprolol tartrate (LOPRESSOR) 50 MG tablet Take 50 mg by mouth 2 (two) times daily.   Naphazoline HCl (CLEAR EYES OP) Place 1 drop into both eyes daily as needed (dry eyes).   pantoprazole (PROTONIX) 40 MG tablet Take 1 tablet (40 mg total) by mouth daily. 30 minutes before breakfast   phenazopyridine (PYRIDIUM) 200 MG tablet Take 1 tablet (200 mg total) by mouth 3 (three) times daily.   polyethylene glycol (MIRALAX / GLYCOLAX) 17 g packet Take 17 g by mouth daily as needed for moderate constipation.   rosuvastatin (CRESTOR) 10 MG tablet Take 10 mg by mouth at bedtime.    verapamil (CALAN-SR) 180 MG CR tablet Take 180 mg by mouth daily.   [DISCONTINUED] insulin degludec (TRESIBA FLEXTOUCH) 100 UNIT/ML FlexTouch Pen Inject 34 Units into the skin daily.   [DISCONTINUED] Insulin Pen Needle (B-D ULTRAFINE III SHORT PEN) 31G X 8 MM MISC 1 each by Does not apply route as directed.   [DISCONTINUED] Lancets (ONETOUCH DELICA PLUS [id number]) MISC Apply topically 3 (three) times daily.   No facility-administered encounter medications on file as of 01/26/2021.    ALLERGIES: Allergies  Allergen Reactions   Codeine  Nausea And Vomiting and Other (See Comments)   Hydrochlorothiazide Other (See Comments)   Lisinopril Nausea And Vomiting and Other (See Comments)   Oxybutynin Other (See Comments)   Penicillin G Other (See Comments)   Penicillins Hives    VACCINATION STATUS: Immunization History  Administered Date(s) Administered   Pneumococcal Conjugate-13 09/02/2014   Pneumococcal Polysaccharide-23 07/03/2010   Tdap 07/03/2000    Diabetes She presents for her follow-up diabetic visit. She has type 2 diabetes mellitus. Onset time: Diagnosed at approx age of 52. Her disease course has been fluctuating.  There are no hypoglycemic associated symptoms. Associated symptoms include fatigue, foot paresthesias and weight loss. Pertinent negatives for diabetes include no polydipsia and no polyuria. There are no hypoglycemic complications. Symptoms are stable. Diabetic complications include a CVA, nephropathy and peripheral neuropathy. Risk factors for coronary artery disease include diabetes mellitus, dyslipidemia, hypertension, post-menopausal and sedentary lifestyle. Current diabetic treatment includes insulin injections and oral agent (monotherapy). She is compliant with treatment most of the time. Her weight is fluctuating minimally. She is following a generally unhealthy diet. When asked about meal planning, she reported none. She has not had a previous visit with a dietitian. She rarely participates in exercise. Her home blood glucose trend is increasing steadily. Her breakfast blood glucose range is generally 180-200 mg/dl. Her bedtime blood glucose range is generally >200 mg/dl. Her overall blood glucose range is >200 mg/dl. (She presents today with her meter and logs showing above target fasting and postprandial glycemic profile.  Her previsit A1c was 8.2% on 7/11, increasing from previous visit of 7.4% which prevented her from having her back surgery.  Her PCP recently increased her dose of Trulicity to 3 mg SQ  weekly.  She denies any recent episodes of hypoglycemia.) An ACE inhibitor/angiotensin II receptor blocker is being taken. She does not see a podiatrist.Eye exam is current.  Hypertension This is a chronic problem. The current episode started more than 1 year ago. The problem is unchanged. The problem is controlled. Agents associated with hypertension include thyroid hormones. Risk factors for coronary artery disease include diabetes mellitus, dyslipidemia, post-menopausal state and sedentary lifestyle. Past treatments include calcium channel blockers, diuretics, angiotensin blockers and beta blockers. There are no compliance problems.  Hypertensive end-organ damage includes kidney disease and CVA. Identifiable causes of hypertension include chronic renal disease, sleep apnea and a thyroid problem.  Hyperlipidemia This is a chronic problem. The current episode started more than 1 year ago. The problem is controlled. Recent lipid tests were reviewed and are normal. Exacerbating diseases include chronic renal disease and hypothyroidism. Factors aggravating her hyperlipidemia include beta blockers. Current antihyperlipidemic treatment includes statins. The current treatment provides moderate improvement of lipids. There are no compliance problems.  Risk factors for coronary artery disease include diabetes mellitus, dyslipidemia, hypertension, obesity, post-menopausal and a sedentary lifestyle.    Review of systems  Constitutional: + steadily increasing body weight,  current Body mass index is 33.87 kg/m. , + fatigue, no subjective hyperthermia, no subjective hypothermia Eyes: no blurry vision, no xerophthalmia ENT: no sore throat, no nodules palpated in throat, no dysphagia/odynophagia, no hoarseness Cardiovascular: no chest pain, no shortness of breath, no palpitations, + leg swelling Respiratory: no cough, no shortness of breath Gastrointestinal: no nausea/vomiting/diarrhea Musculoskeletal: no  muscle/joint aches, walks with walker due to previous CVA and disequilibrium Skin: no rashes, no hyperemia Neurological: no tremors, no dizziness, + numbness/tingling to BLE (on gabapentin) Psychiatric: no depression, no anxiety  Objective:     BP (!) 146/90   Pulse 78   Ht _0  (1.575 m)   Wt 185 lb 3.2 oz (84 kg)   BMI 33.87 kg/m   Wt Readings from Last 3 Encounters:  01/26/21 185 lb 3.2 oz (84 kg)  01/13/21 188 lb 12.8 oz (85.6 kg)  01/10/21 188 lb 4.8 oz (85.4 kg)    BP Readings from Last 3 Encounters:  01/26/21 (!) 146/90  01/13/21 133/80  01/10/21 (!) 159/85     Physical Exam- Limited  Constitutional:  Body mass index is 33.87 kg/m. , not  in acute distress, normal state of mind Eyes:  EOMI, no exophthalmos Neck: Supple Cardiovascular: RRR, no murmurs, rubs, or gallops, + pitting edema to BLE Respiratory: Adequate breathing efforts, no crackles, rales, rhonchi, or wheezing Musculoskeletal: no gross deformities, strength intact in all four extremities, no gross restriction of joint movements, walks with walker due to previous CVA and disequilibrium Skin:  no rashes, no hyperemia Neurological: no tremor with outstretched hands     CMP ( most recent) CMP     Component Value Date/Time   NA 135 01/10/2021 0930   K 4.6 01/10/2021 0930   CL 104 01/10/2021 0930   CO2 23 01/10/2021 0930   GLUCOSE 158 (H) 01/10/2021 0930   BUN 22 01/10/2021 0930   BUN 9 03/09/2020 0000   CREATININE 1.40 (H) 01/10/2021 0930   CREATININE 1.42 (H) 10/12/2020 1009   CALCIUM 9.8 01/10/2021 0930   PROT 8.4 (H) 10/12/2020 1009   ALBUMIN 3.6 04/12/2020 1423   AST 30 10/12/2020 1009   ALT 25 10/12/2020 1009   ALKPHOS 76 04/12/2020 1423   BILITOT 0.4 10/12/2020 1009   GFRNONAA 39 (L) 01/10/2021 0930   GFRNONAA 36 (L) 10/12/2020 1009   GFRAA 42 (L) 10/12/2020 1009     Diabetic Labs (most recent): Lab Results  Component Value Date   HGBA1C 8.2 (H) 01/10/2021   HGBA1C 7.4 (H)  10/12/2020   HGBA1C 8.6 (A) 07/12/2020     Lipid Panel ( most recent) Lipid Panel     Component Value Date/Time   CHOL 122 10/12/2020 1009   TRIG 79 10/12/2020 1009   HDL 60 10/12/2020 1009   CHOLHDL 2.0 10/12/2020 1009   LDLCALC 46 10/12/2020 1009      Lab Results  Component Value Date   TSH 3.94 10/12/2020   TSH 1.18 03/09/2020      Assessment & Plan:   1) Type 2 diabetes mellitus with stage 3b chronic kidney disease, without long-term current use of insulin ([place])  - Stephanie Moreno has currently uncontrolled symptomatic type 2 DM since  75 years of age.  She presents today with her meter and logs showing above target fasting and postprandial glycemic profile.  Her previsit A1c was 8.2% on 7/11, increasing from previous visit of 7.4% which prevented her from having her back surgery.  Her PCP recently increased her dose of Trulicity to 3 mg SQ weekly.  She denies any recent episodes of hypoglycemia.   Recent labs reviewed.    - I had a long discussion with her about the progressive nature of diabetes and the pathology behind its complications. -her diabetes is complicated by CKD, CVA and she remains at a high risk for more acute and chronic complications which include CAD, CVA, CKD, retinopathy, and neuropathy. These are all discussed in detail with her.  - Nutritional counseling repeated at each appointment due to patients tendency to fall back in to old habits.  - The patient admits there is a room for improvement in their diet and drink choices. -  Suggestion is made for the patient to avoid simple carbohydrates from their diet including Cakes, Sweet Desserts / Pastries, Ice Cream, Soda (diet and regular), Sweet Tea, Candies, Chips, Cookies, Sweet Pastries, Store Bought Juices, Alcohol in Excess of 1-2 drinks a day, Artificial Sweeteners, Coffee Creamer, and "Sugar-free" Products. This will help patient to have stable blood glucose profile and potentially avoid unintended  weight gain.   - I encouraged the patient to switch to unprocessed or  minimally processed complex starch and increased protein intake (animal or plant source), fruits, and vegetables.   - Patient is advised to stick to a routine mealtimes to eat 3 meals a day and avoid unnecessary snacks (to snack only to correct hypoglycemia).  - she will be scheduled with Jearld Fenton, RDN, CDE for diabetes education.  - I have approached her with the following individualized plan to manage  her diabetes and patient agrees:   - she will need at least basal insulin in order for her to achieve control of diabetes to target, and she accepts this treatment option at this time.    -Given her persistent fasting hyperglycemia, she will tolerate increase in her Tresiba to 40 units SQ daily at bedtime.  She can continue Trulicity 3 mg SQ weekly for now (however this is not meant to be a long-term dose- is meant to be stepping stone to 4.5 mg dose) and Glipizide 5 mg XL daily with breakfast.    -She is encouraged to continue monitoring blood glucose twice daily, before breakfast and before bed, and to call the clinic if she has readings less than 70 or greater than 200 for 3 tests in a row,  - Specific targets for  A1c;  LDL, HDL,  and Triglycerides were discussed with the patient.  2) Blood Pressure /Hypertension:   Her blood pressure is controlled to target for her age.   she is advised to continue her current medications including Losartan 100 mg p.o. daily with breakfast, Norvasc 10 mg po daily, Lasix 40 mg po daily, and Metoprolol 50 mg po twice daily .  3) Lipids/Hyperlipidemia:    Review of her recent lipid panel from 10/12/20 showed controlled LDL at 46.  she  is advised to continue Crestor 10 mg p.o. daily at bedtime.   Side effects and precautions discussed with her.  4)  Weight/Diet:  Her Body mass index is 33.87 kg/m.  -   clearly complicating her diabetes care.   she is  a candidate for weight loss. I  discussed with her the fact that loss of 5 - 10% of her  current body weight will have the most impact on her diabetes management.  Exercise, and detailed carbohydrates information provided  -  detailed on discharge instructions.  5) Chronic Care/Health Maintenance: -she  is on ACEI/ARB and Statin medications and  is encouraged to initiate and continue to follow up with Ophthalmology, Dentist,  Podiatrist at least yearly or according to recommendations, and advised to  stay away from smoking. I have recommended yearly flu vaccine and pneumonia vaccine at least every 5 years; she cannot exercise optimally due to her disequilibrium.  She is advised to sleep for at least 7 hours a day.  - she is  advised to maintain close follow up with Ailene Ards, NP for primary care needs, as well as her other providers for optimal and coordinated care.     I spent 43 minutes in the care of the patient today including review of labs from [place], Lipids, Thyroid Function, Hematology (current and previous including abstractions from other facilities); face-to-face time discussing  her blood glucose readings/logs, discussing hypoglycemia and hyperglycemia episodes and symptoms, medications doses, her options of short and long term treatment based on the latest standards of care / guidelines;  discussion about incorporating lifestyle medicine;  and documenting the encounter.    Please refer to Patient Instructions for Blood Glucose Monitoring and Insulin/Medications Dosing Guide"  in media  tab for additional information. Please  also refer to " Patient Self Inventory" in the Media  tab for reviewed elements of pertinent patient history.  Stephanie Moreno participated in the discussions, expressed understanding, and voiced agreement with the above plans.  All questions were answered to her satisfaction. she is encouraged to contact clinic should she have any questions or concerns prior to her return visit.   Follow up  plan: - Return in about 3 months (around 04/28/2021) for Diabetes F/U with A1c in office, No previsit labs, Bring meter and logs.  Rayetta Pigg, [facility] [address] [place], [place] [address] Phone: [phone] Fax: [phone]  01/26/2021, 11:36 AM

## 2021-01-27 ENCOUNTER — Encounter: Payer: Self-pay | Admitting: Podiatry

## 2021-01-27 ENCOUNTER — Ambulatory Visit (INDEPENDENT_AMBULATORY_CARE_PROVIDER_SITE_OTHER): Payer: Medicare Other | Admitting: Podiatry

## 2021-01-27 DIAGNOSIS — E1142 Type 2 diabetes mellitus with diabetic polyneuropathy: Secondary | ICD-10-CM

## 2021-01-27 DIAGNOSIS — W57XXXA Bitten or stung by nonvenomous insect and other nonvenomous arthropods, initial encounter: Secondary | ICD-10-CM

## 2021-01-27 DIAGNOSIS — B351 Tinea unguium: Secondary | ICD-10-CM

## 2021-01-27 DIAGNOSIS — M79676 Pain in unspecified toe(s): Secondary | ICD-10-CM | POA: Diagnosis not present

## 2021-01-27 DIAGNOSIS — S80862A Insect bite (nonvenomous), left lower leg, initial encounter: Secondary | ICD-10-CM

## 2021-01-27 MED ORDER — DOXYCYCLINE HYCLATE 100 MG PO TABS: 100.0000 mg | ORAL_TABLET | Freq: Two times a day (BID) | ORAL | 0 refills | Status: DC

## 2021-01-27 NOTE — Progress Notes (Signed)
She presents today for follow-up of her diabetic peripheral neuropathy as well as her painful elongated nails.  States that she is also has several bites around her ankles which are painful.  Objective: Vital signs are stable she is alert oriented x3.  States that she has not yet been able to get her surgery done because her blood sugars are too high.  She has multiple insect bites around her ankles with mild erythema it is warm and itchy.  There is no purulence no malodor.  Toenails are long thick yellow dystrophic clinically mycotic.  Assessment: Diabetic peripheral neuropathy bilateral pain in limb secondary to onychomycosis.  Insect bites bilateral.  Plan: I will start her on doxycycline just for the insect bites.  I am also going to debride her nails 1 through 5 bilateral.  I will follow-up with her in 3 months.

## 2021-01-31 ENCOUNTER — Telehealth: Payer: Self-pay

## 2021-01-31 NOTE — Telephone Encounter (Signed)
Pt requesting a call back from the nurse.  °

## 2021-01-31 NOTE — Telephone Encounter (Signed)
Tried to call pt, received a busy signal at time of call.

## 2021-02-01 NOTE — Telephone Encounter (Signed)
Called pt, she stated she had eaten something Friday that had caused her BP and BG to go up over the weekend. States she is feeling better today and her BP and BG readings are WNL.

## 2021-02-07 ENCOUNTER — Other Ambulatory Visit: Payer: Self-pay | Admitting: Gastroenterology

## 2021-02-07 ENCOUNTER — Other Ambulatory Visit: Payer: Self-pay | Admitting: Nurse Practitioner

## 2021-02-08 ENCOUNTER — Telehealth: Payer: Self-pay

## 2021-02-08 MED ORDER — GLUCOSE BLOOD VI STRP: 1.0000 | ORAL_STRIP | Freq: Three times a day (TID) | 5 refills | Status: DC

## 2021-02-08 NOTE — Telephone Encounter (Signed)
Please call patient to verify if she is taking Colace 100 mg twice a day.  I do not see this on her medication list.  I see we are treating her constipation with Linzess.

## 2021-02-08 NOTE — Telephone Encounter (Signed)
Rx Sent  

## 2021-02-08 NOTE — Telephone Encounter (Signed)
Patient requesting refill on one touch ultra test strips. She would like that sent to [facility] in [place]

## 2021-02-09 NOTE — Telephone Encounter (Signed)
I phoned and spoke with the pt and was advised by [place] she don't know who prescribed the colace for her but she does take it along with the [place]. I advised her that it is a historical provider, MD but she didn't see a name on it. Please advise

## 2021-02-10 NOTE — Telephone Encounter (Signed)
Rx sent 

## 2021-02-10 NOTE — Telephone Encounter (Signed)
Phoned the pt and advised that Rx has been sent in to the pharmacy.

## 2021-02-14 ENCOUNTER — Other Ambulatory Visit: Payer: Self-pay

## 2021-02-14 ENCOUNTER — Ambulatory Visit (INDEPENDENT_AMBULATORY_CARE_PROVIDER_SITE_OTHER): Payer: Medicare Other | Admitting: Specialist

## 2021-02-14 ENCOUNTER — Encounter: Payer: Self-pay | Admitting: Specialist

## 2021-02-14 VITALS — BP 150/88 | HR 84 | Ht 62.0 in | Wt 185.2 lb

## 2021-02-14 DIAGNOSIS — M5136 Other intervertebral disc degeneration, lumbar region: Secondary | ICD-10-CM

## 2021-02-14 DIAGNOSIS — M48062 Spinal stenosis, lumbar region with neurogenic claudication: Secondary | ICD-10-CM

## 2021-02-14 DIAGNOSIS — G9519 Other vascular myelopathies: Secondary | ICD-10-CM

## 2021-02-14 DIAGNOSIS — M4726 Other spondylosis with radiculopathy, lumbar region: Secondary | ICD-10-CM

## 2021-02-14 DIAGNOSIS — M4317 Spondylolisthesis, lumbosacral region: Secondary | ICD-10-CM

## 2021-02-14 DIAGNOSIS — M5416 Radiculopathy, lumbar region: Secondary | ICD-10-CM

## 2021-02-14 NOTE — Patient Instructions (Signed)
  Plan: Avoid bending, stooping and avoid lifting weights greater than 10 lbs. Avoid prolong standing and walking. Order for a new walker with wheels. Surgery scheduling secretary Kandice Hams, will call you in the next week to schedule for surgery.  Surgery recommended is a two level lumbar decompressive central laminectomy L4-5 and L5-S1 and one level fusion L5-S1 this would be done with rods, screws and cages with local bone graft and allograft (donor bone graft). Take hydrocodone for for pain. Risk of surgery includes risk of infection 1 in 200 patients, bleeding 1/2% chance you would need a transfusion.   Risk to the nerves is one in 10,000. You will need to use a brace for 3 months and wean from the brace on the 4th month. Expect improved walking and standing tolerance. Expect relief of leg pain but numbness may persist depending on the length and degree of pressure that has been pres

## 2021-02-14 NOTE — Progress Notes (Signed)
Office Visit Note   Patient: Stephanie Moreno           Date of Birth: [birth year]           MRN: [id number] Visit Date: 02/14/2021              Requested by: Ailene Ards, NP [address] [place],  [place] [address] PCP: Ailene Ards, NP   Assessment & Plan: Visit Diagnoses:  1. Spinal stenosis, lumbar region, with neurogenic claudication   2. Radiculopathy, lumbar region   3. 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 Neurogenic claudication ([place])   5. DDD (degenerative disc disease), lumbar   6. Other spondylosis with radiculopathy, lumbar region     Plan: Avoid bending, stooping and avoid lifting weights greater than 10 lbs. Avoid prolong standing and walking. Order for a new walker with wheels. Surgery scheduling secretary Kandice Hams, will call you in the next week to schedule for surgery.  Surgery recommended is a two level lumbar decompressive central laminectomy L4-5 and L5-S1 and one level fusion L5-S1 this would be done with rods, screws and cages with local bone graft and allograft (donor bone graft). Take hydrocodone for for pain. Risk of surgery includes risk of infection 1 in 200 patients, bleeding 1/2% chance you would need a transfusion.   Risk to the nerves is one in 10,000. You will need to use a brace for 3 months and wean from the brace on the 4th month. Expect improved walking and standing tolerance. Expect relief of leg pain but numbness may persist depending on the length and degree of pressure that has been pres  Follow-Up Instructions: Return in about 4 weeks (around 03/14/2021).   Orders:  No orders of the defined types were placed in this encounter.  No orders of the defined types were placed in this encounter.     Procedures: No procedures performed   Clinical Data: Findings:  Narrative & Impression CLINICAL DATA:  Low back pain radiating into the left hip. History of a fall 1 month ago.   EXAM: MRI LUMBAR SPINE WITHOUT CONTRAST    TECHNIQUE: Multiplanar, multisequence MR imaging of the lumbar spine was performed. No intravenous contrast was administered.   COMPARISON:  CT abdomen and pelvis 01/05/2020.   FINDINGS: Segmentation:  Standard.   Alignment:  Maintained.   Vertebrae:  No fracture, evidence of discitis, or bone lesion.   Conus medullaris and cauda equina: Conus extends to the L2 level. Conus and cauda equina appear normal.   Paraspinal and other soft tissues: Bilateral renal cysts are unchanged.   Disc levels:   T11-12 and T12-L1 are imaged in the sagittal plane only and negative.   L1-2: Negative.   L2-3: Negative.   L3-4: There is some ligamentum flavum thickening. No disc bulge or protrusion. No stenosis.   L4-5: Moderate to moderately severe facet degenerative change, ligamentum flavum thickening and a shallow disc bulge cause moderate central canal stenosis. Neural foramina are open.   L5-S1: Advanced bilateral facet degenerative change is seen. There is a broad-based disc bulge and ligamentum flavum thickening. Moderate central canal stenosis is seen and there is narrowing of both subarticular recesses which could impact either descending S1 root. Moderate to moderately severe foraminal narrowing is worse on the right.   IMPRESSION: Moderate central canal stenosis at L4-5 where there is moderate to moderately severe bilateral facet degenerative disease.   Moderate central canal stenosis at L5-S1 with narrowing of both subarticular  recesses which could impact either descending S1 root. There is also right worse than left moderate to moderately severe foraminal narrowing at this level which could irritate either exiting L5 root. Advanced facet degenerative disease at this level noted.     Electronically Signed   By: Inge Rise M.D.   On: 10/22/2020 15:54       Subjective: Chief Complaint  Patient presents with   Lower Back - Pain, Follow-up    75 year old  female previously was scheduled for lumbar laminectomy surgery for severe spinal stenosis when her HgBA1c returned elevated at 8.2. Her surgery was cancelled and her primary care physician, Dr. Pearline Cables has adjusted her meds. She is feeling good and is avoiding concentrated sugars. She is falling more frequently and has fallen 3 times since the surgery was cancelled. No bowel or bladder difficulty and meds for furosemide is increased and the leg swelling is improved.     Review of Systems  Constitutional: Negative.   HENT: Negative.    Eyes: Negative.   Respiratory: Negative.    Cardiovascular: Negative.   Gastrointestinal: Negative.   Endocrine: Negative.   Genitourinary: Negative.   Musculoskeletal: Negative.   Skin: Negative.   Allergic/Immunologic: Negative.   Neurological: Negative.   Hematological: Negative.   Psychiatric/Behavioral: Negative.      Objective: Vital Signs: BP (!) 150/88 (BP Location: Right Arm, Patient Position: Sitting)   Pulse 84   Ht '5\' 2"'$  (1.575 m)   Wt 185 lb 3.2 oz (84 kg)   BMI 33.87 kg/m   Physical Exam Constitutional:      Appearance: She is well-developed.  HENT:     Head: Normocephalic and atraumatic.  Eyes:     Pupils: Pupils are equal, round, and reactive to light.  Pulmonary:     Effort: Pulmonary effort is normal.     Breath sounds: Normal breath sounds.  Abdominal:     General: Bowel sounds are normal.     Palpations: Abdomen is soft.  Musculoskeletal:     Cervical back: Normal range of motion and neck supple.     Lumbar back: Negative right straight leg raise test and negative left straight leg raise test.  Skin:    General: Skin is warm and dry.  Neurological:     Mental Status: She is alert and oriented to person, place, and time.  Psychiatric:        Behavior: Behavior normal.        Thought Content: Thought content normal.        Judgment: Judgment normal.    Back Exam   Tenderness  The patient is experiencing tenderness  in the lumbar.  Range of Motion  Extension:  abnormal  Flexion:  abnormal  Lateral bend right:  abnormal  Lateral bend left:  abnormal  Rotation right:  abnormal  Rotation left:  abnormal   Muscle Strength  Right Quadriceps:  4/5  Left Quadriceps:  4/5  Right Hamstrings:  5/5  Left Hamstrings:  5/5   Tests  Straight leg raise right: negative Straight leg raise left: negative  Reflexes  Patellar:  0/4 Achilles:  0/4 Biceps:  2/4 Babinski's sign: normal   Other  Heel walk: normal Sensation: decreased Gait: antalgic  Erythema: no back redness Scars: absent  Comments:  Weak in knee extension and bilateral foot DF.      Specialty Comments:  No specialty comments available.  Imaging: No results found.   PMFS History: Patient Active Problem List  Diagnosis Date Noted   Cerebrovascular disease 12/20/2020   GERD (gastroesophageal reflux disease) 11/18/2020   Type 2 diabetes mellitus with stage 3b chronic kidney disease, without long-term current use of insulin ([place]) 07/12/2020   Mixed hyperlipidemia 07/12/2020   Essential hypertension, benign 07/12/2020   Iron deficiency anemia    Loss of weight 04/09/2020   Dyspepsia 01/28/2020   Constipation 01/28/2020   Lower abdominal pain 01/28/2020   Normocytic anemia 01/28/2020   Past Medical History:  Diagnosis Date   Acid reflux disease    Anemia    Breast cancer (HCC)    left side   Chronic back pain    Chronic kidney disease    Diabetes mellitus without complication (HCC)    Diverticulitis    Hypertension    Lumbosacral spondylosis    Per previous PCP records   Sleep apnea    Thyroid disease     Family History  Problem Relation Age of Onset   Diabetes Mother    Thyroid disease Mother    Colon cancer Neg Hx     Past Surgical History:  Procedure Laterality Date   ABDOMINAL HYSTERECTOMY     BIOPSY  02/19/2020   Procedure: BIOPSY;  Surgeon: Eloise Harman, DO;  Location: [facility];  Service:  Endoscopy;;   BREAST LUMPECTOMY Left 2003   BREAST SURGERY     COLONOSCOPY WITH PROPOFOL N/A 02/19/2020   non-bleeding internal hemorrhoids, many small-mouthed diverticula in entire colon.   ESOPHAGOGASTRODUODENOSCOPY (EGD) WITH PROPOFOL N/A 02/19/2020    benign-appearing esophageal stenosis s/p dilation, gastritis s/p biopsy, normal duodenum. Negative H.pylori, negative duodenal biopsy.    GIVENS CAPSULE STUDY N/A 06/14/2020   Procedure: GIVENS CAPSULE STUDY;  Surgeon: Eloise Harman, DO;  Location: [facility];  Service: Endoscopy;  Laterality: N/A;  7:30am   THYROIDECTOMY     tummy tuck     at time of breast surgery 2003   Social History   Occupational History   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 [place]  Tobacco Use   Smoking status: Never   Smokeless tobacco: Never  Vaping Use   Vaping Use: Never used  Substance and Sexual Activity   Alcohol use: Not Currently   Drug use: Not Currently   Sexual activity: Not on file

## 2021-02-18 ENCOUNTER — Telehealth: Payer: Self-pay | Admitting: Specialist

## 2021-02-18 NOTE — Telephone Encounter (Signed)
Patient called. Says she is suppose to have blood work done but has not heard from anyone to schedule. Her call back number is [phone]

## 2021-02-22 NOTE — Telephone Encounter (Signed)
I tried to call patient back but there was no answer.  Per Dr. Louanne Skye he was going to have them draw it the day of surgery at the hospital. (Ha1c can only be drawn 1 time every 3 months, last draw was 01/10/21)

## 2021-02-23 NOTE — Telephone Encounter (Signed)
Sherrie spoke with patient and advised

## 2021-03-01 ENCOUNTER — Other Ambulatory Visit: Payer: Self-pay

## 2021-03-01 ENCOUNTER — Ambulatory Visit: Payer: Medicare Other | Attending: Family | Admitting: Neurology

## 2021-03-01 DIAGNOSIS — G4730 Sleep apnea, unspecified: Secondary | ICD-10-CM | POA: Diagnosis present

## 2021-03-01 DIAGNOSIS — G4733 Obstructive sleep apnea (adult) (pediatric): Secondary | ICD-10-CM | POA: Insufficient documentation

## 2021-03-01 DIAGNOSIS — R5383 Other fatigue: Secondary | ICD-10-CM

## 2021-03-02 ENCOUNTER — Other Ambulatory Visit: Payer: Self-pay

## 2021-03-03 ENCOUNTER — Other Ambulatory Visit: Payer: Self-pay

## 2021-03-03 ENCOUNTER — Ambulatory Visit (INDEPENDENT_AMBULATORY_CARE_PROVIDER_SITE_OTHER): Payer: Medicare Other | Admitting: Surgery

## 2021-03-03 ENCOUNTER — Encounter: Payer: Self-pay | Admitting: Surgery

## 2021-03-03 ENCOUNTER — Telehealth: Payer: Self-pay | Admitting: *Deleted

## 2021-03-03 VITALS — BP 138/83 | HR 75

## 2021-03-03 DIAGNOSIS — M4317 Spondylolisthesis, lumbosacral region: Secondary | ICD-10-CM

## 2021-03-03 DIAGNOSIS — M48062 Spinal stenosis, lumbar region with neurogenic claudication: Secondary | ICD-10-CM

## 2021-03-03 NOTE — Telephone Encounter (Signed)
Alley w/ [facility] is calling for clarification on a prescription(Gabapentin-100 mg 2 tablets once daily )previously on 300 mg daily. Was  this an intentional strength decrease? Please advise /call: [phone]. Medication has been transferred from [facility] and they are trying to update their system.

## 2021-03-04 NOTE — Telephone Encounter (Signed)
Returned call to [facility] , no answer, left vmessage concerning clarification of medication(Gabapentin-300 mg daily)is what doctor is wanting listed.

## 2021-03-07 NOTE — Procedures (Signed)
[place] A. Merlene Laughter, MD     [email or web address]             NOCTURNAL POLYSOMNOGRAPHY   LOCATION: [facility]  Patient Name: Stephanie Moreno, Stephanie Moreno Date: 03/01/2021 Gender: Female D.O.B: [birth year] Age (years): 2 Referring Provider: Novella Rob FNP Height (inches): 62 Interpreting Physician: Phillips Odor MD, ABSM Weight (lbs): 185 RPSGT: Rosebud Poles BMI: 34 MRN: [id number] Neck Size: 16.00 CLINICAL INFORMATION Sleep Study Type: NPSG     Indication for sleep study: Fatigue     Epworth Sleepiness Score:     SLEEP STUDY TECHNIQUE As per the AASM Manual for the Scoring of Sleep and Associated Events v2.3 (April 2016) with a hypopnea requiring 4% desaturations.  The channels recorded and monitored were frontal, central and occipital EEG, electrooculogram (EOG), submentalis EMG (chin), nasal and oral airflow, thoracic and abdominal wall motion, anterior tibialis EMG, snore microphone, electrocardiogram, and pulse oximetry.  MEDICATIONS Medications self-administered by patient taken the night of the study : N/A  Current Outpatient Medications:    acetaminophen (TYLENOL) 500 MG tablet, Take 1,000 mg by mouth every 8 (eight) hours as needed for moderate pain., Disp: , Rfl:    amitriptyline (ELAVIL) 50 MG tablet, Take 50 mg by mouth at bedtime., Disp: , Rfl:    amLODipine (NORVASC) 5 MG tablet, Take 5 mg by mouth daily., Disp: , Rfl:    beclomethasone (QVAR) 80 MCG/ACT inhaler, Inhale 1 puff into the lungs 2 (two) times daily as needed (shortness of breath)., Disp: , Rfl:    Blood Glucose Monitoring Suppl (ACCU-CHEK GUIDE ME) w/Device KIT, 1 Piece by Does not apply route as directed., Disp: 1 kit, Rfl: 0   cyclobenzaprine (FLEXERIL) 10 MG tablet, Take 10 mg by mouth 3 (three) times daily as needed for muscle spasms., Disp: , Rfl:    docusate sodium (COLACE) 100 MG capsule, TAKE 1 CAPSULE BY MOUTH TWICE A DAY, Disp: 60 capsule, Rfl: 3   doxycycline  (VIBRA-TABS) 100 MG tablet, Take 1 tablet (100 mg total) by mouth 2 (two) times daily., Disp: 20 tablet, Rfl: 0   Dulaglutide (TRULICITY) 3 VQ/0.0QQ SOPN, Inject 3 mg as directed once a week., Disp: 2 mL, Rfl: 3   FLOVENT HFA 220 MCG/ACT inhaler, Inhale 1 puff into the lungs daily as needed for wheezing., Disp: , Rfl:    furosemide (LASIX) 40 MG tablet, Take 40-80 mg by mouth See admin instructions. Takes 40 mg every other day. On opposite day she takes 80 mg tab, Disp: , Rfl:    gabapentin (NEURONTIN) 300 MG capsule, Take 300 mg by mouth at bedtime., Disp: , Rfl:    glipiZIDE (GLUCOTROL XL) 5 MG 24 hr tablet, TAKE 1 TABLET BY MOUTH EVERY DAY WITH BREAKFAST, Disp: 90 tablet, Rfl: 0   GLUCOSAMINE-CHONDROITIN PO, Take 1 tablet by mouth daily., Disp: , Rfl:    glucose blood test strip, 1 each by Other route 3 (three) times daily. Dx: E11.65, Disp: 100 each, Rfl: 5   insulin degludec (TRESIBA FLEXTOUCH) 100 UNIT/ML FlexTouch Pen, Inject 40 Units into the skin daily., Disp: 15 mL, Rfl: 2   Insulin Pen Needle (B-D ULTRAFINE III SHORT PEN) 31G X 8 MM MISC, 1 each by Does not apply route as directed., Disp: 100 each, Rfl: 3   Lancets (ONETOUCH DELICA PLUS [id number]) MISC, Use to check glucose twice daily, Disp: 100 each, Rfl: 11   lidocaine (LIDODERM) 5 %, Place 1 patch onto the skin daily as needed (pain). Remove & Discard  patch within 12 hours or as directed by MD. Applied to lower back/shoulders/neck for pain, Disp: , Rfl:    linaclotide (LINZESS) 145 MCG CAPS capsule, Take 1 capsule (145 mcg total) by mouth daily before breakfast., Disp: 30 capsule, Rfl: 5   losartan (COZAAR) 50 MG tablet, Take 100 mg by mouth daily., Disp: , Rfl:    magnesium oxide (MAG-OX) 400 MG tablet, Take 400 mg by mouth daily., Disp: , Rfl:    meclizine (ANTIVERT) 25 MG tablet, Take 25 mg by mouth 3 (three) times daily as needed for dizziness., Disp: , Rfl:    metoprolol tartrate (LOPRESSOR) 50 MG tablet, Take 50 mg by mouth 2  (two) times daily., Disp: , Rfl:    Naphazoline HCl (CLEAR EYES OP), Place 1 drop into both eyes daily as needed (dry eyes)., Disp: , Rfl:    pantoprazole (PROTONIX) 40 MG tablet, Take 1 tablet (40 mg total) by mouth daily. 30 minutes before breakfast, Disp: 90 tablet, Rfl: 3   phenazopyridine (PYRIDIUM) 200 MG tablet, Take 1 tablet (200 mg total) by mouth 3 (three) times daily., Disp: 6 tablet, Rfl: 0   polyethylene glycol (MIRALAX / GLYCOLAX) 17 g packet, Take 17 g by mouth daily as needed for moderate constipation., Disp: , Rfl:    rosuvastatin (CRESTOR) 10 MG tablet, Take 10 mg by mouth at bedtime. , Disp: , Rfl:    verapamil (CALAN-SR) 180 MG CR tablet, Take 180 mg by mouth daily., Disp: , Rfl:      SLEEP ARCHITECTURE The study was initiated at 11:14:40 PM and ended at 5:55:21 AM.  Sleep onset time was 69.8 minutes and the sleep efficiency was 30.1%. The total sleep time was 120.5 minutes.  Stage REM latency was 318.5 minutes.  The patient spent 28.22% of the night in stage N1 sleep, 65.15% in stage N2 sleep, 3.32% in stage N3 and 3.3% in REM.  Alpha intrusion was absent.  Supine sleep was 94.67%.  RESPIRATORY PARAMETERS The overall apnea/hypopnea index (AHI) was 33.4 per hour. There were 8 total apneas, including 8 obstructive, 0 central and 0 mixed apneas. There were 59 hypopneas and 0 RERAs.  The AHI during Stage REM sleep was 45.0 per hour.  AHI while supine was 34.7 per hour.  The mean oxygen saturation was 94.03%. The minimum SpO2 during sleep was 78.00%.  moderate snoring was noted during this study.  CARDIAC DATA The 2 lead EKG demonstrated sinus rhythm. The mean heart rate was 63.06 beats per minute. Other EKG findings include: PVCs.  LEG MOVEMENT DATA The total PLMS were 0 with a resulting PLMS index of 0.00. Associated arousal with leg movement index was 0.0.  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 AutoPAP 8-12 is  recommended. Markedly reduced sleep efficiency is also noted.   Delano Metz, MD Diplomate, American Board of Sleep Medicine.  ELECTRONICALLY SIGNED ON:  03/07/2021, 7:02 PM [place] PH: [phone]   FX: [phone] [place]

## 2021-03-11 ENCOUNTER — Other Ambulatory Visit: Payer: Self-pay

## 2021-03-11 ENCOUNTER — Encounter (HOSPITAL_COMMUNITY)
Admission: RE | Admit: 2021-03-11 | Discharge: 2021-03-11 | Disposition: A | Discharge status: Home / Self Care | Payer: Medicare Other | Source: Ambulatory Visit | Attending: Specialist | Admitting: Specialist

## 2021-03-11 ENCOUNTER — Ambulatory Visit (HOSPITAL_COMMUNITY)
Admission: RE | Admit: 2021-03-11 | Discharge: 2021-03-11 | Disposition: A | Discharge status: Home / Self Care | Payer: Medicare Other | Source: Ambulatory Visit | Attending: Surgery | Admitting: Surgery

## 2021-03-11 ENCOUNTER — Encounter (HOSPITAL_COMMUNITY): Payer: Self-pay

## 2021-03-11 DIAGNOSIS — Z01818 Encounter for other preprocedural examination: Secondary | ICD-10-CM | POA: Insufficient documentation

## 2021-03-11 DIAGNOSIS — G4733 Obstructive sleep apnea (adult) (pediatric): Secondary | ICD-10-CM | POA: Insufficient documentation

## 2021-03-11 DIAGNOSIS — Z20822 Contact with and (suspected) exposure to covid-19: Secondary | ICD-10-CM | POA: Insufficient documentation

## 2021-03-11 DIAGNOSIS — E118 Type 2 diabetes mellitus with unspecified complications: Secondary | ICD-10-CM | POA: Insufficient documentation

## 2021-03-11 HISTORY — DX: Anxiety disorder, unspecified: F41.9

## 2021-03-11 HISTORY — DX: Unspecified asthma, uncomplicated: J45.909

## 2021-03-11 LAB — COMPREHENSIVE METABOLIC PANEL
ALT: 26 U/L (ref 0–44)
AST: 37 U/L (ref 15–41)
Albumin: 3.7 g/dL (ref 3.5–5.0)
Alkaline Phosphatase: 75 U/L (ref 38–126)
Anion gap: 10 (ref 5–15)
BUN: 18 mg/dL (ref 8–23)
CO2: 19 mmol/L — ABNORMAL LOW (ref 22–32)
Calcium: 9.3 mg/dL (ref 8.9–10.3)
Chloride: 106 mmol/L (ref 98–111)
Creatinine, Ser: 1.34 mg/dL — ABNORMAL HIGH (ref 0.44–1.00)
GFR, Estimated: 41 mL/min — ABNORMAL LOW (ref >60)
Glucose, Bld: 139 mg/dL — ABNORMAL HIGH (ref 70–99)
Potassium: 3.9 mmol/L (ref 3.5–5.1)
Sodium: 135 mmol/L (ref 135–145)
Total Bilirubin: 0.6 mg/dL (ref 0.3–1.2)
Total Protein: 8.2 g/dL — ABNORMAL HIGH (ref 6.5–8.1)

## 2021-03-11 LAB — CBC
HCT: 41.9 % (ref 36.0–46.0)
Hemoglobin: 13.3 g/dL (ref 12.0–15.0)
MCH: 28 pg (ref 26.0–34.0)
MCHC: 31.7 g/dL (ref 30.0–36.0)
MCV: 88.2 fL (ref 80.0–100.0)
Platelets: 272 10*3/uL (ref 150–400)
RBC: 4.75 MIL/uL (ref 3.87–5.11)
RDW: 13.7 % (ref 11.5–15.5)
WBC: 7 10*3/uL (ref 4.0–10.5)
nRBC: 0 % (ref 0.0–0.2)

## 2021-03-11 LAB — URINALYSIS, ROUTINE W REFLEX MICROSCOPIC
Bilirubin Urine: NEGATIVE
Glucose, UA: NEGATIVE mg/dL
Ketones, ur: NEGATIVE mg/dL
Nitrite: NEGATIVE
Protein, ur: NEGATIVE mg/dL
Specific Gravity, Urine: <1.005 — ABNORMAL LOW (ref 1.005–1.030)
pH: 6 (ref 5.0–8.0)

## 2021-03-11 LAB — TYPE AND SCREEN
ABO/RH(D): O POS
Antibody Screen: NEGATIVE

## 2021-03-11 LAB — PROTIME-INR
INR: 1 (ref 0.8–1.2)
Prothrombin Time: 13.7 seconds (ref 11.4–15.2)

## 2021-03-11 LAB — SARS CORONAVIRUS 2 (TAT 6-24 HRS): SARS Coronavirus 2: NEGATIVE

## 2021-03-11 LAB — SURGICAL PCR SCREEN
MRSA, PCR: NEGATIVE
Staphylococcus aureus: NEGATIVE

## 2021-03-11 LAB — URINALYSIS, MICROSCOPIC (REFLEX)
RBC / HPF: NONE SEEN RBC/hpf (ref 0–5)
Squamous Epithelial / HPF: NONE SEEN (ref 0–5)

## 2021-03-11 LAB — GLUCOSE, CAPILLARY: Glucose-Capillary: 152 mg/dL — ABNORMAL HIGH (ref 70–99)

## 2021-03-11 NOTE — Pre-Procedure Instructions (Signed)
Surgical Instructions    Your procedure is scheduled on Tuesday, March 15, 2021 at 7:30 AM.  Report to [facility] Main Entrance "A" at 5:30 A.M., then check in with the Admitting office.  Call this number if you have problems the morning of surgery:  [phone]   If you have any questions prior to your surgery date call [phone]: Open Monday-Friday 8am-4pm    Remember:  Do not eat after midnight the night before your surgery  You may drink clear liquids until 4:30 AM the morning of your surgery.   Clear liquids allowed are: Water, Non-Citrus Juices (without pulp), Carbonated Beverages, Clear Tea, Black Coffee ONLY (NO MILK, CREAM OR POWDERED CREAMER of any kind), and Gatorade  Please complete your 8 OZ WATER that was provided to you by 4:30 AM the morning of surgery.     Take these medicines the morning of surgery with A SIP OF WATER:  amLODipine (NORVASC) linaclotide (LINZESS) metoprolol tartrate (LOPRESSOR) sucralfate (CARAFATE) verapamil (CALAN-SR) Vibegron (GEMTESA)   IF NEEDED: acetaminophen (TYLENOL)  beclomethasone (QVAR) cyclobenzaprine (FLEXERIL) fluticasone (FLONASE) meclizine (ANTIVERT) Naphazoline HCl (CLEAR EYES OP) traMADol (ULTRAM)    Please bring all beclomethasone (QVAR) inhaler with you the day of surgery.    As of today, STOP taking any Aspirin (unless otherwise instructed by your surgeon) Aleve, Naproxen, Ibuprofen, Motrin, Advil, Goody's, BC's, all herbal medications, fish oil, and all vitamins.   WHAT DO I DO ABOUT MY DIABETES MEDICATION?   Do not take glipiZIDE (GLUCOTROL XL) the evening before surgery or the morning of surgery,  THE NIGHT BEFORE SURGERY, take ___20________ units of __insulin degludec (TRESIBA FLEXTOUCH) insulin.      The day of surgery, do not take Dulaglutide (TRULICITY).  If your CBG is greater than 220 mg/dL, you may take 20 units of your insulin degludec (TRESIBA FLEXTOUCH)   HOW TO MANAGE YOUR  DIABETES BEFORE AND AFTER SURGERY  Why is it important to control my blood sugar before and after surgery? Improving blood sugar levels before and after surgery helps healing and can limit problems. A way of improving blood sugar control is eating a healthy diet by:  Eating less sugar and carbohydrates  Increasing activity/exercise  Talking with your doctor about reaching your blood sugar goals High blood sugars (greater than 180 mg/dL) can raise your risk of infections and slow your recovery, so you will need to focus on controlling your diabetes during the weeks before surgery. Make sure that the doctor who takes care of your diabetes knows about your planned surgery including the date and location.  How do I manage my blood sugar before surgery? Check your blood sugar at least 4 times a day, starting 2 days before surgery, to make sure that the level is not too high or low.  Check your blood sugar the morning of your surgery when you wake up and every 2 hours until you get to the Short Stay unit.  If your blood sugar is less than 70 mg/dL, you will need to treat for low blood sugar: Do not take insulin. Treat a low blood sugar (less than 70 mg/dL) with  cup of clear juice (cranberry or apple), 4 glucose tablets, OR glucose gel. Recheck blood sugar in 15 minutes after treatment (to make sure it is greater than 70 mg/dL). If your blood sugar is not greater than 70 mg/dL on recheck, call [phone] for further instructions. Report your blood sugar to the short stay nurse when you get to Short  Stay.  If you are admitted to the hospital after surgery: Your blood sugar will be checked by the staff and you will probably be given insulin after surgery (instead of oral diabetes medicines) to make sure you have good blood sugar levels. The goal for blood sugar control after surgery is 80-180 mg/dL.           Do not wear jewelry or makeup Do not wear lotions, powders, perfumes, or  deodorant. Do not shave 48 hours prior to surgery. Do not bring valuables to the hospital. DO Not wear nail polish, gel polish, artificial nails, or any other type of covering on natural nails including finger and toenails. If patients have artificial nails, gel coating, etc. that need to be removed by a nail salon please have this removed prior to surgery or surgery may need to be canceled/delayed if the surgeon/ anesthesia feels like the patient is unable to be adequately monitored.             [facility] is not responsible for any belongings or valuables.  Do NOT Smoke (Tobacco/Vaping)  24 hours prior to your procedure If you use a CPAP at night, you may bring your mask for your overnight stay.   Contacts, glasses, dentures or bridgework may not be worn into surgery, please bring cases for these belongings   For patients admitted to the hospital, discharge time will be determined by your treatment team.   Patients discharged the day of surgery will not be allowed to drive home, and someone needs to stay with them for 24 hours.  ONLY 1 SUPPORT PERSON MAY BE PRESENT WHILE YOU ARE IN SURGERY. IF YOU ARE TO BE ADMITTED ONCE YOU ARE IN YOUR ROOM YOU WILL BE ALLOWED TWO (2) VISITORS.  Minor children may have two parents present. Special consideration for safety and communication needs will be reviewed on a case by case basis.  Special instructions:    Oral Hygiene is also important to reduce your risk of infection.  Remember - BRUSH YOUR TEETH THE MORNING OF SURGERY WITH YOUR REGULAR TOOTHPASTE   [place]- Preparing For Surgery  Before surgery, you can play an important role. Because skin is not sterile, your skin needs to be as free of germs as possible. You can reduce the number of germs on your skin by washing with CHG (chlorahexidine gluconate) Soap before surgery.  CHG is an antiseptic cleaner which kills germs and bonds with the skin to continue killing germs even after washing.      Please do not use if you have an allergy to CHG or antibacterial soaps. If your skin becomes reddened/irritated stop using the CHG.  Do not shave (including legs and underarms) for at least 48 hours prior to first CHG shower. It is OK to shave your face.  Please follow these instructions carefully.     Shower the NIGHT BEFORE SURGERY and the MORNING OF SURGERY with CHG Soap.   If you chose to wash your hair, wash your hair first as usual with your normal shampoo. After you shampoo, rinse your hair and body thoroughly to remove the shampoo.  Then [employer] and genitals (private parts) with your normal soap and rinse thoroughly to remove soap.  After that Use CHG Soap as you would any other liquid soap. You can apply CHG directly to the skin and wash gently with a scrungie or a clean washcloth.   Apply the CHG Soap to your body ONLY FROM THE  NECK DOWN.  Do not use on open wounds or open sores. Avoid contact with your eyes, ears, mouth and genitals (private parts). Wash Face and genitals (private parts)  with your normal soap.   Wash thoroughly, paying special attention to the area where your surgery will be performed.  Thoroughly rinse your body with warm water from the neck down.  DO NOT shower/wash with your normal soap after using and rinsing off the CHG Soap.  Pat yourself dry with a CLEAN TOWEL.  Wear CLEAN PAJAMAS to bed the night before surgery  Place CLEAN SHEETS on your bed the night before your surgery  DO NOT SLEEP WITH PETS.   Day of Surgery:  Take a shower with CHG soap. Wear Clean/Comfortable clothing the morning of surgery Do not apply any deodorants/lotions.   Remember to brush your teeth WITH YOUR REGULAR TOOTHPASTE.   Please read over the following fact sheets that you were given.

## 2021-03-11 NOTE — Progress Notes (Signed)
PCP - Jeralyn Ruths Cardiologist - Denies Nephrologist: Dr. Ulice Bold  PPM/ICD - Denies  Chest x-ray - 03/11/21 EKG - 12/15/20 Stress Test - Denies ECHO - 12/20/20 Cardiac Cath - Denies  Sleep Study - Yes, Patient has OSA. Does not currently use CPAP.  Fasting Blood Sugar - 114 Checks Blood Sugar __3___ times a day  Blood Thinner Instructions: N/A Aspirin Instructions: Patient will follow up with surgeon's office about stopping ASA.  ERAS Protcol - Yes, water  COVID TEST- 03/11/21 in PAT   Anesthesia review: Yes, previous EKG abnormal  Patient denies shortness of breath, fever, cough and chest pain at PAT appointment   All instructions explained to the patient, with a verbal understanding of the material. Patient agrees to go over the instructions while at home for a better understanding. Patient also instructed to self quarantine after being tested for COVID-19. The opportunity to ask questions was provided.

## 2021-03-14 ENCOUNTER — Telehealth: Payer: Self-pay | Admitting: Nurse Practitioner

## 2021-03-14 ENCOUNTER — Other Ambulatory Visit: Payer: Self-pay

## 2021-03-14 DIAGNOSIS — E1122 Type 2 diabetes mellitus with diabetic chronic kidney disease: Secondary | ICD-10-CM

## 2021-03-14 NOTE — Telephone Encounter (Signed)
Pt is calling and requesting a refill on her medication and to be sent to [facility]  Dulaglutide (TRULICITY) 3 [phone] SOPN

## 2021-03-14 NOTE — Anesthesia Preprocedure Evaluation (Addendum)
Anesthesia Evaluation  Patient identified by MRN, date of birth, ID band Patient awake    Reviewed: Allergy & Precautions, H&P , NPO status , Patient's Chart, lab work & pertinent test results  Airway Mallampati: I  TM Distance: >3 FB Neck ROM: Full    Dental no notable dental hx. (+) Edentulous Upper, Edentulous Lower, Dental Advisory Given   Pulmonary asthma , sleep apnea ,    Pulmonary exam normal breath sounds clear to auscultation       Cardiovascular Exercise Tolerance: Good hypertension, Pt. on medications and Pt. on home beta blockers Normal cardiovascular exam Rhythm:Regular Rate:Normal  ECHO 6/22 IMPRESSIONS    1. Left ventricular ejection fraction, by estimation, is 65 to 70%. The  left ventricle has normal function. The left ventricle has no regional  wall motion abnormalities. Left ventricular diastolic parameters are  indeterminate.  2. Right ventricular systolic function is normal. The right ventricular  size is normal.  3. Trivial mitral valve regurgitation.  4. The aortic valve is normal in structure. Aortic valve regurgitation is  not visualized   Neuro/Psych Anxiety negative neurological ROS  negative psych ROS   GI/Hepatic Neg liver ROS, GERD  Medicated and Controlled,  Endo/Other  diabetes, Type 2  Renal/GU CRFRenal disease  negative genitourinary   Musculoskeletal  (+) Arthritis ,   Abdominal   Peds negative pediatric ROS (+)  Hematology  (+) Blood dyscrasia, anemia ,   Anesthesia Other Findings   Reproductive/Obstetrics negative OB ROS                         Anesthesia Physical Anesthesia Plan  ASA: 4  Anesthesia Plan: General   Post-op Pain Management:    Induction: Intravenous  PONV Risk Score and Plan: 3 and Ondansetron and Treatment may vary due to age or medical condition  Airway Management Planned: Oral ETT  Additional Equipment: Arterial  line  Intra-op Plan:   Post-operative Plan: Extubation in OR  Informed Consent: I have reviewed the patients History and Physical, chart, labs and discussed the procedure including the risks, benefits and alternatives for the proposed anesthesia with the patient or authorized representative who has indicated his/her understanding and acceptance.       Plan Discussed with: Anesthesiologist and CRNA  Anesthesia Plan Comments: (PAT note by Karoline Caldwell, PA-C: Pt previously had medical clearance from PCP Dr. Hurshel Party dated 12/27/20. Surgery was subsequently postponed due to uncontrolled DM2.  She has been seen by her PCP and her medications titrated.  Recent diagnosis of moderate OSA, not on CPAP yet.  Preop labs reviewed, creatinine mildly elevated 1.34, otherwise unremarkable.  EKG 12/15/20: Sinus  Rhythm. Rate 72. Left atrial enlargement.   TTE 12/20/20: 1. Left ventricular ejection fraction, by estimation, is 65 to 70%. The  left ventricle has normal function. The left ventricle has no regional  wall motion abnormalities. Left ventricular diastolic parameters are  indeterminate.  2. Right ventricular systolic function is normal. The right ventricular  size is normal.  3. Trivial mitral valve regurgitation.  4. The aortic valve is normal in structure. Aortic valve regurgitation is  not visualized.   ART line vs Clear sight)     Anesthesia Quick Evaluation

## 2021-03-14 NOTE — Progress Notes (Addendum)
Anesthesia Chart Review:  Pt previously had medical clearance from PCP Dr. Hurshel Party dated 12/27/20. Surgery was subsequently postponed due to uncontrolled DM2.  She has been seen by her PCP and her medications titrated.  Recent diagnosis of moderate OSA, not on CPAP yet.  Preop labs reviewed, creatinine mildly elevated 1.34, otherwise unremarkable.  EKG 12/15/20: Sinus  Rhythm. Rate 72. Left atrial enlargement.   TTE 12/20/20:  1. Left ventricular ejection fraction, by estimation, is 65 to 70%. The  left ventricle has normal function. The left ventricle has no regional  wall motion abnormalities. Left ventricular diastolic parameters are  indeterminate.   2. Right ventricular systolic function is normal. The right ventricular  size is normal.   3. Trivial mitral valve regurgitation.   4. The aortic valve is normal in structure. Aortic valve regurgitation is  not visualized.    Wynonia Musty [facility] Short Stay Center/Anesthesiology Phone [phone] 03/14/2021 9:05 AM

## 2021-03-15 ENCOUNTER — Inpatient Hospital Stay (HOSPITAL_COMMUNITY): Payer: Medicare Other | Admitting: Anesthesiology

## 2021-03-15 ENCOUNTER — Inpatient Hospital Stay (HOSPITAL_COMMUNITY): Payer: Medicare Other | Admitting: Physician Assistant

## 2021-03-15 ENCOUNTER — Inpatient Hospital Stay (HOSPITAL_COMMUNITY): Payer: Medicare Other

## 2021-03-15 ENCOUNTER — Other Ambulatory Visit: Payer: Self-pay

## 2021-03-15 ENCOUNTER — Inpatient Hospital Stay (HOSPITAL_COMMUNITY)
Admission: RE | Admit: 2021-03-15 | Discharge: 2021-03-20 | DRG: 459 | Disposition: A | Discharge status: Rehabilitation Facility | Payer: Medicare Other | Attending: Specialist | Admitting: Specialist

## 2021-03-15 ENCOUNTER — Encounter (HOSPITAL_COMMUNITY): Payer: Self-pay | Admitting: Specialist

## 2021-03-15 ENCOUNTER — Encounter (HOSPITAL_COMMUNITY)
Admission: RE | Disposition: A | Discharge status: Rehabilitation Facility | Payer: Self-pay | Source: Home / Self Care | Attending: Specialist

## 2021-03-15 DIAGNOSIS — Z833 Family history of diabetes mellitus: Secondary | ICD-10-CM

## 2021-03-15 DIAGNOSIS — Z853 Personal history of malignant neoplasm of breast: Secondary | ICD-10-CM | POA: Diagnosis not present

## 2021-03-15 DIAGNOSIS — E782 Mixed hyperlipidemia: Secondary | ICD-10-CM | POA: Diagnosis present

## 2021-03-15 DIAGNOSIS — K219 Gastro-esophageal reflux disease without esophagitis: Secondary | ICD-10-CM | POA: Diagnosis present

## 2021-03-15 DIAGNOSIS — E890 Postprocedural hypothyroidism: Secondary | ICD-10-CM | POA: Diagnosis present

## 2021-03-15 DIAGNOSIS — R1310 Dysphagia, unspecified: Secondary | ICD-10-CM | POA: Diagnosis not present

## 2021-03-15 DIAGNOSIS — J9811 Atelectasis: Secondary | ICD-10-CM | POA: Diagnosis not present

## 2021-03-15 DIAGNOSIS — G928 Other toxic encephalopathy: Secondary | ICD-10-CM | POA: Diagnosis not present

## 2021-03-15 DIAGNOSIS — Z880 Allergy status to penicillin: Secondary | ICD-10-CM | POA: Diagnosis not present

## 2021-03-15 DIAGNOSIS — M4807 Spinal stenosis, lumbosacral region: Secondary | ICD-10-CM | POA: Diagnosis present

## 2021-03-15 DIAGNOSIS — I129 Hypertensive chronic kidney disease with stage 1 through stage 4 chronic kidney disease, or unspecified chronic kidney disease: Secondary | ICD-10-CM | POA: Diagnosis not present

## 2021-03-15 DIAGNOSIS — M48062 Spinal stenosis, lumbar region with neurogenic claudication: Secondary | ICD-10-CM | POA: Diagnosis not present

## 2021-03-15 DIAGNOSIS — Z888 Allergy status to other drugs, medicaments and biological substances status: Secondary | ICD-10-CM | POA: Diagnosis not present

## 2021-03-15 DIAGNOSIS — E1122 Type 2 diabetes mellitus with diabetic chronic kidney disease: Secondary | ICD-10-CM | POA: Diagnosis not present

## 2021-03-15 DIAGNOSIS — E861 Hypovolemia: Secondary | ICD-10-CM | POA: Diagnosis not present

## 2021-03-15 DIAGNOSIS — K9189 Other postprocedural complications and disorders of digestive system: Secondary | ICD-10-CM | POA: Diagnosis not present

## 2021-03-15 DIAGNOSIS — Z885 Allergy status to narcotic agent status: Secondary | ICD-10-CM | POA: Diagnosis not present

## 2021-03-15 DIAGNOSIS — R479 Unspecified speech disturbances: Secondary | ICD-10-CM | POA: Diagnosis not present

## 2021-03-15 DIAGNOSIS — G4733 Obstructive sleep apnea (adult) (pediatric): Secondary | ICD-10-CM | POA: Diagnosis present

## 2021-03-15 DIAGNOSIS — K567 Ileus, unspecified: Secondary | ICD-10-CM | POA: Diagnosis not present

## 2021-03-15 DIAGNOSIS — Z23 Encounter for immunization: Secondary | ICD-10-CM | POA: Diagnosis not present

## 2021-03-15 DIAGNOSIS — M4317 Spondylolisthesis, lumbosacral region: Secondary | ICD-10-CM | POA: Diagnosis present

## 2021-03-15 DIAGNOSIS — D509 Iron deficiency anemia, unspecified: Secondary | ICD-10-CM | POA: Diagnosis present

## 2021-03-15 DIAGNOSIS — N1832 Chronic kidney disease, stage 3b: Secondary | ICD-10-CM | POA: Diagnosis present

## 2021-03-15 DIAGNOSIS — I10 Essential (primary) hypertension: Secondary | ICD-10-CM | POA: Diagnosis present

## 2021-03-15 DIAGNOSIS — R062 Wheezing: Secondary | ICD-10-CM | POA: Diagnosis not present

## 2021-03-15 DIAGNOSIS — Z794 Long term (current) use of insulin: Secondary | ICD-10-CM

## 2021-03-15 DIAGNOSIS — Z79899 Other long term (current) drug therapy: Secondary | ICD-10-CM

## 2021-03-15 DIAGNOSIS — R0902 Hypoxemia: Secondary | ICD-10-CM

## 2021-03-15 DIAGNOSIS — D62 Acute posthemorrhagic anemia: Secondary | ICD-10-CM | POA: Diagnosis not present

## 2021-03-15 DIAGNOSIS — R4781 Slurred speech: Secondary | ICD-10-CM | POA: Diagnosis not present

## 2021-03-15 DIAGNOSIS — R1312 Dysphagia, oropharyngeal phase: Secondary | ICD-10-CM

## 2021-03-15 DIAGNOSIS — J45909 Unspecified asthma, uncomplicated: Secondary | ICD-10-CM | POA: Diagnosis present

## 2021-03-15 DIAGNOSIS — Z8349 Family history of other endocrine, nutritional and metabolic diseases: Secondary | ICD-10-CM | POA: Diagnosis not present

## 2021-03-15 DIAGNOSIS — M48061 Spinal stenosis, lumbar region without neurogenic claudication: Secondary | ICD-10-CM | POA: Diagnosis present

## 2021-03-15 DIAGNOSIS — M4316 Spondylolisthesis, lumbar region: Secondary | ICD-10-CM

## 2021-03-15 DIAGNOSIS — Z7984 Long term (current) use of oral hypoglycemic drugs: Secondary | ICD-10-CM

## 2021-03-15 DIAGNOSIS — M4326 Fusion of spine, lumbar region: Secondary | ICD-10-CM

## 2021-03-15 DIAGNOSIS — Z20822 Contact with and (suspected) exposure to covid-19: Secondary | ICD-10-CM | POA: Diagnosis not present

## 2021-03-15 DIAGNOSIS — T426X5A Adverse effect of other antiepileptic and sedative-hypnotic drugs, initial encounter: Secondary | ICD-10-CM | POA: Diagnosis not present

## 2021-03-15 DIAGNOSIS — E119 Type 2 diabetes mellitus without complications: Secondary | ICD-10-CM | POA: Diagnosis present

## 2021-03-15 DIAGNOSIS — Z419 Encounter for procedure for purposes other than remedying health state, unspecified: Secondary | ICD-10-CM

## 2021-03-15 DIAGNOSIS — M5416 Radiculopathy, lumbar region: Secondary | ICD-10-CM | POA: Diagnosis present

## 2021-03-15 LAB — HEMOGLOBIN A1C
Hgb A1c MFr Bld: 7.5 % — ABNORMAL HIGH (ref 4.8–5.6)
Mean Plasma Glucose: 168.55 mg/dL

## 2021-03-15 LAB — GLUCOSE, CAPILLARY
Glucose-Capillary: 92 mg/dL (ref 70–99)
Glucose-Capillary: 180 mg/dL — ABNORMAL HIGH (ref 70–99)
Glucose-Capillary: 216 mg/dL — ABNORMAL HIGH (ref 70–99)

## 2021-03-15 SURGERY — POSTERIOR LUMBAR FUSION 1 LEVEL
Anesthesia: General

## 2021-03-15 MED ORDER — POLYETHYLENE GLYCOL 3350 17 G PO PACK: 17.0000 g | PACK | Freq: Every day | ORAL | Status: DC | PRN

## 2021-03-15 MED ORDER — MIDAZOLAM HCL 2 MG/2ML IJ SOLN
INTRAMUSCULAR | Status: AC
  Filled 2021-03-15: qty 2

## 2021-03-15 MED ORDER — TRANEXAMIC ACID-NACL 1000-0.7 MG/100ML-% IV SOLN
INTRAVENOUS | Status: AC
  Filled 2021-03-15: qty 100

## 2021-03-15 MED ORDER — MAGNESIUM OXIDE 400 MG PO TABS: 400.0000 mg | ORAL_TABLET | Freq: Every day | ORAL | Status: DC

## 2021-03-15 MED ORDER — FENTANYL CITRATE (PF) 100 MCG/2ML IJ SOLN
25.0000 ug | INTRAMUSCULAR | Status: DC | PRN
  Administered 2021-03-15 (×2): 25 ug via INTRAVENOUS

## 2021-03-15 MED ORDER — OYSTER SHELL CALCIUM/D 500-200 MG-UNIT PO TABS: 1.0000 | ORAL_TABLET | Freq: Every day | ORAL | Status: DC

## 2021-03-15 MED ORDER — FLUTICASONE PROPIONATE 50 MCG/ACT NA SUSP: 2.0000 | Freq: Every day | NASAL | Status: DC | PRN

## 2021-03-15 MED ORDER — SUCRALFATE 1 G PO TABS
1.0000 g | ORAL_TABLET | Freq: Three times a day (TID) | ORAL | Status: DC
  Administered 2021-03-15 – 2021-03-19 (×16): 1 g via ORAL
  Filled 2021-03-15 (×20): qty 1

## 2021-03-15 MED ORDER — BUPIVACAINE HCL 0.5 % IJ SOLN
INTRAMUSCULAR | Status: DC | PRN
  Administered 2021-03-15: 10 mL

## 2021-03-15 MED ORDER — MIDAZOLAM HCL 5 MG/5ML IJ SOLN
INTRAMUSCULAR | Status: DC | PRN
  Administered 2021-03-15 (×2): 1 mg via INTRAVENOUS

## 2021-03-15 MED ORDER — METHOCARBAMOL 500 MG PO TABS
500.0000 mg | ORAL_TABLET | Freq: Four times a day (QID) | ORAL | Status: DC | PRN
  Administered 2021-03-18 – 2021-03-19 (×3): 500 mg via ORAL
  Filled 2021-03-15 (×3): qty 1

## 2021-03-15 MED ORDER — DOCUSATE SODIUM 100 MG PO CAPS
100.0000 mg | ORAL_CAPSULE | Freq: Two times a day (BID) | ORAL | Status: DC
  Administered 2021-03-15 – 2021-03-19 (×9): 100 mg via ORAL
  Filled 2021-03-15 (×9): qty 1

## 2021-03-15 MED ORDER — THROMBIN (RECOMBINANT) 20000 UNITS EX SOLR
CUTANEOUS | Status: AC
  Filled 2021-03-15: qty 20000

## 2021-03-15 MED ORDER — SODIUM CHLORIDE 0.9% FLUSH
3.0000 mL | Freq: Two times a day (BID) | INTRAVENOUS | Status: DC
  Administered 2021-03-16: 3 mL via INTRAVENOUS

## 2021-03-15 MED ORDER — 0.9 % SODIUM CHLORIDE (POUR BTL) OPTIME
TOPICAL | Status: DC | PRN
  Administered 2021-03-15: 1000 mL

## 2021-03-15 MED ORDER — HEMOSTATIC AGENTS (NO CHARGE) OPTIME
TOPICAL | Status: DC | PRN
  Administered 2021-03-15: 1

## 2021-03-15 MED ORDER — FLEET ENEMA 7-19 GM/118ML RE ENEM: 1.0000 | ENEMA | Freq: Once | RECTAL | Status: DC | PRN

## 2021-03-15 MED ORDER — GABAPENTIN 300 MG PO CAPS
300.0000 mg | ORAL_CAPSULE | Freq: Three times a day (TID) | ORAL | Status: DC
  Administered 2021-03-15 – 2021-03-18 (×9): 300 mg via ORAL
  Filled 2021-03-15 (×10): qty 1

## 2021-03-15 MED ORDER — BUPIVACAINE LIPOSOME 1.3 % IJ SUSP
10.0000 mL | Freq: Once | INTRAMUSCULAR | Status: DC
  Filled 2021-03-15: qty 10

## 2021-03-15 MED ORDER — SODIUM CHLORIDE 0.9 % IV SOLN: INTRAVENOUS | Status: DC

## 2021-03-15 MED ORDER — FENTANYL CITRATE (PF) 100 MCG/2ML IJ SOLN
INTRAMUSCULAR | Status: AC
  Filled 2021-03-15: qty 2

## 2021-03-15 MED ORDER — METHOCARBAMOL 1000 MG/10ML IJ SOLN
500.0000 mg | Freq: Four times a day (QID) | INTRAVENOUS | Status: DC | PRN
  Filled 2021-03-15: qty 5

## 2021-03-15 MED ORDER — SODIUM CHLORIDE 0.9 % IV SOLN: 250.0000 mL | INTRAVENOUS | Status: DC

## 2021-03-15 MED ORDER — ROCURONIUM BROMIDE 10 MG/ML (PF) SYRINGE
PREFILLED_SYRINGE | INTRAVENOUS | Status: AC
  Filled 2021-03-15: qty 10

## 2021-03-15 MED ORDER — ACETAMINOPHEN 10 MG/ML IV SOLN
INTRAVENOUS | Status: DC | PRN
  Administered 2021-03-15: 1000 mg via INTRAVENOUS

## 2021-03-15 MED ORDER — LINACLOTIDE 145 MCG PO CAPS
145.0000 ug | ORAL_CAPSULE | Freq: Every day | ORAL | Status: DC
  Administered 2021-03-16 – 2021-03-18 (×3): 145 ug via ORAL
  Filled 2021-03-15 (×6): qty 1

## 2021-03-15 MED ORDER — ONDANSETRON HCL 4 MG/2ML IJ SOLN
INTRAMUSCULAR | Status: AC
  Filled 2021-03-15: qty 2

## 2021-03-15 MED ORDER — HYDROCODONE-ACETAMINOPHEN 7.5-325 MG PO TABS
1.0000 | ORAL_TABLET | Freq: Four times a day (QID) | ORAL | Status: DC
  Administered 2021-03-15 – 2021-03-20 (×17): 1 via ORAL
  Filled 2021-03-15 (×17): qty 1

## 2021-03-15 MED ORDER — MECLIZINE HCL 25 MG PO TABS
25.0000 mg | ORAL_TABLET | Freq: Three times a day (TID) | ORAL | Status: DC | PRN
  Filled 2021-03-15: qty 1

## 2021-03-15 MED ORDER — PROPOFOL 10 MG/ML IV BOLUS
INTRAVENOUS | Status: DC | PRN
  Administered 2021-03-15: 100 mg via INTRAVENOUS

## 2021-03-15 MED ORDER — GABAPENTIN 300 MG PO CAPS: 300.0000 mg | ORAL_CAPSULE | Freq: Every day | ORAL | Status: DC

## 2021-03-15 MED ORDER — KETOROLAC TROMETHAMINE 15 MG/ML IJ SOLN
7.5000 mg | Freq: Four times a day (QID) | INTRAMUSCULAR | Status: AC
  Administered 2021-03-15 – 2021-03-16 (×4): 7.5 mg via INTRAVENOUS
  Filled 2021-03-15 (×4): qty 1

## 2021-03-15 MED ORDER — ONDANSETRON HCL 4 MG/2ML IJ SOLN: 4.0000 mg | Freq: Once | INTRAMUSCULAR | Status: DC | PRN

## 2021-03-15 MED ORDER — PHENYLEPHRINE HCL-NACL 20-0.9 MG/250ML-% IV SOLN
INTRAVENOUS | Status: DC | PRN
  Administered 2021-03-15: 30 ug/min via INTRAVENOUS
  Administered 2021-03-15: 25 ug/min via INTRAVENOUS

## 2021-03-15 MED ORDER — PANTOPRAZOLE SODIUM 40 MG IV SOLR: 40.0000 mg | Freq: Every day | INTRAVENOUS | Status: DC

## 2021-03-15 MED ORDER — LOSARTAN POTASSIUM 50 MG PO TABS
100.0000 mg | ORAL_TABLET | Freq: Every day | ORAL | Status: DC
  Administered 2021-03-16: 100 mg via ORAL
  Filled 2021-03-15 (×2): qty 2

## 2021-03-15 MED ORDER — CHLORHEXIDINE GLUCONATE 0.12 % MT SOLN
15.0000 mL | Freq: Once | OROMUCOSAL | Status: AC
  Administered 2021-03-15: 15 mL via OROMUCOSAL
  Filled 2021-03-15: qty 15

## 2021-03-15 MED ORDER — ASPIRIN EC 81 MG PO TBEC: 81.0000 mg | DELAYED_RELEASE_TABLET | Freq: Every day | ORAL | Status: DC

## 2021-03-15 MED ORDER — DOCUSATE SODIUM 100 MG PO CAPS: 100.0000 mg | ORAL_CAPSULE | Freq: Two times a day (BID) | ORAL | Status: DC

## 2021-03-15 MED ORDER — ACETAMINOPHEN 325 MG PO TABS
650.0000 mg | ORAL_TABLET | ORAL | Status: DC | PRN
  Administered 2021-03-17 – 2021-03-19 (×3): 650 mg via ORAL
  Filled 2021-03-15 (×3): qty 2

## 2021-03-15 MED ORDER — ACETAMINOPHEN 650 MG RE SUPP: 650.0000 mg | RECTAL | Status: DC | PRN

## 2021-03-15 MED ORDER — OXYCODONE HCL 5 MG PO TABS: 5.0000 mg | ORAL_TABLET | Freq: Once | ORAL | Status: DC | PRN

## 2021-03-15 MED ORDER — ACETAMINOPHEN 10 MG/ML IV SOLN
INTRAVENOUS | Status: AC
  Filled 2021-03-15: qty 100

## 2021-03-15 MED ORDER — VERAPAMIL HCL ER 180 MG PO TBCR: 180.0000 mg | EXTENDED_RELEASE_TABLET | Freq: Every day | ORAL | Status: DC

## 2021-03-15 MED ORDER — TRANEXAMIC ACID-NACL 1000-0.7 MG/100ML-% IV SOLN
INTRAVENOUS | Status: DC | PRN
  Administered 2021-03-15: 1000 mg via INTRAVENOUS

## 2021-03-15 MED ORDER — PHENAZOPYRIDINE HCL 200 MG PO TABS: 200.0000 mg | ORAL_TABLET | Freq: Three times a day (TID) | ORAL | Status: DC

## 2021-03-15 MED ORDER — ACETAMINOPHEN 325 MG PO TABS: 325.0000 mg | ORAL_TABLET | ORAL | Status: DC | PRN

## 2021-03-15 MED ORDER — CEFAZOLIN SODIUM-DEXTROSE 2-4 GM/100ML-% IV SOLN: 2.0000 g | Freq: Three times a day (TID) | INTRAVENOUS | Status: DC

## 2021-03-15 MED ORDER — BUDESONIDE 0.5 MG/2ML IN SUSP
0.2500 mg | Freq: Two times a day (BID) | RESPIRATORY_TRACT | Status: DC
  Administered 2021-03-15 – 2021-03-19 (×9): 0.25 mg via RESPIRATORY_TRACT
  Filled 2021-03-15 (×10): qty 2

## 2021-03-15 MED ORDER — ALUM & MAG HYDROXIDE-SIMETH 200-200-20 MG/5ML PO SUSP: 30.0000 mL | Freq: Four times a day (QID) | ORAL | Status: DC | PRN

## 2021-03-15 MED ORDER — BUPIVACAINE LIPOSOME 1.3 % IJ SUSP
INTRAMUSCULAR | Status: DC | PRN
  Administered 2021-03-15: 10 mL

## 2021-03-15 MED ORDER — DEXAMETHASONE SODIUM PHOSPHATE 10 MG/ML IJ SOLN
INTRAMUSCULAR | Status: AC
  Filled 2021-03-15: qty 1

## 2021-03-15 MED ORDER — DEXAMETHASONE SODIUM PHOSPHATE 10 MG/ML IJ SOLN
INTRAMUSCULAR | Status: DC | PRN
Start: 2021-03-15 — End: 2021-03-15
  Administered 2021-03-15: 10 mg via INTRAVENOUS

## 2021-03-15 MED ORDER — AMLODIPINE BESYLATE 5 MG PO TABS
5.0000 mg | ORAL_TABLET | Freq: Every day | ORAL | Status: DC
  Administered 2021-03-16: 5 mg via ORAL
  Filled 2021-03-15 (×2): qty 1

## 2021-03-15 MED ORDER — ROSUVASTATIN CALCIUM 5 MG PO TABS
10.0000 mg | ORAL_TABLET | Freq: Every day | ORAL | Status: DC
  Administered 2021-03-15 – 2021-03-19 (×5): 10 mg via ORAL
  Filled 2021-03-15 (×5): qty 2

## 2021-03-15 MED ORDER — ONDANSETRON HCL 4 MG/2ML IJ SOLN
4.0000 mg | Freq: Four times a day (QID) | INTRAMUSCULAR | Status: DC | PRN
Start: 2021-03-15 — End: 2021-03-20
  Administered 2021-03-16: 4 mg via INTRAVENOUS
  Filled 2021-03-15: qty 2

## 2021-03-15 MED ORDER — PHENOL 1.4 % MT LIQD: 1.0000 | OROMUCOSAL | Status: DC | PRN

## 2021-03-15 MED ORDER — CLINDAMYCIN PHOSPHATE 900 MG/50ML IV SOLN
900.0000 mg | INTRAVENOUS | Status: AC
  Administered 2021-03-15: 900 mg via INTRAVENOUS
  Filled 2021-03-15: qty 50

## 2021-03-15 MED ORDER — LIDOCAINE 2% (20 MG/ML) 5 ML SYRINGE
INTRAMUSCULAR | Status: AC
  Filled 2021-03-15: qty 5

## 2021-03-15 MED ORDER — HYDROCODONE-ACETAMINOPHEN 7.5-325 MG PO TABS
2.0000 | ORAL_TABLET | ORAL | Status: DC | PRN
  Administered 2021-03-19: 2 via ORAL
  Filled 2021-03-15 (×2): qty 2

## 2021-03-15 MED ORDER — KETAMINE HCL 50 MG/5ML IJ SOSY
PREFILLED_SYRINGE | INTRAMUSCULAR | Status: AC
  Filled 2021-03-15: qty 5

## 2021-03-15 MED ORDER — PANTOPRAZOLE SODIUM 40 MG PO TBEC
40.0000 mg | DELAYED_RELEASE_TABLET | Freq: Every day | ORAL | Status: DC
  Administered 2021-03-16 – 2021-03-19 (×4): 40 mg via ORAL
  Filled 2021-03-15 (×4): qty 1

## 2021-03-15 MED ORDER — FENTANYL CITRATE (PF) 250 MCG/5ML IJ SOLN
INTRAMUSCULAR | Status: DC | PRN
  Administered 2021-03-15 (×3): 50 ug via INTRAVENOUS

## 2021-03-15 MED ORDER — AMITRIPTYLINE HCL 50 MG PO TABS
25.0000 mg | ORAL_TABLET | Freq: Every day | ORAL | Status: DC
  Administered 2021-03-15 – 2021-03-19 (×5): 25 mg via ORAL
  Filled 2021-03-15 (×5): qty 1

## 2021-03-15 MED ORDER — ONDANSETRON HCL 4 MG/2ML IJ SOLN
INTRAMUSCULAR | Status: DC | PRN
  Administered 2021-03-15: 4 mg via INTRAVENOUS

## 2021-03-15 MED ORDER — FENTANYL CITRATE (PF) 250 MCG/5ML IJ SOLN
INTRAMUSCULAR | Status: AC
  Filled 2021-03-15: qty 5

## 2021-03-15 MED ORDER — MENTHOL 3 MG MT LOZG: 1.0000 | LOZENGE | OROMUCOSAL | Status: DC | PRN

## 2021-03-15 MED ORDER — BUPIVACAINE HCL (PF) 0.5 % IJ SOLN
INTRAMUSCULAR | Status: AC
  Filled 2021-03-15: qty 30

## 2021-03-15 MED ORDER — KETAMINE HCL 10 MG/ML IJ SOLN
INTRAMUSCULAR | Status: DC | PRN
  Administered 2021-03-15: 20 mg via INTRAVENOUS
  Administered 2021-03-15: 10 mg via INTRAVENOUS

## 2021-03-15 MED ORDER — DULAGLUTIDE 3 MG/0.5ML ~~LOC~~ SOAJ: 3.0000 mg | SUBCUTANEOUS | Status: DC

## 2021-03-15 MED ORDER — ONDANSETRON HCL 4 MG PO TABS: 4.0000 mg | ORAL_TABLET | Freq: Four times a day (QID) | ORAL | Status: DC | PRN

## 2021-03-15 MED ORDER — LIDOCAINE 2% (20 MG/ML) 5 ML SYRINGE
INTRAMUSCULAR | Status: DC | PRN
  Administered 2021-03-15: 60 mg via INTRAVENOUS

## 2021-03-15 MED ORDER — THROMBIN 20000 UNITS EX SOLR
CUTANEOUS | Status: DC | PRN
  Administered 2021-03-15: 20 mL

## 2021-03-15 MED ORDER — ORAL CARE MOUTH RINSE: 15.0000 mL | Freq: Once | OROMUCOSAL | Status: AC

## 2021-03-15 MED ORDER — ALBUMIN HUMAN 5 % IV SOLN: INTRAVENOUS | Status: DC | PRN

## 2021-03-15 MED ORDER — ACETAMINOPHEN 160 MG/5ML PO SOLN: 325.0000 mg | ORAL | Status: DC | PRN

## 2021-03-15 MED ORDER — SODIUM CHLORIDE 0.9 % IV SOLN: INTRAVENOUS | Status: DC | PRN

## 2021-03-15 MED ORDER — SODIUM CHLORIDE 0.9% FLUSH: 3.0000 mL | INTRAVENOUS | Status: DC | PRN

## 2021-03-15 MED ORDER — MEPERIDINE HCL 25 MG/ML IJ SOLN: 6.2500 mg | INTRAMUSCULAR | Status: DC | PRN

## 2021-03-15 MED ORDER — HYDROCODONE-ACETAMINOPHEN 10-325 MG PO TABS
1.0000 | ORAL_TABLET | ORAL | Status: DC | PRN
  Administered 2021-03-16 – 2021-03-17 (×2): 1 via ORAL
  Filled 2021-03-15 (×2): qty 1

## 2021-03-15 MED ORDER — PHENYLEPHRINE 40 MCG/ML (10ML) SYRINGE FOR IV PUSH (FOR BLOOD PRESSURE SUPPORT)
PREFILLED_SYRINGE | INTRAVENOUS | Status: AC
  Filled 2021-03-15: qty 10

## 2021-03-15 MED ORDER — PROPOFOL 10 MG/ML IV BOLUS
INTRAVENOUS | Status: AC
  Filled 2021-03-15: qty 20

## 2021-03-15 MED ORDER — CLINDAMYCIN PHOSPHATE 600 MG/50ML IV SOLN
600.0000 mg | Freq: Three times a day (TID) | INTRAVENOUS | Status: AC
  Administered 2021-03-15 (×2): 600 mg via INTRAVENOUS
  Filled 2021-03-15 (×2): qty 50

## 2021-03-15 MED ORDER — BISACODYL 5 MG PO TBEC: 5.0000 mg | DELAYED_RELEASE_TABLET | Freq: Every day | ORAL | Status: DC | PRN

## 2021-03-15 MED ORDER — PHENYLEPHRINE 40 MCG/ML (10ML) SYRINGE FOR IV PUSH (FOR BLOOD PRESSURE SUPPORT)
PREFILLED_SYRINGE | INTRAVENOUS | Status: DC | PRN
  Administered 2021-03-15 (×5): 80 ug via INTRAVENOUS

## 2021-03-15 MED ORDER — OXYCODONE HCL 5 MG/5ML PO SOLN: 5.0000 mg | Freq: Once | ORAL | Status: DC | PRN

## 2021-03-15 MED ORDER — INSULIN ASPART 100 UNIT/ML IJ SOLN
0.0000 [IU] | Freq: Three times a day (TID) | INTRAMUSCULAR | Status: DC
  Administered 2021-03-15: 5 [IU] via SUBCUTANEOUS
  Administered 2021-03-16: 11 [IU] via SUBCUTANEOUS
  Administered 2021-03-16: 3 [IU] via SUBCUTANEOUS
  Administered 2021-03-16: 8 [IU] via SUBCUTANEOUS
  Administered 2021-03-17 – 2021-03-18 (×3): 3 [IU] via SUBCUTANEOUS
  Administered 2021-03-18: 5 [IU] via SUBCUTANEOUS
  Administered 2021-03-19: 2 [IU] via SUBCUTANEOUS
  Administered 2021-03-19: 8 [IU] via SUBCUTANEOUS
  Administered 2021-03-19: 3 [IU] via SUBCUTANEOUS

## 2021-03-15 MED ORDER — ROCURONIUM BROMIDE 10 MG/ML (PF) SYRINGE
PREFILLED_SYRINGE | INTRAVENOUS | Status: DC | PRN
  Administered 2021-03-15: 100 mg via INTRAVENOUS
  Administered 2021-03-15: 30 mg via INTRAVENOUS

## 2021-03-15 MED ORDER — LACTATED RINGERS IV SOLN
INTRAVENOUS | Status: DC | PRN
Start: 2021-03-15 — End: 2021-03-15

## 2021-03-15 SURGICAL SUPPLY — 81 items
ADH SKN CLS APL DERMABOND .7 (GAUZE/BANDAGES/DRESSINGS) ×1
AGENT HMST MTR 8 SURGIFLO (HEMOSTASIS)
BAG COUNTER SPONGE SURGICOUNT (BAG) ×2 IMPLANT
BAG SPNG CNTER NS LX DISP (BAG) ×1
BLADE CLIPPER SURG (BLADE) IMPLANT
BONE VIVIGEN FORMABLE 1.3CC (Bone Implant) ×2 IMPLANT
BONE VIVIGEN FORMABLE 5.4CC (Bone Implant) ×2 IMPLANT
BUR MATCHSTICK NEURO 3.0 LAGG (BURR) ×2 IMPLANT
BUR NEURO DRILL SOFT 3.0X3.8M (BURR) ×2 IMPLANT
BUR SABER RD CUTTING 3.0 (BURR) ×2 IMPLANT
CAGE SABLE 10X26 15D (Cage) ×2 IMPLANT
CAP LOCKING THREADED (Cap) ×8 IMPLANT
CNTNR URN SCR LID CUP LEK RST (MISCELLANEOUS) ×1 IMPLANT
CONT SPEC 4OZ STRL OR WHT (MISCELLANEOUS) ×2
COVER BACK TABLE 80X110 HD (DRAPES) ×2 IMPLANT
COVER SURGICAL LIGHT HANDLE (MISCELLANEOUS) ×2 IMPLANT
DECANTER SPIKE VIAL GLASS SM (MISCELLANEOUS) IMPLANT
DERMABOND ADVANCED (GAUZE/BANDAGES/DRESSINGS) ×1
DERMABOND ADVANCED .7 DNX12 (GAUZE/BANDAGES/DRESSINGS) ×1 IMPLANT
DRAPE C-ARM 42X72 X-RAY (DRAPES) ×2 IMPLANT
DRAPE C-ARMOR (DRAPES) ×2 IMPLANT
DRAPE MICROSCOPE LEICA (MISCELLANEOUS) ×2 IMPLANT
DRAPE POUCH INSTRU U-SHP 10X18 (DRAPES) ×2 IMPLANT
DRAPE SURG 17X23 STRL (DRAPES) ×8 IMPLANT
DRSG MEPILEX BORDER 4X4 (GAUZE/BANDAGES/DRESSINGS) IMPLANT
DRSG MEPILEX BORDER 4X8 (GAUZE/BANDAGES/DRESSINGS) ×2 IMPLANT
DURAPREP 26ML APPLICATOR (WOUND CARE) ×2 IMPLANT
DURASEAL SPINE SEALANT 3ML (MISCELLANEOUS) ×2 IMPLANT
ELECT BLADE 4.0 EZ CLEAN MEGAD (MISCELLANEOUS) ×2
ELECT BLADE 6.5 EXT (BLADE) IMPLANT
ELECT CAUTERY BLADE 6.4 (BLADE) ×2 IMPLANT
ELECT REM PT RETURN 9FT ADLT (ELECTROSURGICAL) ×2
ELECTRODE BLDE 4.0 EZ CLN MEGD (MISCELLANEOUS) ×1 IMPLANT
ELECTRODE REM PT RTRN 9FT ADLT (ELECTROSURGICAL) ×1 IMPLANT
EVACUATOR 1/8 PVC DRAIN (DRAIN) IMPLANT
GAUZE SPONGE 4X4 12PLY STRL (GAUZE/BANDAGES/DRESSINGS) ×2 IMPLANT
GLOVE SRG 8 PF TXTR STRL LF DI (GLOVE) ×1 IMPLANT
GLOVE SURG 8.5 LATEX PF (GLOVE) ×2 IMPLANT
GLOVE SURG LTX SZ9 (GLOVE) ×2 IMPLANT
GLOVE SURG ORTHO LTX SZ7.5 (GLOVE) ×2 IMPLANT
GLOVE SURG UNDER POLY LF SZ8 (GLOVE) ×2
GOWN STRL REUS W/ TWL LRG LVL3 (GOWN DISPOSABLE) ×1 IMPLANT
GOWN STRL REUS W/TWL 2XL LVL3 (GOWN DISPOSABLE) ×4 IMPLANT
GOWN STRL REUS W/TWL LRG LVL3 (GOWN DISPOSABLE) ×2
KIT BASIN OR (CUSTOM PROCEDURE TRAY) ×2 IMPLANT
KIT POSITION SURG JACKSON T1 (MISCELLANEOUS) ×2 IMPLANT
KIT TURNOVER KIT B (KITS) ×2 IMPLANT
MILL MEDIUM DISP (BLADE) ×2 IMPLANT
NEEDLE 22X1 1/2 (OR ONLY) (NEEDLE) ×2 IMPLANT
NEEDLE SPNL 18GX3.5 QUINCKE PK (NEEDLE) ×2 IMPLANT
NS IRRIG 1000ML POUR BTL (IV SOLUTION) ×2 IMPLANT
PACK LAMINECTOMY ORTHO (CUSTOM PROCEDURE TRAY) ×2 IMPLANT
PAD ARMBOARD 7.5X6 YLW CONV (MISCELLANEOUS) ×4 IMPLANT
PATTIES SURGICAL .5 X.5 (GAUZE/BANDAGES/DRESSINGS) ×2 IMPLANT
PATTIES SURGICAL .75X.75 (GAUZE/BANDAGES/DRESSINGS) ×2 IMPLANT
PATTIES SURGICAL 1X1 (DISPOSABLE) ×2 IMPLANT
ROD 40MM SPINAL (Rod) ×2 IMPLANT
ROD CREO 45MM SPINAL (Rod) ×2 IMPLANT
SCREW MOD SD CREO 7.5X40 (Screw) ×4 IMPLANT
SCREW MOD SD CREO 7.5X45 (Screw) ×4 IMPLANT
SCREW PA THRD CREO TULIP 5.5X4 (Head) ×8 IMPLANT
SPOGE SURGIFLO 8M (HEMOSTASIS)
SPONGE SURGIFLO 8M (HEMOSTASIS) IMPLANT
SPONGE SURGIFOAM ABS GEL 100 (HEMOSTASIS) ×2 IMPLANT
SPONGE T-LAP 18X18 ~~LOC~~+RFID (SPONGE) ×2 IMPLANT
SPONGE T-LAP 4X18 ~~LOC~~+RFID (SPONGE) ×8 IMPLANT
SUT NURALON 4 0 TR CR/8 (SUTURE) ×2 IMPLANT
SUT VIC AB 0 CT1 27 (SUTURE) ×2
SUT VIC AB 0 CT1 27XBRD ANBCTR (SUTURE) ×1 IMPLANT
SUT VIC AB 1 CTX 36 (SUTURE) ×2
SUT VIC AB 1 CTX36XBRD ANBCTR (SUTURE) ×1 IMPLANT
SUT VIC AB 2-0 CT1 27 (SUTURE)
SUT VIC AB 2-0 CT1 TAPERPNT 27 (SUTURE) IMPLANT
SUT VIC AB 3-0 X1 27 (SUTURE) ×2 IMPLANT
SYR 20ML LL LF (SYRINGE) ×2 IMPLANT
SYR CONTROL 10ML LL (SYRINGE) ×4 IMPLANT
TOWEL GREEN STERILE (TOWEL DISPOSABLE) ×2 IMPLANT
TOWEL GREEN STERILE FF (TOWEL DISPOSABLE) ×2 IMPLANT
TRAY FOLEY MTR SLVR 16FR STAT (SET/KITS/TRAYS/PACK) ×2 IMPLANT
WATER STERILE IRR 1000ML POUR (IV SOLUTION) ×2 IMPLANT
YANKAUER SUCT BULB TIP NO VENT (SUCTIONS) ×2 IMPLANT

## 2021-03-15 NOTE — Interval H&P Note (Signed)
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  The various methods of treatment have been discussed with the patient and family. After consideration of risks, benefits and other options for treatment, the patient has consented to  Procedure(s): CENTRAL LAMINECTOMY L4-5 AND L5-S1, TRANSFORAMINAL LUMBAR INTERBODY FUSION LEFT L5-S1 WITH GLOBUS PEDICLE SCREWS, RODS AND SABLE CAGE, LOCAL BONE GRAFT, ALLOGRAFT BONE GRAFT (N/A) as a surgical intervention.  The patient's history has been reviewed, patient examined, no change in status, stable for surgery.  I have reviewed the patient's chart and labs.  Questions were answered to the patient's satisfaction.     Basil Dess

## 2021-03-15 NOTE — Telephone Encounter (Signed)
Do you want pt to continue same dose until her next appointment?

## 2021-03-15 NOTE — Brief Op Note (Signed)
03/15/2021  1:29 PM  PATIENT:  Stephanie Moreno  75 y.o. 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   EBL:  500 mL   BLOOD ADMINISTERED: 150 CC CELLSAVER  DRAINS: Urinary Catheter (Foley)   LOCAL MEDICATIONS USED:  MARCAINE 0.5% 1:1 EXPAREL 1.3%  Amount: 20 ml  SPECIMEN:  No Specimen  DISPOSITION OF SPECIMEN:  N/A  COUNTS:  YES  TOURNIQUET:  * No tourniquets in log *  DICTATION: .Dragon Dictation  PLAN OF CARE: Admit to inpatient   PATIENT DISPOSITION:  PACU - hemodynamically stable.   Delay start of Pharmacological VTE agent (>24hrs) due to surgical blood loss or risk of bleeding: yes

## 2021-03-15 NOTE — Anesthesia Procedure Notes (Addendum)
Procedure Name: Intubation Date/Time: 03/15/2021 7:59 AM Performed by: Jenne Campus, CRNA Pre-anesthesia Checklist: Patient identified, Emergency Drugs available, Suction available and Patient being monitored Patient Re-evaluated:Patient Re-evaluated prior to induction Oxygen Delivery Method: Circle System Utilized Preoxygenation: Pre-oxygenation with 100% oxygen Induction Type: IV induction Ventilation: Mask ventilation without difficulty Laryngoscope Size: Miller and 2 Grade View: Grade I Tube type: Oral Tube size: 7.0 mm Number of attempts: 1 Airway Equipment and Method: Stylet and Oral airway Placement Confirmation: ETT inserted through vocal cords under direct vision, positive ETCO2 and breath sounds checked- equal and bilateral Secured at: 21 cm Tube secured with: Tape Dental Injury: Teeth and Oropharynx as per pre-operative assessment

## 2021-03-15 NOTE — Telephone Encounter (Signed)
Tried to call pt and pt's sister, was unable to reach either or leave a message.

## 2021-03-15 NOTE — Transfer of Care (Signed)
Immediate Anesthesia Transfer of Care Note  Patient: Stephanie Moreno  Procedure(s) Performed: CENTRAL LAMINECTOMY LUMBAR FOUR-FIVE  AND LUMBAR FIVE-SACRAL ONE,TRANSFORAMINAL LUMBAR INTERBODY FUSION LEFT LUMBAR FIVE-SACRAL ONE WITH GLOBUS PEDICLE SCREWS, RODS AND SABLE CAGE, LOCAL BONE GRAFT, ALLOGRAFT BONE GRAFT  Patient Location: PACU  Anesthesia Type:General  Level of Consciousness: oriented, drowsy and patient cooperative  Airway & Oxygen Therapy: Patient Spontanous Breathing and Patient connected to face mask oxygen  Post-op Assessment: Report given to RN and Post -op Vital signs reviewed and stable  Post vital signs: Reviewed  Last Vitals:  Vitals Value Taken Time  BP 146/133 03/15/21 1341  Temp    Pulse 73 03/15/21 1348  Resp 24 03/15/21 1348  SpO2 100 % 03/15/21 1348  Vitals shown include unvalidated device data.  Last Pain:  Vitals:   03/15/21 0642  TempSrc: Oral  PainSc: 6       Patients Stated Pain Goal: 2 ([id number])  Complications: No notable events documented.

## 2021-03-15 NOTE — Progress Notes (Signed)
Orthopedic Tech Progress Note Patient Details:  Stephanie Moreno 18-Jul-1945 [id number]  Ortho Devices Type of Ortho Device: Lumbar corsett Ortho Device/Splint Location: BACK Ortho Device/Splint Interventions: Ordered   Post Interventions Patient Tolerated: Well Instructions Provided: [facility] [place] 03/15/2021, 4:35 PM

## 2021-03-15 NOTE — H&P (Signed)
Stephanie Moreno is an 75 y.o. female.   Chief Complaint: Low back pain and lower extremity radiculopathy HPI: 75 year old white female with history of L4-5 and L5-S1 stenosis and the above complaint comes in for preop evaluation.  States that symptoms unchanged from previous visit.  She is wanting to proceed with central laminectomy L4-5 and L5-S1, transforaminal lumbar interbody fusion left L5-S1 with globus pedicle screws, rods and cage with local bone graft, allograft bone graft.  Past Medical History:  Diagnosis Date   Acid reflux disease    Anemia    Anxiety    Asthma    Breast cancer ([place])    left side   Chronic back pain    Chronic kidney disease    Diabetes mellitus without complication ([place])    Diverticulitis    Hypertension    Lumbosacral spondylosis    Per previous PCP records   Sleep apnea    Thyroid disease     Past Surgical History:  Procedure Laterality Date   ABDOMINAL HYSTERECTOMY     BIOPSY  02/19/2020   Procedure: BIOPSY;  Surgeon: Eloise Harman, DO;  Location: [facility];  Service: Endoscopy;;   BREAST LUMPECTOMY Left 2003   BREAST SURGERY     COLONOSCOPY WITH PROPOFOL N/A 02/19/2020   non-bleeding internal hemorrhoids, many small-mouthed diverticula in entire colon.   ESOPHAGOGASTRODUODENOSCOPY (EGD) WITH PROPOFOL N/A 02/19/2020    benign-appearing esophageal stenosis s/p dilation, gastritis s/p biopsy, normal duodenum. Negative H.pylori, negative duodenal biopsy.    GIVENS CAPSULE STUDY N/A 06/14/2020   Procedure: GIVENS CAPSULE STUDY;  Surgeon: Eloise Harman, DO;  Location: [facility];  Service: Endoscopy;  Laterality: N/A;  7:30am   THYROIDECTOMY     tummy tuck     at time of breast surgery 2003    Family History  Problem Relation Age of Onset   Diabetes Mother    Thyroid disease Mother    Colon cancer Neg Hx    Social History:  reports that she has never smoked. She has never used smokeless tobacco. She reports that she does not  currently use alcohol. She reports that she does not currently use drugs.  Allergies:  Allergies  Allergen Reactions   Codeine Nausea And Vomiting   Hydrochlorothiazide Other (See Comments)   Lisinopril Nausea And Vomiting   Oxybutynin Other (See Comments)   Penicillins Hives    Medications Prior to Admission  Medication Sig Dispense Refill   acetaminophen (TYLENOL) 500 MG tablet Take 1,000 mg by mouth every 6 (six) hours as needed for moderate pain.     amitriptyline (ELAVIL) 25 MG tablet Take 25 mg by mouth at bedtime.     amLODipine (NORVASC) 5 MG tablet Take 5 mg by mouth daily.     beclomethasone (QVAR) 80 MCG/ACT inhaler Inhale 1 puff into the lungs 2 (two) times daily as needed (shortness of breath).     cyclobenzaprine (FLEXERIL) 10 MG tablet Take 10 mg by mouth 3 (three) times daily as needed for muscle spasms.     Dulaglutide (TRULICITY) 3 OI/7.1IW SOPN Inject 3 mg as directed once a week. 2 mL 3   furosemide (LASIX) 40 MG tablet Take 40-80 mg by mouth See admin instructions. Takes 40 mg every other day. On alternate days take 80 mg     gabapentin (NEURONTIN) 300 MG capsule Take 300 mg by mouth at bedtime.     glipiZIDE (GLUCOTROL XL) 5 MG 24 hr tablet TAKE 1 TABLET BY  MOUTH EVERY DAY WITH BREAKFAST 90 tablet 0   GLUCOSAMINE-CHONDROITIN PO Take 1 tablet by mouth daily.     insulin degludec (TRESIBA FLEXTOUCH) 100 UNIT/ML FlexTouch Pen Inject 40 Units into the skin daily. (Patient taking differently: Inject 40 Units into the skin at bedtime.) 15 mL 2   lidocaine (LIDODERM) 5 % Place 2 patches onto the skin daily as needed (pain). Remove & Discard patch within 12 hours or as directed by MD. Applied to lower back/shoulders/neck for pain     linaclotide (LINZESS) 145 MCG CAPS capsule Take 1 capsule (145 mcg total) by mouth daily before breakfast. 30 capsule 5   losartan (COZAAR) 100 MG tablet Take 100 mg by mouth daily.     magnesium oxide (MAG-OX) 400 MG tablet Take 400 mg by mouth  daily.     meclizine (ANTIVERT) 25 MG tablet Take 25 mg by mouth 3 (three) times daily as needed for dizziness.     metoprolol tartrate (LOPRESSOR) 50 MG tablet Take 50 mg by mouth 2 (two) times daily.     Naphazoline HCl (CLEAR EYES OP) Place 1 drop into both eyes daily as needed (dry eyes).     pantoprazole (PROTONIX) 40 MG tablet Take 1 tablet (40 mg total) by mouth daily. 30 minutes before breakfast 90 tablet 3   polyethylene glycol (MIRALAX / GLYCOLAX) 17 g packet Take 17 g by mouth daily as needed for moderate constipation.     rosuvastatin (CRESTOR) 10 MG tablet Take 10 mg by mouth at bedtime.      sucralfate (CARAFATE) 1 g tablet Take 1 g by mouth in the morning, at noon, and at bedtime.     traMADol (ULTRAM) 50 MG tablet Take 50 mg by mouth every 6 (six) hours as needed for moderate pain.     verapamil (CALAN-SR) 180 MG CR tablet Take 180 mg by mouth daily.     Vibegron (GEMTESA) 75 MG TABS Take 75 mg by mouth daily.     aspirin EC 81 MG tablet Take 81 mg by mouth daily. Swallow whole. (Patient not taking: No sig reported)     Blood Glucose Monitoring Suppl (ACCU-CHEK GUIDE ME) w/Device KIT 1 Piece by Does not apply route as directed. 1 kit 0   calcium-vitamin D (OSCAL WITH D) 500-200 MG-UNIT TABS tablet Take 1 tablet by mouth in the morning, at noon, and at bedtime.     docusate sodium (COLACE) 100 MG capsule TAKE 1 CAPSULE BY MOUTH TWICE A DAY (Patient not taking: No sig reported) 60 capsule 3   doxycycline (VIBRA-TABS) 100 MG tablet Take 1 tablet (100 mg total) by mouth 2 (two) times daily. (Patient not taking: No sig reported) 20 tablet 0   fluticasone (FLONASE) 50 MCG/ACT nasal spray Place 2 sprays into both nostrils daily as needed for allergies or rhinitis.     glucose blood test strip 1 each by Other route 3 (three) times daily. Dx: E11.65 100 each 5   Insulin Pen Needle (B-D ULTRAFINE III SHORT PEN) 31G X 8 MM MISC 1 each by Does not apply route as directed. 100 each 3   Lancets  (ONETOUCH DELICA PLUS [id number]) MISC Use to check glucose twice daily 100 each 11   phenazopyridine (PYRIDIUM) 200 MG tablet Take 1 tablet (200 mg total) by mouth 3 (three) times daily. (Patient not taking: Reported on 03/08/2021) 6 tablet 0    Results for orders placed or performed during the hospital encounter of 03/15/21 (from the past  48 hour(s))  Glucose, capillary     Status: None   Collection Time: 03/15/21  6:29 AM  Result Value Ref Range   Glucose-Capillary 92 70 - 99 mg/dL    Comment: Glucose reference range applies only to samples taken after fasting for at least 8 hours.   Comment 1 Notify RN    SLEEP STUDY DOCUMENTS  Result Date: 03/14/2021 Ordered by an unspecified provider.   Review of Systems  Constitutional:  Positive for activity change.  HENT: Negative.    Respiratory: Negative.    Cardiovascular: Negative.   Genitourinary: Negative.   Musculoskeletal:  Positive for back pain and gait problem.  Neurological:  Positive for numbness.  Psychiatric/Behavioral: Negative.     Blood pressure (!) 151/72, pulse 92, temperature 98 F (36.7 C), temperature source Oral, resp. rate 20. Physical Exam HENT:     Head: Normocephalic and atraumatic.     Nose: Nose normal.  Cardiovascular:     Rate and Rhythm: Regular rhythm.  Pulmonary:     Effort: Pulmonary effort is normal. No respiratory distress.     Breath sounds: Normal breath sounds.  Musculoskeletal:        General: Tenderness present.     Cervical back: Normal range of motion.  Neurological:     Mental Status: She is alert and oriented to person, place, and time.     Assessment/Plan L4-5 and L5-S1 stenosis with low back pain and lower extremity radiculopathy  We will proceed with surgery as scheduled.  Surgical procedure discussed along with potential rehab/recovery time.  All questions answered and she wishes to proceed.  Benjiman Core, PA-C 03/15/2021, 7:05 AM
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
## 2021-03-15 NOTE — Discharge Instructions (Addendum)

## 2021-03-16 LAB — BASIC METABOLIC PANEL
Anion gap: 11 (ref 5–15)
BUN: 20 mg/dL (ref 8–23)
CO2: 18 mmol/L — ABNORMAL LOW (ref 22–32)
Calcium: 8 mg/dL — ABNORMAL LOW (ref 8.9–10.3)
Chloride: 103 mmol/L (ref 98–111)
Creatinine, Ser: 1.42 mg/dL — ABNORMAL HIGH (ref 0.44–1.00)
GFR, Estimated: 39 mL/min — ABNORMAL LOW (ref >60)
Glucose, Bld: 397 mg/dL — ABNORMAL HIGH (ref 70–99)
Potassium: 4 mmol/L (ref 3.5–5.1)
Sodium: 132 mmol/L — ABNORMAL LOW (ref 135–145)

## 2021-03-16 LAB — CBC
HCT: 30.3 % — ABNORMAL LOW (ref 36.0–46.0)
Hemoglobin: 9.9 g/dL — ABNORMAL LOW (ref 12.0–15.0)
MCH: 28.4 pg (ref 26.0–34.0)
MCHC: 32.7 g/dL (ref 30.0–36.0)
MCV: 87.1 fL (ref 80.0–100.0)
Platelets: 196 10*3/uL (ref 150–400)
RBC: 3.48 MIL/uL — ABNORMAL LOW (ref 3.87–5.11)
RDW: 13.7 % (ref 11.5–15.5)
WBC: 11.3 10*3/uL — ABNORMAL HIGH (ref 4.0–10.5)
nRBC: 0 % (ref 0.0–0.2)

## 2021-03-16 LAB — GLUCOSE, CAPILLARY
Glucose-Capillary: 304 mg/dL — ABNORMAL HIGH (ref 70–99)
Glucose-Capillary: 282 mg/dL — ABNORMAL HIGH (ref 70–99)
Glucose-Capillary: 192 mg/dL — ABNORMAL HIGH (ref 70–99)
Glucose-Capillary: 108 mg/dL — ABNORMAL HIGH (ref 70–99)

## 2021-03-16 MED ORDER — INFLUENZA VAC A&B SA ADJ QUAD 0.5 ML IM PRSY
0.5000 mL | PREFILLED_SYRINGE | INTRAMUSCULAR | Status: AC
  Administered 2021-03-17: 0.5 mL via INTRAMUSCULAR
  Filled 2021-03-16: qty 0.5

## 2021-03-16 MED ORDER — INSULIN ASPART 100 UNIT/ML IJ SOLN: 3.0000 [IU] | Freq: Every day | INTRAMUSCULAR | Status: DC

## 2021-03-16 MED ORDER — CHLORHEXIDINE GLUCONATE CLOTH 2 % EX PADS
6.0000 | MEDICATED_PAD | Freq: Every day | CUTANEOUS | Status: DC
  Administered 2021-03-16 – 2021-03-18 (×3): 6 via TOPICAL

## 2021-03-16 MED ORDER — INSULIN ASPART 100 UNIT/ML IJ SOLN
3.0000 [IU] | Freq: Three times a day (TID) | INTRAMUSCULAR | Status: DC
  Administered 2021-03-16 – 2021-03-19 (×8): 3 [IU] via SUBCUTANEOUS

## 2021-03-16 MED ORDER — INSULIN GLARGINE-YFGN 100 UNIT/ML ~~LOC~~ SOLN
20.0000 [IU] | Freq: Every day | SUBCUTANEOUS | Status: DC
  Administered 2021-03-16 – 2021-03-19 (×4): 20 [IU] via SUBCUTANEOUS
  Filled 2021-03-16 (×5): qty 0.2

## 2021-03-16 MED ORDER — FUROSEMIDE 40 MG PO TABS
40.0000 mg | ORAL_TABLET | Freq: Every day | ORAL | Status: DC
  Administered 2021-03-17: 40 mg via ORAL
  Filled 2021-03-16: qty 1

## 2021-03-16 NOTE — Progress Notes (Signed)
     Subjective: 1 Day Post-Op Procedure(s) (LRB): CENTRAL LAMINECTOMY LUMBAR FOUR-FIVE  AND LUMBAR FIVE-SACRAL ONE,TRANSFORAMINAL LUMBAR INTERBODY FUSION LEFT LUMBAR FIVE-SACRAL ONE WITH GLOBUS PEDICLE SCREWS, RODS AND SABLE CAGE, LOCAL BONE GRAFT, ALLOGRAFT BONE GRAFT (N/A) Awake, alert and oriented x 4. Tiny 1 mm dural tear single suture repair with fibrin glue. She has minimal HA, no nuchal signs. Walked with PT a few steps and is sitting up in a bed side recliner. Lives alone and is planning to go to a SNF post hospital.  Up withPT and start mobilization. BS is elevated due to change of meds inability to use some oral agents due to hypovolemia and increase risks of kidney injury. She is tolerating hydrocodone and gabapentin. C/O some right leg weakness. Left leg with preop pain is improved, I suspect the right leg weakness is due to irritation, the right side of the spinal canal had as much ligament redundancy and irritation as the left side. The left side had the dural tear. I expect these will both improve over time.  Patient reports pain as moderate.    Objective:   VITALS:  Temp:  [97.5 F (36.4 C)-98.5 F (36.9 C)] 98.3 F (36.8 C) (09/14 1452) Pulse Rate:  [72-93] 90 (09/14 1452) Resp:  [16-18] 18 (09/14 1452) BP: (133-160)/(62-73) 140/69 (09/14 1452) SpO2:  [95 %-100 %] 98 % (09/14 1452)  Neurologically intact ABD soft Neurovascular intact Sensation intact distally Intact pulses distally Dorsiflexion/Plantar flexion intact Incision: dressing C/D/I, no drainage, and scant drainage   LABS Recent Labs    03/16/21 0102  HGB 9.9*  WBC 11.3*  PLT 196   Recent Labs    03/16/21 0102  NA 132*  K 4.0  CL 103  CO2 18*  BUN 20  CREATININE 1.42*  GLUCOSE 397*   No results for input(s): LABPT, INR in the last 72 hours.   Assessment/Plan: 1 Day Post-Op Procedure(s) (LRB): CENTRAL LAMINECTOMY LUMBAR FOUR-FIVE  AND LUMBAR FIVE-SACRAL ONE,TRANSFORAMINAL LUMBAR INTERBODY  FUSION LEFT LUMBAR FIVE-SACRAL ONE WITH GLOBUS PEDICLE SCREWS, RODS AND SABLE CAGE, LOCAL BONE GRAFT, ALLOGRAFT BONE GRAFT (N/A)  Advance diet Up with therapy Discharge to SNF Appreciate the suggestions concerning her diabetes and will impliment them. Semglee 20 u SQ QHS, Novolong 3 u SQ TID with meals and qhs.   Basil Dess 03/16/2021, 3:30 PM Patient ID: Stephanie Moreno, female   DOB: 08/11/1945, 75 y.o.   MRN: [id number]

## 2021-03-16 NOTE — Telephone Encounter (Signed)
Tried to call pt's sister, did not receive an answer and was not able to leave a message.

## 2021-03-16 NOTE — Plan of Care (Signed)
  Problem: Clinical Measurements: Goal: Ability to maintain clinical measurements within normal limits will improve Outcome: Progressing Goal: Will remain free from infection Outcome: Progressing   Problem: Activity: Goal: Risk for activity intolerance will decrease Outcome: Progressing   

## 2021-03-16 NOTE — Anesthesia Postprocedure Evaluation (Signed)
Anesthesia Post Note  Patient: Stephanie Moreno  Procedure(s) Performed: CENTRAL LAMINECTOMY LUMBAR FOUR-FIVE  AND LUMBAR FIVE-SACRAL ONE,TRANSFORAMINAL LUMBAR INTERBODY FUSION LEFT LUMBAR FIVE-SACRAL ONE WITH GLOBUS PEDICLE SCREWS, RODS AND SABLE CAGE, LOCAL BONE GRAFT, ALLOGRAFT BONE GRAFT     Patient location during evaluation: PACU Anesthesia Type: General Level of consciousness: awake and alert Pain management: pain level controlled Vital Signs Assessment: post-procedure vital signs reviewed and stable Respiratory status: spontaneous breathing, nonlabored ventilation, respiratory function stable and patient connected to nasal cannula oxygen Cardiovascular status: blood pressure returned to baseline and stable Postop Assessment: no apparent nausea or vomiting Anesthetic complications: no   No notable events documented.  Last Vitals:  Vitals:   03/16/21 0825 03/16/21 0843  BP: (!) 142/69   Pulse: 86 88  Resp: 17 18  Temp: 36.6 C   SpO2: 100% 98%    Last Pain:  Vitals:   03/16/21 1029  TempSrc:   PainSc: 1                  Tiajuana Amass

## 2021-03-16 NOTE — TOC Initial Note (Addendum)
Transition of Care [facility]) - Initial/Assessment Note    Patient Details  Name: Stephanie Moreno MRN: [id number] Date of Birth: [birth year]  [facility] CM/SW Contact:    Sharin Mons, RN Phone Number: 03/16/2021, 1:17 PM  Clinical Narrative:                       -s/p lumbar laminectomy/lumbar fusion,9/13 NCM spoke pt regarding d/c planning. Pt lives alone. States PTA required min.assist with ADL's. States used rolling walker with ambulation. Pt states has a cane and a rolling walker @ home. States would a like a shower chair and BSC @ d/c.   Orders noted for home health services.  Pt agreeable to home health services. States she plan to recovery @ daughter Terri's residence : [address], [place] [address]. Pt without preference for home health agency. Referral made with [facility], acceptance pending.  TOC team will continue to monitor and assist with TOC needs...   Expected Discharge Plan: [place] Barriers to Discharge: Continued Medical Work up   Patient Goals and CMS Choice     Choice offered to / list presented to : Patient  Expected Discharge Plan and Services Expected Discharge Plan: [place]   Discharge Planning Services: CM Consult Post Acute Care Choice: [place] arrangements for the past 2 months: Single Family Home                                      Prior Living Arrangements/Services Living arrangements for the past 2 months: Single Family Home Lives with:: Self Patient language and need for interpreter reviewed:: Yes Do you feel safe going back to the place where you live?: Yes      Need for Family Participation in Patient Care: Yes (Comment) Care giver support system in place?: Yes (comment) Current home services: DME Criminal Activity/Legal Involvement Pertinent to Current Situation/Hospitalization: No - Comment as needed  Activities of Daily Living Home Assistive Devices/Equipment:  Eyeglasses, Dentures (specify type), CBG Meter, Blood pressure cuff, Walker (specify type) (Walk in shower) ADL Screening (condition at time of admission) Patient's cognitive ability adequate to safely complete daily activities?: Yes Is the patient deaf or have difficulty hearing?: No Does the patient have difficulty seeing, even when wearing glasses/contacts?: No Does the patient have difficulty concentrating, remembering, or making decisions?: No Patient able to express need for assistance with ADLs?: Yes Does the patient have difficulty dressing or bathing?: No Independently performs ADLs?: Yes (appropriate for developmental age) Does the patient have difficulty walking or climbing stairs?: Yes Weakness of Legs: Left Weakness of Arms/Hands: Left  Permission Sought/Granted Permission sought to share information with : Family Supports, Case Manager Permission granted to share information with : Yes, Verbal Permission Granted  Share Information with NAME: [name] (Daughter) [phone]           Emotional Assessment   Attitude/Demeanor/Rapport: Engaged Affect (typically observed): Accepting Orientation: : Oriented to Self, Oriented to Situation, Oriented to Place, Oriented to  Time Alcohol / Substance Use: Other (comment)    Admission diagnosis:  Fusion of spine of lumbar region [M43.26] Patient Active Problem List   Diagnosis Date Noted   Fusion of spine of lumbar region 03/15/2021   Cerebrovascular disease 12/20/2020   GERD (gastroesophageal reflux disease) 11/18/2020   Type 2 diabetes mellitus with stage 3b  chronic kidney disease, without long-term current use of insulin ([place]) 07/12/2020   Mixed hyperlipidemia 07/12/2020   Essential hypertension, benign 07/12/2020   Iron deficiency anemia    Loss of weight 04/09/2020   Dyspepsia 01/28/2020   Constipation 01/28/2020   Lower abdominal pain 01/28/2020   Normocytic anemia 01/28/2020   PCP:  No primary care provider on  file. Pharmacy:   [id number] [place], [id number]: [phone]: [phone]    Social Determinants of Health (SDOH) Interventions    Readmission Risk Interventions No flowsheet data found.

## 2021-03-16 NOTE — Progress Notes (Signed)
Inpatient Diabetes Program Recommendations  AACE/ADA: New Consensus Statement on Inpatient Glycemic Control (2015)  Target Ranges:  Prepandial:   less than 140 mg/dL      Peak postprandial:   less than 180 mg/dL (1-2 hours)      Critically ill patients:  140 - 180 mg/dL   Lab Results  Component Value Date   GLUCAP 304 (H) 03/16/2021   HGBA1C 7.5 (H) 03/15/2021    Review of Glycemic Control Results for Stephanie Moreno, Stephanie Moreno (MRN [id number]) as of 03/16/2021 09:57  Ref. Range 03/15/2021 06:29 03/15/2021 13:40 03/15/2021 16:27 03/16/2021 09:44  Glucose-Capillary Latest Ref Range: 70 - 99 mg/dL 92 180 (H) 216 (H) 304 (H)   Diabetes history: DM2 Outpatient Diabetes medications: Tresiba 40 units q hs , Glucotrol 5 mg XL qd, Trulicity 3 mg qweek Current orders for Inpatient glycemic control: Novolog 0-15 units tid  Inpatient Diabetes Program Recommendations:   -Add 50% home basal dose of Semglee 20 units daily -Add Novolog 3 units tid meal coverage if eats 50% -Add Novolog 0-5 units q hs Secure chat sent to Dr. Louanne Skye.  Thank you, Nani Gasser. Markale Birdsell, RN, MSN, CDE  Diabetes Coordinator Inpatient Glycemic Control Team Team Pager [phone] (8am-5pm) 03/16/2021 9:56 AM

## 2021-03-16 NOTE — Evaluation (Addendum)
Occupational Therapy Evaluation Patient Details Name: Stephanie Moreno MRN: [id number] DOB: 02-28-1946 Today's Date: 03/16/2021   History of Present Illness Pt is a 75 y/o female admitted 9/16 for low back pain and radiculopathy. S/P L4-5 and L5-S1 laminectomy, TLIF L L 5-S1.  PMH includes: anemia, anxiety, breast CA, DM, HTN, CKD, chronic back pain.   Clinical Impression   PTA patient reports independent with ADLs, limited IADLs and mobility using rollator.  She was admitted for above and presents with problem list below, including back pain, precautions, impaired balance and decreased activity tolerance. Educated on precautions, ADL compensatory techniques, recommendations and safety.  Requires min assist for bed mobility, transfers and limited mobility using rollator in room and up to max assist for LB Adls.  Pt with slow processing and decreased recall of precautions during session. Based on performance today and pt reporting not having assistance after dc, recommend continued OT services acutely and after dc at SNF level to optimize independence and safety with ADLs, mobility prior to dc home.  Will follow.      Recommendations for follow up therapy are one component of a multi-disciplinary discharge planning process, led by the attending physician.  Recommendations may be updated based on patient status, additional functional criteria and insurance authorization.   Follow Up Recommendations  SNF;Supervision/Assistance - 24 hour    Equipment Recommendations  Other (comment) (TBD)    Recommendations for Other Services       Precautions / Restrictions Precautions Precautions: Back Precaution Booklet Issued: Yes (comment) Precaution Comments: reviewed precautions Required Braces or Orthoses: Spinal Brace Spinal Brace: Lumbar corset;Applied in sitting position Restrictions Weight Bearing Restrictions: No      Mobility Bed Mobility Overal bed mobility: Needs Assistance Bed Mobility:  Rolling;Sidelying to Sit Rolling: Min assist Sidelying to sit: Min assist       General bed mobility comments: min assist to roll and transition trunk to upright position with cueing for log roll technique    Transfers Overall transfer level: Needs assistance Equipment used: 4-wheeled walker Transfers: Sit to/from Stand Sit to Stand: Min assist         General transfer comment: min assist to power up and steady from EOB and recliner with cueing for hand placement and posture    Balance Overall balance assessment: Needs assistance Sitting-balance support: No upper extremity supported;Feet supported Sitting balance-Leahy Scale: Fair     Standing balance support: Bilateral upper extremity supported;During functional activity Standing balance-Leahy Scale: Poor Standing balance comment: relies on BUE and external support                           ADL either performed or assessed with clinical judgement   ADL Overall ADL's : Needs assistance/impaired     Grooming: Set up;Sitting       Lower Body Bathing: Moderate assistance;Sit to/from stand   Upper Body Dressing : Min guard;Sitting   Lower Body Dressing: Maximal assistance;Sit to/from stand Lower Body Dressing Details (indicate cue type and reason): requires assist for socks, min assist sit to stand Toilet Transfer: Minimal assistance;Ambulation Toilet Transfer Details (indicate cue type and reason): rollator, simulated to recliner Toileting- Clothing Manipulation and Hygiene: Moderate assistance;Sit to/from stand       Functional mobility during ADLs: Minimal assistance (rollator) General ADL Comments: pt limited by back pain, precautions, decreased activity tolerance, and balance     Vision         Perception  Praxis      Pertinent Vitals/Pain Pain Assessment: Faces Faces Pain Scale: Hurts even more Pain Location: back Pain Descriptors / Indicators: Discomfort;Operative site  guarding Pain Intervention(s): Limited activity within patient's tolerance;Monitored during session;Repositioned     Hand Dominance Right   Extremity/Trunk Assessment Upper Extremity Assessment Upper Extremity Assessment: Overall WFL for tasks assessed   Lower Extremity Assessment Lower Extremity Assessment: Defer to PT evaluation   Cervical / Trunk Assessment Cervical / Trunk Assessment: Other exceptions Cervical / Trunk Exceptions: s/p back surgery   Communication Communication Communication: No difficulties   Cognition Arousal/Alertness: Awake/alert Behavior During Therapy: WFL for tasks assessed/performed Overall Cognitive Status: Impaired/Different from baseline Area of Impairment: Problem solving;Memory                     Memory: Decreased recall of precautions       Problem Solving: Slow processing;Requires verbal cues General Comments: decreased recall of precautions and slow processing   General Comments       Exercises     Shoulder Instructions      Home Living Family/patient expects to be discharged to:: Private residence Living Arrangements: Alone Available Help at Discharge: Family;Available PRN/intermittently Type of Home: Apartment Home Access: Level entry     Home Layout: One level     Bathroom Shower/Tub: Occupational psychologist: Standard (3:1 BSC over toilet)     Home Equipment: Shower seat;Grab bars - tub/shower;Walker - 4 wheels;Bedside commode;Grab bars - toilet   Additional Comments: option to go to her daughters home, but daughter works during day-- 3 steps, 1 level, tub shower      Prior Functioning/Environment Level of Independence: Independent with assistive device(s)        Comments: uses rollator for mobility, limited IADLs (family assists as needed). reports 3 falls in shower in the last 6 months        OT Problem List: Decreased strength;Decreased activity tolerance;Impaired balance (sitting and/or  standing);Pain;Decreased knowledge of precautions;Decreased knowledge of use of DME or AE;Decreased safety awareness;Decreased cognition      OT Treatment/Interventions: Self-care/ADL training;DME and/or AE instruction;Therapeutic activities;Balance training;Patient/family education;Therapeutic exercise    OT Goals(Current goals can be found in the care plan section) Acute Rehab OT Goals Patient Stated Goal: to get stronger OT Goal Formulation: With patient Time For Goal Achievement: 03/30/21 Potential to Achieve Goals: Good  OT Frequency: Min 2X/week   Barriers to D/C:            Co-evaluation              AM-PAC OT "6 Clicks" Daily Activity     Outcome Measure Help from another person eating meals?: None Help from another person taking care of personal grooming?: A Little Help from another person toileting, which includes using toliet, bedpan, or urinal?: A Lot Help from another person bathing (including washing, rinsing, drying)?: A Lot Help from another person to put on and taking off regular upper body clothing?: A Little Help from another person to put on and taking off regular lower body clothing?: A Lot 6 Click Score: 16   End of Session Equipment Utilized During Treatment: Rolling walker;Back brace Nurse Communication: Mobility status;Precautions  Activity Tolerance: Patient tolerated treatment well Patient left: in chair;with call bell/phone within reach;with chair alarm set  OT Visit Diagnosis: Other abnormalities of gait and mobility (R26.89);Muscle weakness (generalized) (M62.81);Pain Pain - part of body:  (back)  Time: [id number] OT Time Calculation (min): 51 min Charges:  OT General Charges $OT Visit: 1 Visit OT Evaluation $OT Eval Moderate Complexity: 1 Mod OT Treatments $Self Care/Home Management : 38-52 mins  Jolaine Artist, OT Acute Rehabilitation Services Pager [phone] Office [phone]   Delight Stare 03/16/2021, 1:58  PM

## 2021-03-16 NOTE — Evaluation (Signed)
Physical Therapy Evaluation Patient Details Name: Stephanie Moreno MRN: [id number] DOB: 1946-01-28 Today's Date: 03/16/2021  History of Present Illness  Pt is a 75 y/o female admitted 9/13 for low back pain and radiculopathy. S/P L4-5 and L5-S1 laminectomy, TLIF L L 5-S1. Had small CSF leak but okay to mobilize per Ortho.  PMH includes: anemia, anxiety, breast CA, DM, HTN, CKD, chronic back pain.  Clinical Impression  Pt. Was previously mod IND and living alone. After spinal surgery, pt. Is demonstrating difficulty with transfers, gait deviations, dec balance, and dec activity tolerance and would benefit from skilled PT in acute care to address deficits indicated in initial eval.   Recommendations for follow up therapy are one component of a multi-disciplinary discharge planning process, led by the attending physician.  Recommendations may be updated based on patient status, additional functional criteria and insurance authorization.  Follow Up Recommendations SNF;Follow surgeon's recommendation for DC plan and follow-up therapies    Equipment Recommendations  Rolling walker with 5" wheels    Recommendations for Other Services       Precautions / Restrictions Precautions Precautions: Back Precaution Booklet Issued: Yes (comment) (Already issued by OT; handout seen in room) Precaution Comments: reviewed precautions Required Braces or Orthoses: Spinal Brace Spinal Brace: Lumbar corset Restrictions Weight Bearing Restrictions: No      Mobility  Bed Mobility Overal bed mobility: Needs Assistance Bed Mobility: Rolling;Sidelying to Sit Rolling: Min assist Sidelying to sit: Min assist       General bed mobility comments: Bed mobility not assessed at this time as pt. states she moved OOB to chair due to back discomfort in bed.  Pt. is educated on log rolling and demos understanding.  Pt. is encouraged to practice this technique on return BTB. Patient Response:  Cooperative  Transfers Overall transfer level: Needs assistance Equipment used: Rolling walker (2 wheeled) Transfers: Sit to/from Stand Sit to Stand: Min assist         General transfer comment: Pt. able to scoot to edge of chair with mod IND.  Performs sit > stand with min A with VCs for safety with hand placement during transfer.  Pt. encouraged to use 2WW instead of 4WW due to risk of falling.  Pt. takes inc time to complete transfer and demos fair standing balance.  On return to chair pt. demos difficulty reaching both hands back to chair hand rails.  With one hand on chair handle and 1 on RW, pt. req's min A for eccentric control to sit.  Ambulation/Gait Ambulation/Gait assistance: Min assist Gait Distance (Feet): 8 Feet Assistive device: Rolling walker (2 wheeled) Gait Pattern/deviations: Step-to pattern;Decreased stride length     General Gait Details: Pt. demos slow cadence, dec step length and labored breathing with activity.  Pt. is unsteady with gait and unsafe to progress past 4 ft at this time without a chair follow.  Pt. has tendency to walk too close to front of RW and is educated to stay towards middle of RW to prevent LOB.  After 4 ft, pt. is educated on how to properly negotiate RW to take steps posteriorly back to chair.  Pt. c/o inc pain with activity.  Stairs            Wheelchair Mobility    Modified Rankin (Stroke Patients Only)       Balance Overall balance assessment: Needs assistance Sitting-balance support: No upper extremity supported;Feet supported Sitting balance-Leahy Scale: Fair     Standing balance support: Bilateral upper extremity  supported;During functional activity Standing balance-Leahy Scale: Poor Standing balance comment: relies on BUE and external support                             Pertinent Vitals/Pain Pain Assessment: 0-10 Pain Score: 7  Faces Pain Scale: Hurts even more Pain Location: Low back pain,  escpecially with bed mobility and sit > stand per patient. Pain Descriptors / Indicators: Aching;Discomfort;Radiating;Burning Pain Intervention(s): Limited activity within patient's tolerance    Home Living Family/patient expects to be discharged to:: Private residence Living Arrangements: Alone Available Help at Discharge: Family Type of Home: House Home Access: Level entry     Home Layout: One level Home Equipment: Environmental consultant - 4 wheels Additional Comments: Pt. states that she was going to go to daughter's house to stay during recovery, but now feels that she needs rehab.    Prior Function Level of Independence: Independent with assistive device(s)         Comments: PLOF mod IND with use of 4WW; daughter would come approx every 2 weeks to assist with more difficult bathing activities like reaching her back and lower legs.  Pt. states she's been trying to get inc assist in home since April, but has been unable.  Does report hx of 3 falls in last 6 months due to L LE radiculopathy.     Hand Dominance   Dominant Hand: Right    Extremity/Trunk Assessment   Upper Extremity Assessment Upper Extremity Assessment: Defer to OT evaluation    Lower Extremity Assessment Lower Extremity Assessment: LLE deficits/detail LLE Deficits / Details: Grossly 3-/5; states L LE was weaker prior to sx as well.    Cervical / Trunk Assessment Cervical / Trunk Assessment: Other exceptions Cervical / Trunk Exceptions: s/p back surgery  Communication   Communication: No difficulties  Cognition Arousal/Alertness: Awake/alert Behavior During Therapy: WFL for tasks assessed/performed Overall Cognitive Status: Within Functional Limits for tasks assessed Area of Impairment: Problem solving;Memory                     Memory: Decreased recall of precautions       Problem Solving: Slow processing;Requires verbal cues General Comments: Agreeable to work with PT      General Comments  General comments (skin integrity, edema, etc.): Pt. is educated on lumbar precautions, demos understanding.    Exercises     Assessment/Plan    PT Assessment Patient needs continued PT services  PT Problem List Decreased strength;Decreased mobility;Decreased safety awareness;Decreased knowledge of precautions;Decreased activity tolerance;Decreased balance;Pain       PT Treatment Interventions DME instruction;Gait training;Balance training;Therapeutic exercise;Functional mobility training;Therapeutic activities;Patient/family education    PT Goals (Current goals can be found in the Care Plan section)  Acute Rehab PT Goals Patient Stated Goal: Pt's goal is to regain strength in L LE and be able to walk. PT Goal Formulation: With patient Time For Goal Achievement: 03/30/21 Potential to Achieve Goals: Good    Frequency Min 5X/week   Barriers to discharge   Pt. demos dec safety with functional mobility and dec activity tolerance and would benefit from skilled placement prior to return home.    Co-evaluation               AM-PAC PT "6 Clicks" Mobility  Outcome Measure Help needed turning from your back to your side while in a flat bed without using bedrails?: A Little Help needed moving from lying on your  back to sitting on the side of a flat bed without using bedrails?: A Little Help needed moving to and from a bed to a chair (including a wheelchair)?: A Little Help needed standing up from a chair using your arms (e.g., wheelchair or bedside chair)?: A Little Help needed to walk in hospital room?: A Little Help needed climbing 3-5 steps with a railing? : A Lot 6 Click Score: 17    End of Session Equipment Utilized During Treatment: Gait belt;Back brace Activity Tolerance: Patient limited by pain Patient left: in chair;with call bell/phone within reach;with chair alarm set   PT Visit Diagnosis: Unsteadiness on feet (R26.81);History of falling (Z91.81)    Time:  [id number] PT Time Calculation (min) (ACUTE ONLY): 28 min   Charges:   PT Evaluation $PT Eval Low Complexity: 1 Low PT Treatments $Gait Training: 8-22 mins       Zackory Pudlo A. Trudee Chirino, PT, DPT Acute Rehabilitation Services Office: [place] 03/16/2021, 3:04 PM

## 2021-03-17 ENCOUNTER — Inpatient Hospital Stay (HOSPITAL_COMMUNITY): Payer: Medicare Other

## 2021-03-17 ENCOUNTER — Ambulatory Visit (INDEPENDENT_AMBULATORY_CARE_PROVIDER_SITE_OTHER): Payer: Medicare Other | Admitting: Internal Medicine

## 2021-03-17 ENCOUNTER — Encounter (INDEPENDENT_AMBULATORY_CARE_PROVIDER_SITE_OTHER): Payer: Self-pay

## 2021-03-17 DIAGNOSIS — M4326 Fusion of spine, lumbar region: Secondary | ICD-10-CM

## 2021-03-17 LAB — CBC WITH DIFFERENTIAL/PLATELET
Abs Immature Granulocytes: 0.08 10*3/uL — ABNORMAL HIGH (ref 0.00–0.07)
Basophils Absolute: 0 10*3/uL (ref 0.0–0.1)
Basophils Relative: 0 %
Eosinophils Absolute: 0.1 10*3/uL (ref 0.0–0.5)
Eosinophils Relative: 1 %
HCT: 30.2 % — ABNORMAL LOW (ref 36.0–46.0)
Hemoglobin: 10 g/dL — ABNORMAL LOW (ref 12.0–15.0)
Immature Granulocytes: 1 %
Lymphocytes Relative: 18 %
Lymphs Abs: 2.2 10*3/uL (ref 0.7–4.0)
MCH: 28.8 pg (ref 26.0–34.0)
MCHC: 33.1 g/dL (ref 30.0–36.0)
MCV: 87 fL (ref 80.0–100.0)
Monocytes Absolute: 0.7 10*3/uL (ref 0.1–1.0)
Monocytes Relative: 6 %
Neutro Abs: 9 10*3/uL — ABNORMAL HIGH (ref 1.7–7.7)
Neutrophils Relative %: 74 %
Platelets: 186 10*3/uL (ref 150–400)
RBC: 3.47 MIL/uL — ABNORMAL LOW (ref 3.87–5.11)
RDW: 14 % (ref 11.5–15.5)
WBC: 12.1 10*3/uL — ABNORMAL HIGH (ref 4.0–10.5)
nRBC: 0 % (ref 0.0–0.2)

## 2021-03-17 LAB — BASIC METABOLIC PANEL
Anion gap: 8 (ref 5–15)
BUN: 18 mg/dL (ref 8–23)
CO2: 20 mmol/L — ABNORMAL LOW (ref 22–32)
Calcium: 7.8 mg/dL — ABNORMAL LOW (ref 8.9–10.3)
Chloride: 108 mmol/L (ref 98–111)
Creatinine, Ser: 1.39 mg/dL — ABNORMAL HIGH (ref 0.44–1.00)
GFR, Estimated: 40 mL/min — ABNORMAL LOW (ref >60)
Glucose, Bld: 122 mg/dL — ABNORMAL HIGH (ref 70–99)
Potassium: 3.9 mmol/L (ref 3.5–5.1)
Sodium: 136 mmol/L (ref 135–145)

## 2021-03-17 LAB — GLUCOSE, CAPILLARY
Glucose-Capillary: 101 mg/dL — ABNORMAL HIGH (ref 70–99)
Glucose-Capillary: 197 mg/dL — ABNORMAL HIGH (ref 70–99)
Glucose-Capillary: 153 mg/dL — ABNORMAL HIGH (ref 70–99)
Glucose-Capillary: 106 mg/dL — ABNORMAL HIGH (ref 70–99)

## 2021-03-17 LAB — BRAIN NATRIURETIC PEPTIDE: B Natriuretic Peptide: 54 pg/mL (ref 0.0–100.0)

## 2021-03-17 MED ORDER — TRULICITY 3 MG/0.5ML ~~LOC~~ SOAJ: 3.0000 mg | SUBCUTANEOUS | 0 refills | Status: DC

## 2021-03-17 MED ORDER — BD PEN NEEDLE SHORT U/F 31G X 8 MM MISC: 3 refills | Status: DC

## 2021-03-17 MED ORDER — ALBUTEROL SULFATE (2.5 MG/3ML) 0.083% IN NEBU: 2.5000 mg | INHALATION_SOLUTION | Freq: Four times a day (QID) | RESPIRATORY_TRACT | Status: DC | PRN

## 2021-03-17 MED ORDER — POLYETHYLENE GLYCOL 3350 17 G PO PACK
17.0000 g | PACK | Freq: Every day | ORAL | Status: DC
  Administered 2021-03-18 – 2021-03-19 (×2): 17 g via ORAL
  Filled 2021-03-17 (×2): qty 1

## 2021-03-17 MED ORDER — METOCLOPRAMIDE HCL 5 MG PO TABS
5.0000 mg | ORAL_TABLET | Freq: Three times a day (TID) | ORAL | Status: DC
  Administered 2021-03-17 – 2021-03-19 (×9): 5 mg via ORAL
  Filled 2021-03-17 (×9): qty 1

## 2021-03-17 MED ORDER — AMLODIPINE BESYLATE 5 MG PO TABS
5.0000 mg | ORAL_TABLET | Freq: Every day | ORAL | Status: DC
  Administered 2021-03-18 – 2021-03-19 (×2): 5 mg via ORAL
  Filled 2021-03-17 (×2): qty 1

## 2021-03-17 MED ORDER — SODIUM CHLORIDE 0.9 % IV SOLN: INTRAVENOUS | Status: DC

## 2021-03-17 NOTE — TOC Initial Note (Addendum)
Transition of Care [facility]) - Initial/Assessment Note    Patient Details  Name: Stephanie Moreno MRN: [id number] Date of Birth: January 11, 1946  [facility]) CM/SW Contact:    Milinda Antis, [place] Phone Number: 03/17/2021, 12:46 PM  Clinical Narrative:                  CSW received consult for possible SNF placement at time of discharge. CSW spoke with patient. Patient reported that she lives alone and her daughter works during the day so no one would be home to assist the patient during the day.  Patient expressed understanding of PT recommendation and is agreeable to SNF placement at time of discharge. Patient reports preference for [facility]. CSW discussed insurance authorization process and will provide Medicare SNF ratings list. Patient has received 2 COVID vaccines. No further questions reported at this time.   12:50- CSW called the patient's daughter, Stephanie Moreno, and updated her the patient's decision and the insurance authorization process.    12:58-  CSW requested that TOC CMA's begin the insurance authorization process.  Skilled Nursing Rehab Facilities-   [email or web address] Ratings out of 5 possible    Name Address  Phone # [place] Inspection Overall  [facility] [address], [place] '5 1 4 4  '$ [facility]  [address], [place] [phone] '3 1 5 4  '$ [facility] [place], [place] '3 1 1 1  '$ [facility], [place] '2 2 4 4  '$ [facility] [address] [place] '3 1 2 1  '$ [place] '3 2 4 4  '$ [facility] [address], [place] '5 1 2 2  '$ [facility] [address], [place] '5 2 2 3  '$ [facility] '5 1 2 2  '$ [facility] [phone],  [place] [phone] '5 1 2 2  '$ [facility] [address], [facility] [phone] '5 1 2 2  '$ [place] [facility] [id number] '3 1 1 1    '$ Expected Discharge Plan: [place] Barriers to Discharge: Continued Medical Work up   Patient Goals and CMS Choice     Choice offered to / list presented to : Patient  Expected Discharge Plan and Services Expected Discharge Plan: [place]   Discharge Planning Services: CM Consult Post Acute Care Choice: [place] arrangements for the past 2 months: Single Family Home                                      Prior Living Arrangements/Services Living arrangements for the past 2 months: Single Family Home Lives with:: Self Patient language and need for interpreter reviewed:: Yes Do you feel safe going back to the place where you live?: Yes      Need for Family Participation in Patient Care: Yes (Comment) Care giver support system in place?: Yes (comment) Current home services: DME Criminal Activity/Legal Involvement Pertinent to Current Situation/Hospitalization: No - Comment as needed  Activities of Daily Living Home Assistive Devices/Equipment: Eyeglasses, Dentures (specify type), CBG Meter, Blood pressure cuff, Walker (specify type) (Walk in shower) ADL Screening (condition at time of admission) Patient's cognitive ability adequate to safely complete daily activities?: Yes  Is the patient deaf or have difficulty hearing?: No Does the patient have difficulty seeing, even when wearing glasses/contacts?: No Does the patient have difficulty concentrating, remembering, or making decisions?: No Patient able to express need for assistance with ADLs?: Yes Does the patient have difficulty dressing or bathing?: No Independently performs ADLs?: Yes (appropriate for developmental age) Does the patient have difficulty walking or climbing stairs?: Yes Weakness  of Legs: Left Weakness of Arms/Hands: Left  Permission Sought/Granted Permission sought to share information with : Family Supports, Case Manager Permission granted to share information with : Yes, Verbal Permission Granted  Share Information with NAME: Stephanie Moreno (Daughter) [phone]           Emotional Assessment   Attitude/Demeanor/Rapport: Engaged Affect (typically observed): Accepting Orientation: : Oriented to Self, Oriented to Situation, Oriented to Place, Oriented to  Time Alcohol / Substance Use: Other (comment)    Admission diagnosis:  Fusion of spine of lumbar region [M43.26] Patient Active Problem List   Diagnosis Date Noted   Fusion of spine of lumbar region 03/15/2021   Cerebrovascular disease 12/20/2020   GERD (gastroesophageal reflux disease) 11/18/2020   Type 2 diabetes mellitus with stage 3b chronic kidney disease, without long-term current use of insulin ([place]) 07/12/2020   Mixed hyperlipidemia 07/12/2020   Essential hypertension, benign 07/12/2020   Iron deficiency anemia    Loss of weight 04/09/2020   Dyspepsia 01/28/2020   Constipation 01/28/2020   Lower abdominal pain 01/28/2020   Normocytic anemia 01/28/2020   PCP:  No primary care provider on file. Pharmacy:   [id number] [id number]: [phone]: [phone]    Social Determinants of Health (SDOH) Interventions    Readmission Risk Interventions No flowsheet data found.

## 2021-03-17 NOTE — Progress Notes (Signed)
Physical Therapy Treatment Patient Details Name: Stephanie Moreno MRN: [id number] DOB: [birth year] Today's Date: 03/17/2021   History of Present Illness Pt is a 75 y/o female admitted 9/13 for low back pain and radiculopathy. S/P L4-5 and L5-S1 laminectomy, TLIF L L 5-S1. Had small CSF leak but okay to mobilize per Ortho.  PMH includes: anemia, anxiety, breast CA, DM, HTN, CKD, chronic back pain.    PT Comments    Patient received in bed, pleasant and cooperative but definitely much different than yesterday- had slow processing/needed repeated cues, could not remember back precautions, could not swallow food or repeat short statements back to PT accurately, and unable to attempt gait due to BLEs buckling. Required +2 assist and use of stedy for safety. Able to transfer to/from [facility] using lift. As we were positioning her back in bed, Dr. Rogers Blocker arrived and I discussed PT concerns with her with differences in presentation between today and yesterday. Left in bed with alarm active in care of MD- will continue to follow.    Recommendations for follow up therapy are one component of a multi-disciplinary discharge planning process, led by the attending physician.  Recommendations may be updated based on patient status, additional functional criteria and insurance authorization.  Follow Up Recommendations  SNF     Equipment Recommendations  Rolling walker with 5" wheels    Recommendations for Other Services       Precautions / Restrictions Precautions Precautions: Back Precaution Booklet Issued: Yes (comment) Precaution Comments: reviewed precautions Required Braces or Orthoses: Spinal Brace Spinal Brace: Lumbar corset;Applied in sitting position Restrictions Weight Bearing Restrictions: No     Mobility  Bed Mobility Overal bed mobility: Needs Assistance Bed Mobility: Rolling;Sidelying to Sit;Sit to Sidelying Rolling: Min assist Sidelying to sit: Max assist     Sit to sidelying: Max  assist;+2 for physical assistance General bed mobility comments: had a harder time getting to EOB this afternoon, needed MaxA to boost to midline sitting position and had multidirectional balance loss once up. Used MaxAx2 to return to bed just for time management with hospitalist having arrived to examine patient/imaging returning shortly    Transfers Overall transfer level: Needs assistance Equipment used: Rolling walker (2 wheeled) Transfers: Sit to/from Stand Sit to Stand: Max assist         General transfer comment: ModAx2 or MaxAx1 to boost up to standing position  with RW; unable to take pivotal steps due to BLEs buckling. Used Stedy to perform all pivot transfers today for safety.  Ambulation/Gait             General Gait Details: unable- safety, BLEs buckling when not using stedy   Stairs             Wheelchair Mobility    Modified Rankin (Stroke Patients Only)       Balance Overall balance assessment: Needs assistance Sitting-balance support: No upper extremity supported;Feet supported;Bilateral upper extremity supported Sitting balance-Leahy Scale: Poor Sitting balance - Comments: multidirectional unsteadiness   Standing balance support: Bilateral upper extremity supported;During functional activity Standing balance-Leahy Scale: Poor Standing balance comment: reliant on external support                            Cognition Arousal/Alertness: Awake/alert Behavior During Therapy: Flat affect Overall Cognitive Status: Impaired/Different from baseline Area of Impairment: Problem solving;Memory;Following commands;Awareness  Memory: Decreased recall of precautions Following Commands: Follows one step commands consistently;Follows one step commands with increased time;Follows multi-step commands inconsistently   Awareness: Emergent Problem Solving: Slow processing;Requires verbal cues;Decreased  initiation;Difficulty sequencing General Comments: unable to recall precautions and needed cues/explanation; slow processing and needed ongoing cues for problem solving as well as simple commands.      Exercises      General Comments General comments (skin integrity, edema, etc.): Dr. 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[facility]" and could not swallow fish/had to spit it out)      Pertinent Vitals/Pain Pain Assessment: Faces Faces Pain Scale: Hurts little more Pain Location: back, L hip Pain Descriptors / Indicators: Aching;Discomfort;Radiating;Burning;Operative site guarding Pain Intervention(s): Limited activity within patient's tolerance;Monitored during session;Repositioned    Home Living                      Prior Function            PT Goals (current goals can now be found in the care plan section) Acute Rehab PT Goals Patient Stated Goal: to get better PT Goal Formulation: With patient Time For Goal Achievement: 03/30/21 Potential to Achieve Goals: Good Progress towards PT goals: Not progressing toward goals - comment (change in functional presentation)    Frequency    Min 5X/week      PT Plan Current plan remains appropriate    Co-evaluation              AM-PAC PT "6 Clicks" Mobility   Outcome Measure  Help needed turning from your back to your side while in a flat bed without using bedrails?: A Little Help needed moving from lying on your back to sitting on the side of a flat bed without using bedrails?: A Lot Help needed moving to and from a bed to a chair (including a wheelchair)?: A Lot Help needed standing up from a chair using your arms (e.g., wheelchair or bedside chair)?: A Lot Help needed to walk in hospital room?: Total Help needed climbing 3-5 steps with a railing? :  Total 6 Click Score: 11    End of Session Equipment Utilized During Treatment: Gait belt;Back brace Activity Tolerance: Patient tolerated treatment well Patient left: in bed;with call bell/phone within reach;with bed alarm set;Other (comment) (MD present and attending) Nurse Communication: Mobility status PT Visit Diagnosis: Unsteadiness on feet (R26.81);History of falling (Z91.81)     Time: [id number] PT Time Calculation (min) (ACUTE ONLY): 33 min  Charges:  $Therapeutic Activity: 23-37 mins                    Windell Norfolk, DPT, PN2   Supplemental Physical Therapist [place]    Pager [phone] Acute Rehab Office [phone]

## 2021-03-17 NOTE — Plan of Care (Signed)
Problem: Clinical Measurements: Goal: Ability to maintain clinical measurements within normal limits will improve Outcome: Progressing Goal: Will remain free from infection Outcome: Progressing Goal: Respiratory complications will improve Outcome: Progressing   Problem: Activity: Goal: Risk for activity intolerance will decrease Outcome: Progressing   

## 2021-03-17 NOTE — Addendum Note (Signed)
Addended by: Ellin Saba on: 03/17/2021 02:59 PM   Modules accepted: Orders

## 2021-03-17 NOTE — Progress Notes (Signed)
     Subjective: 2 Days Post-Op Procedure(s) (LRB): CENTRAL LAMINECTOMY LUMBAR FOUR-FIVE  AND LUMBAR FIVE-SACRAL ONE,TRANSFORAMINAL LUMBAR INTERBODY FUSION LEFT LUMBAR FIVE-SACRAL ONE WITH GLOBUS PEDICLE SCREWS, RODS AND SABLE CAGE, LOCAL BONE GRAFT, ALLOGRAFT BONE GRAFT (N/A) Awake, alert and oriented x 4. Wheezes,  BP is decreased today creatinine is slightly elevated, Hgb is stable at 10. Given '40mg'$  lasix yesterday as she takes intermittant lasix 40-80 mg daily for swelling or fluid retention. Abdomen is distended.  Patient reports pain as moderate.    Objective:   VITALS:  Temp:  [98 F (36.7 C)-99.2 F (37.3 C)] 98.7 F (37.1 C) (09/15 1123) Pulse Rate:  [90-98] 95 (09/15 1123) Resp:  [14-18] 14 (09/15 1123) BP: (110-140)/(55-69) 111/55 (09/15 1123) SpO2:  [92 %-100 %] 99 % (09/15 1123)  Neurologically intact ABD soft Neurovascular intact Sensation intact distally Intact pulses distally Dorsiflexion/Plantar flexion intact Incision: dressing C/D/I and scant drainage   LABS Recent Labs    03/16/21 0102 03/17/21 0155  HGB 9.9* 10.0*  WBC 11.3* 12.1*  PLT 196 186   Recent Labs    03/16/21 0102 03/17/21 0155  NA 132* 136  K 4.0 3.9  CL 103 108  CO2 18* 20*  BUN 20 18  CREATININE 1.42* 1.39*  GLUCOSE 397* 122*   No results for input(s): LABPT, INR in the last 72 hours.   Assessment/Plan: 2 Days Post-Op Procedure(s) (LRB): CENTRAL LAMINECTOMY LUMBAR FOUR-FIVE  AND LUMBAR FIVE-SACRAL ONE,TRANSFORAMINAL LUMBAR INTERBODY FUSION LEFT LUMBAR FIVE-SACRAL ONE WITH GLOBUS PEDICLE SCREWS, RODS AND SABLE CAGE, LOCAL BONE GRAFT, ALLOGRAFT BONE GRAFT (N/A)  Up with therapy She is normally hypertensive so will ask for consult with hospitalists to assess as she had some desaturation with PT today and is wheezing.  Will order CXR and KUB to assess pulmonary and assess for ilieus. May be 3rd spacing with ileus and may need fluid. Will decreased sat and wheezing concerned  about giving to much fluid.  Internal medicine consult requested.   Basil Dess 03/17/2021, 1:46 PM Patient ID: Stephanie Moreno, female   DOB: Sep 17, 1945, 75 y.o.   MRN: [id number]

## 2021-03-17 NOTE — Progress Notes (Signed)
Patient ID: Stephanie Moreno, female   DOB: April 18, 1946, 75 y.o.   MRN: [id number] [facility] with bilateral basilar streaky atelectasis, will cough and use IS every hour while awake. Consider BIPAP. She has had evaluation for  Obstructive upper airway and sleep apnea in the past and reportedly is on a waiting list for a machine.   KUB large amount of stool in the rectum, multiple loops of bowel gas than can be seen in post operative ileus.   Likely 3rd spacing of fluid into bowel causing hypovolemia, lasix not helping. Will restart IVF stop lasix and cozzar.  Internal Medicine consult for diabetes and reactive airway, ?sleep apnea with atelectasis on PCXR.

## 2021-03-17 NOTE — Telephone Encounter (Signed)
Tried to call pt and pt's sister but was unable to get an answer nor leave a message.

## 2021-03-17 NOTE — NC FL2 (Signed)
[place] LEVEL OF CARE SCREENING TOOL     IDENTIFICATION  Patient Name: Stephanie Moreno Birthdate: 30-Oct-1945 Sex: female Admission Date (Current Location): 03/15/2021  [facility] and [place] Number:  Herbalist and Address:  [facility], [place] [address] [place], [place] [address]      Provider Number: [id number]  Attending Physician Name and Address:  Jessy Oto, MD  Relative Name and Phone Number:  Evonnie Pat (Sister)   [phone]    Current Level of Care: Hospital Recommended Level of Care: [place] Prior Approval Number:    Date Approved/Denied:   PASRR Number: [id number]  Discharge Plan: SNF    Current Diagnoses: Patient Active Problem List   Diagnosis Date Noted   Fusion of spine of lumbar region 03/15/2021   Cerebrovascular disease 12/20/2020   GERD (gastroesophageal reflux disease) 11/18/2020   Type 2 diabetes mellitus with stage 3b chronic kidney disease, without long-term current use of insulin ([place]) 07/12/2020   Mixed hyperlipidemia 07/12/2020   Essential hypertension, benign 07/12/2020   Iron deficiency anemia    Loss of weight 04/09/2020   Dyspepsia 01/28/2020   Constipation 01/28/2020   Lower abdominal pain 01/28/2020   Normocytic anemia 01/28/2020    Orientation RESPIRATION BLADDER Height & Weight     Time, Situation, Place, Self  Normal Continent, External catheter Weight:   Height:     BEHAVIORAL SYMPTOMS/MOOD NEUROLOGICAL BOWEL NUTRITION STATUS      Continent Diet (see d/c summary)  AMBULATORY STATUS COMMUNICATION OF NEEDS Skin   Limited Assist Verbally Surgical wounds                       Personal Care Assistance Level of Assistance  Bathing, Feeding, Dressing Bathing Assistance: Limited assistance Feeding assistance: Independent Dressing Assistance: Limited assistance     Functional Limitations Info  Speech, Hearing, Sight Sight Info: Adequate Hearing Info:  Adequate Speech Info: Adequate    SPECIAL CARE FACTORS FREQUENCY  PT (By licensed PT), OT (By licensed OT)     PT Frequency: 5x/ week OT Frequency: 5x/ week            Contractures Contractures Info: Not present    Additional Factors Info  Code Status, Allergies, Insulin Sliding Scale Code Status Info: Full Allergies Info: Codeine   Hydrochlorothiazide   Lisinopril   Oxybutynin   Penicillins   Insulin Sliding Scale Info: see d/c med list       Current Medications (03/17/2021):  This is the current hospital active medication list Current Facility-Administered Medications  Medication Dose Route Frequency Provider Last Rate Last Admin   acetaminophen (TYLENOL) tablet 650 mg  650 mg Oral Q4H PRN Jessy Oto, MD       Or   acetaminophen (TYLENOL) suppository 650 mg  650 mg Rectal Q4H PRN Jessy Oto, MD       alum & mag hydroxide-simeth (MAALOX/MYLANTA) 200-200-20 MG/5ML suspension 30 mL  30 mL Oral Q6H PRN Jessy Oto, MD       amitriptyline (ELAVIL) tablet 25 mg  25 mg Oral QHS Jessy Oto, MD   25 mg at 03/16/21 2209   amLODipine (NORVASC) tablet 5 mg  5 mg Oral Daily Jessy Oto, MD   5 mg at 03/16/21 0944   bisacodyl (DULCOLAX) EC tablet 5 mg  5 mg Oral Daily PRN Jessy Oto, MD       budesonide (PULMICORT) nebulizer  solution 0.25 mg  0.25 mg Nebulization BID Jessy Oto, MD   0.25 mg at 03/17/21 [id number]   Chlorhexidine Gluconate Cloth 2 % PADS 6 each  6 each Topical Daily Jessy Oto, MD   6 each at 03/17/21 1144   docusate sodium (COLACE) capsule 100 mg  100 mg Oral BID Jessy Oto, MD   100 mg at 03/17/21 0851   furosemide (LASIX) tablet 40 mg  40 mg Oral Daily Jessy Oto, MD   40 mg at 03/17/21 0850   gabapentin (NEURONTIN) capsule 300 mg  300 mg Oral TID Jessy Oto, MD   300 mg at 03/17/21 0851   HYDROcodone-acetaminophen (NORCO) 10-325 MG per tablet 1 tablet  1 tablet Oral Q4H PRN Jessy Oto, MD   1 tablet at 03/17/21 0857    HYDROcodone-acetaminophen (NORCO) 7.5-325 MG per tablet 1 tablet  1 tablet Oral Q6H Jessy Oto, MD   1 tablet at 03/17/21 1156   HYDROcodone-acetaminophen (NORCO) 7.5-325 MG per tablet 2 tablet  2 tablet Oral Q4H PRN Jessy Oto, MD       insulin aspart (novoLOG) injection 0-15 Units  0-15 Units Subcutaneous TID WC Jessy Oto, MD   3 Units at 03/16/21 1634   insulin aspart (novoLOG) injection 3 Units  3 Units Subcutaneous TID WC Jessy Oto, MD   3 Units at 03/17/21 0910   insulin aspart (novoLOG) injection 3 Units  3 Units Subcutaneous QHS Jessy Oto, MD       insulin glargine-yfgn [facility]) injection 20 Units  20 Units Subcutaneous QHS Jessy Oto, MD   20 Units at 03/16/21 2213   linaclotide (LINZESS) capsule 145 mcg  145 mcg Oral QAC breakfast Jessy Oto, MD   145 mcg at 03/17/21 0856   losartan (COZAAR) tablet 100 mg  100 mg Oral Daily Jessy Oto, MD   100 mg at 03/16/21 [id number]   meclizine (ANTIVERT) tablet 25 mg  25 mg Oral TID PRN Jessy Oto, MD       menthol-cetylpyridinium (CEPACOL) lozenge 3 mg  1 lozenge Oral PRN Jessy Oto, MD       Or   phenol (CHLORASEPTIC) mouth spray 1 spray  1 spray Mouth/Throat PRN Jessy Oto, MD       methocarbamol (ROBAXIN) tablet 500 mg  500 mg Oral Q6H PRN Jessy Oto, MD       Or   methocarbamol (ROBAXIN) 500 mg in dextrose 5 % 50 mL IVPB  500 mg Intravenous Q6H PRN Jessy Oto, MD       ondansetron [facility]) tablet 4 mg  4 mg Oral Q6H PRN Jessy Oto, MD       Or   ondansetron [facility]) injection 4 mg  4 mg Intravenous Q6H PRN Jessy Oto, MD   4 mg at 03/16/21 1631   pantoprazole (PROTONIX) EC tablet 40 mg  40 mg Oral Daily Jessy Oto, MD   40 mg at 03/17/21 0850   polyethylene glycol (MIRALAX / GLYCOLAX) packet 17 g  17 g Oral Daily PRN Jessy Oto, MD       rosuvastatin (CRESTOR) tablet 10 mg  10 mg Oral QHS Jessy Oto, MD   10 mg at 03/16/21 2209   sodium phosphate (FLEET) 7-19 GM/118ML enema  1 enema  1 enema Rectal Once PRN Jessy Oto, MD       sucralfate (CARAFATE) tablet  1 g  1 g Oral TID WC & HS Jessy Oto, MD   1 g at 03/17/21 1156     Discharge Medications: Please see discharge summary for a list of discharge medications.  Relevant Imaging Results:  Relevant Lab Results:   Additional Information SSN:  [id number], COVID x2  Rebeca Valdivia F Dottie Vaquerano, LCSWA

## 2021-03-17 NOTE — Consult Note (Signed)
Medical Consultation   Stephanie Moreno  [id number]  DOB: July 23, 1945  DOA: 03/15/2021  PCP: Dr. Anastasio Champion  Outpatient Specialists: Dr. Theador Hawthorne: nephrology,  neurology: Dr. Jaynee Eagles, endocrinology: Rayetta Pigg, NP   Requesting physician: Dr. Louanne Skye   Reason for consultation: Multiple co-morbidities: T2DM, HTN with some hypotension, CKD s/p fusion of lumbar spine on 03/15/21  History of Present Illness: Stephanie Moreno is an 75 y.o. 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 We were asked to consult for managing medical co morbidities and new complaints of wheezing, dysphagia.   She states she started to wheeze this morning around breakfast and she was placed on 2L of oxygen. She can't really tell a difference with the oxygen on. She feels like it is difficult to take a deep breath of air in. She also has tightness in her chest. She states she has a history of bronchitis and uses an as needed albuterol inhaler. Feels similar, but it's not going away. She denies any swelling in her legs, weight gain or cough.   She also feels like since her surgery it's harder to swallow and food gets stuck in her throat and wont' go down. She has had some coughing after swallowing.   She feels like her mind is not connecting with her body either. She knows what she wants to do in her head, but her body wont do it. Her speech is not normal either. Not slurred, but slow. She has no aphasia or dysarthria. She denies any facial drooping or unilateral weakness. She is very tired and states she has not slept well the past 2 nights.   PT is in room and states today is a pretty big difference in her mentation and physical activity from yesterday.  She is needing much more assist and direction. Also  states she got a piece of food stuck in her throat while they were in room.   Review of Systems:   Review of Systems  Constitutional:  Positive for fatigue. Negative for chills and fever.  Eyes:  Negative for photophobia and visual disturbance.  Respiratory:  Positive for chest tightness and wheezing. Negative for cough and shortness of breath.   Cardiovascular:  Negative for chest pain, palpitations and leg swelling.  Gastrointestinal:  Positive for abdominal distention and abdominal pain. Negative for diarrhea, nausea and vomiting.  Genitourinary:  Negative for dysuria, frequency and urgency.  Neurological:  Positive for speech difficulty. Negative for dizziness, facial asymmetry, weakness, light-headedness and numbness.  Psychiatric/Behavioral:  Negative for agitation, confusion and hallucinations.    Past Medical History: Past Medical History:  Diagnosis Date   Acid reflux disease    Anemia    Anxiety    Asthma    Breast cancer ([place])    left side   Chronic back pain    Chronic kidney disease    Diabetes mellitus without complication ([place])    Diverticulitis    Hypertension    Lumbosacral spondylosis    Per previous PCP records   Sleep apnea    Thyroid disease     Past Surgical History: Past Surgical History:  Procedure Laterality Date   ABDOMINAL HYSTERECTOMY     BIOPSY  02/19/2020   Procedure: BIOPSY;  Surgeon: Hurshel Keys  K, DO;  Location: [facility];  Service: Endoscopy;;   BREAST LUMPECTOMY Left 2003   BREAST SURGERY     COLONOSCOPY WITH PROPOFOL N/A 02/19/2020   non-bleeding internal hemorrhoids, many small-mouthed diverticula in entire colon.   ESOPHAGOGASTRODUODENOSCOPY (EGD) WITH PROPOFOL N/A 02/19/2020    benign-appearing esophageal stenosis s/p dilation, gastritis s/p biopsy, normal duodenum. Negative H.pylori, negative duodenal biopsy.    GIVENS CAPSULE STUDY N/A 06/14/2020   Procedure: GIVENS CAPSULE STUDY;  Surgeon: Eloise Harman, DO;   Location: [facility];  Service: Endoscopy;  Laterality: N/A;  7:30am   THYROIDECTOMY     tummy tuck     at time of breast surgery 2003     Allergies:   Allergies  Allergen Reactions   Codeine Nausea And Vomiting   Hydrochlorothiazide Other (See Comments)   Lisinopril Nausea And Vomiting   Oxybutynin Other (See Comments)   Penicillins Hives     Social History:  reports that she has never smoked. She has never used smokeless tobacco. She reports that she does not currently use alcohol. She reports that she does not currently use drugs.   Family History: reviewed below.  Family History  Problem Relation Age of Onset   Diabetes Mother    Thyroid disease Mother    Colon cancer Neg Hx      Physical Exam: Vitals:   03/17/21 0811 03/17/21 0815 03/17/21 0936 03/17/21 1123  BP: (!) 110/59  124/66 (!) 111/55  Pulse: 91  98 95  Resp: 17   14  Temp: 99 F (37.2 C)  99.2 F (37.3 C) 98.7 F (37.1 C)  TempSrc: Oral  Oral Oral  SpO2: 100% 92% 96% 99%    Constitutional: resting in bed.  Alert and awake, oriented x3, not in any acute distress. Eyes: PERLA, EOMI, irises appear normal, anicteric sclera,  ENMT: external ears and nose appear normal, normal hearing             Lips appears normal, slightly dry mucosa on lips,  oropharynx mucosa, tongue, posterior pharynx appear normal  Neck: neck appears normal, no masses, normal ROM, no thyromegaly, no JVD  CVS: S1-S2 clear, quiet systolic murmur, no rubs or gallops, no LE edema, normal pedal pulses  Respiratory:  clear to auscultation bilaterally, no wheezing, rales or rhonchi. Limited exam, as she can not sit up off bed. Respiratory effort normal. No accessory muscle use.  Abdomen: soft, distended. TTP in RLQ, suprapubic area and umbilical area.  normal bowel sounds, no hepatosplenomegaly, no hernias  Musculoskeletal: : no cyanosis, clubbing or edema noted bilaterally. UE strength equal and intact. Could not test lower extremity  with back surgery.                         Neuro: Cranial nerves II-XII intact, although needing a lot of cues for following finger with eyes. Speech is slow with ever faint slur. No aphasia or dysarthria.  strength, sensation, reflexes. No pronator drift. Gait deferred.  Psych: judgement and insight appear normal, stable mood and affect, mental status Skin: no rashes or lesions or ulcers, no induration or nodules    Data reviewed:  I have personally reviewed following labs and imaging studies Labs:  CBC: Recent Labs  Lab 03/11/21 1400 03/16/21 0102 03/17/21 0155  WBC 7.0 11.3* 12.1*  NEUTROABS  --   --  9.0*  HGB 13.3 9.9* 10.0*  HCT 41.9 30.3* 30.2*  MCV 88.2 87.1 87.0  PLT 272 196 [id number]    Basic Metabolic Panel: Recent Labs  Lab 03/11/21 1400 03/16/21 0102 03/17/21 0155  NA 135 132* 136  K 3.9 4.0 3.9  CL 106 103 108  CO2 19* 18* 20*  GLUCOSE 139* 397* 122*  BUN '18 20 18  '$ CREATININE 1.34* 1.42* 1.39*  CALCIUM 9.3 8.0* 7.8*   GFR Estimated Creatinine Clearance: 35.6 mL/min (A) (by C-G formula based on SCr of 1.39 mg/dL (H)). Liver Function Tests: Recent Labs  Lab 03/11/21 1400  AST 37  ALT 26  ALKPHOS 75  BILITOT 0.6  PROT 8.2*  ALBUMIN 3.7   No results for input(s): LIPASE, AMYLASE in the last 168 hours. No results for input(s): AMMONIA in the last 168 hours. Coagulation profile Recent Labs  Lab 03/11/21 1400  INR 1.0    Cardiac Enzymes: No results for input(s): CKTOTAL, CKMB, CKMBINDEX, TROPONINI in the last 168 hours. BNP: Invalid input(s): POCBNP CBG: Recent Labs  Lab 03/16/21 1200 03/16/21 1626 03/16/21 2106 03/17/21 0644 03/17/21 1125  GLUCAP 282* 192* 108* 101* 197*   D-Dimer No results for input(s): DDIMER in the last 72 hours. Hgb A1c Recent Labs    03/15/21 0558  HGBA1C 7.5*   Lipid Profile No results for input(s): CHOL, HDL, LDLCALC, TRIG, CHOLHDL, LDLDIRECT in the last 72 hours. Thyroid function studies No results for  input(s): TSH, T4TOTAL, T3FREE, THYROIDAB in the last 72 hours.  Invalid input(s): FREET3 Anemia work up No results for input(s): VITAMINB12, FOLATE, FERRITIN, TIBC, IRON, RETICCTPCT in the last 72 hours. Urinalysis    Component Value Date/Time   COLORURINE YELLOW 03/11/2021 1400   APPEARANCEUR CLEAR 03/11/2021 1400   LABSPEC <1.005 (L) 03/11/2021 1400   PHURINE 6.0 03/11/2021 1400   GLUCOSEU NEGATIVE 03/11/2021 1400   HGBUR TRACE (A) 03/11/2021 1400   BILIRUBINUR NEGATIVE 03/11/2021 1400   BILIRUBINUR negative 09/17/2020 1631   KETONESUR NEGATIVE 03/11/2021 1400   PROTEINUR NEGATIVE 03/11/2021 1400   UROBILINOGEN 0.2 09/17/2020 1631   UROBILINOGEN 0.2 01/01/2007 1207   NITRITE NEGATIVE 03/11/2021 1400   LEUKOCYTESUR LARGE (A) 03/11/2021 1400     Sepsis Labs Invalid input(s): PROCALCITONIN,  WBC,  LACTICIDVEN Microbiology Recent Results (from the past 240 hour(s))  Surgical pcr screen     Status: None   Collection Time: 03/11/21  2:00 PM   Specimen: Nasal Mucosa; Nasal Swab  Result Value Ref Range Status   MRSA, PCR NEGATIVE NEGATIVE Final   Staphylococcus aureus NEGATIVE NEGATIVE Final    Comment: (NOTE) The Xpert SA Assay (FDA approved for NASAL specimens in patients 12 years of age and older), is one component of a comprehensive surveillance program. It is not intended to diagnose infection nor to guide or monitor treatment. Performed at [facility], [place] [address] [place], [place] [address]   SARS CORONAVIRUS 2 (TAT 6-24 HRS) Nasopharyngeal Nasopharyngeal Swab     Status: None   Collection Time: 03/11/21  2:13 PM   Specimen: Nasopharyngeal Swab  Result Value Ref Range Status   SARS Coronavirus 2 NEGATIVE NEGATIVE Final    Comment: (NOTE) SARS-CoV-2 target nucleic acids are NOT DETECTED.  The SARS-CoV-2 RNA is generally detectable in upper and lower respiratory specimens during the acute phase of infection. Negative results do not preclude SARS-CoV-2  infection, do not rule out co-infections with other pathogens, and should not be used as the sole basis for treatment or other patient management decisions. Negative results must be combined with clinical observations, patient history,  and epidemiological information. The expected result is Negative.  Fact Sheet for Patients: SugarRoll.be  Fact Sheet for Healthcare Providers: https://www.woods-mathews.com/  This test is not yet approved or cleared by the [place] FDA and  has been authorized for detection and/or diagnosis of SARS-CoV-2 by FDA under an Emergency Use Authorization (EUA). This EUA will remain  in effect (meaning this test can be used) for the duration of the COVID-19 declaration under Se ction 564(b)(1) of the Act, 21 U.S.C. section 360bbb-3(b)(1), unless the authorization is terminated or revoked sooner.  Performed at [facility], [place] [address], [place], [place] [address]        Inpatient Medications:   Scheduled Meds:  amitriptyline  25 mg Oral QHS   amLODipine  5 mg Oral Daily   budesonide (PULMICORT) nebulizer solution  0.25 mg Nebulization BID   Chlorhexidine Gluconate Cloth  6 each Topical Daily   docusate sodium  100 mg Oral BID   furosemide  40 mg Oral Daily   gabapentin  300 mg Oral TID   HYDROcodone-acetaminophen  1 tablet Oral Q6H   insulin aspart  0-15 Units Subcutaneous TID WC   insulin aspart  3 Units Subcutaneous TID WC   insulin aspart  3 Units Subcutaneous QHS   insulin glargine-yfgn  20 Units Subcutaneous QHS   linaclotide  145 mcg Oral QAC breakfast   losartan  100 mg Oral Daily   pantoprazole  40 mg Oral Daily   rosuvastatin  10 mg Oral QHS   sucralfate  1 g Oral TID WC & HS   Continuous Infusions:  methocarbamol (ROBAXIN) IV       Radiological Exams on Admission: No results found.  Impression/Recommendations Principal Problem:   Fusion of spine of lumbar region -POD #2.  Per orthopedics.  -concern for ileus. She is having BM and flatus. Abdomdinal xray has been ordered  Active Problems: Speech difficulty/dysphagia -stat CT of head. Rest of neuro exam reassuring -PT states she choked on some food while in room, did not witness this personally, but continues to complain of food stuck in throat.  -NPO until SLP can eval swallow function. Does have good laryngeal elevation at bedside with me. Could just be from s/p intubation. Hopefully can have her evaluated today so she can eat dinner.  -continue chloraseptic spray   SOB/wheezing Unsure if truly has respiratory failure, as not documented with hypoxia on vitals or per orthopedics.  Stat CXR. No history of CHF with recent echo 12/22/20 with normal EF, indeterminate diastolic fx. No crackles appreciated on exam, but limited exam. No leg swelling or weight gain.  -no calf swelling or tachycardia. Low threshold for CTA if no improvement.  SABA prn-asked nurse to give now.  -on pulmicort neb BID  -IS at bedside -RT to wean as tolerated.   Leukocytosis Mildly elevated and likely reactive as no source of infection at this point.  No fevers, continue to trend fever curve and watch her clinically -CXR ordered    Essential hypertension, benign Has had soft blood pressure. Medication held today.  -wrote  holding parameters for her norvasc/losartan -pain medication and surgery maybe contributing, continue to follow     Iron deficiency anemia At baseline, continue to monitor.    Type 2 diabetes mellitus  Continue long acting insulin and SSI as ordered A1c recently checked and 7.5  stage 3b chronic kidney disease -followed by [facility] -baseline creatinine around 1.42 on last office visit note from 7/22. On  high dose lasix per OV note for leg swelling.  -stable, continue to follow  -continue home lasix   Mixed hyperlipidemia Continue home statin    GERD (gastroesophageal reflux disease) Continue  home medication   Thank you for this consultation.  Our [facility] hospitalist team will follow the patient with you.   Time Spent: 60 minutes   Dragon dictation used in completing this note.    Orma Flaming M.D. [facility] 03/17/2021, 2:08 PM

## 2021-03-17 NOTE — Progress Notes (Signed)
Occupational Therapy Treatment Patient Details Name: Stephanie Moreno MRN: [id number] DOB: 1946-06-26 Today's Date: 03/17/2021   History of present illness Pt is a 75 y/o female admitted 9/13 for low back pain and radiculopathy. S/P L4-5 and L5-S1 laminectomy, TLIF L L 5-S1. Had small CSF leak but okay to mobilize per Ortho.  PMH includes: anemia, anxiety, breast CA, DM, HTN, CKD, chronic back pain.   OT comments  Patient supine in bed and agreeable to OOB for breakfast.  Requires increased assist for bed mobility today (mod assist) with cueing for log roll technique. Sitting EOB with min-mod assist initially to maintain balance with posterior and L lateral lean, pt relying on UE support or external support.  Vitals assessed EOB as pt reports mild lightheadedness, but WFL.  Attempted sit to stand, partial stand with max assist and unable to fully ascend today.  Deferred transfer and assisted pt back to bed so she could eat breakfast.  She requires increased time for processing and problem solving throughout session, difficulty following multiple step commands for transfers and ADLs. Continue to recommend SNF at this time.  Will follow acutely.    Recommendations for follow up therapy are one component of a multi-disciplinary discharge planning process, led by the attending physician.  Recommendations may be updated based on patient status, additional functional criteria and insurance authorization.    Follow Up Recommendations  SNF;Supervision/Assistance - 24 hour    Equipment Recommendations  Other (comment) (TBD)    Recommendations for Other Services      Precautions / Restrictions Precautions Precautions: Back Precaution Booklet Issued: Yes (comment) Precaution Comments: reviewed precautions Required Braces or Orthoses: Spinal Brace Spinal Brace: Lumbar corset;Applied in sitting position Restrictions Weight Bearing Restrictions: No       Mobility Bed Mobility Overal bed mobility:  Needs Assistance Bed Mobility: Rolling;Sidelying to Sit;Sit to Sidelying Rolling: Mod assist Sidelying to sit: Mod assist     Sit to sidelying: Max assist General bed mobility comments: pt requires cueing for log roll technique, assist to fully roll towards R side and sustain sidelying while transitioning to EOB.  Returned to supine with full support for LB and guiding trunk.  Increased assist today.    Transfers                      Balance Overall balance assessment: Needs assistance Sitting-balance support: No upper extremity supported;Feet supported;Bilateral upper extremity supported Sitting balance-Leahy Scale: Poor Sitting balance - Comments: pt relies on external support initally with BUE support, heavy posterior and L lateral lean.  Improved with sustained sitting but contineus to rely on UE support today.       Standing balance comment: unable to fully power up into standing, requires external and BUE support                           ADL either performed or assessed with clinical judgement   ADL Overall ADL's : Needs assistance/impaired     Grooming: Minimal assistance;Sitting Grooming Details (indicate cue type and reason): requires min assist to maintain sitting EOB balance with 1-2 hand support             Lower Body Dressing: Total assistance;Sitting/lateral leans;Sit to/from stand Lower Body Dressing Details (indicate cue type and reason): for sock mgmt, balance and physical support   Toilet Transfer Details (indicate cue type and reason): unable, attempt sit to stand multiple times from EOB and unable  to fully power up--deferred for safety         Functional mobility during ADLs: Moderate assistance;Maximal assistance;Cueing for sequencing;Cueing for safety General ADL Comments: pt with decreased sitting balance today, difficulty follownig commands and overall generalized weakness/pain     Vision       Perception     Praxis       Cognition Arousal/Alertness: Awake/alert Behavior During Therapy: Flat affect Overall Cognitive Status: Impaired/Different from baseline Area of Impairment: Problem solving;Memory;Following commands;Awareness                     Memory: Decreased recall of precautions Following Commands: Follows one step commands consistently;Follows one step commands with increased time;Follows multi-step commands inconsistently   Awareness: Emergent Problem Solving: Slow processing;Requires verbal cues;Decreased initiation;Difficulty sequencing General Comments: pt able to recall precautions but requires cueing to adhere, slow processing and decreased problem solving requiring increased cueing and single step commands        Exercises     Shoulder Instructions       General Comments      Pertinent Vitals/ Pain       Pain Assessment: 0-10 Pain Score: 6  Pain Location: back, L hip Pain Descriptors / Indicators: Aching;Discomfort;Radiating;Burning;Operative site guarding Pain Intervention(s): Limited activity within patient's tolerance;Monitored during session;Repositioned  Home Living                                          Prior Functioning/Environment              Frequency  Min 2X/week        Progress Toward Goals  OT Goals(current goals can now be found in the care plan section)  Progress towards OT goals: Not progressing toward goals - comment (decreased balance and increased physical assist today)  Acute Rehab OT Goals Patient Stated Goal: to get better OT Goal Formulation: With patient  Plan Discharge plan remains appropriate;Frequency remains appropriate    Co-evaluation                 AM-PAC OT "6 Clicks" Daily Activity     Outcome Measure   Help from another person eating meals?: None Help from another person taking care of personal grooming?: A Little Help from another person toileting, which includes using toliet,  bedpan, or urinal?: Total Help from another person bathing (including washing, rinsing, drying)?: A Lot Help from another person to put on and taking off regular upper body clothing?: A Lot Help from another person to put on and taking off regular lower body clothing?: Total 6 Click Score: 13    End of Session Equipment Utilized During Treatment: Rolling walker;Gait belt;Back brace  OT Visit Diagnosis: Other abnormalities of gait and mobility (R26.89);Muscle weakness (generalized) (M62.81);Pain Pain - part of body:  (back, L hip)   Activity Tolerance Patient limited by pain;Patient limited by fatigue   Patient Left in bed;with call bell/phone within reach;with bed alarm set   Nurse Communication Mobility status;Precautions;Other (comment) (balance, increased physical assist)        Time: [id number] OT Time Calculation (min): 35 min  Charges: OT General Charges $OT Visit: 1 Visit OT Treatments $Self Care/Home Management : 23-37 mins  [place] Pager [phone] Office [phone]   Delight Stare 03/17/2021, 10:59 AM

## 2021-03-18 ENCOUNTER — Other Ambulatory Visit (HOSPITAL_COMMUNITY): Payer: Medicare Other

## 2021-03-18 ENCOUNTER — Inpatient Hospital Stay (HOSPITAL_COMMUNITY): Payer: Medicare Other

## 2021-03-18 DIAGNOSIS — R1312 Dysphagia, oropharyngeal phase: Secondary | ICD-10-CM

## 2021-03-18 DIAGNOSIS — K567 Ileus, unspecified: Secondary | ICD-10-CM

## 2021-03-18 DIAGNOSIS — N1832 Chronic kidney disease, stage 3b: Secondary | ICD-10-CM

## 2021-03-18 DIAGNOSIS — R479 Unspecified speech disturbances: Secondary | ICD-10-CM

## 2021-03-18 DIAGNOSIS — R062 Wheezing: Secondary | ICD-10-CM

## 2021-03-18 DIAGNOSIS — R1310 Dysphagia, unspecified: Secondary | ICD-10-CM

## 2021-03-18 DIAGNOSIS — E1122 Type 2 diabetes mellitus with diabetic chronic kidney disease: Secondary | ICD-10-CM

## 2021-03-18 LAB — GLUCOSE, CAPILLARY
Glucose-Capillary: 100 mg/dL — ABNORMAL HIGH (ref 70–99)
Glucose-Capillary: 109 mg/dL — ABNORMAL HIGH (ref 70–99)
Glucose-Capillary: 167 mg/dL — ABNORMAL HIGH (ref 70–99)
Glucose-Capillary: 249 mg/dL — ABNORMAL HIGH (ref 70–99)
Glucose-Capillary: 195 mg/dL — ABNORMAL HIGH (ref 70–99)

## 2021-03-18 MED ORDER — GABAPENTIN 300 MG PO CAPS
300.0000 mg | ORAL_CAPSULE | Freq: Two times a day (BID) | ORAL | Status: DC
  Administered 2021-03-18: 300 mg via ORAL
  Filled 2021-03-18: qty 1

## 2021-03-18 NOTE — Assessment & Plan Note (Signed)
--  per orthopedics

## 2021-03-18 NOTE — Progress Notes (Signed)
   03/18/21 0853  Treat  Pain Score 4

## 2021-03-18 NOTE — Discharge Summary (Signed)
Physician Discharge Summary      Patient ID: Stephanie Moreno MRN: [id number] DOB/AGE: [birth year] 75 y.o.  Admit date: 03/15/2021 Discharge date: 03/19/2021  Admission Diagnoses:  Principal Problem:   Fusion of spine of lumbar region Active Problems:   Iron deficiency anemia   Type 2 diabetes mellitus with stage 3b chronic kidney disease, without long-term current use of insulin (HCC)   Mixed hyperlipidemia   Essential hypertension, benign   GERD (gastroesophageal reflux disease)   Dysphagia   Speech disturbance   Wheezing   Ileus ([place])   Discharge Diagnoses:  Same  Past Medical History:  Diagnosis Date   Acid reflux disease    Anemia    Anxiety    Asthma    Breast cancer ([place])    left side   Chronic back pain    Chronic kidney disease    Diabetes mellitus without complication ([place])    Diverticulitis    Hypertension    Lumbosacral spondylosis    Per previous PCP records   Sleep apnea    Thyroid disease     Surgeries: Procedure(s): CENTRAL LAMINECTOMY LUMBAR FOUR-FIVE  AND LUMBAR FIVE-SACRAL ONE,TRANSFORAMINAL LUMBAR INTERBODY FUSION LEFT LUMBAR FIVE-SACRAL ONE WITH GLOBUS PEDICLE SCREWS, RODS AND SABLE CAGE, LOCAL BONE GRAFT, ALLOGRAFT BONE GRAFT on 03/15/2021   Consultants:   Discharged Condition: Improved  Hospital Course: CARNESHIA RAKER is an 75 y.o. female who was admitted 03/15/2021 with a chief complaint of No chief complaint on file. 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 They were brought to the operating room on 03/15/2021 and underwent the above named procedures.    They were given perioperative antibiotics clindomycin 900 mg IV. Anti-infectives (From admission, onward)    Start     Dose/Rate Route Frequency Ordered Stop   03/15/21 1645  clindamycin (CLEOCIN) IVPB 600 mg        600 mg 100 mL/hr over 30 Minutes Intravenous Every 8 hours 03/15/21 1545 03/15/21 2154   03/15/21 1630  ceFAZolin  (ANCEF) IVPB 2g/100 mL premix  Status:  Discontinued        2 g 200 mL/hr over 30 Minutes Intravenous Every 8 hours 03/15/21 1532 03/15/21 1545   03/15/21 0600  clindamycin (CLEOCIN) IVPB 900 mg        900 mg 100 mL/hr over 30 Minutes Intravenous On call to O.R. 03/15/21 0556 03/15/21 0855     Post op she recovered in the PACU uneventfully and was transferred to med-surgery floor [facility]. POD#! Awake alert and oriented x 4 tolerating po narcotics and nourishment. PT and OT were initiated after a short period of bedrest due to a tiny CSF leak repaired with a single suture and duraseal. POD#2 she had wheezing and desaturation with PT. She abdomen was distended. CXR showed atelectasis bilateral lung bases. KUB showed a fair amount of gas in the bowel throughout suggesting ileus and large amount of stool in the rectum. She was given mirilax with good results. Hgb 10 and electrolyes were normal . Her blood pressure decreased and her antihypertensive agent cozarrr and lasix was discontined due to ilieus and third spacing of fluid. Her oral antihypertensive agents also were held. An internal medicine consult was obtained due to chronic renal insuffiency, diabetes and  Pulmonary decompensation. She was able to move her bowels and  Abdomenal distension improved. Reglan was also given.  She complained of some confusion and her gabapentin that had been  increased post operativeley was return to her usual home regimen. POD#3 she was better, IVF was continued to combat hypovolemia due to ilieus. Her oxygen saturation improved with pulmonary toilet and coughing and deep breathing. An  MRI of the brain was done due to confusion and complaints  of disphagia. The brain MRI was  Without any new findings. SNF was undertaken due to her slow progress in PT. She had express a desire to go to daughter's home and then to a SNF from there and her level of independence dictated Need for SNF prior to either home with daugther  for return to her own home. POD#3 she was stable and above work up was negative, internal medicine determined that use of gabapentin TID and narcotics likely brought about mild confusion. POD#4 she was awake, alert and oriented x 4. Dressing changed noted to have some mild swelling about the incision likely due to seroma or hematoma, no active drainage the incision dry  and staples intact. COVID screen was performed as patient placement in SNF [facility] was able to be done on Saturday. She is stable for discharge her BS are stable with current regimen but she will restart her oral Antihyperglycemia meds. Written Rx for narcotics and gabapentin. Her hypertension returned with normal volume and IVF were discontinued on POD#3. Norvasc and cozarr were restarted. She was stable from medical and orthopaedic standpoint and was discharged to SNF on 03/19/2021. She will be seen back in my office in 10-14 days. She was given sequential compression devices and early ambulation, or DVT prophylaxis.  They benefited maximally from their hospital stay and there were no complications.    Recent vital signs:  Vitals:   03/19/21 0735 03/19/21 0919  BP: (!) 147/65   Pulse: 96 (!) 102  Resp: 19 18  Temp: 98.5 F (36.9 C)   SpO2: 99% 95%    Recent laboratory studies:  Results for orders placed or performed during the hospital encounter of 03/15/21  Hemoglobin A1c  Result Value Ref Range   Hgb A1c MFr Bld 7.5 (H) 4.8 - 5.6 %   Mean Plasma Glucose 168.55 mg/dL  Glucose, capillary  Result Value Ref Range   Glucose-Capillary 92 70 - 99 mg/dL   Comment 1 Notify RN   Glucose, capillary  Result Value Ref Range   Glucose-Capillary 180 (H) 70 - 99 mg/dL   Comment 1 Notify RN    Comment 2 Document in Chart   Glucose, capillary  Result Value Ref Range   Glucose-Capillary 216 (H) 70 - 99 mg/dL  CBC  Result Value Ref Range   WBC 11.3 (H) 4.0 - 10.5 K/uL   RBC 3.48 (L) 3.87 - 5.11 MIL/uL   Hemoglobin 9.9 (L)  12.0 - 15.0 g/dL   HCT 30.3 (L) 36.0 - 46.0 %   MCV 87.1 80.0 - 100.0 fL   MCH 28.4 26.0 - 34.0 pg   MCHC 32.7 30.0 - 36.0 g/dL   RDW 13.7 11.5 - 15.5 %   Platelets 196 150 - 400 K/uL   nRBC 0.0 0.0 - 0.2 %  Basic Metabolic Panel  Result Value Ref Range   Sodium 132 (L) 135 - 145 mmol/L   Potassium 4.0 3.5 - 5.1 mmol/L   Chloride 103 98 - 111 mmol/L   CO2 18 (L) 22 - 32 mmol/L   Glucose, Bld 397 (H) 70 - 99 mg/dL   BUN 20 8 - 23 mg/dL   Creatinine, Ser 1.42 (H) 0.44 -  1.00 mg/dL   Calcium 8.0 (L) 8.9 - 10.3 mg/dL   GFR, Estimated 39 (L) >60 mL/min   Anion gap 11 5 - 15  Glucose, capillary  Result Value Ref Range   Glucose-Capillary 304 (H) 70 - 99 mg/dL  Glucose, capillary  Result Value Ref Range   Glucose-Capillary 282 (H) 70 - 99 mg/dL  Glucose, capillary  Result Value Ref Range   Glucose-Capillary 192 (H) 70 - 99 mg/dL  CBC with Differential/Platelet  Result Value Ref Range   WBC 12.1 (H) 4.0 - 10.5 K/uL   RBC 3.47 (L) 3.87 - 5.11 MIL/uL   Hemoglobin 10.0 (L) 12.0 - 15.0 g/dL   HCT 30.2 (L) 36.0 - 46.0 %   MCV 87.0 80.0 - 100.0 fL   MCH 28.8 26.0 - 34.0 pg   MCHC 33.1 30.0 - 36.0 g/dL   RDW 14.0 11.5 - 15.5 %   Platelets 186 150 - 400 K/uL   nRBC 0.0 0.0 - 0.2 %   Neutrophils Relative % 74 %   Neutro Abs 9.0 (H) 1.7 - 7.7 K/uL   Lymphocytes Relative 18 %   Lymphs Abs 2.2 0.7 - 4.0 K/uL   Monocytes Relative 6 %   Monocytes Absolute 0.7 0.1 - 1.0 K/uL   Eosinophils Relative 1 %   Eosinophils Absolute 0.1 0.0 - 0.5 K/uL   Basophils Relative 0 %   Basophils Absolute 0.0 0.0 - 0.1 K/uL   Immature Granulocytes 1 %   Abs Immature Granulocytes 0.08 (H) 0.00 - 0.07 K/uL  Basic metabolic panel  Result Value Ref Range   Sodium 136 135 - 145 mmol/L   Potassium 3.9 3.5 - 5.1 mmol/L   Chloride 108 98 - 111 mmol/L   CO2 20 (L) 22 - 32 mmol/L   Glucose, Bld 122 (H) 70 - 99 mg/dL   BUN 18 8 - 23 mg/dL   Creatinine, Ser 1.39 (H) 0.44 - 1.00 mg/dL   Calcium 7.8 (L) 8.9 -  10.3 mg/dL   GFR, Estimated 40 (L) >60 mL/min   Anion gap 8 5 - 15  Glucose, capillary  Result Value Ref Range   Glucose-Capillary 108 (H) 70 - 99 mg/dL  Glucose, capillary  Result Value Ref Range   Glucose-Capillary 101 (H) 70 - 99 mg/dL  Glucose, capillary  Result Value Ref Range   Glucose-Capillary 197 (H) 70 - 99 mg/dL  Brain natriuretic peptide  Result Value Ref Range   B Natriuretic Peptide 54.0 0.0 - 100.0 pg/mL  Glucose, capillary  Result Value Ref Range   Glucose-Capillary 153 (H) 70 - 99 mg/dL  Glucose, capillary  Result Value Ref Range   Glucose-Capillary 106 (H) 70 - 99 mg/dL  Glucose, capillary  Result Value Ref Range   Glucose-Capillary 100 (H) 70 - 99 mg/dL  Glucose, capillary  Result Value Ref Range   Glucose-Capillary 109 (H) 70 - 99 mg/dL  Glucose, capillary  Result Value Ref Range   Glucose-Capillary 167 (H) 70 - 99 mg/dL   Comment 1 Notify RN   Glucose, capillary  Result Value Ref Range   Glucose-Capillary 249 (H) 70 - 99 mg/dL  Glucose, capillary  Result Value Ref Range   Glucose-Capillary 195 (H) 70 - 99 mg/dL  Glucose, capillary  Result Value Ref Range   Glucose-Capillary 135 (H) 70 - 99 mg/dL  Glucose, capillary  Result Value Ref Range   Glucose-Capillary 259 (H) 70 - 99 mg/dL    Discharge Medications:   Allergies as  of 03/19/2021       Reactions   Codeine Nausea And Vomiting   Hydrochlorothiazide Other (See Comments)   Lisinopril Nausea And Vomiting   Oxybutynin Other (See Comments)   Penicillins Hives        Medication List     STOP taking these medications    acetaminophen 500 MG tablet Commonly known as: TYLENOL   doxycycline 100 MG tablet Commonly known as: VIBRA-TABS   traMADol 50 MG tablet Commonly known as: ULTRAM       TAKE these medications    Accu-Chek Guide Me w/Device Kit 1 Piece by Does not apply route as directed.   amitriptyline 25 MG tablet Commonly known as: ELAVIL Take 25 mg by mouth at  bedtime.   amLODipine 5 MG tablet Commonly known as: NORVASC Take 5 mg by mouth daily.   aspirin EC 81 MG tablet Take 81 mg by mouth daily. Swallow whole.   B-D ULTRAFINE III SHORT PEN 31G X 8 MM Misc Generic drug: Insulin Pen Needle Use as directed to inject insulin daily What changed:  how much to take how to take this when to take this additional instructions   beclomethasone 80 MCG/ACT inhaler Commonly known as: QVAR Inhale 1 puff into the lungs 2 (two) times daily as needed (shortness of breath).   calcium-vitamin D 500-200 MG-UNIT Tabs tablet Commonly known as: OSCAL WITH D Take 1 tablet by mouth in the morning, at noon, and at bedtime.   CLEAR EYES OP Place 1 drop into both eyes daily as needed (dry eyes).   cyclobenzaprine 10 MG tablet Commonly known as: FLEXERIL Take 10 mg by mouth 3 (three) times daily as needed for muscle spasms.   docusate sodium 100 MG capsule Commonly known as: COLACE TAKE 1 CAPSULE BY MOUTH TWICE A DAY   fluticasone 50 MCG/ACT nasal spray Commonly known as: FLONASE Place 2 sprays into both nostrils daily as needed for allergies or rhinitis.   furosemide 40 MG tablet Commonly known as: LASIX Take 40-80 mg by mouth See admin instructions. Takes 40 mg every other day. On alternate days take 80 mg   gabapentin 300 MG capsule Commonly known as: NEURONTIN Take 1 capsule (300 mg total) by mouth at bedtime.   Gemtesa 75 MG Tabs Generic drug: Vibegron Take 75 mg by mouth daily.   glipiZIDE 5 MG 24 hr tablet Commonly known as: GLUCOTROL XL TAKE 1 TABLET BY MOUTH EVERY DAY WITH BREAKFAST   GLUCOSAMINE-CHONDROITIN PO Take 1 tablet by mouth daily.   glucose blood test strip 1 each by Other route 3 (three) times daily. Dx: E11.65   HYDROcodone-acetaminophen 10-325 MG tablet Commonly known as: NORCO Take 1 tablet by mouth every 4 (four) hours as needed for moderate pain ((score 4 to 6)).   lidocaine 5 % Commonly known as:  LIDODERM Place 2 patches onto the skin daily as needed (pain). Remove & Discard patch within 12 hours or as directed by MD. Applied to lower back/shoulders/neck for pain   linaclotide 145 MCG Caps capsule Commonly known as: Linzess Take 1 capsule (145 mcg total) by mouth daily before breakfast.   losartan 100 MG tablet Commonly known as: COZAAR Take 100 mg by mouth daily.   magnesium oxide 400 MG tablet Commonly known as: MAG-OX Take 400 mg by mouth daily.   meclizine 25 MG tablet Commonly known as: ANTIVERT Take 25 mg by mouth 3 (three) times daily as needed for dizziness.   methocarbamol 500 MG tablet  Commonly known as: ROBAXIN Take 1 tablet (500 mg total) by mouth every 6 (six) hours as needed for muscle spasms.   metoprolol tartrate 50 MG tablet Commonly known as: LOPRESSOR Take 50 mg by mouth 2 (two) times daily.   OneTouch Delica Plus [id number] Misc Use to check glucose twice daily   pantoprazole 40 MG tablet Commonly known as: PROTONIX Take 1 tablet (40 mg total) by mouth daily. 30 minutes before breakfast   phenazopyridine 200 MG tablet Commonly known as: PYRIDIUM Take 1 tablet (200 mg total) by mouth 3 (three) times daily.   polyethylene glycol 17 g packet Commonly known as: MIRALAX / GLYCOLAX Take 17 g by mouth daily as needed for moderate constipation. What changed: Another medication with the same name was added. Make sure you understand how and when to take each.   polyethylene glycol 17 g packet Commonly known as: MIRALAX / GLYCOLAX Take 17 g by mouth daily. Start taking on: March 20, 2021 What changed: You were already taking a medication with the same name, and this prescription was added. Make sure you understand how and when to take each.   rosuvastatin 10 MG tablet Commonly known as: CRESTOR Take 10 mg by mouth at bedtime.   sucralfate 1 g tablet Commonly known as: CARAFATE Take 1 g by mouth in the morning, at noon, and at bedtime.    Tyler Aas FlexTouch 100 UNIT/ML FlexTouch Pen Generic drug: insulin degludec Inject 40 Units into the skin daily. What changed: when to take this   Trulicity 3 NF/6.2ZH Sopn Generic drug: Dulaglutide Inject 3 mg as directed once a week.   verapamil 180 MG CR tablet Commonly known as: CALAN-SR Take 180 mg by mouth daily.               Durable Medical Equipment  (From admission, onward)           Start     Ordered   03/15/21 1533  DME Walker rolling  Once       Question:  Patient needs a walker to treat with the following condition  Answer:  Spinal stenosis of lumbosacral region   03/15/21 1532   03/15/21 1533  DME 3 n 1  Once        03/15/21 1532            Diagnostic Studies: DG Chest 2 View  Result Date: 03/14/2021 CLINICAL DATA:  Preoperative testing EXAM: CHEST - 2 VIEW COMPARISON:  None. FINDINGS: Cardiac shadow is within normal limits. Aortic calcifications are noted. The lungs are well aerated bilaterally without focal infiltrate or effusion. Degenerative change of the thoracic spine is noted. IMPRESSION: No active cardiopulmonary disease. Electronically Signed   By: Inez Catalina M.D.   On: 03/14/2021 09:09   DG Lumbar Spine Complete  Result Date: 03/15/2021 CLINICAL DATA:  Lumbar fusion surgery EXAM: LUMBAR SPINE - COMPLETE 4+ VIEW; DG C-ARM 1-60 MIN-NO REPORT COMPARISON:  11/11/2020 FINDINGS: Fluoroscopic spot images document placement of bilateral pedicle screws L5 and S1 with vertical interconnecting rods, and graft cage in the L5-S1 interspace. IMPRESSION: Interval instrumented fusion L5-S1. Electronically Signed   By: Lucrezia Europe M.D.   On: 03/15/2021 15:04   DG Abd 1 View  Result Date: 03/17/2021 CLINICAL DATA:  Constipation, wheezing. EXAM: ABDOMEN - 1 VIEW COMPARISON:  Prior abdominal imaging from 2021. No recent comparison. FINDINGS: Moderate volume of stool in the rectum. Scattered gas-filled loops of large and small bowel throughout the abdomen,  mild-to-moderately  distended. Postoperative changes related to L5-S1 fusion with skin staples projecting over the spine related to spinal fusion. IMPRESSION: Moderate volume of stool in the rectum. Scattered gas-filled loops of large and small bowel throughout the abdomen, mild-to-moderately distended. Findings may reflect postoperative ileus, correlate with any signs of fecal impaction given the large stool ball in the area of the rectum. Electronically Signed   By: Zetta Bills M.D.   On: 03/17/2021 16:09   CT HEAD WO CONTRAST (5MM)  Result Date: 03/17/2021 CLINICAL DATA:  Neuro deficit, acute, stroke suspected EXAM: CT HEAD WITHOUT CONTRAST TECHNIQUE: Contiguous axial images were obtained from the base of the skull through the vertex without intravenous contrast. COMPARISON:  None. FINDINGS: Brain: Chronic small vessel disease throughout the deep white matter. No acute intracranial abnormality. Specifically, no hemorrhage, hydrocephalus, mass lesion, acute infarction, or significant intracranial injury. Vascular: No hyperdense vessel or unexpected calcification. Skull: No acute calvarial abnormality. Sinuses/Orbits: No acute findings Other: None IMPRESSION: Chronic microvascular ischemic changes throughout the deep white matter. No acute intracranial abnormality. Electronically Signed   By: Rolm Baptise M.D.   On: 03/17/2021 15:47   MR ANGIO HEAD WO CONTRAST  Result Date: 03/18/2021 CLINICAL DATA:  Neuro deficit, acute, stroke suspected; Transient ischemic attack (TIA) EXAM: MRI HEAD WITHOUT CONTRAST MRA HEAD WITHOUT CONTRAST MRA NECK WITHOUT CONTRAST TECHNIQUE: Multiplanar, multiecho pulse sequences of the brain and surrounding structures were obtained without intravenous contrast. Angiographic images of the Circle of Willis were obtained using MRA technique without intravenous contrast. Angiographic images of the neck were obtained using MRA technique without intravenous contrast. Carotid stenosis  measurements (when applicable) are obtained utilizing NASCET criteria, using the distal internal carotid diameter as the denominator. COMPARISON:  None. FINDINGS: Motion artifact is present. MRI HEAD Brain: There is no acute infarction or intracranial hemorrhage. There is no intracranial mass, mass effect, or edema. There is no hydrocephalus or extra-axial fluid collection. Some foci of susceptibility are present in the right cerebellum compatible with chronic microhemorrhages. Patchy T2 hyperintensity in the supratentorial and pontine white matter is nonspecific but may reflect mild to moderate chronic microvascular ischemic changes. Vascular: Major vessel flow voids at the skull base are preserved. Skull and upper cervical spine: Normal marrow signal is preserved. Sinuses/Orbits: Paranasal sinuses are aerated. Orbits are unremarkable. Other: Sella is unremarkable.  Mastoid air cells are clear. MRA HEAD Intracranial internal carotid arteries are patent. Middle and anterior cerebral arteries are patent. Intracranial vertebral arteries, basilar artery, posterior cerebral arteries are patent. Left posterior communicating artery is present. MRA NECK Visualized portions of the common, internal, and external carotid arteries are patent. Visualized portions of the codominant extracranial vertebral arteries are patent. No apparent hemodynamically significant stenosis. IMPRESSION: Motion degraded. No acute infarction, hemorrhage, or mass. Mild to moderate chronic microvascular ischemic changes. No large vessel occlusion or significant stenosis. Electronically Signed   By: Macy Mis M.D.   On: 03/18/2021 14:01   MR ANGIO NECK WO CONTRAST  Result Date: 03/18/2021 CLINICAL DATA:  Neuro deficit, acute, stroke suspected; Transient ischemic attack (TIA) EXAM: MRI HEAD WITHOUT CONTRAST MRA HEAD WITHOUT CONTRAST MRA NECK WITHOUT CONTRAST TECHNIQUE: Multiplanar, multiecho pulse sequences of the brain and surrounding  structures were obtained without intravenous contrast. Angiographic images of the Circle of Willis were obtained using MRA technique without intravenous contrast. Angiographic images of the neck were obtained using MRA technique without intravenous contrast. Carotid stenosis measurements (when applicable) are obtained utilizing NASCET criteria, using the distal internal carotid diameter as  the denominator. COMPARISON:  None. FINDINGS: Motion artifact is present. MRI HEAD Brain: There is no acute infarction or intracranial hemorrhage. There is no intracranial mass, mass effect, or edema. There is no hydrocephalus or extra-axial fluid collection. Some foci of susceptibility are present in the right cerebellum compatible with chronic microhemorrhages. Patchy T2 hyperintensity in the supratentorial and pontine white matter is nonspecific but may reflect mild to moderate chronic microvascular ischemic changes. Vascular: Major vessel flow voids at the skull base are preserved. Skull and upper cervical spine: Normal marrow signal is preserved. Sinuses/Orbits: Paranasal sinuses are aerated. Orbits are unremarkable. Other: Sella is unremarkable.  Mastoid air cells are clear. MRA HEAD Intracranial internal carotid arteries are patent. Middle and anterior cerebral arteries are patent. Intracranial vertebral arteries, basilar artery, posterior cerebral arteries are patent. Left posterior communicating artery is present. MRA NECK Visualized portions of the common, internal, and external carotid arteries are patent. Visualized portions of the codominant extracranial vertebral arteries are patent. No apparent hemodynamically significant stenosis. IMPRESSION: Motion degraded. No acute infarction, hemorrhage, or mass. Mild to moderate chronic microvascular ischemic changes. No large vessel occlusion or significant stenosis. Electronically Signed   By: Macy Mis M.D.   On: 03/18/2021 14:01   MR BRAIN WO CONTRAST  Result  Date: 03/18/2021 CLINICAL DATA:  Neuro deficit, acute, stroke suspected; Transient ischemic attack (TIA) EXAM: MRI HEAD WITHOUT CONTRAST MRA HEAD WITHOUT CONTRAST MRA NECK WITHOUT CONTRAST TECHNIQUE: Multiplanar, multiecho pulse sequences of the brain and surrounding structures were obtained without intravenous contrast. Angiographic images of the Circle of Willis were obtained using MRA technique without intravenous contrast. Angiographic images of the neck were obtained using MRA technique without intravenous contrast. Carotid stenosis measurements (when applicable) are obtained utilizing NASCET criteria, using the distal internal carotid diameter as the denominator. COMPARISON:  None. FINDINGS: Motion artifact is present. MRI HEAD Brain: There is no acute infarction or intracranial hemorrhage. There is no intracranial mass, mass effect, or edema. There is no hydrocephalus or extra-axial fluid collection. Some foci of susceptibility are present in the right cerebellum compatible with chronic microhemorrhages. Patchy T2 hyperintensity in the supratentorial and pontine white matter is nonspecific but may reflect mild to moderate chronic microvascular ischemic changes. Vascular: Major vessel flow voids at the skull base are preserved. Skull and upper cervical spine: Normal marrow signal is preserved. Sinuses/Orbits: Paranasal sinuses are aerated. Orbits are unremarkable. Other: Sella is unremarkable.  Mastoid air cells are clear. MRA HEAD Intracranial internal carotid arteries are patent. Middle and anterior cerebral arteries are patent. Intracranial vertebral arteries, basilar artery, posterior cerebral arteries are patent. Left posterior communicating artery is present. MRA NECK Visualized portions of the common, internal, and external carotid arteries are patent. Visualized portions of the codominant extracranial vertebral arteries are patent. No apparent hemodynamically significant stenosis. IMPRESSION: Motion  degraded. No acute infarction, hemorrhage, or mass. Mild to moderate chronic microvascular ischemic changes. No large vessel occlusion or significant stenosis. Electronically Signed   By: Macy Mis M.D.   On: 03/18/2021 14:01   DG CHEST PORT 1 VIEW  Result Date: 03/17/2021 CLINICAL DATA:  Wheezing and shortness of breath. EXAM: PORTABLE CHEST 1 VIEW COMPARISON:  03/11/2021 FINDINGS: The cardiac silhouette, mediastinal and hilar contours are within normal limits and stable given the AP projection and portable technique. Stable tortuosity and calcification of the thoracic aorta. Low lung volumes with vascular crowding and streaky basilar atelectasis but no infiltrates, edema or effusions. The bony thorax is intact. IMPRESSION: Low lung volumes with  vascular crowding and streaky basilar atelectasis. Electronically Signed   By: Marijo Sanes M.D.   On: 03/17/2021 15:53   DG C-Arm 1-60 Min-No Report  Result Date: 03/15/2021 CLINICAL DATA:  Lumbar fusion surgery EXAM: LUMBAR SPINE - COMPLETE 4+ VIEW; DG C-ARM 1-60 MIN-NO REPORT COMPARISON:  11/11/2020 FINDINGS: Fluoroscopic spot images document placement of bilateral pedicle screws L5 and S1 with vertical interconnecting rods, and graft cage in the L5-S1 interspace. IMPRESSION: Interval instrumented fusion L5-S1. Electronically Signed   By: Lucrezia Europe M.D.   On: 03/15/2021 15:04   SLEEP STUDY DOCUMENTS  Result Date: 03/14/2021 Ordered by an unspecified provider.   Disposition: Discharge disposition: 03-Skilled Nursing Facility      Discharge Instructions     Call MD / Call 911   Complete by: As directed    If you experience chest pain or shortness of breath, CALL 911 and be transported to the hospital emergency room.  If you develope a fever above 101 F, pus (white drainage) or increased drainage or redness at the wound, or calf pain, call your surgeon's office.   Constipation Prevention   Complete by: As directed    Drink plenty of fluids.   Prune juice may be helpful.  You may use a stool softener, such as Colace (over the counter) 100 mg twice a day.  Use MiraLax (over the counter) for constipation as needed.   Diet - low sodium heart healthy   Complete by: As directed    Discharge instructions   Complete by: As directed    Call if there is increasing drainage, fever greater than 101.5, severe head aches, and worsening nausea or light sensitivity. If shortness of breath, bloody cough or chest tightness or pain go to an emergency room. No lifting greater than 10 lbs. Avoid bending, stooping and twisting. Use brace when sitting and out of bed even to go to bathroom. Walk in house for first 2 weeks then may start to get out slowly increasing distances up to one mile by 4-6 weeks post op. After 5 days may shower and change dressing following bathing with shower.When bathing remove the brace shower and replace brace before getting out of the shower. If drainage, keep dry dressing and do not bathe the incision, use an moisture impervious dressing. Please call and return for scheduled follow up appointment 2 weeks from the time of surgery.   Driving restrictions   Complete by: As directed    No driving for 4 weeks   Increase activity slowly as tolerated   Complete by: As directed    Lifting restrictions   Complete by: As directed    No lifting for 8 weeks   Post-operative opioid taper instructions:   Complete by: As directed    POST-OPERATIVE OPIOID TAPER INSTRUCTIONS: It is important to wean off of your opioid medication as soon as possible. If you do not need pain medication after your surgery it is ok to stop day one. Opioids include: Codeine, Hydrocodone(Norco, Vicodin), Oxycodone(Percocet, oxycontin) and hydromorphone amongst others.  Long term and even short term use of opiods can cause: Increased pain response Dependence Constipation Depression Respiratory depression And more.  Withdrawal symptoms can include Flu  like symptoms Nausea, vomiting And more Techniques to manage these symptoms Hydrate well Eat regular healthy meals Stay active Use relaxation techniques(deep breathing, meditating, yoga) Do Not substitute Alcohol to help with tapering If you have been on opioids for less than two weeks and do not have pain than  it is ok to stop all together.  Plan to wean off of opioids This plan should start within one week post op of your joint replacement. Maintain the same interval or time between taking each dose and first decrease the dose.  Cut the total daily intake of opioids by one tablet each day Next start to increase the time between doses. The last dose that should be eliminated is the evening dose.           Follow-up Information     Jessy Oto, MD Follow up in 2 week(s).   Specialty: Orthopedic Surgery Why: For wound re-check Contact information: [address] [place] [address] [phone]                  Signed: Basil Dess 03/19/2021, 2:28 PM

## 2021-03-18 NOTE — Progress Notes (Signed)
Patient ID: Stephanie Moreno, female   DOB: 12-Sep-1945, 75 y.o.   MRN: [id number]     Subjective: 3 Days Post-Op Procedure(s) (LRB): CENTRAL LAMINECTOMY LUMBAR FOUR-FIVE  AND LUMBAR FIVE-SACRAL ONE,TRANSFORAMINAL LUMBAR INTERBODY FUSION LEFT LUMBAR FIVE-SACRAL ONE WITH GLOBUS PEDICLE SCREWS, RODS AND SABLE CAGE, LOCAL BONE GRAFT, ALLOGRAFT BONE GRAFT (N/A) Awake, alert and oriented x 4. Up with PT to recliner. Slow with therapy some left leg weakness. Left knee gives away, doubt related to dural tear as the tear approximated left L5 root which should not affect knee strength. Appreciate Dr. Everett Graff input. Her work up for possible stroke is negative. She complaints of difficulty with chewing food to a consistency that she is able to swallow.  Patient reports pain as moderate.    Objective:   VITALS:  Temp:  [98.4 F (36.9 C)-101 F (38.3 C)] 98.4 F (36.9 C) (09/16 1419) Pulse Rate:  [85-102] 102 (09/16 1419) Resp:  [17-19] 18 (09/16 1419) BP: (122-149)/(49-92) 127/49 (09/16 1419) SpO2:  [97 %-100 %] 98 % (09/16 1419)  Neurologically intact ABD soft Neurovascular intact Sensation intact distally Intact pulses distally Dorsiflexion/Plantar flexion intact Incision: dressing C/D/I and no drainage   LABS Recent Labs    03/16/21 0102 03/17/21 0155  HGB 9.9* 10.0*  WBC 11.3* 12.1*  PLT 196 186   Recent Labs    03/16/21 0102 03/17/21 0155  NA 132* 136  K 4.0 3.9  CL 103 108  CO2 18* 20*  BUN 20 18  CREATININE 1.42* 1.39*  GLUCOSE 397* 122*   No results for input(s): LABPT, INR in the last 72 hours.   Assessment/Plan: 3 Days Post-Op Procedure(s) (LRB): CENTRAL LAMINECTOMY LUMBAR FOUR-FIVE  AND LUMBAR FIVE-SACRAL ONE,TRANSFORAMINAL LUMBAR INTERBODY FUSION LEFT LUMBAR FIVE-SACRAL ONE WITH GLOBUS PEDICLE SCREWS, RODS AND SABLE CAGE, LOCAL BONE GRAFT, ALLOGRAFT BONE GRAFT (N/A) Ileus post op laminectomy Anemia due to perioperative blood loss. Atelectasis post operative  improved with mobilizing patient.   Advance diet Up with therapy Weaning from Oxygen and IVF and will start SNF placement and sign FL-2 form.  Toxic metabolic encephalopathy possibly due to gabapentin and narcotics will stop  gabapentin that was added post op and return to preop less dose.   Basil Dess 03/18/2021, 3:28 PM

## 2021-03-18 NOTE — Progress Notes (Signed)
Physical Therapy Treatment Patient Details Name: Stephanie Moreno MRN: [id number] DOB: [birth year] Today's Date: 03/18/2021   History of Present Illness Pt is a 75 y/o female admitted 9/13 for low back pain and radiculopathy. S/P L4-5 and L5-S1 laminectomy, TLIF L L 5-S1. Had small CSF leak but okay to mobilize per Ortho.  PMH includes: anemia, anxiety, breast CA, DM, HTN, CKD, chronic back pain.    PT Comments    Patient received in bed, reports feeling slightly better today but still quite weak and needing a lot of assist for mobility. Bed mobility significantly improved, but needed Mod-MaxAx2 to stand and pivot to the recliner- and needed chair pulled up behind her, had a very hard time taking steps to pivot into recliner. L LE buckling much more than right. Left up in recliner with all needs met, chair alarm active, educated NT to strictly use +2 in stedy for staff and patient safety for now. Will continue efforts.     Recommendations for follow up therapy are one component of a multi-disciplinary discharge planning process, led by the attending physician.  Recommendations may be updated based on patient status, additional functional criteria and insurance authorization.  Follow Up Recommendations  SNF     Equipment Recommendations  Rolling walker with 5" wheels;3in1 (PT);Wheelchair (measurements PT);Wheelchair cushion (measurements PT)    Recommendations for Other Services       Precautions / Restrictions Precautions Precautions: Back Precaution Booklet Issued: Yes (comment) Precaution Comments: reviewed precautions Required Braces or Orthoses: Spinal Brace Spinal Brace: Lumbar corset;Applied in sitting position Restrictions Weight Bearing Restrictions: No     Mobility  Bed Mobility Overal bed mobility: Needs Assistance Bed Mobility: Rolling;Sidelying to Sit Rolling: Min assist Sidelying to sit: Min assist       General bed mobility comments: did better with bed mobility  today- only needed MinA for rolling and getting up to midline from sidelying today; still has multidirectional balance loss    Transfers Overall transfer level: Needs assistance Equipment used: Rolling walker (2 wheeled) Transfers: Sit to/from [facility] Sit to Stand: Mod assist;+2 physical assistance Stand pivot transfers: Max assist;+2 physical assistance       General transfer comment: ModAx2 to boost up to standing position with RW; legs still weak but improved, although L knee continued to have mild buckling. Able to take pivotal steps to recliner with MaxAX2 and chair brought up close behind her.  Ambulation/Gait             General Gait Details: unable- safety, BLEs buckling   Stairs             Wheelchair Mobility    Modified Rankin (Stroke Patients Only)       Balance Overall balance assessment: Needs assistance Sitting-balance support: No upper extremity supported;Feet supported;Bilateral upper extremity supported Sitting balance-Leahy Scale: Poor Sitting balance - Comments: multidirectional unsteadiness, tends to rely on BUE support   Standing balance support: Bilateral upper extremity supported;During functional activity Standing balance-Leahy Scale: Poor Standing balance comment: reliant on external support, barely able to maintain standing balance long enough to pivot to recliner                            Cognition Arousal/Alertness: Awake/alert Behavior During Therapy: Flat affect Overall Cognitive Status: Impaired/Different from baseline Area of Impairment: Problem solving;Memory;Following commands;Awareness  Memory: Decreased recall of precautions Following Commands: Follows one step commands consistently;Follows one step commands with increased time;Follows multi-step commands inconsistently   Awareness: Emergent Problem Solving: Slow processing;Requires verbal cues;Decreased  initiation;Difficulty sequencing General Comments: still with slow processing and had a hard time recalling precautions, but more alert and aware today. Still needed cues for problem solving and simple commands. Able to tell us she could not remember who the VP was earlier, told me she remembers now but did not say actual name of VP      Exercises      General Comments        Pertinent Vitals/Pain Pain Assessment: Faces Faces Pain Scale: Hurts even more Pain Location: back, L hip Pain Descriptors / Indicators: Discomfort;Sore Pain Intervention(s): Limited activity within patient's tolerance;Monitored during session    Home Living                      Prior Function            PT Goals (current goals can now be found in the care plan section) Acute Rehab PT Goals Patient Stated Goal: to get better PT Goal Formulation: With patient Time For Goal Achievement: 03/30/21 Potential to Achieve Goals: Good Progress towards PT goals: Not progressing toward goals - comment (change in functional presentation)    Frequency    Min 5X/week      PT Plan Equipment recommendations need to be updated;Current plan remains appropriate    Co-evaluation              AM-PAC PT "6 Clicks" Mobility   Outcome Measure  Help needed turning from your back to your side while in a flat bed without using bedrails?: A Little Help needed moving from lying on your back to sitting on the side of a flat bed without using bedrails?: A Little Help needed moving to and from a bed to a chair (including a wheelchair)?: Total Help needed standing up from a chair using your arms (e.g., wheelchair or bedside chair)?: A Lot Help needed to walk in hospital room?: Total Help needed climbing 3-5 steps with a railing? : Total 6 Click Score: 11    End of Session Equipment Utilized During Treatment: Gait belt;Back brace Activity Tolerance: Patient tolerated treatment well Patient left: in  chair;with call bell/phone within reach;with chair alarm set Nurse Communication: Mobility status;Need for lift equipment (stedy) PT Visit Diagnosis: Unsteadiness on feet (R26.81);History of falling (Z91.81)     Time: 1583-0940 PT Time Calculation (min) (ACUTE ONLY): 12 min  Charges:  $Therapeutic Activity: 8-22 mins                    Windell Norfolk, DPT, PN2   Supplemental Physical Therapist [place]    Pager [phone] Acute Rehab Office [phone]

## 2021-03-18 NOTE — Assessment & Plan Note (Signed)
continue statin

## 2021-03-18 NOTE — Assessment & Plan Note (Addendum)
--   No objective findings on examination today.  Imaging of the brain negative for stroke, no large vessel occlusion.  Given resolution of symptoms and known chronic longstanding solid food dysphagia, I do not suspect TIA.  May be secondary to pain medication or gabapentin (TID here but looks like just QHS at home). Will decrease to BID.  Follow-up echocardiogram, no further evaluation otherwise recommended. -- Continue to monitor.

## 2021-03-18 NOTE — Evaluation (Signed)
Clinical/Bedside Swallow Evaluation Patient Details  Name: Stephanie Moreno MRN: [id number] Date of Birth: [birth year]  Today's Date: 03/18/2021 Time: SLP Start Time (ACUTE ONLY): 0919 SLP Stop Time (ACUTE ONLY): [id number] SLP Time Calculation (min) (ACUTE ONLY): 25 min  Past Medical History:  Past Medical History:  Diagnosis Date   Acid reflux disease    Anemia    Anxiety    Asthma    Breast cancer ([place])    left side   Chronic back pain    Chronic kidney disease    Diabetes mellitus without complication ([place])    Diverticulitis    Hypertension    Lumbosacral spondylosis    Per previous PCP records   Sleep apnea    Thyroid disease    Past Surgical History:  Past Surgical History:  Procedure Laterality Date   ABDOMINAL HYSTERECTOMY     BIOPSY  02/19/2020   Procedure: BIOPSY;  Surgeon: Eloise Harman, DO;  Location: [facility];  Service: Endoscopy;;   BREAST LUMPECTOMY Left 2003   BREAST SURGERY     COLONOSCOPY WITH PROPOFOL N/A 02/19/2020   non-bleeding internal hemorrhoids, many small-mouthed diverticula in entire colon.   ESOPHAGOGASTRODUODENOSCOPY (EGD) WITH PROPOFOL N/A 02/19/2020    benign-appearing esophageal stenosis s/p dilation, gastritis s/p biopsy, normal duodenum. Negative H.pylori, negative duodenal biopsy.    GIVENS CAPSULE STUDY N/A 06/14/2020   Procedure: GIVENS CAPSULE STUDY;  Surgeon: Eloise Harman, DO;  Location: [facility];  Service: Endoscopy;  Laterality: N/A;  7:30am   THYROIDECTOMY     tummy tuck     at time of breast surgery 2003   HPI:  Pt is a 75 y/o female admitted 9/13 for low back pain and radiculopathy. S/P L4-5 and L5-S1 laminectomy, TLIF L L 5-S1.  Neurological changes concerning for stroke. CT negative for acute abnormality. Per BSE order, pt has pharyngeal globus sensation and coughing after swallowing. PMH includes: GERD, anemia, anxiety, breast CA, DM, HTN, CKD, chronic back pain.    Assessment / Plan / Recommendation  Clinical  Impression  Pt exhibits suspected esophageal dysphagia impacted by oral difficulties with solid textures. She reports have esophageal dilation 6-8 months ago. Pt report was  difficult to discern regurgitation versus mastication. Upon further discussion it seems mastication is difficult and frequently expectorates meat if cannot orally transit. There is globus sensation as well with history of GER and obseved eructation and question of food ascending esophagus and pt re- swallowing (?). She was agreeable to chopped meats for Dys 3 texture, continue thin liquids. Educated pt on strategies to minimize reflux symptoms and choose more tolerable foods from menu. Also encouraged her to visit GI if her symptoms do not improve once stronger and recovered from surgery. She voiced understanding however uncertain how cognition is. No further ST needed at this time. SLP Visit Diagnosis: Dysphagia, unspecified (R13.10)    Aspiration Risk  Mild aspiration risk    Diet Recommendation Dysphagia 3 (Mech soft);Thin liquid   Liquid Administration via: Cup;Straw Medication Administration: Whole meds with liquid Supervision: Staff to assist with self feeding;Patient able to self feed Compensations: Slow rate;Small sips/bites Postural Changes: Remain upright for at least 30 minutes after po intake;Seated upright at 90 degrees    Other  Recommendations Recommended Consults: Consider GI evaluation Oral Care Recommendations: Oral care BID    Recommendations for follow up therapy are one component of a multi-disciplinary discharge planning process, led by the attending physician.  Recommendations may be updated based on  patient status, additional functional criteria and insurance authorization.  Follow up Recommendations None      Frequency and Duration            Prognosis        Swallow Study   General Date of Onset: 03/16/21 HPI: Pt is a 75 y/o female admitted 9/13 for low back pain and radiculopathy. S/P  L4-5 and L5-S1 laminectomy, TLIF L L 5-S1.  Neurological changes concerning for stroke. CT negative for acute abnormality. Per BSE order, pt has pharyngeal globus sensation and coughing after swallowing. PMH includes: GERD, anemia, anxiety, breast CA, DM, HTN, CKD, chronic back pain. Type of Study: Bedside Swallow Evaluation Previous Swallow Assessment:  (no) Diet Prior to this Study: NPO Temperature Spikes Noted: No Respiratory Status: Nasal cannula History of Recent Intubation: Yes Length of Intubations (days):  (during surgery) Date extubated: 03/15/21 Behavior/Cognition: Alert;Cooperative;Pleasant mood Oral Cavity Assessment: Within Functional Limits Oral Care Completed by SLP: No Oral Cavity - Dentition: Dentures, top;Dentures, bottom Vision: Functional for self-feeding Self-Feeding Abilities: Needs assist;Needs set up Patient Positioning: Upright in bed Baseline Vocal Quality: Normal Volitional Cough: Strong Volitional Swallow: Able to elicit    Oral/Motor/Sensory Function Overall Oral Motor/Sensory Function: Within functional limits   Ice Chips Ice chips: Not tested   Thin Liquid Thin Liquid: Within functional limits    Nectar Thick Nectar Thick Liquid: Not tested   Honey Thick Honey Thick Liquid: Not tested   Puree Puree: Within functional limits   Solid     Solid: Within functional limits      Houston Siren 03/18/2021,9:59 AM

## 2021-03-18 NOTE — Assessment & Plan Note (Signed)
--   Not on exam now.  Breath sounds unremarkable.  No documented hypoxia.  Recommend weaning off oxygen and monitoring.  Chest x-ray showed atelectasis which probably explains fever from yesterday.  2D echocardiogram in June of this year showed indeterminant diastolic function.  Normal LVEF.  No signs or symptoms of volume overload. -- Monitor.

## 2021-03-18 NOTE — Assessment & Plan Note (Signed)
--  stable, continue amlodipine

## 2021-03-18 NOTE — Hospital Course (Addendum)
78yow PMH L4-5, L5-S1 stenosis with low back pain and lower extremity radiculopathy admitted 9/13 for elective surgery by orthopedics.  Underwent central laminectomy L4-5 and L5-S1 9/13.  9/15 was noted to have some difficulty speaking, wheezing, swallowing and therefore hospitalist consultation requested.  Workup negative for acute CNS process, and wheezing resolved w/o intervention. Slurred speech secondary to gabapentin dose higher than home dose. Clear for discharge from medical perspective.

## 2021-03-18 NOTE — Assessment & Plan Note (Addendum)
--  CBG stable, CKD stable --continue SSI, basal insulin -- Baseline creatinine around 1.4

## 2021-03-18 NOTE — Progress Notes (Signed)
PROGRESS NOTE  Stephanie Moreno [id number] DOB: 03/12/1946 DOA: 03/15/2021 PCP: No primary care provider on file.  Brief History   75yow PMH L4-5, L5-S1 stenosis with low back pain and lower extremity radiculopathy admitted 9/13 for elective surgery by orthopedics.  Underwent central laminectomy L4-5 and L5-S1 9/13.  9/15 was noted to have some difficulty speaking, wheezing, swallowing and therefore hospitalist consultation requested.  Etiology of difficulty speaking and wheezing unclear.  Chest x-ray no acute disease.  No focal neurologic symptoms on follow-up, imaging of the brain and vessels were unremarkable.  Continue to monitor.  A & P  * Fusion of spine of lumbar region --as per orthopedics  Ileus ([place]) --per orthopedics  Wheezing -- Not on exam now.  Breath sounds unremarkable.  No documented hypoxia.  Recommend weaning off oxygen and monitoring.  Chest x-ray showed atelectasis which probably explains fever from yesterday.  2D echocardiogram in June of this year showed indeterminant diastolic function.  Normal LVEF.  No signs or symptoms of volume overload. -- Monitor.  Speech disturbance -- No objective findings on examination today.  Imaging of the brain negative for stroke, no large vessel occlusion.  Given resolution of symptoms and known chronic longstanding solid food dysphagia, I do not suspect TIA.  May be secondary to pain medication or gabapentin (TID here but looks like just QHS at home). Will decrease to BID.  Follow-up echocardiogram, no further evaluation otherwise recommended. -- Continue to monitor.  Dysphagia -- Chronic, has had solid food dysphagia for some time, previously seen in [place] for same with GI and dilatation performed. -- Continue diet as an outpatient.  No signs or symptoms on x-ray to suggest aspiration pneumonia or pneumonitis.  Type 2 diabetes mellitus with stage 3b chronic kidney disease, without long-term current use of insulin (HCC) --CBG  stable, CKD stable --continue SSI, basal insulin -- Baseline creatinine around 1.4  Mixed hyperlipidemia --continue statin  Essential hypertension, benign --stable, continue amlodipine  Acute issues appear to be resolving or resolved.  Wean off oxygen and follow clinically.  No further recommendations presently.  TRH will continue to follow.  DVT prophylaxis: SCD's Start: 03/15/21 1533   Code Status: Full Code Level of care: Med-Surg Family Communication: none  Murray Hodgkins, MD  [facility] Direct contact: see [email or web address] (further directions at bottom of note if needed) 7PM-7AM contact night coverage as at bottom of note 03/18/2021, 2:43 PM  LOS: 3 days   Significant Hospital Events   9/13 admit for elective lumbar surgery   Consults:  TRH   Procedures:  9/13 CENTRAL LAMINECTOMY LUMBAR FOUR-FIVE  AND LUMBAR FIVE-SACRAL ONE,TRANSFORAMINAL LUMBAR INTERBODY FUSION LEFT LUMBAR FIVE-SACRAL ONE WITH GLOBUS PEDICLE SCREWS, RODS AND SABLE CAGE, LOCAL BONE GRAFT, ALLOGRAFT BONE GRAFT  Significant Diagnostic Tests:     Micro Data:     Antimicrobials:    Interval History/Subjective  CC: f/u slurred speech  Feels better Speech still feels slow No dysphagia today, but has h/o solid food dysphagia and previous EGD w/ dilation Has h/o OSA, used CPAP in [place] but moved here and doesn't have machine yet; did have sleep study recently.  Objective   Vitals:  Vitals:   03/18/21 1232 03/18/21 1419  BP: 124/61 (!) 127/49  Pulse: 86 (!) 102  Resp: 17 18  Temp: 98.6 F (37 C) 98.4 F (36.9 C)  SpO2: 98% 98%    Exam: Physical Exam Vitals and nursing note reviewed.  Constitutional:      General: She is  not in acute distress.    Appearance: Normal appearance.  Cardiovascular:     Rate and Rhythm: Normal rate and regular rhythm.     Heart sounds: No murmur heard.   No friction rub. No gallop.  Pulmonary:     Effort: Respiratory distress present.      Breath sounds: Normal breath sounds. No wheezing, rhonchi or rales.  Abdominal:     Palpations: Abdomen is soft.  Musculoskeletal:     Right lower leg: No edema.     Left lower leg: No edema.  Neurological:     Mental Status: She is alert.     Cranial Nerves: No cranial nerve deficit.     Sensory: No sensory deficit.     Motor: No weakness.  Psychiatric:        Mood and Affect: Mood normal.        Behavior: Behavior normal.   I have personally reviewed the labs and other data, making special note of:   Today's Data  MRI brain negative MRA head/neck negative  Scheduled Meds:  amitriptyline  25 mg Oral QHS   amLODipine  5 mg Oral Daily   budesonide (PULMICORT) nebulizer solution  0.25 mg Nebulization BID   Chlorhexidine Gluconate Cloth  6 each Topical Daily   docusate sodium  100 mg Oral BID   gabapentin  300 mg Oral BID   HYDROcodone-acetaminophen  1 tablet Oral Q6H   insulin aspart  0-15 Units Subcutaneous TID WC   insulin aspart  3 Units Subcutaneous TID WC   insulin aspart  3 Units Subcutaneous QHS   insulin glargine-yfgn  20 Units Subcutaneous QHS   linaclotide  145 mcg Oral QAC breakfast   metoCLOPramide  5 mg Oral TID AC & HS   pantoprazole  40 mg Oral Daily   polyethylene glycol  17 g Oral Daily   rosuvastatin  10 mg Oral QHS   sucralfate  1 g Oral TID WC & HS   Continuous Infusions:  sodium chloride 100 mL/hr at 03/17/21 1838   methocarbamol (ROBAXIN) IV      Principal Problem:   Fusion of spine of lumbar region Active Problems:   Type 2 diabetes mellitus with stage 3b chronic kidney disease, without long-term current use of insulin (HCC)   Dysphagia   Speech disturbance   Wheezing   Ileus (HCC)   Mixed hyperlipidemia   Iron deficiency anemia   Essential hypertension, benign   GERD (gastroesophageal reflux disease)   LOS: 3 days   How to contact the [facility] Attending or Consulting provider 7A - 7P or covering provider during after hours 7P -7A, for this  patient?  Check the care team in [facility] and look for a) attending/consulting TRH provider listed and b) the [facility] team listed Log into [email or web address] and use [place] universal password to access. If you do not have the password, please contact the hospital operator. Locate the [facility] provider you are looking for under [facility] and page to a number that you can be directly reached. If you still have difficulty reaching the provider, please page the [facility] (Director on Call) for the Hospitalists listed on amion for assistance.

## 2021-03-18 NOTE — TOC Progression Note (Signed)
Transition of Care [facility]) - Initial/Assessment Note    Patient Details  Name: Stephanie Moreno MRN: [id number] Date of Birth: 12/08/1945  Transition of Care [facility]) CM/SW Contact:    Milinda Antis, LCSWA Phone Number: 03/18/2021, 5:13 PM  Clinical Narrative:                 CSW contacted Debbie with [facility] to verify that the patient could attend the facility.  There was no answer.  CSW left a VM requesting a returned call.  The facility does not accept over the weekend.    Insurance Josem Kaufmann is approved:  Candace Cruise id# [id number], approved , 9/15 - 9/19, next review date 9/19  Expected Discharge Plan: [place] Barriers to Discharge: Continued Medical Work up   Patient Goals and CMS Choice     Choice offered to / list presented to : Patient  Expected Discharge Plan and Services Expected Discharge Plan: [place]   Discharge Planning Services: CM Consult Post Acute Care Choice: [place] arrangements for the past 2 months: Single Family Home                                      Prior Living Arrangements/Services Living arrangements for the past 2 months: Single Family Home Lives with:: Self Patient language and need for interpreter reviewed:: Yes Do you feel safe going back to the place where you live?: Yes      Need for Family Participation in Patient Care: Yes (Comment) Care giver support system in place?: Yes (comment) Current home services: DME Criminal Activity/Legal Involvement Pertinent to Current Situation/Hospitalization: No - Comment as needed  Activities of Daily Living Home Assistive Devices/Equipment: Eyeglasses, Dentures (specify type), CBG Meter, Blood pressure cuff, Walker (specify type) (Walk in shower) ADL Screening (condition at time of admission) Patient's cognitive ability adequate to safely complete daily activities?: Yes Is the patient deaf or have difficulty hearing?: No Does the patient have difficulty  seeing, even when wearing glasses/contacts?: No Does the patient have difficulty concentrating, remembering, or making decisions?: No Patient able to express need for assistance with ADLs?: Yes Does the patient have difficulty dressing or bathing?: No Independently performs ADLs?: Yes (appropriate for developmental age) Does the patient have difficulty walking or climbing stairs?: Yes Weakness of Legs: Left Weakness of Arms/Hands: Left  Permission Sought/Granted Permission sought to share information with : Family Supports, Case Manager Permission granted to share information with : Yes, Verbal Permission Granted  Share Information with NAME: [name] (Daughter) [phone]           Emotional Assessment   Attitude/Demeanor/Rapport: Engaged Affect (typically observed): Accepting Orientation: : Oriented to Self, Oriented to Situation, Oriented to Place, Oriented to  Time Alcohol / Substance Use: Other (comment)    Admission diagnosis:  Fusion of spine of lumbar region [M43.26] Patient Active Problem List   Diagnosis Date Noted   Dysphagia 03/18/2021   Speech disturbance 03/18/2021   Wheezing 03/18/2021   Ileus ([place]) 03/18/2021   Fusion of spine of lumbar region 03/15/2021   Cerebrovascular disease 12/20/2020   GERD (gastroesophageal reflux disease) 11/18/2020   Type 2 diabetes mellitus with stage 3b chronic kidney disease, without long-term current use of insulin ([place]) 07/12/2020   Mixed hyperlipidemia 07/12/2020   Essential hypertension, benign 07/12/2020   Iron deficiency anemia    Loss of weight 04/09/2020  Dyspepsia 01/28/2020   Constipation 01/28/2020   Lower abdominal pain 01/28/2020   Normocytic anemia 01/28/2020   PCP:  No primary care provider on file. Pharmacy:   [id number] [place]Phone: [phone]: [address] [place] [place] [address]Phone: [phone]: [phone]    Social Determinants of Health (SDOH) Interventions    Readmission Risk Interventions No flowsheet data found.

## 2021-03-18 NOTE — Care Management Important Message (Signed)
Important Message  Patient Details  Name: Stephanie Moreno MRN: [id number] Date of Birth: 26-Oct-1945   Medicare Important Message Given:  Yes     Orbie Pyo 03/18/2021, 3:11 PM

## 2021-03-18 NOTE — Progress Notes (Signed)
   03/18/21 0947  Assess: MEWS Score  Temp 99.5 F (37.5 C)  BP 129/63  Pulse Rate 100  Resp 19  SpO2 100 %  O2 Device Nasal Cannula  Assess: MEWS Score  MEWS Temp 0  MEWS Systolic 0  MEWS Pulse 0  MEWS RR 0  MEWS LOC 0  MEWS Score 0  MEWS Score Color Stephanie Moreno

## 2021-03-18 NOTE — Assessment & Plan Note (Signed)
--  as per orthopedics

## 2021-03-18 NOTE — Assessment & Plan Note (Signed)
--   Chronic, has had solid food dysphagia for some time, previously seen in [place] for same with GI and dilatation performed. -- Continue diet as an outpatient.  No signs or symptoms on x-ray to suggest aspiration pneumonia or pneumonitis.

## 2021-03-19 ENCOUNTER — Other Ambulatory Visit (HOSPITAL_COMMUNITY): Payer: Medicare Other

## 2021-03-19 LAB — GLUCOSE, CAPILLARY
Glucose-Capillary: 135 mg/dL — ABNORMAL HIGH (ref 70–99)
Glucose-Capillary: 259 mg/dL — ABNORMAL HIGH (ref 70–99)
Glucose-Capillary: 194 mg/dL — ABNORMAL HIGH (ref 70–99)
Glucose-Capillary: 303 mg/dL — ABNORMAL HIGH (ref 70–99)

## 2021-03-19 LAB — SARS CORONAVIRUS 2 (TAT 6-24 HRS): SARS Coronavirus 2: NEGATIVE

## 2021-03-19 MED ORDER — POLYETHYLENE GLYCOL 3350 17 G PO PACK: 17.0000 g | PACK | Freq: Every day | ORAL | 0 refills | Status: DC

## 2021-03-19 MED ORDER — POLYETHYLENE GLYCOL 3350 17 G PO PACK: 17.0000 g | PACK | Freq: Every day | ORAL | 0 refills | Status: DC | PRN

## 2021-03-19 MED ORDER — METHOCARBAMOL 500 MG PO TABS: 500.0000 mg | ORAL_TABLET | Freq: Four times a day (QID) | ORAL | 1 refills | Status: DC | PRN

## 2021-03-19 MED ORDER — HYDROCODONE-ACETAMINOPHEN 10-325 MG PO TABS: 1.0000 | ORAL_TABLET | ORAL | 0 refills | Status: DC | PRN

## 2021-03-19 MED ORDER — GABAPENTIN 300 MG PO CAPS
300.0000 mg | ORAL_CAPSULE | Freq: Every day | ORAL | Status: DC
  Administered 2021-03-19: 300 mg via ORAL
  Filled 2021-03-19: qty 1

## 2021-03-19 MED ORDER — GABAPENTIN 300 MG PO CAPS
300.0000 mg | ORAL_CAPSULE | Freq: Every day | ORAL | 3 refills | Status: DC
Start: 2021-03-19 — End: 2021-07-14

## 2021-03-19 NOTE — TOC Transition Note (Signed)
Transition of Care [facility]) - CM/SW Discharge Note   Patient Details  Name: Stephanie Moreno MRN: [id number] Date of Birth: [birth year]  Transition of Care [facility]) CM/SW Contact:  Joanne Chars, LCSW Phone Number: 03/19/2021, 3:41 PM   Clinical Narrative:   Pt [facility].  Per Jackelyn Poling at [place], [place] to transport with covid result still pending.  RN call report to [phone].     Final next level of care: Skilled Nursing Facility Barriers to Discharge: Barriers Resolved   Patient Goals and CMS Choice     Choice offered to / list presented to : Patient  Discharge Placement              Patient chooses bed at:  [facility]) Patient to be transferred to facility by: [place] Name of family member notified: left message with daughter [name] Patient and family notified of of transfer: 03/19/21  Discharge Plan and Services   Discharge Planning Services: CM Consult Post Acute Care Choice: Home Health                               Social Determinants of Health (SDOH) Interventions     Readmission Risk Interventions No flowsheet data found.

## 2021-03-19 NOTE — Progress Notes (Signed)
Patient ID: Stephanie Moreno, female   DOB: 03/24/1946, 75 y.o.   MRN: [id number] Patient is status post lumbar spine fusion.  She states she still has muscle spasms in her back.  Plan for discharge to skilled nursing.

## 2021-03-19 NOTE — Progress Notes (Signed)
     Subjective: 4 Days Post-Op Procedure(s) (LRB): CENTRAL LAMINECTOMY LUMBAR FOUR-FIVE  AND LUMBAR FIVE-SACRAL ONE,TRANSFORAMINAL LUMBAR INTERBODY FUSION LEFT LUMBAR FIVE-SACRAL ONE WITH GLOBUS PEDICLE SCREWS, RODS AND SABLE CAGE, LOCAL BONE GRAFT, ALLOGRAFT BONE GRAFT (N/A) Called as SNF is emminent and patient requires DC summary and orders. Plan is to [facility].   Patient reports pain as moderate.    Objective:   VITALS:  Temp:  [98 F (36.7 C)-98.8 F (37.1 C)] 98.5 F (36.9 C) (09/17 0735) Pulse Rate:  [83-102] 102 (09/17 0919) Resp:  [17-19] 18 (09/17 0919) BP: (127-168)/(49-82) 147/65 (09/17 0735) SpO2:  [95 %-99 %] 95 % (09/17 0919)  Neurologically intact ABD soft Neurovascular intact Sensation intact distally Intact pulses distally Dorsiflexion/Plantar flexion intact Incision: dressing C/D/I, no drainage, and some dry drainage on dressing. Mild swelling at opsite likely a small amount of hematoma or seroma.    LABS Recent Labs    03/17/21 0155  HGB 10.0*  WBC 12.1*  PLT 186   Recent Labs    03/17/21 0155  NA 136  K 3.9  CL 108  CO2 20*  BUN 18  CREATININE 1.39*  GLUCOSE 122*   No results for input(s): LABPT, INR in the last 72 hours.   Assessment/Plan: 4 Days Post-Op Procedure(s) (LRB): CENTRAL LAMINECTOMY LUMBAR FOUR-FIVE  AND LUMBAR FIVE-SACRAL ONE,TRANSFORAMINAL LUMBAR INTERBODY FUSION LEFT LUMBAR FIVE-SACRAL ONE WITH GLOBUS PEDICLE SCREWS, RODS AND SABLE CAGE, LOCAL BONE GRAFT, ALLOGRAFT BONE GRAFT (N/A)  Advance diet Up with therapy D/C IV fluids Discharge to SNF COVID STAT screen for SNF placement placed.   Basil Dess 03/19/2021, 2:05 PM Patient ID: Stephanie Moreno, female   DOB: 1946/06/05, 75 y.o.   MRN: [id number]

## 2021-03-19 NOTE — Plan of Care (Signed)

## 2021-03-19 NOTE — Progress Notes (Signed)
Physical Therapy Treatment Patient Details Name: Stephanie Moreno MRN: [id number] DOB: March 27, 1946 Today's Date: 03/19/2021   History of Present Illness Pt is a 75 y/o female admitted 9/13 for low back pain and radiculopathy. S/P L4-5 and L5-S1 laminectomy, TLIF L L 5-S1. Had small CSF leak but okay to mobilize per Ortho.  PMH includes: anemia, anxiety, breast CA, DM, HTN, CKD, chronic back pain.    PT Comments    Patient received up in recliner. She is agreeable to PT session. Patient requires mod assist for initial standing from recliner with cues for hand placement. She has posterior lean in standing. With increased reps of standing she required min assist. She is able to march in place while standing but fatigues easily and is pain limited. Patient will continue to benefit from skilled PT to improve functional independence, strength and safety with mobility.     Recommendations for follow up therapy are one component of a multi-disciplinary discharge planning process, led by the attending physician.  Recommendations may be updated based on patient status, additional functional criteria and insurance authorization.  Follow Up Recommendations  SNF     Equipment Recommendations  Other (comment) (TBD next venue)    Recommendations for Other Services       Precautions / Restrictions Precautions Precautions: Back Precaution Booklet Issued: Yes (comment) Precaution Comments: reviewed precautions Required Braces or Orthoses: Spinal Brace Spinal Brace: Lumbar corset;Applied in sitting position Restrictions Weight Bearing Restrictions: No     Mobility  Bed Mobility               General bed mobility comments: patient received in recliner.    Transfers Overall transfer level: Needs assistance Equipment used: Rolling walker (2 wheeled) Transfers: Sit to/from Stand Sit to Stand: Mod assist         General transfer comment: Performed sit to stand x 3 reps from recliner with  mod assist. Min assist by 3rd rep. Increased time and effort needed to get to full standing.  Ambulation/Gait             General Gait Details: Performed marching in place each time when standing. L LE buckling and R LE difficult to pick up.   Stairs             Wheelchair Mobility    Modified Rankin (Stroke Patients Only)       Balance Overall balance assessment: Needs assistance Sitting-balance support: Feet supported Sitting balance-Leahy Scale: Good Sitting balance - Comments: able to sit edge of recliner without difficulty   Standing balance support: Bilateral upper extremity supported;During functional activity Standing balance-Leahy Scale: Poor Standing balance comment: reliant on B UE support, posterior leaning with standing                            Cognition Arousal/Alertness: Awake/alert Behavior During Therapy: WFL for tasks assessed/performed Overall Cognitive Status: Within Functional Limits for tasks assessed                       Memory: Decreased recall of precautions Following Commands: Follows one step commands consistently     Problem Solving: Requires verbal cues;Requires tactile cues        Exercises Other Exercises Other Exercises: Marching in place with standing x 3 reps    General Comments        Pertinent Vitals/Pain Pain Assessment: Faces Faces Pain Scale: Hurts even more Pain Location: back,  L hip Pain Descriptors / Indicators: Discomfort;Grimacing;Guarding Pain Intervention(s): Limited activity within patient's tolerance;Monitored during session;Repositioned    Home Living                      Prior Function            PT Goals (current goals can now be found in the care plan section) Acute Rehab PT Goals Patient Stated Goal: to go to rehab and get better PT Goal Formulation: With patient Time For Goal Achievement: 03/30/21 Potential to Achieve Goals: Good Progress towards PT  goals: Progressing toward goals    Frequency    Min 5X/week      PT Plan Current plan remains appropriate    Co-evaluation              AM-PAC PT "6 Clicks" Mobility   Outcome Measure  Help needed turning from your back to your side while in a flat bed without using bedrails?: A Little Help needed moving from lying on your back to sitting on the side of a flat bed without using bedrails?: A Little Help needed moving to and from a bed to a chair (including a wheelchair)?: A Lot Help needed standing up from a chair using your arms (e.g., wheelchair or bedside chair)?: A Lot Help needed to walk in hospital room?: Total Help needed climbing 3-5 steps with a railing? : Total 6 Click Score: 12    End of Session Equipment Utilized During Treatment: Gait belt;Back brace Activity Tolerance: Patient limited by fatigue;Patient limited by pain Patient left: in chair Nurse Communication: Mobility status PT Visit Diagnosis: Other abnormalities of gait and mobility (R26.89);Muscle weakness (generalized) (M62.81);Unsteadiness on feet (R26.81);Difficulty in walking, not elsewhere classified (R26.2);Pain Pain - part of body:  (back)     Time: [id number] PT Time Calculation (min) (ACUTE ONLY): 18 min  Charges:  $Therapeutic Exercise: 8-22 mins                     [employer], PT, G[employer] 03/19/21,3:29 PM

## 2021-03-19 NOTE — Progress Notes (Signed)
1642 - Report was called to RN Lerry Paterson at [facility] ([id number]).

## 2021-03-19 NOTE — Progress Notes (Signed)
PROGRESS NOTE  Stephanie Moreno [id number] DOB: Nov 16, 1945 DOA: 03/15/2021 PCP: No primary care provider on file.  Brief History   75yow PMH L4-5, L5-S1 stenosis with low back pain and lower extremity radiculopathy admitted 9/13 for elective surgery by orthopedics.  Underwent central laminectomy L4-5 and L5-S1 9/13.  9/15 was noted to have some difficulty speaking, wheezing, swallowing and therefore hospitalist consultation requested.  Workup negative for acute CNS process, and wheezing resolved w/o intervention. Slurred speech secondary to gabapentin dose higher than home dose. Clear for discharge from medical perspective.  A & P   Wheezing -- resolved, off oxygen. Chest x-ray showed atelectasis which probably explains fever from yesterday.  2D echocardiogram in June of this year showed indeterminant diastolic function.  Normal LVEF.  No signs or symptoms of volume overload. -- no further evaluation suggested.  Speech disturbance -- No objective findings on examination again today.  Imaging of the brain negative for stroke, no large vessel occlusion.  Given resolution of symptoms and known chronic longstanding solid food dysphagia, I do not suspect TIA.  Most likely secondary to gabapentin (TID here but looks like just QHS at home). Resume home dose -- no further evaluation recommended  Dysphagia -- Chronic, has had solid food dysphagia for some time, previously seen in [place] for same with GI and dilatation performed. -- soft diet on discharge and recommend outpatient GI evaluation  Type 2 diabetes mellitus with stage 3b chronic kidney disease, without long-term current use of insulin (HCC) --CBG stable, CKD stable --continue SSI, basal insulin -- Baseline creatinine around 1.4  Mixed hyperlipidemia --continue statin  Essential hypertension, benign --stable, continue amlodipine  Acute issues appear to be resolved. No further recommendations. Medically stable for discharge.  Will sign off.  DVT prophylaxis: SCD's Start: 03/15/21 1533   Code Status: Full Code Level of care: Med-Surg Family Communication: none  Murray Hodgkins, MD  [facility] Direct contact: see [email or web address] (further directions at bottom of note if needed) 7PM-7AM contact night coverage as at bottom of note 03/19/2021, 12:37 PM  LOS: 4 days   Significant Hospital Events   9/13 admit for elective lumbar surgery   Consults:  [facility]   Procedures:  9/13 CENTRAL LAMINECTOMY LUMBAR FOUR-FIVE  AND LUMBAR FIVE-SACRAL ONE,TRANSFORAMINAL LUMBAR INTERBODY FUSION LEFT LUMBAR FIVE-SACRAL ONE WITH GLOBUS PEDICLE SCREWS, RODS AND SABLE CAGE, LOCAL BONE GRAFT, ALLOGRAFT BONE GRAFT  Significant Diagnostic Tests:     Micro Data:     Antimicrobials:    Interval History/Subjective  CC: f/u slurred speech  Feels better Speech better No difficulty w/ limbs Swallowing fine  Objective   Vitals:  Vitals:   03/19/21 0735 03/19/21 0919  BP: (!) 147/65   Pulse: 96 (!) 102  Resp: 19 18  Temp: 98.5 F (36.9 C)   SpO2: 99% 95%    Exam: Physical Exam Vitals and nursing note reviewed.  Constitutional:      General: She is not in acute distress. Cardiovascular:     Rate and Rhythm: Normal rate and regular rhythm.     Heart sounds: No murmur heard. Pulmonary:     Effort: Pulmonary effort is normal.     Breath sounds: Normal breath sounds. No wheezing.  Neurological:     General: No focal deficit present.     Mental Status: She is alert.  Psychiatric:        Mood and Affect: Mood normal.        Behavior: Behavior normal.   I have personally  reviewed the labs and other data, making special note of:   Today's Data  CBG stable  Scheduled Meds:  amitriptyline  25 mg Oral QHS   amLODipine  5 mg Oral Daily   budesonide (PULMICORT) nebulizer solution  0.25 mg Nebulization BID   Chlorhexidine Gluconate Cloth  6 each Topical Daily   docusate sodium  100 mg Oral BID   gabapentin  300 mg  Oral QHS   HYDROcodone-acetaminophen  1 tablet Oral Q6H   insulin aspart  0-15 Units Subcutaneous TID WC   insulin aspart  3 Units Subcutaneous TID WC   insulin aspart  3 Units Subcutaneous QHS   insulin glargine-yfgn  20 Units Subcutaneous QHS   linaclotide  145 mcg Oral QAC breakfast   metoCLOPramide  5 mg Oral TID AC & HS   pantoprazole  40 mg Oral Daily   polyethylene glycol  17 g Oral Daily   rosuvastatin  10 mg Oral QHS   sucralfate  1 g Oral TID WC & HS   Continuous Infusions:  sodium chloride 100 mL/hr at 03/19/21 1033   methocarbamol (ROBAXIN) IV      Principal Problem:   Fusion of spine of lumbar region Active Problems:   Type 2 diabetes mellitus with stage 3b chronic kidney disease, without long-term current use of insulin (HCC)   Dysphagia   Speech disturbance   Wheezing   Ileus (HCC)   Mixed hyperlipidemia   Iron deficiency anemia   Essential hypertension, benign   GERD (gastroesophageal reflux disease)   LOS: 4 days   How to contact the [facility] Attending or Consulting provider 7A - 7P or covering provider during after hours 7P -7A, for this patient?  Check the care team in [facility] and look for a) attending/consulting [facility] provider listed and b) the [facility] team listed Log into [email or web address] and use [facility]'s universal password to access. If you do not have the password, please contact the hospital operator. Locate the [facility] provider you are looking for under [facility] and page to a number that you can be directly reached. If you still have difficulty reaching the provider, please page the [facility] (Director on Call) for the Hospitalists listed on amion for assistance.

## 2021-03-20 NOTE — Progress Notes (Addendum)
PTAR on unit for pt to be discharged to facility. Called the facility to clarify if it was okay for pt to arrive at this hour, facility aware. Pt discharged, central tele made aware,Personal belongings sent with pt. Pt has a walker that was not  able to be sent with transport, will notify daughter at appropriate time.

## 2021-03-20 NOTE — Progress Notes (Signed)
Called sister Nori Riis) and informed her pt has discharged and her personal walker is still in facility at front of unit with name tag.

## 2021-03-22 DIAGNOSIS — M48062 Spinal stenosis, lumbar region with neurogenic claudication: Secondary | ICD-10-CM

## 2021-03-22 DIAGNOSIS — G894 Chronic pain syndrome: Secondary | ICD-10-CM | POA: Insufficient documentation

## 2021-03-22 DIAGNOSIS — M4316 Spondylolisthesis, lumbar region: Secondary | ICD-10-CM

## 2021-03-23 MED FILL — Heparin Sodium (Porcine) Inj 1000 Unit/ML: INTRAMUSCULAR | Qty: 30 | Status: AC

## 2021-03-23 MED FILL — Sodium Chloride IV Soln 0.9%: INTRAVENOUS | Qty: 1000 | Status: AC

## 2021-03-31 ENCOUNTER — Encounter: Payer: Self-pay | Admitting: Surgery

## 2021-03-31 ENCOUNTER — Ambulatory Visit (INDEPENDENT_AMBULATORY_CARE_PROVIDER_SITE_OTHER): Payer: Medicare Other | Admitting: Surgery

## 2021-03-31 ENCOUNTER — Other Ambulatory Visit: Payer: Self-pay

## 2021-03-31 VITALS — BP 130/80 | HR 80

## 2021-03-31 DIAGNOSIS — Z981 Arthrodesis status: Secondary | ICD-10-CM

## 2021-03-31 NOTE — Progress Notes (Signed)
Post-Op Visit Note   Patient: Stephanie Moreno           Date of Birth: [birth year]           MRN: [id number] Visit Date: 03/31/2021 PCP: No primary care provider on file.   Assessment & Plan:  Chief Complaint:  Chief Complaint  Patient presents with   Lower Back - Post-op Follow-up  75 year old black female who is status post L4-5 and L5-S1 decompressions and L5-S1 fusion returns.  Patient is doing rehab at a skilled facility.  States that she is making progress.  Assistant from the facility present. Visit Diagnoses:  1. S/P lumbar fusion     Plan: Patient continue PT protocol at the skilled facility.  Follow-up with Dr. Louanne Skye in 4 weeks for recheck.  Return sooner if needed.  Must continue wearing brace until 12 weeks postop  Follow-Up Instructions: Return in about 4 weeks (around 04/28/2021) for with dr Louanne Skye for lumbar fusion postop.   Orders:  No orders of the defined types were placed in this encounter.  No orders of the defined types were placed in this encounter.   Imaging: No results found.  PMFS History: Patient Active Problem List   Diagnosis Date Noted   Spondylolisthesis, lumbar region    Spinal stenosis, lumbar region, with neurogenic claudication    Dysphagia 03/18/2021   Speech disturbance 03/18/2021   Wheezing 03/18/2021   Ileus ([place]) 03/18/2021   Fusion of spine of lumbar region 03/15/2021   Cerebrovascular disease 12/20/2020   GERD (gastroesophageal reflux disease) 11/18/2020   Type 2 diabetes mellitus with stage 3b chronic kidney disease, without long-term current use of insulin ([place]) 07/12/2020   Mixed hyperlipidemia 07/12/2020   Essential hypertension, benign 07/12/2020   Iron deficiency anemia    Loss of weight 04/09/2020   Dyspepsia 01/28/2020   Constipation 01/28/2020   Lower abdominal pain 01/28/2020   Normocytic anemia 01/28/2020   Past Medical History:  Diagnosis Date   Acid reflux disease    Anemia    Anxiety    Asthma    Breast  cancer (HCC)    left side   Chronic back pain    Chronic kidney disease    Diabetes mellitus without complication ([place])    Diverticulitis    Hypertension    Lumbosacral spondylosis    Per previous PCP records   Sleep apnea    Thyroid disease     Family History  Problem Relation Age of Onset   Diabetes Mother    Thyroid disease Mother    Colon cancer Neg Hx     Past Surgical History:  Procedure Laterality Date   ABDOMINAL HYSTERECTOMY     BIOPSY  02/19/2020   Procedure: BIOPSY;  Surgeon: Eloise Harman, DO;  Location: [facility];  Service: Endoscopy;;   BREAST LUMPECTOMY Left 2003   BREAST SURGERY     COLONOSCOPY WITH PROPOFOL N/A 02/19/2020   non-bleeding internal hemorrhoids, many small-mouthed diverticula in entire colon.   ESOPHAGOGASTRODUODENOSCOPY (EGD) WITH PROPOFOL N/A 02/19/2020    benign-appearing esophageal stenosis s/p dilation, gastritis s/p biopsy, normal duodenum. Negative H.pylori, negative duodenal biopsy.    GIVENS CAPSULE STUDY N/A 06/14/2020   Procedure: GIVENS CAPSULE STUDY;  Surgeon: Eloise Harman, DO;  Location: [facility];  Service: Endoscopy;  Laterality: N/A;  7:30am   THYROIDECTOMY     tummy tuck     at time of breast surgery 2003   Social History   Occupational History  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 [place]  Tobacco Use   Smoking status: Never   Smokeless tobacco: Never  Vaping Use   Vaping Use: Never used  Substance and Sexual Activity   Alcohol use: Not Currently   Drug use: Not Currently   Sexual activity: Not on file   Exam Pleasant female in no acute distress.  Lumbar wound looks good.  Staples removed and Steri-Strips applied.  No drainage or signs of infection.

## 2021-04-06 ENCOUNTER — Telehealth: Payer: Self-pay | Admitting: Specialist

## 2021-04-06 NOTE — Telephone Encounter (Signed)
Patient's daughter [name] called asked if patient can be referred to another facility for rehab. [name] said her mother is not getting the care she is suppose to be getting. [name] said patient was in the bed for the first 2 weeks and now she just sitting in a wheelchair. The number to contact [name] is [phone]

## 2021-04-08 ENCOUNTER — Telehealth: Payer: Self-pay | Admitting: Specialist

## 2021-04-08 NOTE — Telephone Encounter (Signed)
Pt's daughter [name] called requesting a call back concerning referral for home health pt be sent. Please call pt at [phone].

## 2021-04-11 ENCOUNTER — Encounter (HOSPITAL_COMMUNITY): Payer: Self-pay | Admitting: Emergency Medicine

## 2021-04-11 ENCOUNTER — Emergency Department (HOSPITAL_COMMUNITY): Payer: Medicare Other

## 2021-04-11 ENCOUNTER — Inpatient Hospital Stay (HOSPITAL_COMMUNITY)
Admission: EM | Admit: 2021-04-11 | Discharge: 2021-04-15 | DRG: 683 | Disposition: A | Discharge status: Skilled Nursing Facility | Payer: Medicare Other | Attending: Family Medicine | Admitting: Family Medicine

## 2021-04-11 ENCOUNTER — Ambulatory Visit (HOSPITAL_COMMUNITY): Payer: Medicare Other | Attending: Internal Medicine

## 2021-04-11 ENCOUNTER — Other Ambulatory Visit: Payer: Self-pay

## 2021-04-11 DIAGNOSIS — R339 Retention of urine, unspecified: Secondary | ICD-10-CM | POA: Diagnosis present

## 2021-04-11 DIAGNOSIS — Z888 Allergy status to other drugs, medicaments and biological substances status: Secondary | ICD-10-CM

## 2021-04-11 DIAGNOSIS — E1122 Type 2 diabetes mellitus with diabetic chronic kidney disease: Secondary | ICD-10-CM | POA: Diagnosis present

## 2021-04-11 DIAGNOSIS — Z8349 Family history of other endocrine, nutritional and metabolic diseases: Secondary | ICD-10-CM

## 2021-04-11 DIAGNOSIS — N390 Urinary tract infection, site not specified: Secondary | ICD-10-CM | POA: Diagnosis present

## 2021-04-11 DIAGNOSIS — Z7982 Long term (current) use of aspirin: Secondary | ICD-10-CM

## 2021-04-11 DIAGNOSIS — K5900 Constipation, unspecified: Secondary | ICD-10-CM | POA: Diagnosis present

## 2021-04-11 DIAGNOSIS — Z880 Allergy status to penicillin: Secondary | ICD-10-CM

## 2021-04-11 DIAGNOSIS — E860 Dehydration: Secondary | ICD-10-CM | POA: Diagnosis not present

## 2021-04-11 DIAGNOSIS — Z79899 Other long term (current) drug therapy: Secondary | ICD-10-CM

## 2021-04-11 DIAGNOSIS — E782 Mixed hyperlipidemia: Secondary | ICD-10-CM | POA: Diagnosis present

## 2021-04-11 DIAGNOSIS — Z7984 Long term (current) use of oral hypoglycemic drugs: Secondary | ICD-10-CM

## 2021-04-11 DIAGNOSIS — R338 Other retention of urine: Secondary | ICD-10-CM

## 2021-04-11 DIAGNOSIS — I129 Hypertensive chronic kidney disease with stage 1 through stage 4 chronic kidney disease, or unspecified chronic kidney disease: Secondary | ICD-10-CM | POA: Diagnosis present

## 2021-04-11 DIAGNOSIS — Z7985 Long-term (current) use of injectable non-insulin antidiabetic drugs: Secondary | ICD-10-CM

## 2021-04-11 DIAGNOSIS — R54 Age-related physical debility: Secondary | ICD-10-CM | POA: Diagnosis present

## 2021-04-11 DIAGNOSIS — I959 Hypotension, unspecified: Secondary | ICD-10-CM | POA: Diagnosis present

## 2021-04-11 DIAGNOSIS — G4730 Sleep apnea, unspecified: Secondary | ICD-10-CM | POA: Diagnosis present

## 2021-04-11 DIAGNOSIS — E871 Hypo-osmolality and hyponatremia: Secondary | ICD-10-CM | POA: Diagnosis present

## 2021-04-11 DIAGNOSIS — Z6831 Body mass index (BMI) 31.0-31.9, adult: Secondary | ICD-10-CM

## 2021-04-11 DIAGNOSIS — N1832 Chronic kidney disease, stage 3b: Secondary | ICD-10-CM | POA: Diagnosis present

## 2021-04-11 DIAGNOSIS — N179 Acute kidney failure, unspecified: Principal | ICD-10-CM | POA: Diagnosis present

## 2021-04-11 DIAGNOSIS — I10 Essential (primary) hypertension: Secondary | ICD-10-CM | POA: Diagnosis present

## 2021-04-11 DIAGNOSIS — Z833 Family history of diabetes mellitus: Secondary | ICD-10-CM

## 2021-04-11 DIAGNOSIS — Z794 Long term (current) use of insulin: Secondary | ICD-10-CM

## 2021-04-11 DIAGNOSIS — Z885 Allergy status to narcotic agent status: Secondary | ICD-10-CM

## 2021-04-11 DIAGNOSIS — Z981 Arthrodesis status: Secondary | ICD-10-CM

## 2021-04-11 DIAGNOSIS — E669 Obesity, unspecified: Secondary | ICD-10-CM | POA: Diagnosis present

## 2021-04-11 DIAGNOSIS — K219 Gastro-esophageal reflux disease without esophagitis: Secondary | ICD-10-CM | POA: Diagnosis present

## 2021-04-11 DIAGNOSIS — Z853 Personal history of malignant neoplasm of breast: Secondary | ICD-10-CM

## 2021-04-11 DIAGNOSIS — N189 Chronic kidney disease, unspecified: Secondary | ICD-10-CM | POA: Diagnosis present

## 2021-04-11 DIAGNOSIS — Z20822 Contact with and (suspected) exposure to covid-19: Secondary | ICD-10-CM | POA: Diagnosis present

## 2021-04-11 LAB — COMPREHENSIVE METABOLIC PANEL
ALT: 20 U/L (ref 0–44)
AST: 27 U/L (ref 15–41)
Albumin: 3.7 g/dL (ref 3.5–5.0)
Alkaline Phosphatase: 83 U/L (ref 38–126)
Anion gap: 10 (ref 5–15)
BUN: 85 mg/dL — ABNORMAL HIGH (ref 8–23)
CO2: 20 mmol/L — ABNORMAL LOW (ref 22–32)
Calcium: 9 mg/dL (ref 8.9–10.3)
Chloride: 99 mmol/L (ref 98–111)
Creatinine, Ser: 4.07 mg/dL — ABNORMAL HIGH (ref 0.44–1.00)
GFR, Estimated: 11 mL/min — ABNORMAL LOW (ref >60)
Glucose, Bld: 198 mg/dL — ABNORMAL HIGH (ref 70–99)
Potassium: 5.2 mmol/L — ABNORMAL HIGH (ref 3.5–5.1)
Sodium: 129 mmol/L — ABNORMAL LOW (ref 135–145)
Total Bilirubin: 0.5 mg/dL (ref 0.3–1.2)
Total Protein: 9.2 g/dL — ABNORMAL HIGH (ref 6.5–8.1)

## 2021-04-11 LAB — CBC WITH DIFFERENTIAL/PLATELET
Abs Immature Granulocytes: 0.03 10*3/uL (ref 0.00–0.07)
Basophils Absolute: 0.1 10*3/uL (ref 0.0–0.1)
Basophils Relative: 1 %
Eosinophils Absolute: 0.1 10*3/uL (ref 0.0–0.5)
Eosinophils Relative: 1 %
HCT: 36.2 % (ref 36.0–46.0)
Hemoglobin: 11.5 g/dL — ABNORMAL LOW (ref 12.0–15.0)
Immature Granulocytes: 0 %
Lymphocytes Relative: 34 %
Lymphs Abs: 3.3 10*3/uL (ref 0.7–4.0)
MCH: 28.3 pg (ref 26.0–34.0)
MCHC: 31.8 g/dL (ref 30.0–36.0)
MCV: 88.9 fL (ref 80.0–100.0)
Monocytes Absolute: 0.6 10*3/uL (ref 0.1–1.0)
Monocytes Relative: 6 %
Neutro Abs: 5.5 10*3/uL (ref 1.7–7.7)
Neutrophils Relative %: 58 %
Platelets: 408 10*3/uL — ABNORMAL HIGH (ref 150–400)
RBC: 4.07 MIL/uL (ref 3.87–5.11)
RDW: 13.2 % (ref 11.5–15.5)
WBC: 9.5 10*3/uL (ref 4.0–10.5)
nRBC: 0 % (ref 0.0–0.2)

## 2021-04-11 LAB — LACTIC ACID, PLASMA: Lactic Acid, Venous: 1.8 mmol/L (ref 0.5–1.9)

## 2021-04-11 LAB — CBG MONITORING, ED: Glucose-Capillary: 180 mg/dL — ABNORMAL HIGH (ref 70–99)

## 2021-04-11 LAB — TROPONIN I (HIGH SENSITIVITY): Troponin I (High Sensitivity): 4 ng/L (ref <18)

## 2021-04-11 MED ORDER — SODIUM CHLORIDE 0.9 % IV BOLUS
2000.0000 mL | Freq: Once | INTRAVENOUS | Status: AC
  Administered 2021-04-11: 2000 mL via INTRAVENOUS

## 2021-04-11 MED ORDER — SODIUM CHLORIDE 0.9 % IV SOLN
2.0000 g | Freq: Once | INTRAVENOUS | Status: AC
  Administered 2021-04-11: 2 g via INTRAVENOUS
  Filled 2021-04-11: qty 20

## 2021-04-11 MED ORDER — LACTATED RINGERS IV SOLN: INTRAVENOUS | Status: DC

## 2021-04-11 NOTE — ED Triage Notes (Signed)
Per [facility] pt was d/c'ed tonight at 8307 from [place] post back surgery (8/3)/rehab; family called EMS after pt had a fall tonight while family assisted pt to transfer from her w/c; family called EMS requesting transport back to [place] as they do not feel they can care for her; EMS advised they are unable to take pt back to [place] so family requested she be brought to the ER for eval of back and neck pain 6/10; last had Percocet at 1800 when d/c'ed from [facility]

## 2021-04-11 NOTE — ED Provider Notes (Signed)
Patient had back surgery a few weeks ago.  She was discharged to the nursing home on September 16.  She came home in the last 24 hours.  But was very weak in her legs and she was not ambulating there.  At home she was assisted up and then fell onto her relatives.  She is awake lethargic very weak.  Sepsis protocol has been started   Milton Ferguson, MD 04/11/21 2226

## 2021-04-11 NOTE — Sepsis Progress Note (Signed)
Following for sepsis monitoring ?

## 2021-04-11 NOTE — Telephone Encounter (Signed)
Pending review with Dr. Louanne Skye---

## 2021-04-12 ENCOUNTER — Encounter (HOSPITAL_COMMUNITY): Payer: Self-pay | Admitting: Internal Medicine

## 2021-04-12 DIAGNOSIS — K5909 Other constipation: Secondary | ICD-10-CM | POA: Diagnosis not present

## 2021-04-12 DIAGNOSIS — E782 Mixed hyperlipidemia: Secondary | ICD-10-CM

## 2021-04-12 DIAGNOSIS — Z8349 Family history of other endocrine, nutritional and metabolic diseases: Secondary | ICD-10-CM | POA: Diagnosis not present

## 2021-04-12 DIAGNOSIS — Z853 Personal history of malignant neoplasm of breast: Secondary | ICD-10-CM | POA: Diagnosis not present

## 2021-04-12 DIAGNOSIS — K219 Gastro-esophageal reflux disease without esophagitis: Secondary | ICD-10-CM | POA: Diagnosis present

## 2021-04-12 DIAGNOSIS — I10 Essential (primary) hypertension: Secondary | ICD-10-CM

## 2021-04-12 DIAGNOSIS — K5900 Constipation, unspecified: Secondary | ICD-10-CM | POA: Diagnosis present

## 2021-04-12 DIAGNOSIS — N1832 Chronic kidney disease, stage 3b: Secondary | ICD-10-CM

## 2021-04-12 DIAGNOSIS — E669 Obesity, unspecified: Secondary | ICD-10-CM | POA: Diagnosis present

## 2021-04-12 DIAGNOSIS — Z981 Arthrodesis status: Secondary | ICD-10-CM | POA: Diagnosis not present

## 2021-04-12 DIAGNOSIS — Z7982 Long term (current) use of aspirin: Secondary | ICD-10-CM | POA: Diagnosis not present

## 2021-04-12 DIAGNOSIS — N189 Chronic kidney disease, unspecified: Secondary | ICD-10-CM | POA: Diagnosis present

## 2021-04-12 DIAGNOSIS — E871 Hypo-osmolality and hyponatremia: Secondary | ICD-10-CM | POA: Diagnosis present

## 2021-04-12 DIAGNOSIS — Z20822 Contact with and (suspected) exposure to covid-19: Secondary | ICD-10-CM | POA: Diagnosis present

## 2021-04-12 DIAGNOSIS — G4730 Sleep apnea, unspecified: Secondary | ICD-10-CM | POA: Diagnosis present

## 2021-04-12 DIAGNOSIS — N179 Acute kidney failure, unspecified: Secondary | ICD-10-CM | POA: Diagnosis not present

## 2021-04-12 DIAGNOSIS — E1122 Type 2 diabetes mellitus with diabetic chronic kidney disease: Secondary | ICD-10-CM | POA: Diagnosis present

## 2021-04-12 DIAGNOSIS — N390 Urinary tract infection, site not specified: Secondary | ICD-10-CM | POA: Diagnosis present

## 2021-04-12 DIAGNOSIS — Z794 Long term (current) use of insulin: Secondary | ICD-10-CM | POA: Diagnosis not present

## 2021-04-12 DIAGNOSIS — Z6831 Body mass index (BMI) 31.0-31.9, adult: Secondary | ICD-10-CM | POA: Diagnosis not present

## 2021-04-12 DIAGNOSIS — R54 Age-related physical debility: Secondary | ICD-10-CM | POA: Diagnosis present

## 2021-04-12 DIAGNOSIS — Z7985 Long-term (current) use of injectable non-insulin antidiabetic drugs: Secondary | ICD-10-CM | POA: Diagnosis not present

## 2021-04-12 DIAGNOSIS — Z79899 Other long term (current) drug therapy: Secondary | ICD-10-CM | POA: Diagnosis not present

## 2021-04-12 DIAGNOSIS — Z7984 Long term (current) use of oral hypoglycemic drugs: Secondary | ICD-10-CM | POA: Diagnosis not present

## 2021-04-12 DIAGNOSIS — I129 Hypertensive chronic kidney disease with stage 1 through stage 4 chronic kidney disease, or unspecified chronic kidney disease: Secondary | ICD-10-CM | POA: Diagnosis present

## 2021-04-12 DIAGNOSIS — R338 Other retention of urine: Secondary | ICD-10-CM | POA: Diagnosis not present

## 2021-04-12 DIAGNOSIS — E860 Dehydration: Secondary | ICD-10-CM

## 2021-04-12 DIAGNOSIS — I959 Hypotension, unspecified: Secondary | ICD-10-CM | POA: Diagnosis present

## 2021-04-12 LAB — LACTIC ACID, PLASMA: Lactic Acid, Venous: 1.5 mmol/L (ref 0.5–1.9)

## 2021-04-12 LAB — APTT: aPTT: 35 seconds (ref 24–36)

## 2021-04-12 LAB — URINALYSIS, ROUTINE W REFLEX MICROSCOPIC
Bilirubin Urine: NEGATIVE
Glucose, UA: NEGATIVE mg/dL
Ketones, ur: NEGATIVE mg/dL
Nitrite: POSITIVE — AB
Protein, ur: 30 mg/dL — AB
RBC / HPF: >50 RBC/hpf — ABNORMAL HIGH (ref 0–5)
Specific Gravity, Urine: 1.01 (ref 1.005–1.030)
WBC, UA: >50 WBC/hpf — ABNORMAL HIGH (ref 0–5)
pH: 6 (ref 5.0–8.0)

## 2021-04-12 LAB — PROTIME-INR
INR: 1.3 — ABNORMAL HIGH (ref 0.8–1.2)
Prothrombin Time: 15.7 seconds — ABNORMAL HIGH (ref 11.4–15.2)

## 2021-04-12 LAB — TROPONIN I (HIGH SENSITIVITY): Troponin I (High Sensitivity): 3 ng/L (ref <18)

## 2021-04-12 LAB — CBG MONITORING, ED: Glucose-Capillary: 112 mg/dL — ABNORMAL HIGH (ref 70–99)

## 2021-04-12 LAB — RESP PANEL BY RT-PCR (FLU A&B, COVID) ARPGX2
Influenza A by PCR: NEGATIVE
Influenza B by PCR: NEGATIVE
SARS Coronavirus 2 by RT PCR: NEGATIVE

## 2021-04-12 LAB — GLUCOSE, CAPILLARY: Glucose-Capillary: 108 mg/dL — ABNORMAL HIGH (ref 70–99)

## 2021-04-12 MED ORDER — ROSUVASTATIN CALCIUM 10 MG PO TABS
10.0000 mg | ORAL_TABLET | Freq: Every day | ORAL | Status: DC
  Administered 2021-04-12 – 2021-04-14 (×3): 10 mg via ORAL
  Filled 2021-04-12 (×3): qty 1

## 2021-04-12 MED ORDER — AMITRIPTYLINE HCL 25 MG PO TABS
25.0000 mg | ORAL_TABLET | Freq: Every day | ORAL | Status: DC
  Administered 2021-04-12 – 2021-04-14 (×3): 25 mg via ORAL
  Filled 2021-04-12 (×3): qty 1

## 2021-04-12 MED ORDER — FLUTICASONE PROPIONATE 50 MCG/ACT NA SUSP: 2.0000 | Freq: Every day | NASAL | Status: DC | PRN

## 2021-04-12 MED ORDER — ONDANSETRON HCL 4 MG PO TABS: 4.0000 mg | ORAL_TABLET | Freq: Four times a day (QID) | ORAL | Status: DC | PRN

## 2021-04-12 MED ORDER — SODIUM CHLORIDE 0.9 % IV SOLN: INTRAVENOUS | Status: DC

## 2021-04-12 MED ORDER — PANTOPRAZOLE SODIUM 40 MG PO TBEC
40.0000 mg | DELAYED_RELEASE_TABLET | Freq: Every day | ORAL | Status: DC
  Administered 2021-04-12 – 2021-04-15 (×4): 40 mg via ORAL
  Filled 2021-04-12 (×4): qty 1

## 2021-04-12 MED ORDER — LINACLOTIDE 145 MCG PO CAPS: 145.0000 ug | ORAL_CAPSULE | Freq: Every day | ORAL | Status: DC

## 2021-04-12 MED ORDER — BECLOMETHASONE DIPROPIONATE 80 MCG/ACT IN AERS: 1.0000 | INHALATION_SPRAY | Freq: Two times a day (BID) | RESPIRATORY_TRACT | Status: DC

## 2021-04-12 MED ORDER — HEPARIN SODIUM (PORCINE) 5000 UNIT/ML IJ SOLN
5000.0000 [IU] | Freq: Three times a day (TID) | INTRAMUSCULAR | Status: DC
  Administered 2021-04-12 – 2021-04-14 (×7): 5000 [IU] via SUBCUTANEOUS
  Filled 2021-04-12 (×8): qty 1

## 2021-04-12 MED ORDER — ACETAMINOPHEN 325 MG PO TABS
650.0000 mg | ORAL_TABLET | Freq: Four times a day (QID) | ORAL | Status: DC | PRN
  Administered 2021-04-13 – 2021-04-15 (×4): 650 mg via ORAL
  Filled 2021-04-12 (×4): qty 2

## 2021-04-12 MED ORDER — METHOCARBAMOL 500 MG PO TABS
500.0000 mg | ORAL_TABLET | Freq: Four times a day (QID) | ORAL | Status: DC | PRN
  Administered 2021-04-12 – 2021-04-14 (×5): 500 mg via ORAL
  Filled 2021-04-12 (×5): qty 1

## 2021-04-12 MED ORDER — BUDESONIDE 0.25 MG/2ML IN SUSP
0.2500 mg | Freq: Two times a day (BID) | RESPIRATORY_TRACT | Status: DC
  Administered 2021-04-12 – 2021-04-15 (×6): 0.25 mg via RESPIRATORY_TRACT
  Filled 2021-04-12 (×6): qty 2

## 2021-04-12 MED ORDER — ENSURE ENLIVE PO LIQD
237.0000 mL | Freq: Two times a day (BID) | ORAL | Status: DC
  Administered 2021-04-12 – 2021-04-15 (×5): 237 mL via ORAL
  Filled 2021-04-12 (×3): qty 237

## 2021-04-12 MED ORDER — SODIUM CHLORIDE 0.9 % IV SOLN
2.0000 g | INTRAVENOUS | Status: DC
  Administered 2021-04-12: 2 g via INTRAVENOUS
  Filled 2021-04-12: qty 20

## 2021-04-12 MED ORDER — POLYETHYLENE GLYCOL 3350 17 G PO PACK: 17.0000 g | PACK | Freq: Every day | ORAL | Status: DC | PRN

## 2021-04-12 MED ORDER — ONDANSETRON HCL 4 MG/2ML IJ SOLN: 4.0000 mg | Freq: Four times a day (QID) | INTRAMUSCULAR | Status: DC | PRN

## 2021-04-12 MED ORDER — SUCRALFATE 1 G PO TABS
1.0000 g | ORAL_TABLET | Freq: Two times a day (BID) | ORAL | Status: DC
  Administered 2021-04-12 – 2021-04-15 (×6): 1 g via ORAL
  Filled 2021-04-12 (×6): qty 1

## 2021-04-12 MED ORDER — ACETAMINOPHEN 650 MG RE SUPP
650.0000 mg | Freq: Four times a day (QID) | RECTAL | Status: DC | PRN
  Filled 2021-04-12: qty 1

## 2021-04-12 MED ORDER — GABAPENTIN 300 MG PO CAPS
300.0000 mg | ORAL_CAPSULE | Freq: Every day | ORAL | Status: DC
  Administered 2021-04-12 – 2021-04-14 (×3): 300 mg via ORAL
  Filled 2021-04-12 (×3): qty 1

## 2021-04-12 NOTE — ED Notes (Signed)
Ph call from daughter, [name], update given on pt status; daughter reports she is concerned about pt needing additional rehab and does not feel pt received adequate rehabilitation services while at [facility]; daughter requests SW consult prior to discharge for additional rehab placement

## 2021-04-12 NOTE — ED Notes (Signed)
Secure chat sent to Hospitalist to advice on family request for SW consult

## 2021-04-12 NOTE — H&P (Signed)
History and Physical  Stephanie Moreno DIY:[id number] DOB: 07/01/1946 DOA: 04/11/2021   PCP: Margretta Sidle, MD   Patient coming from: Home  Chief Complaint: generalized weakness  HPI:  Stephanie Moreno is a 75 y.o. female with medical history of CKD stage III, diabetes mellitus type 2, hyperlipidemia, hypertension, iron deficiency, GERD presenting with generalized weakness.  The patient had recent back surgery on 03/18/2021 when she underwent a laminectomy L4-5 and L5-S1 with L5-S1 fusion.  The patient was discharged to [facility].  She returned home on 04/11/2021.  Apparently, the family was trying to get the patient up when she was so weak she slid and fell onto the ground.  As result, the patient was brought to the hospital for further evaluation.  The patient is difficult historian.  However she was able to answer simple questions.  She denies any fevers, chills, headache, neck pain, chest pain, shortness breath, nausea, vomiting, diarrhea, Donnell pain, dysuria, hematuria.  ED In the emergency department, the patient was afebrile with soft blood pressures.  Oxygen saturation was 100% room air.  She was given 2 L bolus NS and started on ceftriaxone. UA showed >50 WBC.  BMP showed sodium 139, potassium 5.2, serum creatinine 4.07.  LFTs were unremarkable.  WBC 9.5, hemoglobin 1.5, platelets 408  Lactic acid peaked at 1.8.   Assessment/Plan: Acute on chronic renal failure--CKD3b -Secondary to volume depletion in the setting of furosemide and losartan usage -Holding losartan -Holding furosemide -Continue IV fluids -Renal ultrasound  Pyuria -Concerned about UTI -Continue ceftriaxone pending urine culture data -UA>50 WBC  Diabetes mellitus type 2 -Start low dose Semglee -NovoLog sliding scale -03/15/2021 hemoglobin A1c 7.5 -Holding glipizide and Trulicity  Essential hypertension -Holding metoprolol, losartan, amlodipine, verapamil secondary to soft blood pressure -unclear why she is on  2 calcium channel blockers  Hyperlipidemia -Continue statin  GERD -Continue PPI  Status postlaminectomy and L5-S1 with confusion -Judicious opioids -PDMP reviewed--pt received tramadol 03/25/21     Past Medical History:  Diagnosis Date   Acid reflux disease    Anemia    Anxiety    Asthma    Breast cancer (HCC)    left side   Chronic back pain    Chronic kidney disease    Diabetes mellitus without complication ([place])    Diverticulitis    Hypertension    Lumbosacral spondylosis    Per previous PCP records   Sleep apnea    Thyroid disease    Past Surgical History:  Procedure Laterality Date   ABDOMINAL HYSTERECTOMY     BACK SURGERY     BIOPSY  02/19/2020   Procedure: BIOPSY;  Surgeon: Eloise Harman, DO;  Location: [facility];  Service: Endoscopy;;   BREAST LUMPECTOMY Left 2003   BREAST SURGERY     COLONOSCOPY WITH PROPOFOL N/A 02/19/2020   non-bleeding internal hemorrhoids, many small-mouthed diverticula in entire colon.   ESOPHAGOGASTRODUODENOSCOPY (EGD) WITH PROPOFOL N/A 02/19/2020    benign-appearing esophageal stenosis s/p dilation, gastritis s/p biopsy, normal duodenum. Negative H.pylori, negative duodenal biopsy.    GIVENS CAPSULE STUDY N/A 06/14/2020   Procedure: GIVENS CAPSULE STUDY;  Surgeon: Eloise Harman, DO;  Location: [facility];  Service: Endoscopy;  Laterality: N/A;  7:30am   THYROIDECTOMY     tummy tuck     at time of breast surgery 2003   Social History:  reports that she has never smoked. She has never used smokeless tobacco. She reports that she does  not currently use alcohol. She reports that she does not currently use drugs.   Family History  Problem Relation Age of Onset   Diabetes Mother    Thyroid disease Mother    Colon cancer Neg Hx      Allergies  Allergen Reactions   Codeine Nausea And Vomiting   Hydrochlorothiazide Other (See Comments)   Lisinopril Nausea And Vomiting   Oxybutynin Other (See Comments)    Penicillins Hives     Prior to Admission medications   Medication Sig Start Date End Date Taking? Authorizing Provider  amitriptyline (ELAVIL) 25 MG tablet Take 25 mg by mouth at bedtime. 10/20/20   [provider]  amLODipine (NORVASC) 5 MG tablet Take 5 mg by mouth daily. 05/18/20   [provider]  aspirin EC 81 MG tablet Take 81 mg by mouth daily. Swallow whole. Patient not taking: No sig reported    [provider]  beclomethasone (QVAR) 80 MCG/ACT inhaler Inhale 1 puff into the lungs 2 (two) times daily as needed (shortness of breath).    [provider]  Blood Glucose Monitoring Suppl (ACCU-CHEK GUIDE ME) w/Device KIT 1 Piece by Does not apply route as directed. 04/15/20   Cassandria Anger, MD  calcium-vitamin D (OSCAL WITH D) 500-200 MG-UNIT TABS tablet Take 1 tablet by mouth in the morning, at noon, and at bedtime.    [provider]  cyclobenzaprine (FLEXERIL) 10 MG tablet Take 10 mg by mouth 3 (three) times daily as needed for muscle spasms. 06/30/20   [provider]  docusate sodium (COLACE) 100 MG capsule TAKE 1 CAPSULE BY MOUTH TWICE A DAY Patient not taking: No sig reported 02/10/21   Erenest Rasher, PA-C  Dulaglutide (TRULICITY) 3 MB/5.5HR SOPN Inject 3 mg as directed once a week. 03/17/21   Brita Romp, NP  fluticasone (FLONASE) 50 MCG/ACT nasal spray Place 2 sprays into both nostrils daily as needed for allergies or rhinitis.    [provider]  furosemide (LASIX) 40 MG tablet Take 40-80 mg by mouth See admin instructions. Takes 40 mg every other day. On alternate days take 80 mg    [provider]  gabapentin (NEURONTIN) 300 MG capsule Take 1 capsule (300 mg total) by mouth at bedtime. 03/19/21   Jessy Oto, MD  glipiZIDE (GLUCOTROL XL) 5 MG 24 hr tablet TAKE 1 TABLET BY MOUTH EVERY DAY WITH BREAKFAST 02/07/21   Brita Romp, NP  GLUCOSAMINE-CHONDROITIN PO Take 1 tablet by mouth daily.     [provider]  glucose blood test strip 1 each by Other route 3 (three) times daily. Dx: E11.65 02/08/21   Brita Romp, NP  HYDROcodone-acetaminophen (NORCO) 10-325 MG tablet Take 1 tablet by mouth every 4 (four) hours as needed for moderate pain ((score 4 to 6)). 03/19/21   Jessy Oto, MD  insulin degludec (TRESIBA FLEXTOUCH) 100 UNIT/ML FlexTouch Pen Inject 40 Units into the skin daily. Patient taking differently: Inject 40 Units into the skin at bedtime. 01/26/21   Brita Romp, NP  Insulin Pen Needle (B-D ULTRAFINE III SHORT PEN) 31G X 8 MM MISC Use as directed to inject insulin daily 03/17/21   Brita Romp, NP  Lancets [facility] DELICA PLUS [id number]) MISC Use to check glucose twice daily 01/26/21   Brita Romp, NP  lidocaine (LIDODERM) 5 % Place 2 patches onto the skin daily as needed (pain). Remove & Discard patch within 12 hours or as directed  by MD. Applied to lower back/shoulders/neck for pain    [provider]  linaclotide Rolan Lipa) 145 MCG CAPS capsule Take 1 capsule (145 mcg total) by mouth daily before breakfast. 11/18/20   Annitta Needs, NP  losartan (COZAAR) 100 MG tablet Take 100 mg by mouth daily.    [provider]  magnesium oxide (MAG-OX) 400 MG tablet Take 400 mg by mouth daily. 01/17/20   [provider]  meclizine (ANTIVERT) 25 MG tablet Take 25 mg by mouth 3 (three) times daily as needed for dizziness.    [provider]  methocarbamol (ROBAXIN) 500 MG tablet Take 1 tablet (500 mg total) by mouth every 6 (six) hours as needed for muscle spasms. 03/19/21   Jessy Oto, MD  metoprolol tartrate (LOPRESSOR) 50 MG tablet Take 50 mg by mouth 2 (two) times daily. 01/17/20   [provider]  Naphazoline HCl (CLEAR EYES OP) Place 1 drop into both eyes daily as needed (dry eyes).    [provider]  pantoprazole (PROTONIX) 40 MG tablet Take 1 tablet (40 mg total) by mouth daily. 30 minutes before  breakfast 11/18/20   Annitta Needs, NP  phenazopyridine (PYRIDIUM) 200 MG tablet Take 1 tablet (200 mg total) by mouth 3 (three) times daily. Patient not taking: Reported on 03/08/2021 09/17/20   [place], [place], PA-C  polyethylene glycol (MIRALAX / GLYCOLAX) 17 g packet Take 17 g by mouth daily. 03/20/21   Jessy Oto, MD  polyethylene glycol (MIRALAX / GLYCOLAX) 17 g packet Take 17 g by mouth daily as needed for moderate constipation. 03/19/21   Jessy Oto, MD  rosuvastatin (CRESTOR) 10 MG tablet Take 10 mg by mouth at bedtime.     [provider]  sucralfate (CARAFATE) 1 g tablet Take 1 g by mouth in the morning, at noon, and at bedtime.    [provider]  verapamil (CALAN-SR) 180 MG CR tablet Take 180 mg by mouth daily. 10/06/20   [provider]  Vibegron (GEMTESA) 75 MG TABS Take 75 mg by mouth daily.    [provider]    Review of Systems:  Constitutional:  No weight loss, night sweats, Fevers, chills, fatigue.  Head&Eyes: No headache.  No vision loss.  No eye pain or scotoma ENT:  No Difficulty swallowing,Tooth/dental problems,Sore throat,  No ear ache, post nasal drip,  Cardio-vascular:  No chest pain, Orthopnea, PND, swelling in lower extremities,  dizziness, palpitations  GI:  No  abdominal pain, nausea, vomiting, diarrhea, loss of appetite, hematochezia, melena, heartburn, indigestion, Resp:  No shortness of breath with exertion or at rest. No cough. No coughing up of blood .No wheezing.No chest wall deformity  Skin:  no rash or lesions.  GU:  no dysuria, change in color of urine, no urgency or frequency. No flank pain.  Musculoskeletal:  No joint pain or swelling. No decreased range of motion. No back pain.  Psych:  No change in mood or affect. No depression or anxiety. Neurologic: No headache, no dysesthesia, no focal weakness, no vision loss. No syncope  Physical Exam: Vitals:   04/12/21 0500 04/12/21 0530 04/12/21 0600  04/12/21 0630  BP: 121/74 133/70 93/63 124/63  Pulse: 80 82 80 79  Resp: _0 Temp:      TempSrc:      SpO2: 99% 98% 100% 100%  Weight:      Height:       General:  A&O x 3,  NAD, nontoxic, pleasant/cooperative Head/Eye: No conjunctival hemorrhage, no icterus, [place]/AT, No nystagmus ENT:  No icterus,  No thrush, good dentition, no pharyngeal exudate Neck:  No masses, no lymphadenpathy, no bruits CV:  RRR, no rub, no gallop, no S3 Lung:  CTAB, good air movement, no wheeze, no rhonchi Abdomen: soft/NT, +BS, nondistended, no peritoneal signs Ext: No cyanosis, No rashes, No petechiae, No lymphangitis, No edema Neuro: CNII-XII intact, strength 4/5 in bilateral upper and lower extremities, no dysmetria  Labs on Admission:  Basic Metabolic Panel: Recent Labs  Lab 04/11/21 2248  NA 129*  K 5.2*  CL 99  CO2 20*  GLUCOSE 198*  BUN 85*  CREATININE 4.07*  CALCIUM 9.0   Liver Function Tests: Recent Labs  Lab 04/11/21 2248  AST 27  ALT 20  ALKPHOS 83  BILITOT 0.5  PROT 9.2*  ALBUMIN 3.7   No results for input(s): LIPASE, AMYLASE in the last 168 hours. No results for input(s): AMMONIA in the last 168 hours. CBC: Recent Labs  Lab 04/11/21 2248  WBC 9.5  NEUTROABS 5.5  HGB 11.5*  HCT 36.2  MCV 88.9  PLT 408*   Coagulation Profile: Recent Labs  Lab 04/11/21 2306  INR 1.3*   Cardiac Enzymes: No results for input(s): CKTOTAL, CKMB, CKMBINDEX, TROPONINI in the last 168 hours. BNP: Invalid input(s): POCBNP CBG: Recent Labs  Lab 04/11/21 2217  GLUCAP 180*   Urine analysis:    Component Value Date/Time   COLORURINE AMBER (A) 04/12/2021 0324   APPEARANCEUR CLOUDY (A) 04/12/2021 0324   LABSPEC 1.010 04/12/2021 0324   PHURINE 6.0 04/12/2021 0324   GLUCOSEU NEGATIVE 04/12/2021 0324   HGBUR MODERATE (A) 04/12/2021 0324   BILIRUBINUR NEGATIVE 04/12/2021 0324   BILIRUBINUR negative 09/17/2020 1631   KETONESUR NEGATIVE 04/12/2021 0324   PROTEINUR 30 (A)  04/12/2021 0324   UROBILINOGEN 0.2 09/17/2020 1631   UROBILINOGEN 0.2 01/01/2007 1207   NITRITE POSITIVE (A) 04/12/2021 0324   LEUKOCYTESUR LARGE (A) 04/12/2021 0324   Sepsis Labs: _0 (procalcitonin:4,lacticidven:4) ) Recent Results (from the past 240 hour(s))  Blood Culture (routine x 2)     Status: None (Preliminary result)   Collection Time: 04/11/21 10:48 PM   Specimen: BLOOD  Result Value Ref Range Status   Specimen Description BLOOD BLOOD LEFT HAND  Final   Special Requests   Final    BOTTLES DRAWN AEROBIC AND ANAEROBIC Blood Culture adequate volume Performed at [facility], [address], [place], [place] [address]    Culture PENDING  Incomplete   Report Status PENDING  Incomplete  Blood Culture (routine x 2)     Status: None (Preliminary result)   Collection Time: 04/11/21 10:48 PM   Specimen: BLOOD  Result Value Ref Range Status   Specimen Description BLOOD RIGHT ANTECUBITAL  Final   Special Requests   Final    BOTTLES DRAWN AEROBIC AND ANAEROBIC Blood Culture adequate volume Performed at [facility], [address], [place], [place] [address]    Culture PENDING  Incomplete   Report Status PENDING  Incomplete  Resp Panel by RT-PCR (Flu A&B, Covid) Nasopharyngeal Swab     Status: None   Collection Time: 04/11/21 11:10 PM   Specimen: Nasopharyngeal Swab; Nasopharyngeal(NP) swabs in vial transport medium  Result Value Ref Range Status   SARS Coronavirus 2 by RT PCR NEGATIVE NEGATIVE Final    Comment: (NOTE) SARS-CoV-2 target nucleic acids are NOT DETECTED.  The SARS-CoV-2 RNA is generally detectable in upper respiratory specimens during the  acute phase of infection. The lowest concentration of SARS-CoV-2 viral copies this assay can detect is 138 copies/mL. A negative result does not preclude SARS-Cov-2 infection and should not be used as the sole basis for treatment or other patient management decisions. A negative result may occur with  improper specimen  collection/handling, submission of specimen other than nasopharyngeal swab, presence of viral mutation(s) within the areas targeted by this assay, and inadequate number of viral copies(<138 copies/mL). A negative result must be combined with clinical observations, patient history, and epidemiological information. The expected result is Negative.  Fact Sheet for Patients:  EntrepreneurPulse.com.au  Fact Sheet for Healthcare Providers:  IncredibleEmployment.be  This test is no t yet approved or cleared by the [place] FDA and  has been authorized for detection and/or diagnosis of SARS-CoV-2 by FDA under an Emergency Use Authorization (EUA). This EUA will remain  in effect (meaning this test can be used) for the duration of the COVID-19 declaration under Section 564(b)(1) of the Act, 21 U.S.C.section 360bbb-3(b)(1), unless the authorization is terminated  or revoked sooner.       Influenza A by PCR NEGATIVE NEGATIVE Final   Influenza B by PCR NEGATIVE NEGATIVE Final    Comment: (NOTE) The Xpert Xpress SARS-CoV-2/FLU/RSV plus assay is intended as an aid in the diagnosis of influenza from Nasopharyngeal swab specimens and should not be used as a sole basis for treatment. Nasal washings and aspirates are unacceptable for Xpert Xpress SARS-CoV-2/FLU/RSV testing.  Fact Sheet for Patients: EntrepreneurPulse.com.au  Fact Sheet for Healthcare Providers: IncredibleEmployment.be  This test is not yet approved or cleared by the [place] FDA and has been authorized for detection and/or diagnosis of SARS-CoV-2 by FDA under an Emergency Use Authorization (EUA). This EUA will remain in effect (meaning this test can be used) for the duration of the COVID-19 declaration under Section 564(b)(1) of the Act, 21 U.S.C. section 360bbb-3(b)(1), unless the authorization is terminated or revoked.  Performed at [facility], [address], [place], [place] [address]      Radiological Exams on Admission: DG Chest Port 1 View  Result Date: 04/11/2021 CLINICAL DATA:  Questionable sepsis.  Evaluate for abnormality. EXAM: PORTABLE CHEST 1 VIEW COMPARISON:  03/17/2021 FINDINGS: Shallow inspiration with elevation of the right hemidiaphragm. Heart size and pulmonary vascularity are normal. Lungs are clear. No pleural effusions. No pneumothorax. Mediastinal contours appear intact. Calcification of the aorta. IMPRESSION: Shallow inspiration with elevation of right hemidiaphragm. No active pulmonary disease. Electronically Signed   By: Lucienne Capers M.D.   On: 04/11/2021 23:06    EKG: Independently reviewed--sinus, no STT change    Time spent:60 minutes Code Status:   FULL Family Communication:  Daughter updated 10/11 Disposition Plan: expect 2 day hospitalization Consults called: none DVT Prophylaxis: [place] Heparin     Orson Eva, DO  [facility] Pager [phone]  If 7PM-7AM, please contact night-coverage [email or web address] Password [facility] 04/12/2021, 7:32 AM

## 2021-04-12 NOTE — Progress Notes (Signed)
Had asked patient about cpap usage at home; patient stated she uses it sometimes.  Asked patient about using it here, patient stated she would.  Placed patient on CPAP with nasal mask, which patient stated she uses at home.  Settings are the recommended settings from sleep study of auto-titration between 8 and 12.

## 2021-04-12 NOTE — ED Notes (Signed)
Spoke to daughter regarding pt getting admitted to the hospital

## 2021-04-12 NOTE — ED Provider Notes (Signed)
[facility] EMERGENCY DEPARTMENT Provider Note   CSN: [id number] Arrival date & time: 04/11/21  2117     History Chief Complaint  Patient presents with   Lytle Michaels    Stephanie Moreno is a 75 y.o. female.  Patient presents to emergency department after a fall.  Patient had lumbar surgery 3 weeks ago.  She has had some persistent low back pain, but no change.  Patient was discharged from the hospital to a nursing home.  Patient was discharged from the nursing home today.  When family got her home they tried to get her up and she was too weak to stand, fell to the ground.  Family has since learned that she was not receiving physical therapy at the nursing home, had not been up out of bed since she was there.  Patient reports that she feels extremely weak all over.  She has not had any fevers.  No vomiting, diarrhea.      Past Medical History:  Diagnosis Date   Acid reflux disease    Anemia    Anxiety    Asthma    Breast cancer ([place])    left side   Chronic back pain    Chronic kidney disease    Diabetes mellitus without complication ([place])    Diverticulitis    Hypertension    Lumbosacral spondylosis    Per previous PCP records   Sleep apnea    Thyroid disease     Patient Active Problem List   Diagnosis Date Noted   Spondylolisthesis, lumbar region    Spinal stenosis, lumbar region, with neurogenic claudication    Dysphagia 03/18/2021   Speech disturbance 03/18/2021   Wheezing 03/18/2021   Ileus ([place]) 03/18/2021   Fusion of spine of lumbar region 03/15/2021   Cerebrovascular disease 12/20/2020   GERD (gastroesophageal reflux disease) 11/18/2020   Type 2 diabetes mellitus with stage 3b chronic kidney disease, without long-term current use of insulin ([place]) 07/12/2020   Mixed hyperlipidemia 07/12/2020   Essential hypertension, benign 07/12/2020   Iron deficiency anemia    Loss of weight 04/09/2020   Dyspepsia 01/28/2020   Constipation 01/28/2020   Lower abdominal pain  01/28/2020   Normocytic anemia 01/28/2020    Past Surgical History:  Procedure Laterality Date   ABDOMINAL HYSTERECTOMY     BACK SURGERY     BIOPSY  02/19/2020   Procedure: BIOPSY;  Surgeon: Eloise Harman, DO;  Location: [facility];  Service: Endoscopy;;   BREAST LUMPECTOMY Left 2003   BREAST SURGERY     COLONOSCOPY WITH PROPOFOL N/A 02/19/2020   non-bleeding internal hemorrhoids, many small-mouthed diverticula in entire colon.   ESOPHAGOGASTRODUODENOSCOPY (EGD) WITH PROPOFOL N/A 02/19/2020    benign-appearing esophageal stenosis s/p dilation, gastritis s/p biopsy, normal duodenum. Negative H.pylori, negative duodenal biopsy.    GIVENS CAPSULE STUDY N/A 06/14/2020   Procedure: GIVENS CAPSULE STUDY;  Surgeon: Eloise Harman, DO;  Location: [facility];  Service: Endoscopy;  Laterality: N/A;  7:30am   THYROIDECTOMY     tummy tuck     at time of breast surgery 2003     OB History   No obstetric history on file.     Family History  Problem Relation Age of Onset   Diabetes Mother    Thyroid disease Mother    Colon cancer Neg Hx     Social History   Tobacco Use   Smoking status: Never   Smokeless tobacco: Never  Vaping Use  Vaping Use: Never used  Substance Use Topics   Alcohol use: Not Currently   Drug use: Not Currently    Home Medications Prior to Admission medications   Medication Sig Start Date End Date Taking? Authorizing Provider  amitriptyline (ELAVIL) 25 MG tablet Take 25 mg by mouth at bedtime. 10/20/20   [provider]  amLODipine (NORVASC) 5 MG tablet Take 5 mg by mouth daily. 05/18/20   [provider]  aspirin EC 81 MG tablet Take 81 mg by mouth daily. Swallow whole. Patient not taking: No sig reported    [provider]  beclomethasone (QVAR) 80 MCG/ACT inhaler Inhale 1 puff into the lungs 2 (two) times daily as needed (shortness of breath).    [provider]  Blood Glucose Monitoring Suppl (ACCU-CHEK  GUIDE ME) w/Device KIT 1 Piece by Does not apply route as directed. 04/15/20   Cassandria Anger, MD  calcium-vitamin D (OSCAL WITH D) 500-200 MG-UNIT TABS tablet Take 1 tablet by mouth in the morning, at noon, and at bedtime.    [provider]  cyclobenzaprine (FLEXERIL) 10 MG tablet Take 10 mg by mouth 3 (three) times daily as needed for muscle spasms. 06/30/20   [provider]  docusate sodium (COLACE) 100 MG capsule TAKE 1 CAPSULE BY MOUTH TWICE A DAY Patient not taking: No sig reported 02/10/21   Erenest Rasher, PA-C  Dulaglutide (TRULICITY) 3 KC/1.2XN SOPN Inject 3 mg as directed once a week. 03/17/21   Brita Romp, NP  fluticasone (FLONASE) 50 MCG/ACT nasal spray Place 2 sprays into both nostrils daily as needed for allergies or rhinitis.    [provider]  furosemide (LASIX) 40 MG tablet Take 40-80 mg by mouth See admin instructions. Takes 40 mg every other day. On alternate days take 80 mg    [provider]  gabapentin (NEURONTIN) 300 MG capsule Take 1 capsule (300 mg total) by mouth at bedtime. 03/19/21   Jessy Oto, MD  glipiZIDE (GLUCOTROL XL) 5 MG 24 hr tablet TAKE 1 TABLET BY MOUTH EVERY DAY WITH BREAKFAST 02/07/21   Brita Romp, NP  GLUCOSAMINE-CHONDROITIN PO Take 1 tablet by mouth daily.    [provider]  glucose blood test strip 1 each by Other route 3 (three) times daily. Dx: E11.65 02/08/21   Brita Romp, NP  HYDROcodone-acetaminophen (NORCO) 10-325 MG tablet Take 1 tablet by mouth every 4 (four) hours as needed for moderate pain ((score 4 to 6)). 03/19/21   Jessy Oto, MD  insulin degludec (TRESIBA FLEXTOUCH) 100 UNIT/ML FlexTouch Pen Inject 40 Units into the skin daily. Patient taking differently: Inject 40 Units into the skin at bedtime. 01/26/21   Brita Romp, NP  Insulin Pen Needle (B-D ULTRAFINE III SHORT PEN) 31G X 8 MM MISC Use as directed to inject insulin daily 03/17/21   Brita Romp, NP  Lancets [facility] DELICA PLUS [id number]) MISC Use to check glucose twice daily 01/26/21   Brita Romp, NP  lidocaine (LIDODERM) 5 % Place 2 patches onto the skin daily as needed (pain). Remove & Discard patch within 12 hours or as directed by MD. Applied to lower back/shoulders/neck for pain    [provider]  linaclotide (LINZESS) 145 MCG CAPS capsule Take 1 capsule (145 mcg total) by mouth daily before breakfast. 11/18/20   Annitta Needs, NP  losartan (COZAAR) 100 MG tablet Take 100 mg by mouth daily.    [provider]  magnesium oxide (MAG-OX) 400 MG tablet Take 400 mg by mouth daily. 01/17/20   [provider]  meclizine (ANTIVERT) 25 MG tablet Take 25 mg by mouth 3 (three) times daily as needed for dizziness.    [provider]  methocarbamol (ROBAXIN) 500 MG tablet Take 1 tablet (500 mg total) by mouth every 6 (six) hours as needed for muscle spasms. 03/19/21   Jessy Oto, MD  metoprolol tartrate (LOPRESSOR) 50 MG tablet Take 50 mg by mouth 2 (two) times daily. 01/17/20   [provider]  Naphazoline HCl (CLEAR EYES OP) Place 1 drop into both eyes daily as needed (dry eyes).    [provider]  pantoprazole (PROTONIX) 40 MG tablet Take 1 tablet (40 mg total) by mouth daily. 30 minutes before breakfast 11/18/20   Annitta Needs, NP  phenazopyridine (PYRIDIUM) 200 MG tablet Take 1 tablet (200 mg total) by mouth 3 (three) times daily. Patient not taking: Reported on 03/08/2021 09/17/20   [place], [place], PA-C  polyethylene glycol (MIRALAX / GLYCOLAX) 17 g packet Take 17 g by mouth daily. 03/20/21   Jessy Oto, MD  polyethylene glycol (MIRALAX / GLYCOLAX) 17 g packet Take 17 g by mouth daily as needed for moderate constipation. 03/19/21   Jessy Oto, MD  rosuvastatin (CRESTOR) 10 MG tablet Take 10 mg by mouth at bedtime.     [provider]  sucralfate (CARAFATE) 1 g tablet Take 1 g by mouth in the morning, at noon, and  at bedtime.    [provider]  verapamil (CALAN-SR) 180 MG CR tablet Take 180 mg by mouth daily. 10/06/20   [provider]  Vibegron (GEMTESA) 75 MG TABS Take 75 mg by mouth daily.    [provider]    Allergies    Codeine, Hydrochlorothiazide, Lisinopril, Oxybutynin, and Penicillins  Review of Systems   Review of Systems  Musculoskeletal:  Positive for back pain.  Neurological:  Positive for weakness.  All other systems reviewed and are negative.  Physical Exam Updated Vital Signs BP 104/66   Pulse 67   Temp 97.7 F (36.5 C)   Resp 14   Ht 5' 3"  (1.6 m)   Wt 82.1 kg   SpO2 97%   BMI 32.06 kg/m   Physical Exam Vitals and nursing note reviewed.  Constitutional:      General: She is not in acute distress.    Appearance: Normal appearance. She is well-developed.  HENT:     Head: Normocephalic and atraumatic.     Right Ear: Hearing normal.     Left Ear: Hearing normal.     Nose: Nose normal.  Eyes:     Conjunctiva/sclera: Conjunctivae normal.     Pupils: Pupils are equal, round, and reactive to light.  Cardiovascular:     Rate and Rhythm: Regular rhythm.     Heart sounds: S1 normal and S2 normal. No murmur heard.   No friction rub. No gallop.  Pulmonary:     Effort: Pulmonary effort is normal. No respiratory distress.     Breath sounds: Normal breath sounds.  Chest:     Chest wall: No tenderness.  Abdominal:     General: Bowel sounds are normal.     Palpations: Abdomen is soft.     Tenderness: There is no abdominal tenderness. There is no guarding or rebound. Negative signs include Murphy's sign and McBurney's sign.     Hernia: No hernia is present.  Musculoskeletal:        General: Normal range of motion.     Cervical back: Normal range of motion and neck supple.  Skin:    General: Skin is warm and dry.     Findings: No rash.     Comments: Lumbar surgical incision well-healed, no erythema, fluctuance, induration.  No signs of  infection.  Neurological:     Mental Status: She is alert and oriented to person, place, and time.     GCS: GCS eye subscore is 4. GCS verbal subscore is 5. GCS motor subscore is 6.     Cranial Nerves: No cranial nerve deficit.     Sensory: No sensory deficit.     Coordination: Coordination normal.     Comments: Equal strength bilateral lower extremities.  No foot drop.  No saddle anesthesia.  Patient able to raise both legs partially off the bed and hold them elevated.  Normal sensation throughout lower extremities.  Psychiatric:        Speech: Speech normal.        Behavior: Behavior normal.        Thought Content: Thought content normal.    ED Results / Procedures / Treatments   Labs (all labs ordered are listed, but only abnormal results are displayed) Labs Reviewed  COMPREHENSIVE METABOLIC PANEL - Abnormal; Notable for the following components:      Result Value   Sodium 129 (*)    Potassium 5.2 (*)    CO2 20 (*)    Glucose, Bld 198 (*)    BUN 85 (*)    Creatinine, Ser 4.07 (*)    Total Protein 9.2 (*)    GFR, Estimated 11 (*)    All other components within normal limits  CBC WITH DIFFERENTIAL/PLATELET - Abnormal; Notable for the following components:   Hemoglobin 11.5 (*)    Platelets 408 (*)    All other components within normal limits  CBG MONITORING, ED - Abnormal; Notable for the following components:   Glucose-Capillary 180 (*)    All other components within normal limits  RESP PANEL BY RT-PCR (FLU A&B, COVID) [id number]  CULTURE, BLOOD (ROUTINE X 2)  CULTURE, BLOOD (ROUTINE X 2)  URINE CULTURE  LACTIC ACID, PLASMA  LACTIC ACID, PLASMA  URINALYSIS, ROUTINE W REFLEX MICROSCOPIC  OCCULT BLOOD X 1 CARD TO LAB, STOOL  PROTIME-INR  APTT  I-STAT CHEM 8, ED  TROPONIN I (HIGH SENSITIVITY)  TROPONIN I (HIGH SENSITIVITY)    EKG None  Radiology DG Chest Port 1 View  Result Date: 04/11/2021 CLINICAL DATA:  Questionable sepsis.  Evaluate for abnormality. EXAM:  PORTABLE CHEST 1 VIEW COMPARISON:  03/17/2021 FINDINGS: Shallow inspiration with elevation of the right hemidiaphragm. Heart size and pulmonary vascularity are normal. Lungs are clear. No pleural effusions. No pneumothorax. Mediastinal contours appear intact. Calcification of the aorta. IMPRESSION: Shallow inspiration with elevation of right hemidiaphragm. No active pulmonary disease. Electronically Signed   By: Lucienne Capers M.D.   On: 04/11/2021 23:06    Procedures Procedures   Medications Ordered in ED Medications  lactated ringers infusion (has no administration in time range)  sodium chloride 0.9 % bolus 2,000 mL (2,000 mLs Intravenous New Bag/Given 04/11/21 2310)  cefTRIAXone (ROCEPHIN) 2 g in sodium chloride 0.9 % 100 mL IVPB (2 g Intravenous New Bag/Given 04/11/21 2309)    ED Course  I have reviewed the triage vital signs and the nursing notes.  Pertinent labs & imaging results that were available during  my care of the patient were reviewed by me and considered in my medical decision making (see chart for details).    MDM Rules/Calculators/A&P                           Patient with recent lumbar surgery, discharged to nursing home for rehab.  Patient left the nursing home today and upon arriving at home was unable to ambulate.  Patient with extreme generalized weakness.  She was noted to be hypotensive at arrival.  No fever.  Patient has a normal white blood cell count, normal lactic acid.  She does not appear septic or infectious in any way.  Creatinine is markedly elevated indicating significant acute kidney injury and severe dehydration as a cause of her hypotension.  She is receiving IV fluids with improvement of her blood pressure.  Will require hospitalization for further management of acute kidney injury.  Final Clinical Impression(s) / ED Diagnoses Final diagnoses:  Dehydration  AKI (acute kidney injury) [facility])    Rx / DC Orders ED Discharge Orders     None         Layn Kye, Gwenyth Allegra, MD 04/12/21 [phone]

## 2021-04-13 DIAGNOSIS — I10 Essential (primary) hypertension: Secondary | ICD-10-CM | POA: Diagnosis not present

## 2021-04-13 DIAGNOSIS — E782 Mixed hyperlipidemia: Secondary | ICD-10-CM | POA: Diagnosis not present

## 2021-04-13 DIAGNOSIS — N189 Chronic kidney disease, unspecified: Secondary | ICD-10-CM

## 2021-04-13 DIAGNOSIS — N179 Acute kidney failure, unspecified: Secondary | ICD-10-CM | POA: Diagnosis not present

## 2021-04-13 LAB — URINE CULTURE

## 2021-04-13 LAB — BASIC METABOLIC PANEL
Anion gap: 7 (ref 5–15)
BUN: 49 mg/dL — ABNORMAL HIGH (ref 8–23)
CO2: 20 mmol/L — ABNORMAL LOW (ref 22–32)
Calcium: 8.5 mg/dL — ABNORMAL LOW (ref 8.9–10.3)
Chloride: 113 mmol/L — ABNORMAL HIGH (ref 98–111)
Creatinine, Ser: 1.86 mg/dL — ABNORMAL HIGH (ref 0.44–1.00)
GFR, Estimated: 28 mL/min — ABNORMAL LOW (ref >60)
Glucose, Bld: 106 mg/dL — ABNORMAL HIGH (ref 70–99)
Potassium: 4 mmol/L (ref 3.5–5.1)
Sodium: 140 mmol/L (ref 135–145)

## 2021-04-13 MED ORDER — LACTATED RINGERS IV SOLN: INTRAVENOUS | Status: DC

## 2021-04-13 MED ORDER — SODIUM CHLORIDE 0.9 % IV SOLN
1.0000 g | INTRAVENOUS | Status: AC
  Administered 2021-04-13: 1 g via INTRAVENOUS
  Filled 2021-04-13: qty 10

## 2021-04-13 MED ORDER — POLYETHYLENE GLYCOL 3350 17 G PO PACK
17.0000 g | PACK | Freq: Two times a day (BID) | ORAL | Status: DC
  Administered 2021-04-13 – 2021-04-15 (×5): 17 g via ORAL
  Filled 2021-04-13 (×5): qty 1

## 2021-04-13 NOTE — Progress Notes (Addendum)
PROGRESS NOTE   CARMELLIA KREISLER  XVQ:[id number] DOB: 1946/05/31 DOA: 04/11/2021 PCP: Margretta Sidle, MD   Chief Complaint  Patient presents with   Fall   Level of care: Telemetry  Brief Admission History:  75 y.o. female with medical history of CKD stage III, diabetes mellitus type 2, hyperlipidemia, hypertension, iron deficiency, GERD presenting with generalized weakness.  The patient had recent back surgery on 03/18/2021 when she underwent a laminectomy L4-5 and L5-S1 with L5-S1 fusion.  The patient was discharged to [facility].  She returned home on 04/11/2021.  Apparently, the family was trying to get the patient up when she was so weak she slid and fell onto the ground.  As result, the patient was brought to the hospital for further evaluation.  The patient is difficult historian.  However she was able to answer simple questions.  She denies any fevers, chills, headache, neck pain, chest pain, shortness breath, nausea, vomiting, diarrhea, Donnell pain, dysuria, hematuria   Assessment & Plan:   Active Problems:   Mixed hyperlipidemia   Essential hypertension, benign   Acute kidney injury ([place])   Acute renal failure superimposed on chronic kidney disease, unspecified CKD stage, unspecified acute renal failure type (HCC)   Acute renal failure superimposed on stage 3b chronic kidney disease (HCC)  Acute on chronic renal failure--CKD3b -Secondary to volume depletion in the setting of furosemide and losartan usage -Holding losartan -Holding furosemide -Continue IV fluids -Renal ultrasound -Renal function continues to improve with IV fluid hydration, will continue repeat renal function panel in a.m.   UTI with Pyuria -Continue ceftriaxone x3 full doses and then discontinue -UA>50 WBC    Diabetes mellitus type 2 -Start low dose Semglee -NovoLog sliding scale -03/15/2021 hemoglobin A1c 7.5 -Holding glipizide and Trulicity CBG (last 3)  Recent Labs    04/11/21 2217 04/12/21 0832  04/12/21 1600  GLUCAP 180* 112* 108*    Essential hypertension -Holding metoprolol, losartan, amlodipine, verapamil secondary to soft blood pressure -unclear why she is on 2 calcium channel blockers   Hyperlipidemia -Continue statin   GERD -Continue PPI   Status postlaminectomy and L5-S1 with confusion -Judicious opioids -PDMP reviewed--pt received tramadol 03/25/21  Constipation - no BM in last several days per patient with mild abd distension - added miralax BID   DVT prophylaxis: SQ heparin Code Status: FULL  Family Communication: Discussed with daughter telephone 04/13/2021 Disposition: anticipating SNF placement  Status is: Inpatient  Remains inpatient appropriate because:IV treatments appropriate due to intensity of illness or inability to take PO and Inpatient level of care appropriate due to severity of illness  Dispo: The patient is from: Home              Anticipated d/c is to: SNF              Patient currently is not medically stable to d/c.   Difficult to place patient No   Consultants:  N/a  Procedures:  N/a  Antimicrobials:  Ceftriaxone 10/11>>   Subjective: Pt reports that she is feeling aching all over body but no chest pain and no shortness of breath.  No headaches.  Pt reports having constipation.   Objective: Vitals:   04/12/21 2258 04/13/21 0321 04/13/21 0524 04/13/21 0950  BP: 121/61 121/66 128/64   Pulse: (!) 109 77 76   Resp: 17 17 18    Temp: 98.9 F (37.2 C) 98.9 F (37.2 C) 98.7 F (37.1 C)   TempSrc: Oral  Oral   SpO2: 96% 94%  99% 98%  Weight:      Height:        Intake/Output Summary (Last 24 hours) at 04/13/2021 1200 Last data filed at 04/13/2021 0900 Gross per 24 hour  Intake 2570.2 ml  Output 700 ml  Net 1870.2 ml   Filed Weights   04/11/21 2204 04/12/21 1532  Weight: 82.1 kg 81.6 kg    Examination:  General exam: frail, elderly female, awake, alert, cooperative, NAD, Appears calm and comfortable. MM Dry.    Respiratory system: Clear to auscultation. Respiratory effort normal. Cardiovascular system: normal S1 & S2 heard. No JVD, murmurs, rubs, gallops or clicks. No pedal edema. Gastrointestinal system: Abdomen is mildly distended, soft and nontender. No organomegaly or masses felt. Normal bowel sounds heard. Central nervous system: Alert and oriented. No focal neurological deficits. Extremities: Symmetric 5 x 5 power. Skin: No rashes, lesions or ulcers Psychiatry: Judgement and insight appear poor. Mood & affect flat.   Data Reviewed: I have personally reviewed following labs and imaging studies  CBC: Recent Labs  Lab 04/11/21 2248  WBC 9.5  NEUTROABS 5.5  HGB 11.5*  HCT 36.2  MCV 88.9  PLT 408*    Basic Metabolic Panel: Recent Labs  Lab 04/11/21 2248 04/13/21 0830  NA 129* 140  K 5.2* 4.0  CL 99 113*  CO2 20* 20*  GLUCOSE 198* 106*  BUN 85* 49*  CREATININE 4.07* 1.86*  CALCIUM 9.0 8.5*    GFR: Estimated Creatinine Clearance: 26.4 mL/min (A) (by C-G formula based on SCr of 1.86 mg/dL (H)).  Liver Function Tests: Recent Labs  Lab 04/11/21 2248  AST 27  ALT 20  ALKPHOS 83  BILITOT 0.5  PROT 9.2*  ALBUMIN 3.7    CBG: Recent Labs  Lab 04/11/21 2217 04/12/21 0832 04/12/21 1600  GLUCAP 180* 112* 108*    Recent Results (from the past 240 hour(s))  Blood Culture (routine x 2)     Status: None (Preliminary result)   Collection Time: 04/11/21 10:48 PM   Specimen: BLOOD  Result Value Ref Range Status   Specimen Description BLOOD BLOOD LEFT HAND  Final   Special Requests   Final    BOTTLES DRAWN AEROBIC AND ANAEROBIC Blood Culture adequate volume   Culture   Final    NO GROWTH 2 DAYS Performed at [facility], [address] [place], [place] [address]    Report Status PENDING  Incomplete  Blood Culture (routine x 2)     Status: None (Preliminary result)   Collection Time: 04/11/21 10:48 PM   Specimen: BLOOD  Result Value Ref Range Status   Specimen  Description BLOOD RIGHT ANTECUBITAL  Final   Special Requests   Final    BOTTLES DRAWN AEROBIC AND ANAEROBIC Blood Culture adequate volume   Culture   Final    NO GROWTH 2 DAYS Performed at [facility], [address] [place], [place] [address]    Report Status PENDING  Incomplete  Resp Panel by RT-PCR (Flu A&B, Covid) Nasopharyngeal Swab     Status: None   Collection Time: 04/11/21 11:10 PM   Specimen: Nasopharyngeal Swab; Nasopharyngeal(NP) swabs in vial transport medium  Result Value Ref Range Status   SARS Coronavirus 2 by RT PCR NEGATIVE NEGATIVE Final    Comment: (NOTE) SARS-CoV-2 target nucleic acids are NOT DETECTED.  The SARS-CoV-2 RNA is generally detectable in upper respiratory specimens during the acute phase of infection. The lowest concentration of SARS-CoV-2 viral copies this assay can detect is 138  copies/mL. A negative result does not preclude SARS-Cov-2 infection and should not be used as the sole basis for treatment or other patient management decisions. A negative result may occur with  improper specimen collection/handling, submission of specimen other than nasopharyngeal swab, presence of viral mutation(s) within the areas targeted by this assay, and inadequate number of viral copies(<138 copies/mL). A negative result must be combined with clinical observations, patient history, and epidemiological information. The expected result is Negative.  Fact Sheet for Patients:  EntrepreneurPulse.com.au  Fact Sheet for Healthcare Providers:  IncredibleEmployment.be  This test is no t yet approved or cleared by the [place] FDA and  has been authorized for detection and/or diagnosis of SARS-CoV-2 by FDA under an Emergency Use Authorization (EUA). This EUA will remain  in effect (meaning this test can be used) for the duration of the COVID-19 declaration under Section 564(b)(1) of the Act, 21 U.S.C.section 360bbb-3(b)(1),  unless the authorization is terminated  or revoked sooner.       Influenza A by PCR NEGATIVE NEGATIVE Final   Influenza B by PCR NEGATIVE NEGATIVE Final    Comment: (NOTE) The Xpert Xpress SARS-CoV-2/FLU/RSV plus assay is intended as an aid in the diagnosis of influenza from Nasopharyngeal swab specimens and should not be used as a sole basis for treatment. Nasal washings and aspirates are unacceptable for Xpert Xpress SARS-CoV-2/FLU/RSV testing.  Fact Sheet for Patients: EntrepreneurPulse.com.au  Fact Sheet for Healthcare Providers: IncredibleEmployment.be  This test is not yet approved or cleared by the [place] FDA and has been authorized for detection and/or diagnosis of SARS-CoV-2 by FDA under an Emergency Use Authorization (EUA). This EUA will remain in effect (meaning this test can be used) for the duration of the COVID-19 declaration under Section 564(b)(1) of the Act, 21 U.S.C. section 360bbb-3(b)(1), unless the authorization is terminated or revoked.  Performed at [facility], [address], [place], [place] [address]   Urine Culture     Status: Abnormal   Collection Time: 04/12/21  3:24 AM   Specimen: In/Out Cath Urine  Result Value Ref Range Status   Specimen Description   Final    IN/OUT CATH URINE Performed at [facility], [address], [place], [place] [address]    Special Requests   Final    NONE Performed at [facility], [address], [place], [place] [address]    Culture MULTIPLE SPECIES PRESENT, SUGGEST RECOLLECTION (A)  Final   Report Status 04/13/2021 FINAL  Final     Radiology Studies: [facility] Chest Port 1 View  Result Date: 04/11/2021 CLINICAL DATA:  Questionable sepsis.  Evaluate for abnormality. EXAM: PORTABLE CHEST 1 VIEW COMPARISON:  03/17/2021 FINDINGS: Shallow inspiration with elevation of the right hemidiaphragm. Heart size and pulmonary vascularity are normal. Lungs are clear. No pleural  effusions. No pneumothorax. Mediastinal contours appear intact. Calcification of the aorta. IMPRESSION: Shallow inspiration with elevation of right hemidiaphragm. No active pulmonary disease. Electronically Signed   By: Lucienne Capers M.D.   On: 04/11/2021 23:06    Scheduled Meds:  amitriptyline  25 mg Oral QHS   budesonide  0.25 mg Nebulization BID   feeding supplement  237 mL Oral BID BM   gabapentin  300 mg Oral QHS   heparin  5,000 Units Subcutaneous Q8H   pantoprazole  40 mg Oral Daily   polyethylene glycol  17 g Oral BID   rosuvastatin  10 mg Oral QHS   sucralfate  1 g Oral BID   Continuous Infusions:  cefTRIAXone (ROCEPHIN)  IV     lactated ringers 50 mL/hr at 04/13/21 1050    LOS: 1 day   Time spent: 35 minutes   Hailey Miles Wynetta Emery, MD How to contact the [facility] Attending or Consulting provider [place] or covering provider during after hours [place], for this patient?  Check the care team in [facility] and look for a) attending/consulting TR[facility] provider listed and b) the [facility] team listed Log into [email or web address] and use [place]'s universal password to access. If you do not have the password, please contact the hospital operator. Locate the [facility] provider you are looking for under [facility] and page to a number that you can be directly reached. If you still have difficulty reaching the provider, please page the [facility] (Director on Call) for the Hospitalists listed on amion for assistance.  04/13/2021, 12:00 PM

## 2021-04-13 NOTE — NC FL2 (Signed)
[place] LEVEL OF CARE SCREENING TOOL     IDENTIFICATION  Patient Name: Stephanie Moreno Birthdate: [birth year] Sex: female Admission Date (Current Location): 04/11/2021  [facility] and [place] Number:  [employer] and Address:  [place] [address] [place]      Provider Number: [id number]  Attending Physician Name and Address:  Murlean Iba, MD  Relative Name and Phone Number:  ellis,terry Daughter     [phone]    Current Level of Care: Hospital Recommended Level of Care: [place] Prior Approval Number:    Date Approved/Denied:   PASRR Number: [id number]  Discharge Plan: SNF    Current Diagnoses: Patient Active Problem List   Diagnosis Date Noted   Acute kidney injury ([place]) 04/12/2021   Acute renal failure superimposed on chronic kidney disease, unspecified CKD stage, unspecified acute renal failure type ([place]) 04/12/2021   Acute renal failure superimposed on stage 3b chronic kidney disease ([place]) 04/12/2021   Dehydration    Spondylolisthesis, lumbar region    Spinal stenosis, lumbar region, with neurogenic claudication    Dysphagia 03/18/2021   Speech disturbance 03/18/2021   Wheezing 03/18/2021   Ileus ([place]) 03/18/2021   Fusion of spine of lumbar region 03/15/2021   Cerebrovascular disease 12/20/2020   GERD (gastroesophageal reflux disease) 11/18/2020   Type 2 diabetes mellitus with stage 3b chronic kidney disease, without long-term current use of insulin ([place]) 07/12/2020   Mixed hyperlipidemia 07/12/2020   Essential hypertension, benign 07/12/2020   Iron deficiency anemia    Loss of weight 04/09/2020   Dyspepsia 01/28/2020   Constipation 01/28/2020   Lower abdominal pain 01/28/2020   Normocytic anemia 01/28/2020    Orientation RESPIRATION BLADDER Height & Weight     Self, Time, Situation, Place  Normal Continent Weight: 179 lb 14.3 oz (81.6 kg) Height:  5\' 3"  (160 cm)   BEHAVIORAL SYMPTOMS/MOOD NEUROLOGICAL BOWEL NUTRITION STATUS      Continent Diet (carb modified)  AMBULATORY STATUS COMMUNICATION OF NEEDS Skin   Limited Assist Verbally Normal                       Personal Care Assistance Level of Assistance  Bathing, Feeding, Dressing Bathing Assistance: Limited assistance Feeding assistance: Independent Dressing Assistance: Limited assistance     Functional Limitations Info  Sight, Hearing, Speech Sight Info: Adequate Hearing Info: Adequate Speech Info: Adequate    SPECIAL CARE FACTORS FREQUENCY  PT (By licensed PT)     PT Frequency: 5x/week              Contractures Contractures Info: Not present    Additional Factors Info  Code Status, Allergies Code Status Info: Full Allergies Info: Codeine, hydrochlorothiaxide, lisinopril, oxybutynin, penicillins           Current Medications (04/13/2021):  This is the current hospital active medication list Current Facility-Administered Medications  Medication Dose Route Frequency Provider Last Rate Last Admin   acetaminophen (TYLENOL) tablet 650 mg  650 mg Oral Q6H PRN Tat, David, MD   650 mg at 04/13/21 1243   Or   acetaminophen (TYLENOL) suppository 650 mg  650 mg Rectal Q6H PRN Tat, Shanon Brow, MD       amitriptyline (ELAVIL) tablet 25 mg  25 mg Oral Benay Pike, MD   25 mg at 04/12/21 2151   budesonide (PULMICORT) nebulizer solution 0.25 mg  0.25 mg Nebulization BID Tat, Shanon Brow, MD   0.25 mg  at 04/13/21 0949   cefTRIAXone (ROCEPHIN) 1 g in sodium chloride 0.9 % 100 mL IVPB  1 g Intravenous Q24H Johnson, Clanford L, MD       feeding supplement (ENSURE ENLIVE / ENSURE PLUS) liquid 237 mL  237 mL Oral BID BM Tat, David, MD   237 mL at 04/13/21 1430   fluticasone (FLONASE) 50 MCG/ACT nasal spray 2 spray  2 spray Each Nare Daily PRN Tat, Shanon Brow, MD       gabapentin (NEURONTIN) capsule 300 mg  300 mg Oral Benay Pike, MD   300 mg at 04/12/21 2151   heparin injection 5,000 Units   5,000 Units Subcutaneous Franco Collet, MD   5,000 Units at 04/13/21 1431   lactated ringers infusion   Intravenous Continuous Johnson, Clanford L, MD 50 mL/hr at 04/13/21 1050 New Bag at 04/13/21 1050   methocarbamol (ROBAXIN) tablet 500 mg  500 mg Oral Q6H PRN Tat, Shanon Brow, MD   500 mg at 04/13/21 0727   ondansetron (ZOFRAN) tablet 4 mg  4 mg Oral Q6H PRN Tat, David, MD       Or   ondansetron (ZOFRAN) injection 4 mg  4 mg Intravenous Q6H PRN Tat, Shanon Brow, MD       pantoprazole (PROTONIX) EC tablet 40 mg  40 mg Oral Daily Tat, David, MD   40 mg at 04/13/21 1051   polyethylene glycol (MIRALAX / GLYCOLAX) packet 17 g  17 g Oral BID Johnson, Clanford L, MD   17 g at 04/13/21 1050   rosuvastatin (CRESTOR) tablet 10 mg  10 mg Oral Benay Pike, MD   10 mg at 04/12/21 2151   sucralfate (CARAFATE) tablet 1 g  1 g Oral BID Tat, Shanon Brow, MD   1 g at 04/13/21 1051     Discharge Medications: Please see discharge summary for a list of discharge medications.  Relevant Imaging Results:  Relevant Lab Results:   Additional Information SSN:  [id number], COVID x2  Krisann Mckenna D, LCSW

## 2021-04-13 NOTE — Evaluation (Signed)
Physical Therapy Evaluation Patient Details Name: Stephanie Moreno MRN: [id number] DOB: 05-07-1946 Today's Date: 04/13/2021  History of Present Illness  Stephanie Moreno is a 75 y.o. female with medical history of CKD stage III, diabetes mellitus type 2, hyperlipidemia, hypertension, iron deficiency, GERD presenting with generalized weakness.  The patient had recent back surgery on 03/18/2021 when she underwent a laminectomy L4-5 and L5-S1 with L5-S1 fusion.  The patient was discharged to [facility].  She returned home on 04/11/2021.  Apparently, the family was trying to get the patient up when she was so weak she slid and fell onto the ground.  As result, the patient was brought to the hospital for further evaluation.  The patient is difficult historian.  However she was able to answer simple questions.  She denies any fevers, chills, headache, neck pain, chest pain, shortness breath, nausea, vomiting, diarrhea, Donnell pain, dysuria, hematuria.   Clinical Impression  Patient limited for functional mobility as stated below secondary to BLE weakness, fatigue and poor standing balance. Patient is a difficult historian at times with home set up/PLOF. Patient requires min assist to pull to seated EOB with HOB elevated. She demonstrates good sitting tolerance and sitting balance EOB. She transfers to standing x2 with use of RW and assist for bilateral LE weakness. Patient limited by fatigue with first round of standing. Patient given cueing for sequencing with transfers and with taking several very slow, labored steps at bedside to chair. Patient ended session seated in chair. Patient will benefit from continued physical therapy in hospital and recommended venue below to increase strength, balance, endurance for safe ADLs and gait.        Recommendations for follow up therapy are one component of a multi-disciplinary discharge planning process, led by the attending physician.  Recommendations may be updated based on  patient status, additional functional criteria and insurance authorization.  Follow Up Recommendations SNF    Equipment Recommendations  None recommended by PT    Recommendations for Other Services       Precautions / Restrictions Precautions Precautions: Fall Restrictions Weight Bearing Restrictions: No      Mobility  Bed Mobility Overal bed mobility: Needs Assistance Bed Mobility: Supine to Sit     Supine to sit: Min assist     General bed mobility comments: min assist to pull to seated EOB    Transfers Overall transfer level: Needs assistance Equipment used: Rolling walker (2 wheeled) Transfers: Sit to/from [facility] Sit to Stand: Mod assist Stand pivot transfers: Min assist;Mod assist       General transfer comment: slow, labored transfer to standing x2 with cueing for sequencing and RW use  Ambulation/Gait Ambulation/Gait assistance: Min assist;Mod assist Gait Distance (Feet): 2 Feet Assistive device: Rolling walker (2 wheeled) Gait Pattern/deviations: Shuffle Gait velocity: decreased   General Gait Details: slow, labored, unsteady shuffled steps at bedside with cueing, unsteady  Stairs            Wheelchair Mobility    Modified Rankin (Stroke Patients Only)       Balance Overall balance assessment: Needs assistance Sitting-balance support: No upper extremity supported Sitting balance-Leahy Scale: Good Sitting balance - Comments: seated EOB   Standing balance support: Bilateral upper extremity supported Standing balance-Leahy Scale: Poor Standing balance comment: fair/poor with RW                             Pertinent Vitals/Pain Pain Assessment: 0-10  Pain Score: 9  Pain Location: head and stomach Pain Descriptors / Indicators: Aching Pain Intervention(s): Limited activity within patient's tolerance;Monitored during session;Repositioned    Home Living Family/patient expects to be discharged to::  Unsure Living Arrangements: Alone               Additional Comments: Patient states she was living alone in a 1 story home    Prior Function Level of Independence: Independent with assistive device(s)         Comments: Patient states independent with RW, applying for home care to assist; patient is a difficult historian with details of home set up, equipment and PLOF     Hand Dominance        Extremity/Trunk Assessment   Upper Extremity Assessment Upper Extremity Assessment: Generalized weakness    Lower Extremity Assessment Lower Extremity Assessment: Generalized weakness    Cervical / Trunk Assessment Cervical / Trunk Assessment: Kyphotic  Communication   Communication: No difficulties;Expressive difficulties  Cognition Arousal/Alertness: Awake/alert Behavior During Therapy: WFL for tasks assessed/performed Overall Cognitive Status: No family/caregiver present to determine baseline cognitive functioning                                        General Comments      Exercises     Assessment/Plan    PT Assessment Patient needs continued PT services  PT Problem List Decreased strength;Decreased mobility;Decreased activity tolerance;Decreased balance       PT Treatment Interventions DME instruction;Therapeutic exercise;Gait training;Balance training;Stair training;Neuromuscular re-education;Functional mobility training;Patient/family education;Therapeutic activities    PT Goals (Current goals can be found in the Care Plan section)  Acute Rehab PT Goals Patient Stated Goal: Return home PT Goal Formulation: With patient Time For Goal Achievement: 04/27/21 Potential to Achieve Goals: Fair    Frequency Min 3X/week   Barriers to discharge        Co-evaluation               AM-PAC PT "6 Clicks" Mobility  Outcome Measure Help needed turning from your back to your side while in a flat bed without using bedrails?: A Little Help  needed moving from lying on your back to sitting on the side of a flat bed without using bedrails?: A Little Help needed moving to and from a bed to a chair (including a wheelchair)?: A Lot Help needed standing up from a chair using your arms (e.g., wheelchair or bedside chair)?: A Lot Help needed to walk in hospital room?: A Lot Help needed climbing 3-5 steps with a railing? : Total 6 Click Score: 13    End of Session Equipment Utilized During Treatment: Gait belt Activity Tolerance: Patient tolerated treatment well;Patient limited by fatigue Patient left: in chair;with call bell/phone within reach Nurse Communication: Mobility status PT Visit Diagnosis: Unsteadiness on feet (R26.81);Other abnormalities of gait and mobility (R26.89);Muscle weakness (generalized) (M62.81);History of falling (Z91.81)    Time: 1131-1200 PT Time Calculation (min) (ACUTE ONLY): 29 min   Charges:   PT Evaluation $PT Eval Low Complexity: 1 Low PT Treatments $Therapeutic Activity: 23-37 mins        1:07 PM, 04/13/21 Mearl Latin PT, DPT Physical Therapist at [facility]

## 2021-04-13 NOTE — Plan of Care (Signed)
  Problem: Acute Rehab PT Goals(only PT should resolve) Goal: Patient Will Transfer Sit To/From Stand Outcome: Progressing Flowsheets (Taken 04/13/2021 1309) Patient will transfer sit to/from stand:  with min guard assist  with minimal assist Goal: Pt Will Transfer Bed To Chair/Chair To Bed Outcome: Progressing Flowsheets (Taken 04/13/2021 1309) Pt will Transfer Bed to Chair/Chair to Bed:  with min assist  min guard assist Goal: Pt Will Ambulate Outcome: Progressing Flowsheets (Taken 04/13/2021 1309) Pt will Ambulate:  25 feet  with least restrictive assistive device  with supervision Goal: Pt/caregiver will Perform Home Exercise Program Outcome: Progressing Flowsheets (Taken 04/13/2021 1309) Pt/caregiver will Perform Home Exercise Program:  For increased strengthening  Independently  For improved balance  1:10 PM, 04/13/21 Mearl Latin PT, DPT Physical Therapist at [facility]

## 2021-04-13 NOTE — Progress Notes (Signed)
Patient refused CPAP for tonight. Told patient to call if she changed her mind. Unit still at bedside if needed.

## 2021-04-13 NOTE — Progress Notes (Signed)
Initial Nutrition Assessment  DOCUMENTATION CODES:      INTERVENTION:  Ensure Enlive po BID, each supplement provides 350 kcal and 20 grams of protein   Encourage fluids between meals and ambulation if appropriate per PT/MD  NUTRITION DIAGNOSIS:   Inadequate oral intake related to acute illness (constipation) as evidenced by per patient/family report.   GOAL:  Patient will meet greater than or equal to 90% of their needs  MONITOR:  PO intake, Supplement acceptance, Labs, Weight trends  REASON FOR ASSESSMENT:   Malnutrition Screening Tool    ASSESSMENT: RD working  Patient is a 75 yo female with hx of HTN, DM2, CKD3, GERD, and anemia. Presents with weakness.  Patient ate 50% of dinner last night. She has not eaten today. Talked with patient by phone. Her bowels has not moved in 3 days and is not hungry at the moment. Laxative med started. Encouraged pt to consume fluids throughout the day to help facilitate the action of laxative medication.  Patient weights are stable the past month and have ranged between 82-87 kg since May. Currently 81.6 kg. Overall a downward trend.   Medications reviewed and include: Miralax, Crestor, Carafate  Lactated ringers @ 50 ml/hr.   Labs: BMP Latest Ref Rng & Units 04/13/2021 04/11/2021 03/17/2021  Glucose 70 - 99 mg/dL 106(H) 198(H) 122(H)  BUN 8 - 23 mg/dL 49(H) 85(H) 18  Creatinine 0.44 - 1.00 mg/dL 1.86(H) 4.07(H) 1.39(H)  BUN/Creat Ratio 6 - 22 (calc) - - -  Sodium 135 - 145 mmol/L 140 129(L) 136  Potassium 3.5 - 5.1 mmol/L 4.0 5.2(H) 3.9  Chloride 98 - 111 mmol/L 113(H) 99 108  CO2 22 - 32 mmol/L 20(L) 20(L) 20(L)  Calcium 8.9 - 10.3 mg/dL 8.5(L) 9.0 7.8(L)      NUTRITION - FOCUSED PHYSICAL EXAM: Unable to complete Nutrition-Focused physical exam at this time.  RD working remotely  Diet Order:   Diet Order             Diet Carb Modified Fluid consistency: Thin; Room service appropriate? Yes  Diet effective now                    EDUCATION NEEDS:  Education needs have been addressed  Skin:  Skin Assessment: Reviewed RN Assessment  Last BM:  10/9  Height:   Ht Readings from Last 1 Encounters:  04/12/21 5\' 3"  (1.6 m)    Weight:   Wt Readings from Last 1 Encounters:  04/12/21 81.6 kg    Ideal Body Weight:   52 kg  BMI:  Body mass index is 31.87 kg/m.  Estimated Nutritional Needs:   Kcal:  1600-1700  Protein:  75-80 gr  Fluid:  1.6-1.7 liters daily   Colman Cater MS,RD,CSG,LDN Contact: Shea Evans

## 2021-04-13 NOTE — TOC Initial Note (Signed)
Transition of Care [facility]) - Initial/Assessment Note    Patient Details  Name: Stephanie Moreno MRN: [id number] Date of Birth: 05-08-1946  Transition of Care [facility]) CM/SW Contact:    Ihor Gully, LCSW Phone Number: 04/13/2021, 1:03 PM  Clinical Narrative:                 Patient was in short-term rehab at [facility] and was released, however she came home and EMS had to be contacted within an home as her daughter says patient was unable to ambulate. Daughter, Stephanie Moreno, says that her, her daughter, and patient ended up falling because patient's lower half would not work.   Stephanie Moreno wants rehab again. Discussed that patient would have to be recommended and authorized by [facility]. Also discussed the likelihood of patient being in copay days. Stephanie Moreno advised that if patient was denied rehab she would appeal the denial.  Patient lives alone at baseline and has not been able to ambulate in 3.5 weeks since her surgery.  PT evaluation pending.   Expected Discharge Plan: Skilled Nursing Facility Barriers to Discharge: Continued Medical Work up   Patient Goals and CMS Choice Patient states their goals for this hospitalization and ongoing recovery are:: daughter wants mother to go to another rehabilitation faclity.      Expected Discharge Plan and Services Expected Discharge Plan: [place] arrangements for the past 2 months: Apartment                                      Prior Living Arrangements/Services Living arrangements for the past 2 months: Apartment Lives with:: Self Stephanie Moreno) Patient language and need for interpreter reviewed:: Yes Do you feel safe going back to the place where you live?: Yes      Need for Family Participation in Patient Care: Yes (Comment) Care giver support system in place?: Yes (comment) Current home services: DME Criminal Activity/Legal Involvement Pertinent to Current Situation/Hospitalization: No - Comment as  needed  Activities of Daily Living Home Assistive Devices/Equipment: Cane (specify quad or straight), Walker (specify type), Wheelchair, CBG Meter, Bedside commode/3-in-1 ADL Screening (condition at time of admission) Patient's cognitive ability adequate to safely complete daily activities?: Yes Is the patient deaf or have difficulty hearing?: No Does the patient have difficulty seeing, even when wearing glasses/contacts?: No Does the patient have difficulty concentrating, remembering, or making decisions?: Yes Patient able to express need for assistance with ADLs?: Yes Does the patient have difficulty dressing or bathing?: Yes Independently performs ADLs?: No Communication: Independent Dressing (OT): Needs assistance Is this a change from baseline?: Pre-admission baseline Grooming: Needs assistance Is this a change from baseline?: Pre-admission baseline Feeding: Independent Bathing: Needs assistance Is this a change from baseline?: Pre-admission baseline Toileting: Needs assistance Is this a change from baseline?: Pre-admission baseline In/Out Bed: Needs assistance Is this a change from baseline?: Pre-admission baseline Walks in Home: Needs assistance Is this a change from baseline?: Pre-admission baseline Does the patient have difficulty walking or climbing stairs?: Yes Weakness of Legs: Both Weakness of Arms/Hands: None  Permission Sought/Granted Permission sought to share information with : Family Supports    Share Information with NAME: Stephanie Moreno     Permission granted to share info w Relationship: daughter     Emotional Assessment       Orientation: : Oriented to Place, Oriented to Self, Oriented  to  Time, Oriented to Situation Alcohol / Substance Use: Not Applicable Psych Involvement: No (comment)  Admission diagnosis:  Dehydration [E86.0] AKI (acute kidney injury) ([place]) [N17.9] Acute kidney injury ([place]) [N17.9] Acute renal failure superimposed on chronic  kidney disease, unspecified CKD stage, unspecified acute renal failure type ([place]) [N17.9, N18.9] Patient Active Problem List   Diagnosis Date Noted   Acute kidney injury ([place]) 04/12/2021   Acute renal failure superimposed on chronic kidney disease, unspecified CKD stage, unspecified acute renal failure type ([place]) 04/12/2021   Acute renal failure superimposed on stage 3b chronic kidney disease ([place]) 04/12/2021   Dehydration    Spondylolisthesis, lumbar region    Spinal stenosis, lumbar region, with neurogenic claudication    Dysphagia 03/18/2021   Speech disturbance 03/18/2021   Wheezing 03/18/2021   Ileus ([place]) 03/18/2021   Fusion of spine of lumbar region 03/15/2021   Cerebrovascular disease 12/20/2020   GERD (gastroesophageal reflux disease) 11/18/2020   Type 2 diabetes mellitus with stage 3b chronic kidney disease, without long-term current use of insulin ([place]) 07/12/2020   Mixed hyperlipidemia 07/12/2020   Essential hypertension, benign 07/12/2020   Iron deficiency anemia    Loss of weight 04/09/2020   Dyspepsia 01/28/2020   Constipation 01/28/2020   Lower abdominal pain 01/28/2020   Normocytic anemia 01/28/2020   PCP:  Margretta Sidle, MD Pharmacy:   [id number] [place], [address] [place] [address] Phone: [phone] Fax: [phone]  [facility] [address] [place] [address] Phone: [phone] Fax: [phone]     Social Determinants of Health (SDOH) Interventions    Readmission Risk Interventions No flowsheet data found.

## 2021-04-14 DIAGNOSIS — K5909 Other constipation: Secondary | ICD-10-CM | POA: Diagnosis not present

## 2021-04-14 DIAGNOSIS — N179 Acute kidney failure, unspecified: Secondary | ICD-10-CM | POA: Diagnosis not present

## 2021-04-14 DIAGNOSIS — E782 Mixed hyperlipidemia: Secondary | ICD-10-CM | POA: Diagnosis not present

## 2021-04-14 DIAGNOSIS — I10 Essential (primary) hypertension: Secondary | ICD-10-CM | POA: Diagnosis not present

## 2021-04-14 LAB — CBC WITH DIFFERENTIAL/PLATELET
Abs Immature Granulocytes: 0.04 10*3/uL (ref 0.00–0.07)
Basophils Absolute: 0.1 10*3/uL (ref 0.0–0.1)
Basophils Relative: 1 %
Eosinophils Absolute: 0.4 10*3/uL (ref 0.0–0.5)
Eosinophils Relative: 7 %
HCT: 33.4 % — ABNORMAL LOW (ref 36.0–46.0)
Hemoglobin: 10.1 g/dL — ABNORMAL LOW (ref 12.0–15.0)
Immature Granulocytes: 1 %
Lymphocytes Relative: 49 %
Lymphs Abs: 3.1 10*3/uL (ref 0.7–4.0)
MCH: 27.2 pg (ref 26.0–34.0)
MCHC: 30.2 g/dL (ref 30.0–36.0)
MCV: 89.8 fL (ref 80.0–100.0)
Monocytes Absolute: 0.5 10*3/uL (ref 0.1–1.0)
Monocytes Relative: 8 %
Neutro Abs: 2.1 10*3/uL (ref 1.7–7.7)
Neutrophils Relative %: 34 %
Platelets: 268 10*3/uL (ref 150–400)
RBC: 3.72 MIL/uL — ABNORMAL LOW (ref 3.87–5.11)
RDW: 13.4 % (ref 11.5–15.5)
WBC: 6.3 10*3/uL (ref 4.0–10.5)
nRBC: 0 % (ref 0.0–0.2)

## 2021-04-14 LAB — RENAL FUNCTION PANEL
Albumin: 3 g/dL — ABNORMAL LOW (ref 3.5–5.0)
Anion gap: 6 (ref 5–15)
BUN: 31 mg/dL — ABNORMAL HIGH (ref 8–23)
CO2: 22 mmol/L (ref 22–32)
Calcium: 8.9 mg/dL (ref 8.9–10.3)
Chloride: 114 mmol/L — ABNORMAL HIGH (ref 98–111)
Creatinine, Ser: 1.35 mg/dL — ABNORMAL HIGH (ref 0.44–1.00)
GFR, Estimated: 41 mL/min — ABNORMAL LOW (ref >60)
Glucose, Bld: 137 mg/dL — ABNORMAL HIGH (ref 70–99)
Phosphorus: 2.3 mg/dL — ABNORMAL LOW (ref 2.5–4.6)
Potassium: 3.9 mmol/L (ref 3.5–5.1)
Sodium: 142 mmol/L (ref 135–145)

## 2021-04-14 LAB — RESP PANEL BY RT-PCR (FLU A&B, COVID) ARPGX2
Influenza A by PCR: NEGATIVE
Influenza B by PCR: NEGATIVE
SARS Coronavirus 2 by RT PCR: NEGATIVE

## 2021-04-14 MED ORDER — CHLORHEXIDINE GLUCONATE 0.12 % MT SOLN: 15.0000 mL | Freq: Four times a day (QID) | OROMUCOSAL | Status: DC

## 2021-04-14 MED ORDER — CHLORHEXIDINE GLUCONATE 0.12 % MT SOLN
15.0000 mL | Freq: Four times a day (QID) | OROMUCOSAL | Status: DC
  Administered 2021-04-14 – 2021-04-15 (×3): 15 mL via OROMUCOSAL
  Filled 2021-04-14 (×2): qty 15

## 2021-04-14 MED ORDER — ADULT MULTIVITAMIN W/MINERALS CH
1.0000 | ORAL_TABLET | Freq: Every day | ORAL | Status: DC
  Administered 2021-04-14 – 2021-04-15 (×2): 1 via ORAL
  Filled 2021-04-14 (×2): qty 1

## 2021-04-14 MED ORDER — AMLODIPINE BESYLATE 5 MG PO TABS
5.0000 mg | ORAL_TABLET | Freq: Every day | ORAL | Status: DC
  Administered 2021-04-14 – 2021-04-15 (×2): 5 mg via ORAL
  Filled 2021-04-14 (×2): qty 1

## 2021-04-14 MED ORDER — METOPROLOL TARTRATE 50 MG PO TABS
50.0000 mg | ORAL_TABLET | Freq: Two times a day (BID) | ORAL | Status: DC
  Administered 2021-04-14 – 2021-04-15 (×3): 50 mg via ORAL
  Filled 2021-04-14 (×3): qty 1

## 2021-04-14 MED ORDER — LUBIPROSTONE 8 MCG PO CAPS
8.0000 ug | ORAL_CAPSULE | Freq: Two times a day (BID) | ORAL | Status: DC
  Administered 2021-04-14 – 2021-04-15 (×2): 8 ug via ORAL
  Filled 2021-04-14 (×8): qty 1

## 2021-04-14 NOTE — Progress Notes (Signed)
Patient c/o abdomen discomfort and states she hasnt voided. Did a bladder scan and she has 737 cc in bladder. N.O to do an I/O.

## 2021-04-14 NOTE — TOC Progression Note (Signed)
Transition of Care [facility]) - Progression Note    Patient Details  Name: Stephanie Moreno MRN: [id number] Date of Birth: 11-03-1945  Transition of Care [facility]) CM/SW Contact  Salome Arnt, [place] Phone Number: 04/14/2021, 3:22 PM  Clinical Narrative:  LCSW presented bed offers to pt who chooses [place]. Auth received [id number], starting 10/13 with next review 10/17). Per admissions, pt is in copay days and will need to pay $1361.50 up front at admission for first 7 days. Pt has $7550 out of pocket maximum and has met $1802 of this amount. Anticipate d/c tomorrow- daughter aware. MD has ordered COVID test. TOC will continue to follow.       Expected Discharge Plan: [place] Barriers to Discharge: Continued Medical Work up  Expected Discharge Plan and Services Expected Discharge Plan: [place] arrangements for the past 2 months: Apartment                                       Social Determinants of Health (SDOH) Interventions    Readmission Risk Interventions No flowsheet data found.

## 2021-04-14 NOTE — Telephone Encounter (Signed)
Per Dr. Louanne Skye, Call Terri and find out if she is home or at the facility -usually facility sets up [facility] after leaving facility.

## 2021-04-14 NOTE — Progress Notes (Signed)
PROGRESS NOTE   Stephanie Moreno  YKD:[id number] DOB: 08/21/1945 DOA: 04/11/2021 PCP: Margretta Sidle, MD   Chief Complaint  Patient presents with   Fall   Level of care: Telemetry  Brief Admission History:  75 y.o. female with medical history of CKD stage III, diabetes mellitus type 2, hyperlipidemia, hypertension, iron deficiency, GERD presenting with generalized weakness.  The patient had recent back surgery on 03/18/2021 when she underwent a laminectomy L4-5 and L5-S1 with L5-S1 fusion.  The patient was discharged to [facility].  She returned home on 04/11/2021.  Apparently, the family was trying to get the patient up when she was so weak she slid and fell onto the ground.  As result, the patient was brought to the hospital for further evaluation.  The patient is difficult historian.  However she was able to answer simple questions.  She denies any fevers, chills, headache, neck pain, chest pain, shortness breath, nausea, vomiting, diarrhea, Donnell pain, dysuria, hematuria   Assessment & Plan:   Active Problems:   Mixed hyperlipidemia   Essential hypertension, benign   Acute kidney injury ([place])   Acute renal failure superimposed on chronic kidney disease, unspecified CKD stage, unspecified acute renal failure type (HCC)   Acute renal failure superimposed on stage 3b chronic kidney disease (HCC)  Acute on chronic renal failure--CKD3b -Secondary to volume depletion in the setting of furosemide and losartan usage -Holding losartan -Holding furosemide -Continue IV fluids -Renal ultrasound -Renal function continues to improve with IV fluid hydration, will continue repeat renal function panel in a.m.   UTI with Pyuria -Continue ceftriaxone x3 full doses and then discontinue -UA>50 WBC    Diabetes mellitus type 2 -Start low dose Semglee -NovoLog sliding scale -03/15/2021 hemoglobin A1c 7.5 -Holding glipizide and Trulicity CBG (last 3)  Recent Labs    04/11/21 2217 04/12/21 0832  04/12/21 1600  GLUCAP 180* 112* 108*    Essential hypertension -BPs have now rebounded, resume home amlodipine, metoprolol, follow BP   Hyperlipidemia -Continue statin   GERD -Continue PPI   Status postlaminectomy and L5-S1 with confusion -Judicious opioids -PDMP reviewed--pt received tramadol 03/25/21  Constipation - no BM in last several days per patient with mild abd distension - added home amitiza 8 mcg BID    DVT prophylaxis: SQ heparin Code Status: FULL  Family Communication: Discussed with daughter telephone 04/13/2021 Disposition: anticipating SNF placement  Status is: Inpatient  Remains inpatient appropriate because:IV treatments appropriate due to intensity of illness or inability to take PO and Inpatient level of care appropriate due to severity of illness  Dispo: The patient is from: Home              Anticipated d/c is to: SNF              Patient currently is not medically stable to d/c.   Difficult to place patient No   Consultants:  N/a  Procedures:  N/a  Antimicrobials:  Ceftriaxone 10/11>>   Subjective: Pt reports that her tongue feels dry and irritated.   Objective: Vitals:   04/14/21 0814 04/14/21 0930 04/14/21 0936 04/14/21 1305  BP:  (!) 147/75 (!) 158/72 (!) 151/81  Pulse:  87 87 89  Resp:  20 18 17   Temp:  98.1 F (36.7 C) 97.8 F (36.6 C) 97.7 F (36.5 C)  TempSrc:  Oral Oral Oral  SpO2: 97% 100% 100% 100%  Weight:      Height:        Intake/Output Summary (Last 24  hours) at 04/14/2021 1343 Last data filed at 04/14/2021 0900 Gross per 24 hour  Intake 743.7 ml  Output 1600 ml  Net -856.3 ml   Filed Weights   04/11/21 2204 04/12/21 1532  Weight: 82.1 kg 81.6 kg   Examination:  General exam: frail, elderly female, awake, alert, cooperative, NAD, Appears calm and comfortable. MM dry but slightly improved from prior exam.    Respiratory system: Clear to auscultation. Respiratory effort normal. Cardiovascular system:  normal S1 & S2 heard. No JVD, murmurs, rubs, gallops or clicks. No pedal edema. Gastrointestinal system: Abdomen is mildly distended, soft and nontender. No organomegaly or masses felt. Normal bowel sounds heard. Central nervous system: Alert and oriented. No focal neurological deficits. Extremities: Symmetric 5 x 5 power. Skin: No rashes, lesions or ulcers Psychiatry: Judgement and insight appear poor. Mood & affect flat.   Data Reviewed: I have personally reviewed following labs and imaging studies  CBC: Recent Labs  Lab 04/11/21 2248 04/14/21 0653  WBC 9.5 6.3  NEUTROABS 5.5 2.1  HGB 11.5* 10.1*  HCT 36.2 33.4*  MCV 88.9 89.8  PLT 408* 109    Basic Metabolic Panel: Recent Labs  Lab 04/11/21 2248 04/13/21 0830 04/14/21 0653  NA 129* 140 142  K 5.2* 4.0 3.9  CL 99 113* 114*  CO2 20* 20* 22  GLUCOSE 198* 106* 137*  BUN 85* 49* 31*  CREATININE 4.07* 1.86* 1.35*  CALCIUM 9.0 8.5* 8.9  PHOS  --   --  2.3*    GFR: Estimated Creatinine Clearance: 36.4 mL/min (A) (by C-G formula based on SCr of 1.35 mg/dL (H)).  Liver Function Tests: Recent Labs  Lab 04/11/21 2248 04/14/21 0653  AST 27  --   ALT 20  --   ALKPHOS 83  --   BILITOT 0.5  --   PROT 9.2*  --   ALBUMIN 3.7 3.0*    CBG: Recent Labs  Lab 04/11/21 2217 04/12/21 0832 04/12/21 1600  GLUCAP 180* 112* 108*    Recent Results (from the past 240 hour(s))  Blood Culture (routine x 2)     Status: None (Preliminary result)   Collection Time: 04/11/21 10:48 PM   Specimen: BLOOD  Result Value Ref Range Status   Specimen Description BLOOD BLOOD LEFT HAND  Final   Special Requests   Final    BOTTLES DRAWN AEROBIC AND ANAEROBIC Blood Culture adequate volume   Culture   Final    NO GROWTH 3 DAYS Performed at [facility], [address] [place], [place] [address]    Report Status PENDING  Incomplete  Blood Culture (routine x 2)     Status: None (Preliminary result)   Collection Time: 04/11/21 10:48 PM    Specimen: BLOOD  Result Value Ref Range Status   Specimen Description BLOOD RIGHT ANTECUBITAL  Final   Special Requests   Final    BOTTLES DRAWN AEROBIC AND ANAEROBIC Blood Culture adequate volume   Culture   Final    NO GROWTH 3 DAYS Performed at [facility], [address], [place], [place] [address]    Report Status PENDING  Incomplete  Resp Panel by RT-PCR (Flu A&B, Covid) Nasopharyngeal Swab     Status: None   Collection Time: 04/11/21 11:10 PM   Specimen: Nasopharyngeal Swab; Nasopharyngeal(NP) swabs in vial transport medium  Result Value Ref Range Status   SARS Coronavirus 2 by RT PCR NEGATIVE NEGATIVE Final    Comment: (NOTE) SARS-CoV-2 target nucleic acids are NOT DETECTED.  The SARS-CoV-2 RNA is generally detectable in upper respiratory specimens during the acute phase of infection. The lowest concentration of SARS-CoV-2 viral copies this assay can detect is 138 copies/mL. A negative result does not preclude SARS-Cov-2 infection and should not be used as the sole basis for treatment or other patient management decisions. A negative result may occur with  improper specimen collection/handling, submission of specimen other than nasopharyngeal swab, presence of viral mutation(s) within the areas targeted by this assay, and inadequate number of viral copies(<138 copies/mL). A negative result must be combined with clinical observations, patient history, and epidemiological information. The expected result is Negative.  Fact Sheet for Patients:  EntrepreneurPulse.com.au  Fact Sheet for Healthcare Providers:  IncredibleEmployment.be  This test is no t yet approved or cleared by the [place] FDA and  has been authorized for detection and/or diagnosis of SARS-CoV-2 by FDA under an Emergency Use Authorization (EUA). This EUA will remain  in effect (meaning this test can be used) for the duration of the COVID-19 declaration under Section  564(b)(1) of the Act, 21 U.S.C.section 360bbb-3(b)(1), unless the authorization is terminated  or revoked sooner.       Influenza A by PCR NEGATIVE NEGATIVE Final   Influenza B by PCR NEGATIVE NEGATIVE Final    Comment: (NOTE) The Xpert Xpress SARS-CoV-2/FLU/RSV plus assay is intended as an aid in the diagnosis of influenza from Nasopharyngeal swab specimens and should not be used as a sole basis for treatment. Nasal washings and aspirates are unacceptable for Xpert Xpress SARS-CoV-2/FLU/RSV testing.  Fact Sheet for Patients: EntrepreneurPulse.com.au  Fact Sheet for Healthcare Providers: IncredibleEmployment.be  This test is not yet approved or cleared by the [place] FDA and has been authorized for detection and/or diagnosis of SARS-CoV-2 by FDA under an Emergency Use Authorization (EUA). This EUA will remain in effect (meaning this test can be used) for the duration of the COVID-19 declaration under Section 564(b)(1) of the Act, 21 U.S.C. section 360bbb-3(b)(1), unless the authorization is terminated or revoked.  Performed at [facility], [address] [place], [place] [address]   Urine Culture     Status: Abnormal   Collection Time: 04/12/21  3:24 AM   Specimen: In/Out Cath Urine  Result Value Ref Range Status   Specimen Description   Final    IN/OUT CATH URINE Performed at [facility], [address] [place], [place] [address]    Special Requests   Final    NONE Performed at [facility], [address] [place], [place] [address]    Culture MULTIPLE SPECIES PRESENT, SUGGEST RECOLLECTION (A)  Final   Report Status 04/13/2021 FINAL  Final     Radiology Studies: No results found.  Scheduled Meds:  amitriptyline  25 mg Oral QHS   budesonide  0.25 mg Nebulization BID   chlorhexidine  15 mL Mouth/Throat QID   feeding supplement  237 mL Oral BID BM   gabapentin  300 mg Oral QHS   heparin  5,000 Units Subcutaneous Q8H    pantoprazole  40 mg Oral Daily   polyethylene glycol  17 g Oral BID   rosuvastatin  10 mg Oral QHS   sucralfate  1 g Oral BID   Continuous Infusions:  lactated ringers 50 mL/hr at 04/13/21 1050    LOS: 2 days   Time spent: 35 minutes   Gaege Sangalang Wynetta Emery, MD How to contact the [facility] Attending or Consulting provider [place] or covering provider during after hours [place], for  this patient?  Check the care team in [facility] and look for a) attending/consulting TRH provider listed and b) the [facility] team listed Log into [email or web address] and use [place]'s universal password to access. If you do not have the password, please contact the hospital operator. Locate the [facility] provider you are looking for under [facility] and page to a number that you can be directly reached. If you still have difficulty reaching the provider, please page the [facility] (Director on Call) for the Hospitalists listed on amion for assistance.  04/14/2021, 1:43 PM

## 2021-04-14 NOTE — Progress Notes (Signed)
Abdomen distended. No urine and c/o pain in groin. Bladder scan done showing 812 cc. In/Out cath performed. 700 cc yellow, cloudy urine return in bag. Patient tolerated procedure well, states relief. Will continue to monitor.

## 2021-04-15 DIAGNOSIS — I10 Essential (primary) hypertension: Secondary | ICD-10-CM | POA: Diagnosis not present

## 2021-04-15 DIAGNOSIS — F339 Major depressive disorder, recurrent, unspecified: Secondary | ICD-10-CM | POA: Insufficient documentation

## 2021-04-15 DIAGNOSIS — M549 Dorsalgia, unspecified: Secondary | ICD-10-CM | POA: Insufficient documentation

## 2021-04-15 DIAGNOSIS — N179 Acute kidney failure, unspecified: Secondary | ICD-10-CM | POA: Diagnosis not present

## 2021-04-15 DIAGNOSIS — E782 Mixed hyperlipidemia: Secondary | ICD-10-CM | POA: Diagnosis not present

## 2021-04-15 DIAGNOSIS — R338 Other retention of urine: Secondary | ICD-10-CM

## 2021-04-15 DIAGNOSIS — R29818 Other symptoms and signs involving the nervous system: Secondary | ICD-10-CM | POA: Insufficient documentation

## 2021-04-15 DIAGNOSIS — N319 Neuromuscular dysfunction of bladder, unspecified: Secondary | ICD-10-CM | POA: Insufficient documentation

## 2021-04-15 DIAGNOSIS — R42 Dizziness and giddiness: Secondary | ICD-10-CM | POA: Insufficient documentation

## 2021-04-15 DIAGNOSIS — G9009 Other idiopathic peripheral autonomic neuropathy: Secondary | ICD-10-CM | POA: Insufficient documentation

## 2021-04-15 DIAGNOSIS — R252 Cramp and spasm: Secondary | ICD-10-CM | POA: Insufficient documentation

## 2021-04-15 DIAGNOSIS — J45909 Unspecified asthma, uncomplicated: Secondary | ICD-10-CM | POA: Insufficient documentation

## 2021-04-15 DIAGNOSIS — R339 Retention of urine, unspecified: Secondary | ICD-10-CM | POA: Diagnosis present

## 2021-04-15 LAB — CBC WITH DIFFERENTIAL/PLATELET
Abs Immature Granulocytes: 0.02 10*3/uL (ref 0.00–0.07)
Basophils Absolute: 0.1 10*3/uL (ref 0.0–0.1)
Basophils Relative: 1 %
Eosinophils Absolute: 0.6 10*3/uL — ABNORMAL HIGH (ref 0.0–0.5)
Eosinophils Relative: 9 %
HCT: 31.6 % — ABNORMAL LOW (ref 36.0–46.0)
Hemoglobin: 9.8 g/dL — ABNORMAL LOW (ref 12.0–15.0)
Immature Granulocytes: 0 %
Lymphocytes Relative: 53 %
Lymphs Abs: 3.5 10*3/uL (ref 0.7–4.0)
MCH: 28.1 pg (ref 26.0–34.0)
MCHC: 31 g/dL (ref 30.0–36.0)
MCV: 90.5 fL (ref 80.0–100.0)
Monocytes Absolute: 0.5 10*3/uL (ref 0.1–1.0)
Monocytes Relative: 8 %
Neutro Abs: 1.9 10*3/uL (ref 1.7–7.7)
Neutrophils Relative %: 29 %
Platelets: 284 10*3/uL (ref 150–400)
RBC: 3.49 MIL/uL — ABNORMAL LOW (ref 3.87–5.11)
RDW: 13.8 % (ref 11.5–15.5)
WBC: 6.6 10*3/uL (ref 4.0–10.5)
nRBC: 0 % (ref 0.0–0.2)

## 2021-04-15 LAB — RENAL FUNCTION PANEL
Albumin: 3 g/dL — ABNORMAL LOW (ref 3.5–5.0)
Anion gap: 5 (ref 5–15)
BUN: 21 mg/dL (ref 8–23)
CO2: 22 mmol/L (ref 22–32)
Calcium: 8.9 mg/dL (ref 8.9–10.3)
Chloride: 114 mmol/L — ABNORMAL HIGH (ref 98–111)
Creatinine, Ser: 1.14 mg/dL — ABNORMAL HIGH (ref 0.44–1.00)
GFR, Estimated: 50 mL/min — ABNORMAL LOW (ref >60)
Glucose, Bld: 135 mg/dL — ABNORMAL HIGH (ref 70–99)
Phosphorus: 2.3 mg/dL — ABNORMAL LOW (ref 2.5–4.6)
Potassium: 4 mmol/L (ref 3.5–5.1)
Sodium: 141 mmol/L (ref 135–145)

## 2021-04-15 MED ORDER — LOSARTAN POTASSIUM 25 MG PO TABS: 25.0000 mg | ORAL_TABLET | Freq: Every day | ORAL | Status: DC

## 2021-04-15 MED ORDER — POLYETHYLENE GLYCOL 3350 17 G PO PACK: 17.0000 g | PACK | Freq: Every day | ORAL | 0 refills | Status: DC | PRN

## 2021-04-15 MED ORDER — LINAGLIPTIN 5 MG PO TABS: 5.0000 mg | ORAL_TABLET | Freq: Every day | ORAL | Status: DC

## 2021-04-15 MED ORDER — FUROSEMIDE 20 MG PO TABS: 20.0000 mg | ORAL_TABLET | ORAL | Status: AC

## 2021-04-15 MED ORDER — VERAPAMIL HCL ER 180 MG PO TBCR: 180.0000 mg | EXTENDED_RELEASE_TABLET | Freq: Every day | ORAL | Status: DC

## 2021-04-15 MED ORDER — CHLORHEXIDINE GLUCONATE CLOTH 2 % EX PADS
6.0000 | MEDICATED_PAD | Freq: Every day | CUTANEOUS | Status: DC
  Administered 2021-04-15: 6 via TOPICAL

## 2021-04-15 NOTE — Care Management Important Message (Signed)
Important Message  Patient Details  Name: Stephanie Moreno MRN: [id number] Date of Birth: 09-Jun-1946   Medicare Important Message Given:  Yes     Tommy Medal 04/15/2021, 11:17 AM

## 2021-04-15 NOTE — Progress Notes (Signed)
Physical Therapy Treatment Patient Details Name: Stephanie Moreno MRN: [id number] DOB: Mar 14, 1946 Today's Date: 04/15/2021   History of Present Illness Stephanie Moreno is a 75 y.o. female with medical history of CKD stage III, diabetes mellitus type 2, hyperlipidemia, hypertension, iron deficiency, GERD presenting with generalized weakness.  The patient had recent back surgery on 03/18/2021 when she underwent a laminectomy L4-5 and L5-S1 with L5-S1 fusion.  The patient was discharged to [facility].  She returned home on 04/11/2021.  Apparently, the family was trying to get the patient up when she was so weak she slid and fell onto the ground.  As result, the patient was brought to the hospital for further evaluation.  The patient is difficult historian.  However she was able to answer simple questions.  She denies any fevers, chills, headache, neck pain, chest pain, shortness breath, nausea, vomiting, diarrhea, Donnell pain, dysuria, hematuria.    PT Comments    Patient agreeable and motivated for therapy.  Patient has difficulty sitting up at bedside with [facility] flat due to weakness and required the use of bed rails and Mod assist, demonstrates good return for completing BLE ROM/strengthening exercises with verbal cueing and demonstration, but unable to complete full sit to stands due to BLE weakness with buckling of knees after multiple attempts.  Patient demonstrates good return for using BUE/LE to help reposition self when put back to bed.  Patient will benefit from continued physical therapy in hospital and recommended venue below to increase strength, balance, endurance for safe ADLs and gait.     Recommendations for follow up therapy are one component of a multi-disciplinary discharge planning process, led by the attending physician.  Recommendations may be updated based on patient status, additional functional criteria and insurance authorization.  Follow Up Recommendations  SNF     Equipment  Recommendations  None recommended by PT    Recommendations for Other Services       Precautions / Restrictions Precautions Precautions: Fall Restrictions Weight Bearing Restrictions: No     Mobility  Bed Mobility Overal bed mobility: Needs Assistance Bed Mobility: Supine to Sit;Sit to Supine     Supine to sit: Mod assist Sit to supine: Min assist;Mod assist   General bed mobility comments: slow labored movement requiring use of bed rail with HOB flat    Transfers Overall transfer level: Needs assistance Equipment used: Rolling walker (2 wheeled) Transfers: Sit to/from Stand Sit to Stand: Max assist         General transfer comment: limited to partial standing only due to BLE weakness  Ambulation/Gait                 Stairs             Wheelchair Mobility    Modified Rankin (Stroke Patients Only)       Balance Overall balance assessment: Needs assistance Sitting-balance support: Feet supported;No upper extremity supported Sitting balance-Leahy Scale: Fair Sitting balance - Comments: seated EOB   Standing balance support: During functional activity;Bilateral upper extremity supported Standing balance-Leahy Scale: Zero Standing balance comment: poor/zero, unable to lock knees using RW                            Cognition Arousal/Alertness: Awake/alert Behavior During Therapy: WFL for tasks assessed/performed Overall Cognitive Status: Within Functional Limits for tasks assessed  Exercises General Exercises - Lower Extremity Long Arc Quad: Seated;AROM;Both;10 reps;Strengthening Hip Flexion/Marching: Seated;AROM;Both;10 reps;Strengthening Toe Raises: Seated;AROM;Both;10 reps;Strengthening Heel Raises: Seated;AROM;Both;10 reps;Strengthening    General Comments        Pertinent Vitals/Pain Pain Score: 8  Pain Location: stomach Pain Descriptors / Indicators:  Aching Pain Intervention(s): Limited activity within patient's tolerance;Monitored during session;Repositioned    Home Living                      Prior Function            PT Goals (current goals can now be found in the care plan section) Acute Rehab PT Goals Patient Stated Goal: Return home PT Goal Formulation: With patient Time For Goal Achievement: 04/27/21 Potential to Achieve Goals: Fair Progress towards PT goals: Progressing toward goals    Frequency    Min 3X/week      PT Plan Current plan remains appropriate    Co-evaluation              AM-PAC PT "6 Clicks" Mobility   Outcome Measure  Help needed turning from your back to your side while in a flat bed without using bedrails?: A Little Help needed moving from lying on your back to sitting on the side of a flat bed without using bedrails?: A Lot Help needed moving to and from a bed to a chair (including a wheelchair)?: Total Help needed standing up from a chair using your arms (e.g., wheelchair or bedside chair)?: A Lot Help needed to walk in hospital room?: Total Help needed climbing 3-5 steps with a railing? : Total 6 Click Score: 10    End of Session   Activity Tolerance: Patient tolerated treatment well;Patient limited by fatigue Patient left: in bed;with call bell/phone within reach Nurse Communication: Mobility status PT Visit Diagnosis: Unsteadiness on feet (R26.81);Other abnormalities of gait and mobility (R26.89);Muscle weakness (generalized) (M62.81);History of falling (Z91.81)     Time: 8832-5498 PT Time Calculation (min) (ACUTE ONLY): 27 min  Charges:  $Therapeutic Exercise: 8-22 mins $Therapeutic Activity: 8-22 mins                     1:53 PM, 04/15/21 Lonell Grandchild, MPT Physical Therapist with [facility] [phone] office [phone] mobile phone

## 2021-04-15 NOTE — Discharge Summary (Signed)
Physician Discharge Summary  [name] YIF:[id number] DOB: 08-15-1945 DOA: 04/11/2021  PCP: Margretta Sidle, MD  Admit date: 04/11/2021 Discharge date: 04/15/2021  Admitted From:  Home  Disposition: SNF   Recommendations for Outpatient Follow-up:  Follow up with [facility] in 1-2 weeks for foley removal and voiding trial  Please obtain BMP/CBC in 1-2 weeks to follow up renal function and Hg  Please consider ambulatory palliative care referral    Discharge Condition: STABLE   CODE STATUS: FULL  DIET:  Heart healthy carb modified diet recommended    Brief Hospitalization Summary: Please see all hospital notes, images, labs for full details of the hospitalization. ADMISSION HPI:  Stephanie Moreno is a 75 y.o. female with medical history of CKD stage III, diabetes mellitus type 2, hyperlipidemia, hypertension, iron deficiency, GERD presenting with generalized weakness.  The patient had recent back surgery on 03/18/2021 when she underwent a laminectomy L4-5 and L5-S1 with L5-S1 fusion.  The patient was discharged to [facility].  She returned home on 04/11/2021.  Apparently, the family was trying to get the patient up when she was so weak she slid and fell onto the ground.  As result, the patient was brought to the hospital for further evaluation.  The patient is difficult historian.  However she was able to answer simple questions.  She denies any fevers, chills, headache, neck pain, chest pain, shortness breath, nausea, vomiting, diarrhea, Donnell pain, dysuria, hematuria.   ED In the emergency department, the patient was afebrile with soft blood pressures.  Oxygen saturation was 100% room air.  She was given 2 L bolus NS and started on ceftriaxone. UA showed >50 WBC.  BMP showed sodium 139, potassium 5.2, serum creatinine 4.07.  LFTs were unremarkable.  WBC 9.5, hemoglobin 1.5, platelets 408  Lactic acid peaked at 1.8.   HOSPITAL COURSE   Patient was admitted with severe dehydration and  acute on chronic renal insufficiency complicated by a pretty severe urinary tract infection.  She was started on IV fluid hydration and IV ceftriaxone.  Urine cultures were indeterminate and ceftriaxone was discontinued after 3 doses.  For the AKI we initially held her blood pressure medicines due to soft blood pressures however after the blood pressure rebounded after being hydrated she was slowly restarted back on some of her home blood pressure medications.  She was restarted on metoprolol and when she discharges she will restart losartan 25 mg daily.  Also of note, patient had several episodes of acute urinary retention initially treated with a in and out catheterization however a Foley was placed prior to discharge.  We have made a referral for her to [facility]-up with [facility] in [place] for Foley removal and voiding trial in 1 to 2 weeks.  Patient has stabilized to her baseline her labs have stabilized and she is safe to discharge to SNF.  Recommend CBC and BMP be rechecked in 1 to 2 weeks.  She has outpatient follow-up with her endocrinologist and other specialists.    Discharge Diagnoses:  Active Problems:   Mixed hyperlipidemia   Essential hypertension, benign   Acute kidney injury ([place])   Acute renal failure superimposed on chronic kidney disease, unspecified CKD stage, unspecified acute renal failure type ([place])   Acute renal failure superimposed on stage 3b chronic kidney disease ([place])   Acute urinary retention   Discharge Instructions: Discharge Instructions     Ambulatory referral to Urology   Complete by: As directed    Hospital Follow up  for foley removal and voiding trial      Allergies as of 04/15/2021       Reactions   Codeine Nausea And Vomiting   Hydrochlorothiazide Other (See Comments)   Lisinopril Nausea And Vomiting   Oxybutynin Other (See Comments)   Penicillins Hives        Medication List     STOP taking these medications    amLODipine 5 MG  tablet Commonly known as: NORVASC   cyclobenzaprine 10 MG tablet Commonly known as: FLEXERIL   diphenhydrAMINE 25 MG tablet Commonly known as: BENADRYL   glipiZIDE 5 MG 24 hr tablet Commonly known as: GLUCOTROL XL   GLUCOSAMINE-CHONDROITIN PO   HYDROcodone-acetaminophen 10-325 MG tablet Commonly known as: NORCO   linaclotide 145 MCG Caps capsule Commonly known as: Linzess   oxybutynin 10 MG 24 hr tablet Commonly known as: DITROPAN-XL   phenazopyridine 200 MG tablet Commonly known as: PYRIDIUM   Tresiba FlexTouch 100 UNIT/ML FlexTouch Pen Generic drug: insulin degludec   Trulicity 3 YI/5.0YD Sopn Generic drug: Dulaglutide       TAKE these medications    amitriptyline 25 MG tablet Commonly known as: ELAVIL Take 25 mg by mouth at bedtime.   Arnuity Ellipta 200 MCG/ACT Aepb Generic drug: Fluticasone Furoate Inhale 1 puff into the lungs daily.   calcium-vitamin D 500-200 MG-UNIT Tabs tablet Commonly known as: OSCAL WITH D Take 1 tablet by mouth in the morning, at noon, and at bedtime.   docusate sodium 100 MG capsule Commonly known as: COLACE TAKE 1 CAPSULE BY MOUTH TWICE A DAY   fluticasone 50 MCG/ACT nasal spray Commonly known as: FLONASE Place 2 sprays into both nostrils daily as needed for allergies or rhinitis.   furosemide 20 MG tablet Commonly known as: LASIX Take 1 tablet (20 mg total) by mouth every other day. Takes 40 mg every other day. On alternate days take 80 mg Start taking on: April 18, 2021 What changed:  medication strength how much to take when to take this These instructions start on April 18, 2021. If you are unsure what to do until then, ask your doctor or other care provider.   gabapentin 300 MG capsule Commonly known as: NEURONTIN Take 1 capsule (300 mg total) by mouth at bedtime.   Lidocaine 4 % Ptch Place 1 patch onto the skin daily as needed (pain). Remove & Discard patch within 12 hours or as directed by MD. Applied to  lower back/shoulders/neck for pain   linagliptin 5 MG Tabs tablet Commonly known as: Tradjenta Take 1 tablet (5 mg total) by mouth daily.   losartan 25 MG tablet Commonly known as: COZAAR Take 1 tablet (25 mg total) by mouth daily. What changed:  medication strength how much to take   lubiprostone 8 MCG capsule Commonly known as: AMITIZA Take 8 mcg by mouth 2 (two) times daily with a meal.   magnesium oxide 400 MG tablet Commonly known as: MAG-OX Take 400 mg by mouth daily.   meclizine 25 MG tablet Commonly known as: ANTIVERT Take 25 mg by mouth 3 (three) times daily as needed for dizziness.   methocarbamol 500 MG tablet Commonly known as: ROBAXIN Take 1 tablet (500 mg total) by mouth every 6 (six) hours as needed for muscle spasms.   metoprolol tartrate 50 MG tablet Commonly known as: LOPRESSOR Take 50 mg by mouth 2 (two) times daily.   multivitamin with minerals tablet Take 1 tablet by mouth daily.   pantoprazole 40 MG tablet  Commonly known as: PROTONIX Take 1 tablet (40 mg total) by mouth daily. 30 minutes before breakfast   polyethylene glycol 17 g packet Commonly known as: MIRALAX / GLYCOLAX Take 17 g by mouth daily as needed for mild constipation. What changed:  when to take this reasons to take this Another medication with the same name was removed. Continue taking this medication, and follow the directions you see here.   rosuvastatin 10 MG tablet Commonly known as: CRESTOR Take 10 mg by mouth at bedtime.   sucralfate 1 g tablet Commonly known as: CARAFATE Take 1 g by mouth in the morning, at noon, and at bedtime.   verapamil 180 MG CR tablet Commonly known as: CALAN-SR Take 1 tablet (180 mg total) by mouth daily.        Contact information for follow-up providers     [place]. Schedule an appointment as soon as possible for a visit in 1 week(s).   Specialty: Urology Why: Hospital Follow Up for foley removal and voiding  trial Contact information: [phone]             Contact information for after-discharge care     [facility] .   Service: Skilled Nursing Contact information: [address] [place] [address] [phone]                    Allergies  Allergen Reactions   Codeine Nausea And Vomiting   Hydrochlorothiazide Other (See Comments)   Lisinopril Nausea And Vomiting   Oxybutynin Other (See Comments)   Penicillins Hives   Allergies as of 04/15/2021       Reactions   Codeine Nausea And Vomiting   Hydrochlorothiazide Other (See Comments)   Lisinopril Nausea And Vomiting   Oxybutynin Other (See Comments)   Penicillins Hives        Medication List     STOP taking these medications    amLODipine 5 MG tablet Commonly known as: NORVASC   cyclobenzaprine 10 MG tablet Commonly known as: FLEXERIL   diphenhydrAMINE 25 MG tablet Commonly known as: BENADRYL   glipiZIDE 5 MG 24 hr tablet Commonly known as: GLUCOTROL XL   GLUCOSAMINE-CHONDROITIN PO   HYDROcodone-acetaminophen 10-325 MG tablet Commonly known as: NORCO   linaclotide 145 MCG Caps capsule Commonly known as: Linzess   oxybutynin 10 MG 24 hr tablet Commonly known as: DITROPAN-XL   phenazopyridine 200 MG tablet Commonly known as: PYRIDIUM   Tresiba FlexTouch 100 UNIT/ML FlexTouch Pen Generic drug: insulin degludec   Trulicity 3 TK/1.6WF Sopn Generic drug: Dulaglutide       TAKE these medications    amitriptyline 25 MG tablet Commonly known as: ELAVIL Take 25 mg by mouth at bedtime.   Arnuity Ellipta 200 MCG/ACT Aepb Generic drug: Fluticasone Furoate Inhale 1 puff into the lungs daily.   calcium-vitamin D 500-200 MG-UNIT Tabs tablet Commonly known as: OSCAL WITH D Take 1 tablet by mouth in the morning, at noon, and at bedtime.   docusate sodium 100 MG capsule Commonly known as: COLACE TAKE 1  CAPSULE BY MOUTH TWICE A DAY   fluticasone 50 MCG/ACT nasal spray Commonly known as: FLONASE Place 2 sprays into both nostrils daily as needed for allergies or rhinitis.   furosemide 20 MG tablet Commonly known as: LASIX Take 1 tablet (20 mg total) by mouth every other day. Takes 40 mg every other day. On alternate days take 80  mg Start taking on: April 18, 2021 What changed:  medication strength how much to take when to take this These instructions start on April 18, 2021. If you are unsure what to do until then, ask your doctor or other care provider.   gabapentin 300 MG capsule Commonly known as: NEURONTIN Take 1 capsule (300 mg total) by mouth at bedtime.   Lidocaine 4 % Ptch Place 1 patch onto the skin daily as needed (pain). Remove & Discard patch within 12 hours or as directed by MD. Applied to lower back/shoulders/neck for pain   linagliptin 5 MG Tabs tablet Commonly known as: Tradjenta Take 1 tablet (5 mg total) by mouth daily.   losartan 25 MG tablet Commonly known as: COZAAR Take 1 tablet (25 mg total) by mouth daily. What changed:  medication strength how much to take   lubiprostone 8 MCG capsule Commonly known as: AMITIZA Take 8 mcg by mouth 2 (two) times daily with a meal.   magnesium oxide 400 MG tablet Commonly known as: MAG-OX Take 400 mg by mouth daily.   meclizine 25 MG tablet Commonly known as: ANTIVERT Take 25 mg by mouth 3 (three) times daily as needed for dizziness.   methocarbamol 500 MG tablet Commonly known as: ROBAXIN Take 1 tablet (500 mg total) by mouth every 6 (six) hours as needed for muscle spasms.   metoprolol tartrate 50 MG tablet Commonly known as: LOPRESSOR Take 50 mg by mouth 2 (two) times daily.   multivitamin with minerals tablet Take 1 tablet by mouth daily.   pantoprazole 40 MG tablet Commonly known as: PROTONIX Take 1 tablet (40 mg total) by mouth daily. 30 minutes before breakfast   polyethylene glycol 17 g  packet Commonly known as: MIRALAX / GLYCOLAX Take 17 g by mouth daily as needed for mild constipation. What changed:  when to take this reasons to take this Another medication with the same name was removed. Continue taking this medication, and follow the directions you see here.   rosuvastatin 10 MG tablet Commonly known as: CRESTOR Take 10 mg by mouth at bedtime.   sucralfate 1 g tablet Commonly known as: CARAFATE Take 1 g by mouth in the morning, at noon, and at bedtime.   verapamil 180 MG CR tablet Commonly known as: CALAN-SR Take 1 tablet (180 mg total) by mouth daily.        Procedures/Studies: DG Abd 1 View  Result Date: 03/17/2021 CLINICAL DATA:  Constipation, wheezing. EXAM: ABDOMEN - 1 VIEW COMPARISON:  Prior abdominal imaging from 2021. No recent comparison. FINDINGS: Moderate volume of stool in the rectum. Scattered gas-filled loops of large and small bowel throughout the abdomen, mild-to-moderately distended. Postoperative changes related to L5-S1 fusion with skin staples projecting over the spine related to spinal fusion. IMPRESSION: Moderate volume of stool in the rectum. Scattered gas-filled loops of large and small bowel throughout the abdomen, mild-to-moderately distended. Findings may reflect postoperative ileus, correlate with any signs of fecal impaction given the large stool ball in the area of the rectum. Electronically Signed   By: Zetta Bills M.D.   On: 03/17/2021 16:09   CT HEAD WO CONTRAST (5MM)  Result Date: 03/17/2021 CLINICAL DATA:  Neuro deficit, acute, stroke suspected EXAM: CT HEAD WITHOUT CONTRAST TECHNIQUE: Contiguous axial images were obtained from the base of the skull through the vertex without intravenous contrast. COMPARISON:  None. FINDINGS: Brain: Chronic small vessel disease throughout the deep white matter. No acute intracranial abnormality. Specifically, no hemorrhage, hydrocephalus,  mass lesion, acute infarction, or significant  intracranial injury. Vascular: No hyperdense vessel or unexpected calcification. Skull: No acute calvarial abnormality. Sinuses/Orbits: No acute findings Other: None IMPRESSION: Chronic microvascular ischemic changes throughout the deep white matter. No acute intracranial abnormality. Electronically Signed   By: Rolm Baptise M.D.   On: 03/17/2021 15:47   MR ANGIO HEAD WO CONTRAST  Result Date: 03/18/2021 CLINICAL DATA:  Neuro deficit, acute, stroke suspected; Transient ischemic attack (TIA) EXAM: MRI HEAD WITHOUT CONTRAST MRA HEAD WITHOUT CONTRAST MRA NECK WITHOUT CONTRAST TECHNIQUE: Multiplanar, multiecho pulse sequences of the brain and surrounding structures were obtained without intravenous contrast. Angiographic images of the Circle of Willis were obtained using MRA technique without intravenous contrast. Angiographic images of the neck were obtained using MRA technique without intravenous contrast. Carotid stenosis measurements (when applicable) are obtained utilizing NASCET criteria, using the distal internal carotid diameter as the denominator. COMPARISON:  None. FINDINGS: Motion artifact is present. MRI HEAD Brain: There is no acute infarction or intracranial hemorrhage. There is no intracranial mass, mass effect, or edema. There is no hydrocephalus or extra-axial fluid collection. Some foci of susceptibility are present in the right cerebellum compatible with chronic microhemorrhages. Patchy T2 hyperintensity in the supratentorial and pontine white matter is nonspecific but may reflect mild to moderate chronic microvascular ischemic changes. Vascular: Major vessel flow voids at the skull base are preserved. Skull and upper cervical spine: Normal marrow signal is preserved. Sinuses/Orbits: Paranasal sinuses are aerated. Orbits are unremarkable. Other: Sella is unremarkable.  Mastoid air cells are clear. MRA HEAD Intracranial internal carotid arteries are patent. Middle and anterior cerebral arteries are  patent. Intracranial vertebral arteries, basilar artery, posterior cerebral arteries are patent. Left posterior communicating artery is present. MRA NECK Visualized portions of the common, internal, and external carotid arteries are patent. Visualized portions of the codominant extracranial vertebral arteries are patent. No apparent hemodynamically significant stenosis. IMPRESSION: Motion degraded. No acute infarction, hemorrhage, or mass. Mild to moderate chronic microvascular ischemic changes. No large vessel occlusion or significant stenosis. Electronically Signed   By: Macy Mis M.D.   On: 03/18/2021 14:01   MR ANGIO NECK WO CONTRAST  Result Date: 03/18/2021 CLINICAL DATA:  Neuro deficit, acute, stroke suspected; Transient ischemic attack (TIA) EXAM: MRI HEAD WITHOUT CONTRAST MRA HEAD WITHOUT CONTRAST MRA NECK WITHOUT CONTRAST TECHNIQUE: Multiplanar, multiecho pulse sequences of the brain and surrounding structures were obtained without intravenous contrast. Angiographic images of the Circle of Willis were obtained using MRA technique without intravenous contrast. Angiographic images of the neck were obtained using MRA technique without intravenous contrast. Carotid stenosis measurements (when applicable) are obtained utilizing NASCET criteria, using the distal internal carotid diameter as the denominator. COMPARISON:  None. FINDINGS: Motion artifact is present. MRI HEAD Brain: There is no acute infarction or intracranial hemorrhage. There is no intracranial mass, mass effect, or edema. There is no hydrocephalus or extra-axial fluid collection. Some foci of susceptibility are present in the right cerebellum compatible with chronic microhemorrhages. Patchy T2 hyperintensity in the supratentorial and pontine white matter is nonspecific but may reflect mild to moderate chronic microvascular ischemic changes. Vascular: Major vessel flow voids at the skull base are preserved. Skull and upper cervical spine:  Normal marrow signal is preserved. Sinuses/Orbits: Paranasal sinuses are aerated. Orbits are unremarkable. Other: Sella is unremarkable.  Mastoid air cells are clear. MRA HEAD Intracranial internal carotid arteries are patent. Middle and anterior cerebral arteries are patent. Intracranial vertebral arteries, basilar artery, posterior cerebral arteries are patent. Left  posterior communicating artery is present. MRA NECK Visualized portions of the common, internal, and external carotid arteries are patent. Visualized portions of the codominant extracranial vertebral arteries are patent. No apparent hemodynamically significant stenosis. IMPRESSION: Motion degraded. No acute infarction, hemorrhage, or mass. Mild to moderate chronic microvascular ischemic changes. No large vessel occlusion or significant stenosis. Electronically Signed   By: Macy Mis M.D.   On: 03/18/2021 14:01   MR BRAIN WO CONTRAST  Result Date: 03/18/2021 CLINICAL DATA:  Neuro deficit, acute, stroke suspected; Transient ischemic attack (TIA) EXAM: MRI HEAD WITHOUT CONTRAST MRA HEAD WITHOUT CONTRAST MRA NECK WITHOUT CONTRAST TECHNIQUE: Multiplanar, multiecho pulse sequences of the brain and surrounding structures were obtained without intravenous contrast. Angiographic images of the Circle of Willis were obtained using MRA technique without intravenous contrast. Angiographic images of the neck were obtained using MRA technique without intravenous contrast. Carotid stenosis measurements (when applicable) are obtained utilizing NASCET criteria, using the distal internal carotid diameter as the denominator. COMPARISON:  None. FINDINGS: Motion artifact is present. MRI HEAD Brain: There is no acute infarction or intracranial hemorrhage. There is no intracranial mass, mass effect, or edema. There is no hydrocephalus or extra-axial fluid collection. Some foci of susceptibility are present in the right cerebellum compatible with chronic  microhemorrhages. Patchy T2 hyperintensity in the supratentorial and pontine white matter is nonspecific but may reflect mild to moderate chronic microvascular ischemic changes. Vascular: Major vessel flow voids at the skull base are preserved. Skull and upper cervical spine: Normal marrow signal is preserved. Sinuses/Orbits: Paranasal sinuses are aerated. Orbits are unremarkable. Other: Sella is unremarkable.  Mastoid air cells are clear. MRA HEAD Intracranial internal carotid arteries are patent. Middle and anterior cerebral arteries are patent. Intracranial vertebral arteries, basilar artery, posterior cerebral arteries are patent. Left posterior communicating artery is present. MRA NECK Visualized portions of the common, internal, and external carotid arteries are patent. Visualized portions of the codominant extracranial vertebral arteries are patent. No apparent hemodynamically significant stenosis. IMPRESSION: Motion degraded. No acute infarction, hemorrhage, or mass. Mild to moderate chronic microvascular ischemic changes. No large vessel occlusion or significant stenosis. Electronically Signed   By: Macy Mis M.D.   On: 03/18/2021 14:01   DG Chest Port 1 View  Result Date: 04/11/2021 CLINICAL DATA:  Questionable sepsis.  Evaluate for abnormality. EXAM: PORTABLE CHEST 1 VIEW COMPARISON:  03/17/2021 FINDINGS: Shallow inspiration with elevation of the right hemidiaphragm. Heart size and pulmonary vascularity are normal. Lungs are clear. No pleural effusions. No pneumothorax. Mediastinal contours appear intact. Calcification of the aorta. IMPRESSION: Shallow inspiration with elevation of right hemidiaphragm. No active pulmonary disease. Electronically Signed   By: Lucienne Capers M.D.   On: 04/11/2021 23:06   DG CHEST PORT 1 VIEW  Result Date: 03/17/2021 CLINICAL DATA:  Wheezing and shortness of breath. EXAM: PORTABLE CHEST 1 VIEW COMPARISON:  03/11/2021 FINDINGS: The cardiac silhouette,  mediastinal and hilar contours are within normal limits and stable given the AP projection and portable technique. Stable tortuosity and calcification of the thoracic aorta. Low lung volumes with vascular crowding and streaky basilar atelectasis but no infiltrates, edema or effusions. The bony thorax is intact. IMPRESSION: Low lung volumes with vascular crowding and streaky basilar atelectasis. Electronically Signed   By: Marijo Sanes M.D.   On: 03/17/2021 15:53     Subjective: Pt continued to have urinary retention and foley was placed overnight, says she feels relieved.   Discharge Exam: Vitals:   04/15/21 0500 04/15/21 0758  BP:  138/73   Pulse: 75   Resp: 18   Temp: 98.4 F (36.9 C)   SpO2: 98% 97%   Vitals:   04/14/21 2006 04/14/21 2315 04/15/21 0500 04/15/21 0758  BP:   138/73   Pulse:   75   Resp:   18   Temp:   98.4 F (36.9 C)   TempSrc:   Oral   SpO2: 95% 96% 98% 97%  Weight:      Height:       General exam: frail, elderly female, awake, alert, cooperative, NAD, Appears calm and comfortable. MMM.     Respiratory system: Clear to auscultation. Respiratory effort normal. Cardiovascular system: normal S1 & S2 heard. No JVD, murmurs, rubs, gallops or clicks. No pedal edema. Gastrointestinal system: Abdomen is mildly distended, soft and nontender. No organomegaly or masses felt. Normal bowel sounds heard. Central nervous system: Alert and oriented. No focal neurological deficits. Extremities: Symmetric 5 x 5 power. Skin: No rashes, lesions or ulcers Psychiatry: Judgement and insight appear poor. Mood & affect flat.    The results of significant diagnostics from this hospitalization (including imaging, microbiology, ancillary and laboratory) are listed below for reference.     Microbiology: Recent Results (from the past 240 hour(s))  Blood Culture (routine x 2)     Status: None (Preliminary result)   Collection Time: 04/11/21 10:48 PM   Specimen: BLOOD  Result Value  Ref Range Status   Specimen Description BLOOD BLOOD LEFT HAND  Final   Special Requests   Final    BOTTLES DRAWN AEROBIC AND ANAEROBIC Blood Culture adequate volume   Culture   Final    NO GROWTH 4 DAYS Performed at [facility], [address], [place], [place] [address]    Report Status PENDING  Incomplete  Blood Culture (routine x 2)     Status: None (Preliminary result)   Collection Time: 04/11/21 10:48 PM   Specimen: BLOOD  Result Value Ref Range Status   Specimen Description BLOOD RIGHT ANTECUBITAL  Final   Special Requests   Final    BOTTLES DRAWN AEROBIC AND ANAEROBIC Blood Culture adequate volume   Culture   Final    NO GROWTH 4 DAYS Performed at [facility], [address] [place], [place] [address]    Report Status PENDING  Incomplete  Resp Panel by RT-PCR (Flu A&B, Covid) Nasopharyngeal Swab     Status: None   Collection Time: 04/11/21 11:10 PM   Specimen: Nasopharyngeal Swab; Nasopharyngeal(NP) swabs in vial transport medium  Result Value Ref Range Status   SARS Coronavirus 2 by RT PCR NEGATIVE NEGATIVE Final    Comment: (NOTE) SARS-CoV-2 target nucleic acids are NOT DETECTED.  The SARS-CoV-2 RNA is generally detectable in upper respiratory specimens during the acute phase of infection. The lowest concentration of SARS-CoV-2 viral copies this assay can detect is 138 copies/mL. A negative result does not preclude SARS-Cov-2 infection and should not be used as the sole basis for treatment or other patient management decisions. A negative result may occur with  improper specimen collection/handling, submission of specimen other than nasopharyngeal swab, presence of viral mutation(s) within the areas targeted by this assay, and inadequate number of viral copies(<138 copies/mL). A negative result must be combined with clinical observations, patient history, and epidemiological information. The expected result is Negative.  Fact Sheet for Patients:   EntrepreneurPulse.com.au  Fact Sheet for Healthcare Providers:  IncredibleEmployment.be  This test is no t yet approved or cleared by the [place]  FDA and  has been authorized for detection and/or diagnosis of SARS-CoV-2 by FDA under an Emergency Use Authorization (EUA). This EUA will remain  in effect (meaning this test can be used) for the duration of the COVID-19 declaration under Section 564(b)(1) of the Act, 21 U.S.C.section 360bbb-3(b)(1), unless the authorization is terminated  or revoked sooner.       Influenza A by PCR NEGATIVE NEGATIVE Final   Influenza B by PCR NEGATIVE NEGATIVE Final    Comment: (NOTE) The Xpert Xpress SARS-CoV-2/FLU/RSV plus assay is intended as an aid in the diagnosis of influenza from Nasopharyngeal swab specimens and should not be used as a sole basis for treatment. Nasal washings and aspirates are unacceptable for Xpert Xpress SARS-CoV-2/FLU/RSV testing.  Fact Sheet for Patients: EntrepreneurPulse.com.au  Fact Sheet for Healthcare Providers: IncredibleEmployment.be  This test is not yet approved or cleared by the [place] FDA and has been authorized for detection and/or diagnosis of SARS-CoV-2 by FDA under an Emergency Use Authorization (EUA). This EUA will remain in effect (meaning this test can be used) for the duration of the COVID-19 declaration under Section 564(b)(1) of the Act, 21 U.S.C. section 360bbb-3(b)(1), unless the authorization is terminated or revoked.  Performed at [facility], [address] [place], [place] [address]   Urine Culture     Status: Abnormal   Collection Time: 04/12/21  3:24 AM   Specimen: In/Out Cath Urine  Result Value Ref Range Status   Specimen Description   Final    IN/OUT CATH URINE Performed at [facility], [address], [place], [place] [address]    Special Requests   Final    NONE Performed at [facility], [address], [place], [place] [address]    Culture MULTIPLE SPECIES PRESENT, SUGGEST RECOLLECTION (A)  Final   Report Status 04/13/2021 FINAL  Final  Resp Panel by RT-PCR (Flu A&B, Covid) Nasopharyngeal Swab     Status: None   Collection Time: 04/14/21  3:26 PM   Specimen: Nasopharyngeal Swab; Nasopharyngeal(NP) swabs in vial transport medium  Result Value Ref Range Status   SARS Coronavirus 2 by RT PCR NEGATIVE NEGATIVE Final    Comment: (NOTE) SARS-CoV-2 target nucleic acids are NOT DETECTED.  The SARS-CoV-2 RNA is generally detectable in upper respiratory specimens during the acute phase of infection. The lowest concentration of SARS-CoV-2 viral copies this assay can detect is 138 copies/mL. A negative result does not preclude SARS-Cov-2 infection and should not be used as the sole basis for treatment or other patient management decisions. A negative result may occur with  improper specimen collection/handling, submission of specimen other than nasopharyngeal swab, presence of viral mutation(s) within the areas targeted by this assay, and inadequate number of viral copies(<138 copies/mL). A negative result must be combined with clinical observations, patient history, and epidemiological information. The expected result is Negative.  Fact Sheet for Patients:  EntrepreneurPulse.com.au  Fact Sheet for Healthcare Providers:  IncredibleEmployment.be  This test is no t yet approved or cleared by the [place] FDA and  has been authorized for detection and/or diagnosis of SARS-CoV-2 by FDA under an Emergency Use Authorization (EUA). This EUA will remain  in effect (meaning this test can be used) for the duration of the COVID-19 declaration under Section 564(b)(1) of the Act, 21 U.S.C.section 360bbb-3(b)(1), unless the authorization is terminated  or revoked sooner.       Influenza A by PCR NEGATIVE NEGATIVE Final   Influenza B by PCR  NEGATIVE NEGATIVE  Final    Comment: (NOTE) The Xpert Xpress SARS-CoV-2/FLU/RSV plus assay is intended as an aid in the diagnosis of influenza from Nasopharyngeal swab specimens and should not be used as a sole basis for treatment. Nasal washings and aspirates are unacceptable for Xpert Xpress SARS-CoV-2/FLU/RSV testing.  Fact Sheet for Patients: EntrepreneurPulse.com.au  Fact Sheet for Healthcare Providers: IncredibleEmployment.be  This test is not yet approved or cleared by the [place] FDA and has been authorized for detection and/or diagnosis of SARS-CoV-2 by FDA under an Emergency Use Authorization (EUA). This EUA will remain in effect (meaning this test can be used) for the duration of the COVID-19 declaration under Section 564(b)(1) of the Act, 21 U.S.C. section 360bbb-3(b)(1), unless the authorization is terminated or revoked.  Performed at [facility], [address], [place], [place] [address]      Labs: BNP (last 3 results) Recent Labs    03/17/21 1630  BNP 43.3   Basic Metabolic Panel: Recent Labs  Lab 04/11/21 2248 04/13/21 0830 04/14/21 0653 04/15/21 0648  NA 129* 140 142 141  K 5.2* 4.0 3.9 4.0  CL 99 113* 114* 114*  CO2 20* 20* 22 22  GLUCOSE 198* 106* 137* 135*  BUN 85* 49* 31* 21  CREATININE 4.07* 1.86* 1.35* 1.14*  CALCIUM 9.0 8.5* 8.9 8.9  PHOS  --   --  2.3* 2.3*   Liver Function Tests: Recent Labs  Lab 04/11/21 2248 04/14/21 0653 04/15/21 0648  AST 27  --   --   ALT 20  --   --   ALKPHOS 83  --   --   BILITOT 0.5  --   --   PROT 9.2*  --   --   ALBUMIN 3.7 3.0* 3.0*   No results for input(s): LIPASE, AMYLASE in the last 168 hours. No results for input(s): AMMONIA in the last 168 hours. CBC: Recent Labs  Lab 04/11/21 2248 04/14/21 0653 04/15/21 0648  WBC 9.5 6.3 6.6  NEUTROABS 5.5 2.1 1.9  HGB 11.5* 10.1* 9.8*  HCT 36.2 33.4* 31.6*  MCV 88.9 89.8 90.5  PLT 408* 268 284   Cardiac  Enzymes: No results for input(s): CKTOTAL, CKMB, CKMBINDEX, TROPONINI in the last 168 hours. BNP: Invalid input(s): POCBNP CBG: Recent Labs  Lab 04/11/21 2217 04/12/21 0832 04/12/21 1600  GLUCAP 180* 112* 108*   D-Dimer No results for input(s): DDIMER in the last 72 hours. Hgb A1c No results for input(s): HGBA1C in the last 72 hours. Lipid Profile No results for input(s): CHOL, HDL, LDLCALC, TRIG, CHOLHDL, LDLDIRECT in the last 72 hours. Thyroid function studies No results for input(s): TSH, T4TOTAL, T3FREE, THYROIDAB in the last 72 hours.  Invalid input(s): FREET3 Anemia work up No results for input(s): VITAMINB12, FOLATE, FERRITIN, TIBC, IRON, RETICCTPCT in the last 72 hours. Urinalysis    Component Value Date/Time   COLORURINE AMBER (A) 04/12/2021 0324   APPEARANCEUR CLOUDY (A) 04/12/2021 0324   LABSPEC 1.010 04/12/2021 0324   PHURINE 6.0 04/12/2021 0324   GLUCOSEU NEGATIVE 04/12/2021 0324   HGBUR MODERATE (A) 04/12/2021 0324   BILIRUBINUR NEGATIVE 04/12/2021 0324   BILIRUBINUR negative 09/17/2020 1631   KETONESUR NEGATIVE 04/12/2021 0324   PROTEINUR 30 (A) 04/12/2021 0324   UROBILINOGEN 0.2 09/17/2020 1631   UROBILINOGEN 0.2 01/01/2007 1207   NITRITE POSITIVE (A) 04/12/2021 0324   LEUKOCYTESUR LARGE (A) 04/12/2021 0324   Sepsis Labs Invalid input(s): PROCALCITONIN,  WBC,  LACTICIDVEN Microbiology Recent Results (from the past 240 hour(s))  Blood Culture (routine x 2)     Status: None (Preliminary result)   Collection Time: 04/11/21 10:48 PM   Specimen: BLOOD  Result Value Ref Range Status   Specimen Description BLOOD BLOOD LEFT HAND  Final   Special Requests   Final    BOTTLES DRAWN AEROBIC AND ANAEROBIC Blood Culture adequate volume   Culture   Final    NO GROWTH 4 DAYS Performed at [facility], [address], [place], [place] [address]    Report Status PENDING  Incomplete  Blood Culture (routine x 2)     Status: None (Preliminary result)   Collection  Time: 04/11/21 10:48 PM   Specimen: BLOOD  Result Value Ref Range Status   Specimen Description BLOOD RIGHT ANTECUBITAL  Final   Special Requests   Final    BOTTLES DRAWN AEROBIC AND ANAEROBIC Blood Culture adequate volume   Culture   Final    NO GROWTH 4 DAYS Performed at [facility], [address], [place], [place] [address]    Report Status PENDING  Incomplete  Resp Panel by RT-PCR (Flu A&B, Covid) Nasopharyngeal Swab     Status: None   Collection Time: 04/11/21 11:10 PM   Specimen: Nasopharyngeal Swab; Nasopharyngeal(NP) swabs in vial transport medium  Result Value Ref Range Status   SARS Coronavirus 2 by RT PCR NEGATIVE NEGATIVE Final    Comment: (NOTE) SARS-CoV-2 target nucleic acids are NOT DETECTED.  The SARS-CoV-2 RNA is generally detectable in upper respiratory specimens during the acute phase of infection. The lowest concentration of SARS-CoV-2 viral copies this assay can detect is 138 copies/mL. A negative result does not preclude SARS-Cov-2 infection and should not be used as the sole basis for treatment or other patient management decisions. A negative result may occur with  improper specimen collection/handling, submission of specimen other than nasopharyngeal swab, presence of viral mutation(s) within the areas targeted by this assay, and inadequate number of viral copies(<138 copies/mL). A negative result must be combined with clinical observations, patient history, and epidemiological information. The expected result is Negative.  Fact Sheet for Patients:  EntrepreneurPulse.com.au  Fact Sheet for Healthcare Providers:  IncredibleEmployment.be  This test is no t yet approved or cleared by the [place] FDA and  has been authorized for detection and/or diagnosis of SARS-CoV-2 by FDA under an Emergency Use Authorization (EUA). This EUA will remain  in effect (meaning this test can be used) for the duration of  the COVID-19 declaration under Section 564(b)(1) of the Act, 21 U.S.C.section 360bbb-3(b)(1), unless the authorization is terminated  or revoked sooner.       Influenza A by PCR NEGATIVE NEGATIVE Final   Influenza B by PCR NEGATIVE NEGATIVE Final    Comment: (NOTE) The Xpert Xpress SARS-CoV-2/FLU/RSV plus assay is intended as an aid in the diagnosis of influenza from Nasopharyngeal swab specimens and should not be used as a sole basis for treatment. Nasal washings and aspirates are unacceptable for Xpert Xpress SARS-CoV-2/FLU/RSV testing.  Fact Sheet for Patients: EntrepreneurPulse.com.au  Fact Sheet for Healthcare Providers: IncredibleEmployment.be  This test is not yet approved or cleared by the [place] FDA and has been authorized for detection and/or diagnosis of SARS-CoV-2 by FDA under an Emergency Use Authorization (EUA). This EUA will remain in effect (meaning this test can be used) for the duration of the COVID-19 declaration under Section 564(b)(1) of the Act, 21 U.S.C. section 360bbb-3(b)(1), unless the authorization is terminated or revoked.  Performed at [facility], [phone]  [address] [place], [place] [address]   Urine Culture     Status: Abnormal   Collection Time: 04/12/21  3:24 AM   Specimen: In/Out Cath Urine  Result Value Ref Range Status   Specimen Description   Final    IN/OUT CATH URINE Performed at [facility], [address] [place], [place] [address]    Special Requests   Final    NONE Performed at [facility], [address], [place], [place] [address]    Culture MULTIPLE SPECIES PRESENT, SUGGEST RECOLLECTION (A)  Final   Report Status 04/13/2021 FINAL  Final  Resp Panel by RT-PCR (Flu A&B, Covid) Nasopharyngeal Swab     Status: None   Collection Time: 04/14/21  3:26 PM   Specimen: Nasopharyngeal Swab; Nasopharyngeal(NP) swabs in vial transport medium  Result Value Ref Range Status   SARS Coronavirus 2 by  RT PCR NEGATIVE NEGATIVE Final    Comment: (NOTE) SARS-CoV-2 target nucleic acids are NOT DETECTED.  The SARS-CoV-2 RNA is generally detectable in upper respiratory specimens during the acute phase of infection. The lowest concentration of SARS-CoV-2 viral copies this assay can detect is 138 copies/mL. A negative result does not preclude SARS-Cov-2 infection and should not be used as the sole basis for treatment or other patient management decisions. A negative result may occur with  improper specimen collection/handling, submission of specimen other than nasopharyngeal swab, presence of viral mutation(s) within the areas targeted by this assay, and inadequate number of viral copies(<138 copies/mL). A negative result must be combined with clinical observations, patient history, and epidemiological information. The expected result is Negative.  Fact Sheet for Patients:  EntrepreneurPulse.com.au  Fact Sheet for Healthcare Providers:  IncredibleEmployment.be  This test is no t yet approved or cleared by the [place] FDA and  has been authorized for detection and/or diagnosis of SARS-CoV-2 by FDA under an Emergency Use Authorization (EUA). This EUA will remain  in effect (meaning this test can be used) for the duration of the COVID-19 declaration under Section 564(b)(1) of the Act, 21 U.S.C.section 360bbb-3(b)(1), unless the authorization is terminated  or revoked sooner.       Influenza A by PCR NEGATIVE NEGATIVE Final   Influenza B by PCR NEGATIVE NEGATIVE Final    Comment: (NOTE) The Xpert Xpress SARS-CoV-2/FLU/RSV plus assay is intended as an aid in the diagnosis of influenza from Nasopharyngeal swab specimens and should not be used as a sole basis for treatment. Nasal washings and aspirates are unacceptable for Xpert Xpress SARS-CoV-2/FLU/RSV testing.  Fact Sheet for Patients: EntrepreneurPulse.com.au  Fact Sheet  for Healthcare Providers: IncredibleEmployment.be  This test is not yet approved or cleared by the [place] FDA and has been authorized for detection and/or diagnosis of SARS-CoV-2 by FDA under an Emergency Use Authorization (EUA). This EUA will remain in effect (meaning this test can be used) for the duration of the COVID-19 declaration under Section 564(b)(1) of the Act, 21 U.S.C. section 360bbb-3(b)(1), unless the authorization is terminated or revoked.  Performed at [facility], [address] [name], [place] [address]     Time coordinating discharge: 38 minutes    SIGNED:  Irwin Brakeman, MD  [facility] 04/15/2021, 11:08 AM How to contact the [facility] Attending or Consulting provider [place] or covering provider during after hours [place], for this patient?  Check the care team in [facility] and look for a) attending/consulting TR[facility] provider listed and b) the [facility] team listed Log into [email or web address] and use [place]  universal password to access. If you do not have the password, please contact the hospital operator. Locate the [facility] provider you are looking for under [facility] and page to a number that you can be directly reached. If you still have difficulty reaching the provider, please page the [facility] (Director on Call) for the Hospitalists listed on amion for assistance.

## 2021-04-15 NOTE — TOC Transition Note (Signed)
Transition of Care [facility]) - CM/SW Discharge Note   Patient Details  Name: TANEISHA FUSON MRN: [id number] Date of Birth: Sep 08, 1945  Transition of Care [facility] CM/SW Contact:  Ihor Gully, LCSW Phone Number: 04/15/2021, 11:50 AM   Clinical Narrative:    Patient's daughter, [name], advised of discharge. [name] indicates that she was aware of the $1361.60 requirement to go to [place]; however she was not in agreement to pay this amount. She advised that she spoke with the insurance company and was advised that she did not have to pay. [name] states that she is going to call [employer] and have them contact me.    Final next level of care: Skilled Nursing Facility Barriers to Discharge: Other (must enter comment)   Patient Goals and CMS Choice Patient states their goals for this hospitalization and ongoing recovery are:: daughter wants mother to go to another rehabilitation faclity.      Discharge Placement              Patient chooses bed at: Other - please specify in the comment section below: ([place]) Patient to be transferred to facility by: [place] Name of family member notified: [name] Patient and family notified of of transfer: 04/15/21  Discharge Plan and Services                                     Social Determinants of Health (SDOH) Interventions     Readmission Risk Interventions No flowsheet data found.

## 2021-04-15 NOTE — Discharge Instructions (Signed)
Please schedule appointment with [facility] for foley removal and voiding trial in 1-2 weeks.     IMPORTANT INFORMATION: PAY CLOSE ATTENTION   PHYSICIAN DISCHARGE INSTRUCTIONS  Follow with Primary care provider  Margretta Sidle, MD  and other consultants as instructed by your Hospitalist Physician  [place] IF SYMPTOMS COME BACK, WORSEN OR NEW PROBLEM DEVELOPS   Please note: You were cared for by a hospitalist during your hospital stay. Every effort will be made to forward records to your primary care provider.  You can request that your primary care provider send for your hospital records if they have not received them.  Once you are discharged, your primary care physician will handle any further medical issues. Please note that NO REFILLS for any discharge medications will be authorized once you are discharged, as it is imperative that you return to your primary care physician (or establish a relationship with a primary care physician if you do not have one) for your post hospital discharge needs so that they can reassess your need for medications and monitor your lab values.  Please get a complete blood count and chemistry panel checked by your Primary MD at your next visit, and again as instructed by your Primary MD.  Get Medicines reviewed and adjusted: Please take all your medications with you for your next visit with your Primary MD  Laboratory/radiological data: Please request your Primary MD to go over all hospital tests and procedure/radiological results at the follow up, please ask your primary care provider to get all Hospital records sent to his/her office.  In some cases, they will be blood work, cultures and biopsy results pending at the time of your discharge. Please request that your primary care provider follow up on these results.  If you are diabetic, please bring your blood sugar readings with you to your follow up appointment  with primary care.    Please call and make your follow up appointments as soon as possible.    Also Note the following: If you experience worsening of your admission symptoms, develop shortness of breath, life threatening emergency, suicidal or homicidal thoughts you must seek medical attention immediately by calling 911 or calling your MD immediately  if symptoms less severe.  You must read complete instructions/literature along with all the possible adverse reactions/side effects for all the Medicines you take and that have been prescribed to you. Take any new Medicines after you have completely understood and accpet all the possible adverse reactions/side effects.   Do not drive when taking Pain medications or sleeping medications (Benzodiazepines)  Do not take more than prescribed Pain, Sleep and Anxiety Medications. It is not advisable to combine anxiety,sleep and pain medications without talking with your primary care practitioner  Special Instructions: If you have smoked or chewed Tobacco  in the last 2 yrs please stop smoking, stop any regular Alcohol  and or any Recreational drug use.  Wear Seat belts while driving.  Do not drive if taking any narcotic, mind altering or controlled substances or recreational drugs or alcohol.

## 2021-04-15 NOTE — Telephone Encounter (Signed)
I called and spoke with Stephanie Moreno, she states that the patient was discharged from to facility and the pt was not able to walk, which Stephanie Moreno was not made aware of and so Stephanie Moreno got her home and called EMS to take her to [facility], the patient is to be discharged to a different Facility that would work with her on rehab.  Pt has appt 10/27 with [place] to f/u postop. I advised Stephanie Moreno that once they are discharging patient from that facility that they should be the ones that would setup the home health for her. I advised that they will need to make sure that this has been done prior to her leaving the facility so that it will be in order when she gets home. Stephanie Moreno states that she understands and she will make sure the new facility is aware of her appt on 04/28/21.

## 2021-04-16 LAB — CULTURE, BLOOD (ROUTINE X 2)
Culture: NO GROWTH
Culture: NO GROWTH
Special Requests: ADEQUATE
Special Requests: ADEQUATE

## 2021-04-17 DIAGNOSIS — R279 Unspecified lack of coordination: Secondary | ICD-10-CM | POA: Insufficient documentation

## 2021-04-18 DIAGNOSIS — R262 Difficulty in walking, not elsewhere classified: Secondary | ICD-10-CM | POA: Insufficient documentation

## 2021-04-18 DIAGNOSIS — R41841 Cognitive communication deficit: Secondary | ICD-10-CM | POA: Insufficient documentation

## 2021-04-19 DIAGNOSIS — M4326 Fusion of spine, lumbar region: Secondary | ICD-10-CM

## 2021-04-19 DIAGNOSIS — M4316 Spondylolisthesis, lumbar region: Secondary | ICD-10-CM

## 2021-04-19 DIAGNOSIS — N183 Chronic kidney disease, stage 3 unspecified: Secondary | ICD-10-CM | POA: Insufficient documentation

## 2021-04-19 LAB — BASIC METABOLIC PANEL
BUN: 17 (ref 4–21)
CO2: 21 (ref 13–22)
Chloride: 110 — AB (ref 99–108)
Creatinine: 1.1 (ref <=1.1)
Glucose: 115
Potassium: 4.1 (ref 3.4–5.3)
Sodium: 138 (ref 137–147)

## 2021-04-19 LAB — CBC AND DIFFERENTIAL
HCT: 32 — AB (ref 36–46)
Hemoglobin: 10.3 — AB (ref 12.0–16.0)
Platelets: 211 (ref 150–399)
WBC: 6.2

## 2021-04-19 LAB — COMPREHENSIVE METABOLIC PANEL: Calcium: 9.4 (ref 8.7–10.7)

## 2021-04-19 LAB — HEMOGLOBIN A1C: Hemoglobin A1C: 7.2

## 2021-04-19 LAB — LIPID PANEL
Cholesterol: 91 (ref 0–200)
HDL: 42 (ref 35–70)
LDL Cholesterol: 34
Triglycerides: 78 (ref 40–160)

## 2021-04-19 LAB — CBC: RBC: 3.74 — AB (ref 3.87–5.11)

## 2021-04-27 ENCOUNTER — Other Ambulatory Visit: Payer: Self-pay

## 2021-04-27 ENCOUNTER — Ambulatory Visit (INDEPENDENT_AMBULATORY_CARE_PROVIDER_SITE_OTHER): Payer: Medicare Other | Admitting: Urology

## 2021-04-27 ENCOUNTER — Encounter: Payer: Self-pay | Admitting: Urology

## 2021-04-27 VITALS — BP 130/82 | HR 72 | Ht 63.0 in | Wt 188.0 lb

## 2021-04-27 DIAGNOSIS — R339 Retention of urine, unspecified: Secondary | ICD-10-CM | POA: Diagnosis not present

## 2021-04-27 NOTE — Progress Notes (Signed)
Assessment: 1. Urinary retention     Plan: I reviewed the patient's records from her recent hospitalization. The cause of her urinary retention is unclear but may have been related to her decreased mobility and deconditioning.  There may be some association with her recent lumbar laminectomy as well. I think it be reasonable to remove the Foley catheter and see if she is able to void spontaneously. Remove Foley catheter in the morning on 04/28/2021. Nursing staff to perform in and out catheterize patient every 6-8 hours if unable to void. Cefdinir 300 mg PO BID x 5 days following catheter removal Return to office in 7-10 days.  Chief Complaint:  Chief Complaint  Patient presents with   Urinary Retention    History of Present Illness:  Stephanie Moreno is a 75 y.o. year old female who is seen in consultation from Margretta Sidle, MD  for evaluation of urinary retention.  She was recently admitted to the hospital on 04/11/2021 for generalized weakness.  She underwent a lumbar laminectomy in September 2022.  After returning home she developed significant weakness.  She was admitted with severe dehydration and acute on chronic renal insufficiency.  Urinalysis showed positive nitrite, >50 RBCs, >50 WBCs, and many bacteria.  Urine culture grew multiple species.  She did have problems with urinary retention while in the hospital.  She required in and out catheterization and eventually Foley catheter was placed prior to discharge. Her Foley has been draining well.  No gross hematuria. No prior history of urinary retention.  She denies any problems voiding prior to her hospitalization. She does report some problems with constipation.  She is currently at [facility] and [facility] in [place].  Past Medical History:  Past Medical History:  Diagnosis Date   Acid reflux disease    Anemia    Anxiety    Asthma    Breast cancer ([place])    left side   Chronic back pain    Chronic kidney disease     Diabetes mellitus without complication ([place])    Diverticulitis    Hypertension    Lumbosacral spondylosis    Per previous PCP records   Sleep apnea    Thyroid disease     Past Surgical History:  Past Surgical History:  Procedure Laterality Date   ABDOMINAL HYSTERECTOMY     BACK SURGERY     BIOPSY  02/19/2020   Procedure: BIOPSY;  Surgeon: Eloise Harman, DO;  [facility];  Service: Endoscopy;;   BREAST LUMPECTOMY Left 2003   BREAST SURGERY     COLONOSCOPY WITH PROPOFOL N/A 02/19/2020   non-bleeding internal hemorrhoids, many small-mouthed diverticula in entire colon.   ESOPHAGOGASTRODUODENOSCOPY (EGD) WITH PROPOFOL N/A 02/19/2020    benign-appearing esophageal stenosis s/p dilation, gastritis s/p biopsy, normal duodenum. Negative H.pylori, negative duodenal biopsy.    GIVENS CAPSULE STUDY N/A 06/14/2020   Procedure: GIVENS CAPSULE STUDY;  Surgeon: Eloise Harman, DO;  [facility];  Service: Endoscopy;  Laterality: N/A;  7:30am   THYROIDECTOMY     tummy tuck     at time of breast surgery 2003    Allergies:  Allergies  Allergen Reactions   Codeine Nausea And Vomiting   Hydrochlorothiazide Other (See Comments)   Lisinopril Nausea And Vomiting   Oxybutynin Other (See Comments)   Penicillins Hives    Family History:  Family History  Problem Relation Age of Onset   Diabetes Mother    Thyroid disease Mother  Colon cancer Neg Hx     Social History:  Social History   Tobacco Use   Smoking status: Never   Smokeless tobacco: Never  Vaping Use   Vaping Use: Never used  Substance Use Topics   Alcohol use: Not Currently   Drug use: Not Currently    Review of symptoms:  Constitutional:  Negative for unexplained weight loss, night sweats, fever, chills ENT:  Negative for nose bleeds, sinus pain, painful swallowing CV:  Negative for chest pain, shortness of breath, exercise intolerance, palpitations, loss of consciousness Resp:   Negative for cough, wheezing, shortness of breath GI:  Negative for nausea, vomiting, diarrhea, bloody stools GU:  Positives noted in HPI; otherwise negative for gross hematuria, dysuria, urinary incontinence Neuro:  Negative for seizures, poor balance, limb weakness, slurred speech Psych:  Negative for lack of energy, depression, anxiety Endocrine:  Negative for polydipsia, polyuria, symptoms of hypoglycemia (dizziness, hunger, sweating) Hematologic:  Negative for anemia, purpura, petechia, prolonged or excessive bleeding, use of anticoagulants  Allergic:  Negative for difficulty breathing or choking as a result of exposure to anything; no shellfish allergy; no allergic response (rash/itch) to materials, foods  Physical exam: BP 130/82   Pulse 72   Ht 5\' 3"  (1.6 m)   Wt 188 lb (85.3 kg)   BMI 33.30 kg/m  GENERAL APPEARANCE:  Well appearing, well developed, well nourished, NAD HEENT: Atraumatic, Normocephalic, oropharynx clear. NECK: Supple without lymphadenopathy or thyromegaly. LUNGS: Clear to auscultation bilaterally. HEART: Regular Rate and Rhythm without murmurs, gallops, or rubs. ABDOMEN: Soft, non-tender, No Masses. EXTREMITIES: Without clubbing, cyanosis, or edema. NEUROLOGIC:  Alert and oriented x 3, in wheelchair, CN II-XII grossly intact.  MENTAL STATUS:  Appropriate. BACK:  Non-tender to palpation.  No CVAT SKIN:  Warm, dry and intact. GU:  foley draining clear urine    Results: None

## 2021-04-27 NOTE — Progress Notes (Signed)
Urological Symptom Review  Patient is experiencing the following symptoms: Weak stream   Review of Systems  Gastrointestinal (upper)  : Nausea Indigestion/heartburn  Gastrointestinal (lower) : Diarrhea Constipation  Constitutional : Night Sweats Weight loss  Skin: Negative for skin symptoms  Eyes: Blurred vision  Ear/Nose/Throat : Sinus problems  Hematologic/Lymphatic: Negative for Hematologic/Lymphatic symptoms  Cardiovascular : Leg swelling  Respiratory : Shortness of breath  Endocrine: Negative for endocrine symptoms  Musculoskeletal: Back pain  Neurological: Headaches Dizziness  Psychologic: Negative for psychiatric symptoms

## 2021-04-28 ENCOUNTER — Encounter: Payer: Medicare Other | Admitting: Specialist

## 2021-05-03 ENCOUNTER — Ambulatory Visit (INDEPENDENT_AMBULATORY_CARE_PROVIDER_SITE_OTHER): Payer: Medicare Other | Admitting: Gastroenterology

## 2021-05-03 ENCOUNTER — Encounter: Payer: Self-pay | Admitting: Gastroenterology

## 2021-05-03 ENCOUNTER — Other Ambulatory Visit: Payer: Self-pay

## 2021-05-03 VITALS — BP 177/105 | HR 86 | Temp 97.1°F | Ht 63.0 in | Wt 185.0 lb

## 2021-05-03 DIAGNOSIS — K5900 Constipation, unspecified: Secondary | ICD-10-CM | POA: Diagnosis not present

## 2021-05-03 DIAGNOSIS — K219 Gastro-esophageal reflux disease without esophagitis: Secondary | ICD-10-CM | POA: Diagnosis not present

## 2021-05-03 NOTE — Progress Notes (Signed)
Referring Provider: Ailene Ards, NP Primary Care Physician:  Margretta Sidle, MD Primary GI: Dr. Abbey Chatters  Chief Complaint  Patient presents with   Anemia   Gastroesophageal Reflux    occ    HPI:   Stephanie Moreno is a 75 y.o. female presenting today with a history of abdominal pain and nausea, constipation, GERD, previously established with GI in [place] but moved to this area in June 2021. Colonoscopy and EGD completed recently with internal hemorrhoids on colonoscopy and benign-appearing esophageal stenosis s/p dilation, gastritis s/p biopsy, normal duodenum. Negative H.pylori, negative duodenal biopsy. Gallbladder remains in situ. Due to mildly dilated CBD on CT, MRI was completed which showed mild dilation of CBD measuring up to 45mm, likely age-related. Fatty liver. Mildly elevated AST, isolated elevation. Negative Hep B surface antigen and Hep C antibody, no iron overload, component of IDA noted. Anemia multifactorial in setting of chronic disease. Due to worsening IDA, she underwent capsule study with few non-bleeding erosions in small bowl, single polypoid type lesion that did not apper significant.    Constipation: Amitiza 8 mcg po BID. Miralax 17 grams daily. Colace BID. BMs are soft. Having issues with UTI. Denies diarrhea. No overt GI bleeding.   GERD: Protonix daily.  In wheelchair currently. Resides at a facility. Hopeful she can continue PT and go home in the future.   Past Medical History:  Diagnosis Date   Acid reflux disease    Anemia    Anxiety    Asthma    Breast cancer ([place])    left side   Chronic back pain    Chronic kidney disease    Diabetes mellitus without complication ([place])    Diverticulitis    Hypertension    Lumbosacral spondylosis    Per previous PCP records   Sleep apnea    Thyroid disease     Past Surgical History:  Procedure Laterality Date   ABDOMINAL HYSTERECTOMY     BACK SURGERY     BIOPSY  02/19/2020   Procedure: BIOPSY;   Surgeon: Eloise Harman, DO;  [facility];  Service: Endoscopy;;   BREAST LUMPECTOMY Left 2003   BREAST SURGERY     COLONOSCOPY WITH PROPOFOL N/A 02/19/2020   non-bleeding internal hemorrhoids, many small-mouthed diverticula in entire colon.   ESOPHAGOGASTRODUODENOSCOPY (EGD) WITH PROPOFOL N/A 02/19/2020    benign-appearing esophageal stenosis s/p dilation, gastritis s/p biopsy, normal duodenum. Negative H.pylori, negative duodenal biopsy.    GIVENS CAPSULE STUDY N/A 06/14/2020   Procedure: GIVENS CAPSULE STUDY;  Surgeon: Eloise Harman, DO;  [facility];  Service: Endoscopy;  Laterality: N/A;  7:30am   THYROIDECTOMY     tummy tuck     at time of breast surgery 2003    Current Outpatient Medications  Medication Sig Dispense Refill   acetaminophen (TYLENOL) 325 MG tablet Take 650 mg by mouth every 4 (four) hours as needed.     Alogliptin Benzoate 25 MG TABS Take 25 mg by mouth daily.     amitriptyline (ELAVIL) 25 MG tablet Take 25 mg by mouth at bedtime.     calcium-vitamin D (OSCAL WITH D) 500-200 MG-UNIT TABS tablet Take 1 tablet by mouth 3 (three) times daily.     docusate sodium (COLACE) 100 MG capsule TAKE 1 CAPSULE BY MOUTH TWICE A DAY 60 capsule 3   fluticasone (FLONASE) 50 MCG/ACT nasal spray Place 2 sprays into both nostrils daily.     Fluticasone Furoate (ARNUITY ELLIPTA)  200 MCG/ACT AEPB Inhale 1 puff into the lungs daily.     furosemide (LASIX) 20 MG tablet Take 1 tablet (20 mg total) by mouth every other day. Takes 40 mg every other day. On alternate days take 80 mg     gabapentin (NEURONTIN) 300 MG capsule Take 1 capsule (300 mg total) by mouth at bedtime. (Patient taking differently: Take 300 mg by mouth 2 (two) times daily.) 30 capsule 3   linagliptin (TRADJENTA) 5 MG TABS tablet Take 1 tablet (5 mg total) by mouth daily. 30 tablet    losartan (COZAAR) 25 MG tablet Take 1 tablet (25 mg total) by mouth daily.     lubiprostone (AMITIZA) 8 MCG  capsule Take 8 mcg by mouth 2 (two) times daily with a meal.     magnesium oxide (MAG-OX) 400 MG tablet Take 400 mg by mouth daily.     meclizine (ANTIVERT) 25 MG tablet Take 25 mg by mouth 3 (three) times daily as needed for dizziness.     methocarbamol (ROBAXIN) 500 MG tablet Take 1 tablet (500 mg total) by mouth every 6 (six) hours as needed for muscle spasms. 30 tablet 1   metoprolol tartrate (LOPRESSOR) 50 MG tablet Take 50 mg by mouth 2 (two) times daily.     Multiple Vitamins-Minerals (MULTIVITAMIN WITH MINERALS) tablet Take 1 tablet by mouth daily.     pantoprazole (PROTONIX) 40 MG tablet Take 1 tablet (40 mg total) by mouth daily. 30 minutes before breakfast 90 tablet 3   polyethylene glycol (MIRALAX / GLYCOLAX) 17 g packet Take 17 g by mouth daily as needed for mild constipation. (Patient taking differently: Take 17 g by mouth daily.) 14 each 0   rosuvastatin (CRESTOR) 10 MG tablet Take 10 mg by mouth at bedtime.      sucralfate (CARAFATE) 1 g tablet Take 1 g by mouth 3 (three) times daily.     verapamil (CALAN-SR) 180 MG CR tablet Take 1 tablet (180 mg total) by mouth daily.     No current facility-administered medications for this visit.    Allergies as of 05/03/2021 - Review Complete 04/27/2021  Allergen Reaction Noted   Codeine Nausea And Vomiting 12/18/2019   Hydrochlorothiazide Other (See Comments) 12/18/2019   Lisinopril Nausea And Vomiting 12/18/2019   Oxybutynin Other (See Comments) 12/18/2019   Penicillins Hives 01/05/2020    Family History  Problem Relation Age of Onset   Diabetes Mother    Thyroid disease Mother    Colon cancer Neg Hx     Social History   Socioeconomic History   Marital status: Divorced    Spouse name: Not on file   Number of children: Not on file   Years of education: Not on file   Highest education level: Not on file  Occupational History   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 [place]  Tobacco Use   Smoking  status: Never   Smokeless tobacco: Never  Vaping Use   Vaping Use: Never used  Substance and Sexual Activity   Alcohol use: Not Currently   Drug use: Not Currently   Sexual activity: Not on file  Other Topics Concern   Not on file  Social History Narrative   Not on file   Social Determinants of Health   Financial Resource Strain: Not on file  Food Insecurity: Not on file  Transportation Needs: Not on file  Physical Activity: Not on file  Stress: Not on file  Social  Connections: Not on file    Review of Systems: Gen: Denies fever, chills, anorexia. Denies fatigue, weakness, weight loss.  CV: Denies chest pain, palpitations, syncope, peripheral edema, and claudication. Resp: Denies dyspnea at rest, cough, wheezing, coughing up blood, and pleurisy. GI: see HPI Derm: Denies rash, itching, dry skin Psych: Denies depression, anxiety, memory loss, confusion. No homicidal or suicidal ideation.  Heme: Denies bruising, bleeding, and enlarged lymph nodes.  Physical Exam: BP (!) 177/105   Pulse 86   Temp (!) 97.1 F (36.2 C) (Temporal)   Ht 5\' 3"  (1.6 m)   Wt 185 lb (83.9 kg) Comment: in w/c, stated by pt  BMI 32.77 kg/m  General:   Alert and oriented. No distress noted. Pleasant and cooperative.  Head:  Normocephalic and atraumatic. Eyes:  Conjuctiva clear without scleral icterus. Mouth:  mask in place Abdomen:  +BS, soft, non-tender and non-distended. No rebound or guarding. No HSM or masses noted. Msk:  wheelchair Extremities:  Without edema. Neurologic:  Alert and  oriented x4 Psych:  Alert and cooperative. Normal mood and affect.  ASSESSMENT/PLAN: LACHRISTA HESLIN is a 75 y.o. female presenting today with a history of nausea, constipation, GERD, previously established with GI in [place] but moved to this area in June 2021. Colonoscopy and EGD completed recently with internal hemorrhoids on colonoscopy and benign-appearing esophageal stenosis s/p dilation, gastritis s/p  biopsy, normal duodenum. Negative H.pylori, negative duodenal biopsy. Gallbladder remains in situ. Due to mildly dilated CBD on CT, MRI was completed which showed mild dilation of CBD measuring up to 13mm, likely age-related. Fatty liver. Anemia multifactorial in setting of chronic disease. Due to worsening IDA, she underwent capsule study with few non-bleeding erosions in small bowl, single polypoid type lesion that did not apper significant.    Constipation: although doing well on Amitzia 8 mcg po BID, Miralax, and colace, I have recommended to the facility considering trial of Amitiza 24 mcg po BID with food to hopefully decrease need for other OTC agents. Will leave to their discretion as they can monitor her closely.   GERD: doing well on Protonix daily.   As she is doing well from a GI standpoint, return in 8 months or sooner if needed.  Annitta Needs, PhD, ANP-BC [facility]

## 2021-05-03 NOTE — Patient Instructions (Signed)
We will see you in 8 months or sooner if needed!  I have written down for the [facility] to consider changing Amitiza dosage to a higher dose, that way you can hopefully come off of the stool softener and laxative.  Continue Protonix once daily!  I enjoyed seeing you again today! As you know, I value our relationship and want to provide genuine, compassionate, and quality care. I welcome your feedback. If you receive a survey regarding your visit,  I greatly appreciate you taking time to fill this out. See you next time!  Annitta Needs, PhD, ANP-BC [facility]

## 2021-05-04 ENCOUNTER — Ambulatory Visit (INDEPENDENT_AMBULATORY_CARE_PROVIDER_SITE_OTHER): Payer: Medicare Other | Admitting: Nurse Practitioner

## 2021-05-04 ENCOUNTER — Encounter: Payer: Self-pay | Admitting: Nurse Practitioner

## 2021-05-04 VITALS — BP 142/63 | HR 62 | Ht 63.0 in | Wt 174.6 lb

## 2021-05-04 DIAGNOSIS — I10 Essential (primary) hypertension: Secondary | ICD-10-CM | POA: Diagnosis not present

## 2021-05-04 DIAGNOSIS — N1832 Chronic kidney disease, stage 3b: Secondary | ICD-10-CM | POA: Diagnosis not present

## 2021-05-04 DIAGNOSIS — E782 Mixed hyperlipidemia: Secondary | ICD-10-CM

## 2021-05-04 DIAGNOSIS — E1122 Type 2 diabetes mellitus with diabetic chronic kidney disease: Secondary | ICD-10-CM

## 2021-05-04 MED ORDER — GLIPIZIDE ER 5 MG PO TB24: 5.0000 mg | ORAL_TABLET | Freq: Every day | ORAL | 3 refills | Status: DC

## 2021-05-04 MED ORDER — TRESIBA FLEXTOUCH 100 UNIT/ML ~~LOC~~ SOPN: 20.0000 [IU] | PEN_INJECTOR | Freq: Every day | SUBCUTANEOUS | 3 refills | Status: DC

## 2021-05-04 NOTE — Patient Instructions (Signed)

## 2021-05-04 NOTE — Progress Notes (Signed)
05/04/2021, 3:55 PM  Endocrinology follow-up note   Subjective:    Patient ID: Stephanie Moreno, female    DOB: 1945/08/05.  Stephanie Moreno is being seen in follow-up after she was seen in consultation for management of currently uncontrolled symptomatic diabetes requested by  Margretta Sidle, MD.   Past Medical History:  Diagnosis Date   Acid reflux disease    Anemia    Anxiety    Asthma    Breast cancer ([place])    left side   Chronic back pain    Chronic kidney disease    Diabetes mellitus without complication ([place])    Diverticulitis    Hypertension    Lumbosacral spondylosis    Per previous PCP records   Sleep apnea    Thyroid disease     Past Surgical History:  Procedure Laterality Date   ABDOMINAL HYSTERECTOMY     BACK SURGERY     BIOPSY  02/19/2020   Procedure: BIOPSY;  Surgeon: Eloise Harman, DO;  [facility];  Service: Endoscopy;;   BREAST LUMPECTOMY Left 2003   BREAST SURGERY     COLONOSCOPY WITH PROPOFOL N/A 02/19/2020   non-bleeding internal hemorrhoids, many small-mouthed diverticula in entire colon.   ESOPHAGOGASTRODUODENOSCOPY (EGD) WITH PROPOFOL N/A 02/19/2020    benign-appearing esophageal stenosis s/p dilation, gastritis s/p biopsy, normal duodenum. Negative H.pylori, negative duodenal biopsy.    GIVENS CAPSULE STUDY N/A 06/14/2020   Procedure: GIVENS CAPSULE STUDY;  Surgeon: Eloise Harman, DO;  [facility];  Service: Endoscopy;  Laterality: N/A;  7:30am   THYROIDECTOMY     tummy tuck     at time of breast surgery 2003    Social History   Socioeconomic History   Marital status: Divorced    Spouse name: Not on file   Number of children: Not on file   Years of education: Not on file   Highest education level: Not on file  Occupational History   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 [place]  Tobacco Use   Smoking status: Never   Smokeless tobacco: Never  Vaping Use    Vaping Use: Never used  Substance and Sexual Activity   Alcohol use: Not Currently   Drug use: Not Currently   Sexual activity: Not on file  Other Topics Concern   Not on file  Social History Narrative   Not on file   Social Determinants of Health   Financial Resource Strain: Not on file  Food Insecurity: Not on file  Transportation Needs: Not on file  Physical Activity: Not on file  Stress: Not on file  Social Connections: Not on file    Family History  Problem Relation Age of Onset   Diabetes Mother    Thyroid disease Mother    Colon cancer Neg Hx     Outpatient Encounter Medications as of 05/04/2021  Medication Sig   acetaminophen (TYLENOL) 325 MG tablet Take 650 mg by mouth every 4 (four) hours as needed.   amitriptyline (ELAVIL) 25 MG tablet Take 25 mg by mouth at bedtime.   calcium-vitamin D (OSCAL WITH D) 500-200 MG-UNIT TABS tablet Take 1 tablet by mouth 3 (three) times daily.   docusate sodium (COLACE) 100 MG capsule TAKE 1 CAPSULE BY MOUTH TWICE A DAY   fluticasone (FLONASE) 50 MCG/ACT nasal spray Place 2 sprays into both nostrils daily.   Fluticasone Furoate (ARNUITY ELLIPTA) 200  MCG/ACT AEPB Inhale 1 puff into the lungs daily.   furosemide (LASIX) 20 MG tablet Take 1 tablet (20 mg total) by mouth every other day. Takes 40 mg every other day. On alternate days take 80 mg   gabapentin (NEURONTIN) 300 MG capsule Take 1 capsule (300 mg total) by mouth at bedtime. (Patient taking differently: Take 300 mg by mouth 2 (two) times daily.)   glipiZIDE (GLUCOTROL XL) 5 MG 24 hr tablet Take 1 tablet (5 mg total) by mouth daily with breakfast.   insulin degludec (TRESIBA FLEXTOUCH) 100 UNIT/ML FlexTouch Pen Inject 20 Units into the skin at bedtime.   linagliptin (TRADJENTA) 5 MG TABS tablet Take 1 tablet (5 mg total) by mouth daily.   losartan (COZAAR) 25 MG tablet Take 1 tablet (25 mg total) by mouth daily.   lubiprostone (AMITIZA) 8 MCG capsule Take 8 mcg by mouth 2 (two)  times daily with a meal.   magnesium oxide (MAG-OX) 400 MG tablet Take 400 mg by mouth daily.   meclizine (ANTIVERT) 25 MG tablet Take 25 mg by mouth 3 (three) times daily as needed for dizziness.   methocarbamol (ROBAXIN) 500 MG tablet Take 1 tablet (500 mg total) by mouth every 6 (six) hours as needed for muscle spasms.   metoprolol tartrate (LOPRESSOR) 50 MG tablet Take 50 mg by mouth 2 (two) times daily.   Multiple Vitamins-Minerals (MULTIVITAMIN WITH MINERALS) tablet Take 1 tablet by mouth daily.   pantoprazole (PROTONIX) 40 MG tablet Take 1 tablet (40 mg total) by mouth daily. 30 minutes before breakfast   polyethylene glycol (MIRALAX / GLYCOLAX) 17 g packet Take 17 g by mouth daily as needed for mild constipation. (Patient taking differently: Take 17 g by mouth daily.)   rosuvastatin (CRESTOR) 10 MG tablet Take 10 mg by mouth at bedtime.    sucralfate (CARAFATE) 1 g tablet Take 1 g by mouth 3 (three) times daily.   verapamil (CALAN-SR) 180 MG CR tablet Take 1 tablet (180 mg total) by mouth daily.   [DISCONTINUED] Alogliptin Benzoate 25 MG TABS Take 25 mg by mouth daily.   No facility-administered encounter medications on file as of 05/04/2021.    ALLERGIES: Allergies  Allergen Reactions   Codeine Nausea And Vomiting   Hydrochlorothiazide Other (See Comments)   Lisinopril Nausea And Vomiting   Oxybutynin Other (See Comments)   Penicillins Hives    VACCINATION STATUS: Immunization History  Administered Date(s) Administered   Fluad Quad(high Dose 65+) 03/17/2021   Pneumococcal Conjugate-13 09/02/2014   Pneumococcal Polysaccharide-23 07/03/2010   Tdap 07/03/2000    Diabetes She presents for her follow-up diabetic visit. She has type 2 diabetes mellitus. Onset time: Diagnosed at approx age of 5. Her disease course has been fluctuating. There are no hypoglycemic associated symptoms. Associated symptoms include fatigue, foot paresthesias and weight loss. Pertinent negatives for  diabetes include no polydipsia and no polyuria. There are no hypoglycemic complications. Symptoms are stable. Diabetic complications include a CVA, nephropathy and peripheral neuropathy. Risk factors for coronary artery disease include diabetes mellitus, dyslipidemia, hypertension, post-menopausal and sedentary lifestyle. Current diabetic treatment includes oral agent (triple therapy) (recently had meds changed around after admission to rehab facility after back surgery). She is compliant with treatment most of the time. Her weight is fluctuating dramatically. She is following a generally unhealthy diet. When asked about meal planning, she reported none. She has not had a previous visit with a dietitian. She rarely participates in exercise. Her home blood glucose trend is  fluctuating dramatically. (She presents today, with her logs from the rehab facility, showing widely fluctuating glycemic profile.  She was not due for another A1c today, her most recent A1c being 7.5% on 9/13.  She did have back surgery since last visit and is now in a rehab facility.  As a result, her medications have been changed significantly.  She is no longer on insulin, is on 2 different DPP4i.  She denies any significant hypoglycemia.  No one from the rehab facility came with her to her appointment today.) An ACE inhibitor/angiotensin II receptor blocker is being taken. She does not see a podiatrist.Eye exam is current.  Hypertension This is a chronic problem. The current episode started more than 1 year ago. The problem is unchanged. The problem is controlled. Associated symptoms include peripheral edema. Agents associated with hypertension include thyroid hormones. Risk factors for coronary artery disease include diabetes mellitus, dyslipidemia, post-menopausal state and sedentary lifestyle. Past treatments include diuretics, beta blockers, ACE inhibitors and angiotensin blockers. There are no compliance problems.  Hypertensive  end-organ damage includes kidney disease and CVA. Identifiable causes of hypertension include chronic renal disease, sleep apnea and a thyroid problem.  Hyperlipidemia This is a chronic problem. The current episode started more than 1 year ago. The problem is controlled. Recent lipid tests were reviewed and are normal. Exacerbating diseases include chronic renal disease and hypothyroidism. Factors aggravating her hyperlipidemia include beta blockers. Current antihyperlipidemic treatment includes statins. The current treatment provides moderate improvement of lipids. There are no compliance problems.  Risk factors for coronary artery disease include diabetes mellitus, dyslipidemia, hypertension, obesity, post-menopausal and a sedentary lifestyle.    Review of systems  Constitutional: + minimally fluctuating body weight,  current Body mass index is 30.93 kg/m. , + fatigue, no subjective hyperthermia, no subjective hypothermia Eyes: no blurry vision, no xerophthalmia ENT: no sore throat, no nodules palpated in throat, no dysphagia/odynophagia, no hoarseness Cardiovascular: no chest pain, no shortness of breath, no palpitations, + leg swelling Respiratory: no cough, no shortness of breath Gastrointestinal: no nausea/vomiting/diarrhea Genitourinary: + urinary incontinence and retention, burning with urination Musculoskeletal: no muscle/joint aches, walks with walker due to previous CVA and disequilibrium- currently using WC due to recent back surgery- in corset brace Skin: no rashes, no hyperemia Neurological: no tremors, no dizziness, + numbness/tingling to BLE (on gabapentin) Psychiatric: no depression, no anxiety  Objective:     BP (!) 142/63   Pulse 62   Ht 5\' 3"  (1.6 m)   Wt 174 lb 9.6 oz (79.2 kg)   BMI 30.93 kg/m   Wt Readings from Last 3 Encounters:  05/04/21 174 lb 9.6 oz (79.2 kg)  05/03/21 185 lb (83.9 kg)  04/27/21 188 lb (85.3 kg)    BP Readings from Last 3 Encounters:   05/04/21 (!) 142/63  05/03/21 (!) 177/105  04/27/21 130/82     Physical Exam- Limited  Constitutional:  Body mass index is 30.93 kg/m. , not in acute distress, normal state of mind Eyes:  EOMI, no exophthalmos Neck: Supple Cardiovascular: RRR, no murmurs, rubs, or gallops, + pitting edema to BLE Respiratory: Adequate breathing efforts, no crackles, rales, rhonchi, or wheezing Musculoskeletal: no gross deformities, strength intact in all four extremities, no gross restriction of joint movements, currently in [facility] after recent back surgery Skin:  no rashes, no hyperemia Neurological: no tremor with outstretched hands     CMP ( most recent) CMP     Component Value Date/Time   NA 138 04/19/2021 0000  K 4.1 04/19/2021 0000   CL 110 (A) 04/19/2021 0000   CO2 21 04/19/2021 0000   GLUCOSE 135 (H) 04/15/2021 0648   BUN 17 04/19/2021 0000   CREATININE 1.1 04/19/2021 0000   CREATININE 1.14 (H) 04/15/2021 0648   CREATININE 1.42 (H) 10/12/2020 1009   CALCIUM 9.4 04/19/2021 0000   PROT 9.2 (H) 04/11/2021 2248   ALBUMIN 3.0 (L) 04/15/2021 0648   AST 27 04/11/2021 2248   ALT 20 04/11/2021 2248   ALKPHOS 83 04/11/2021 2248   BILITOT 0.5 04/11/2021 2248   GFRNONAA 50 (L) 04/15/2021 0648   GFRNONAA 36 (L) 10/12/2020 1009   GFRAA 42 (L) 10/12/2020 1009     Diabetic Labs (most recent): Lab Results  Component Value Date   HGBA1C 7.2 04/19/2021   HGBA1C 7.5 (H) 03/15/2021   HGBA1C 8.2 (H) 01/10/2021     Lipid Panel ( most recent) Lipid Panel     Component Value Date/Time   CHOL 91 04/19/2021 0000   TRIG 78 04/19/2021 0000   HDL 42 04/19/2021 0000   CHOLHDL 2.0 10/12/2020 1009   LDLCALC 34 04/19/2021 0000   LDLCALC 46 10/12/2020 1009      Lab Results  Component Value Date   TSH 3.94 10/12/2020   TSH 1.18 03/09/2020      Assessment & Plan:   1) Type 2 diabetes mellitus with stage 3b chronic kidney disease, without long-term current use of insulin ([place])  - Stephanie Moreno has currently uncontrolled symptomatic type 2 DM since  75 years of age.  She presents today, with her logs from the rehab facility, showing widely fluctuating glycemic profile.  She was not due for another A1c today, her most recent A1c being 7.5% on 9/13.  She did have back surgery since last visit and is now in a rehab facility.  As a result, her medications have been changed significantly.  She is no longer on insulin, is on 2 different DPP4i.  She denies any significant hypoglycemia.  No one from the rehab facility came with her to her appointment today.   Recent labs reviewed.    - I had a long discussion with her about the progressive nature of diabetes and the pathology behind its complications. -her diabetes is complicated by CKD, CVA and she remains at a high risk for more acute and chronic complications which include CAD, CVA, CKD, retinopathy, and neuropathy. These are all discussed in detail with her.  - Nutritional counseling repeated at each appointment due to patients tendency to fall back in to old habits.  - The patient admits there is a room for improvement in their diet and drink choices. -  Suggestion is made for the patient to avoid simple carbohydrates from their diet including Cakes, Sweet Desserts / Pastries, Ice Cream, Soda (diet and regular), Sweet Tea, Candies, Chips, Cookies, Sweet Pastries, Store Bought Juices, Alcohol in Excess of 1-2 drinks a day, Artificial Sweeteners, Coffee Creamer, and "Sugar-free" Products. This will help patient to have stable blood glucose profile and potentially avoid unintended weight gain.   - I encouraged the patient to switch to unprocessed or minimally processed complex starch and increased protein intake (animal or plant source), fruits, and vegetables.   - Patient is advised to stick to a routine mealtimes to eat 3 meals a day and avoid unnecessary snacks (to snack only to correct hypoglycemia).  - she will be scheduled with  Jearld Fenton, RDN, CDE for diabetes education.  - I  have approached her with the following individualized plan to manage  her diabetes and patient agrees:   - she will need at least basal insulin in order for her to achieve control of diabetes to target, and she accepts this treatment option at this time.    -I restarted her Tyler Aas at lower dose of 20 units SQ nightly, restarted her Glipizide 5 mg XL daily with breakfast, and continued her Tradjenta 5 mg po daily.    I discontinued the Alogliptin (duplicate TKW4O with Tradjenta) and advised her to stay off the Trulicity for now.    -She is encouraged to continue monitoring blood glucose twice daily, before breakfast and before bed, and to call the clinic if she has readings less than 70 or greater than 300 for 3 tests in a row,  - Specific targets for  A1c;  LDL, HDL,  and Triglycerides were discussed with the patient.  2) Blood Pressure /Hypertension:   Her blood pressure is controlled to target for her age.   she is advised to continue her current medications including Losartan 25 mg p.o. daily with breakfast,  Lasix 40 mg and 80 mg on alternating days, Verapamil 180 mg po daily, and Metoprolol 50 mg po twice daily .  3) Lipids/Hyperlipidemia:    Review of her recent lipid panel from 10/12/20 showed controlled LDL at 46.  she  is advised to continue Crestor 10 mg p.o. daily at bedtime.   Side effects and precautions discussed with her.  4)  Weight/Diet:  Her Body mass index is 30.93 kg/m.  -   clearly complicating her diabetes care.   she is  a candidate for weight loss. I discussed with her the fact that loss of 5 - 10% of her  current body weight will have the most impact on her diabetes management.  Exercise, and detailed carbohydrates information provided  -  detailed on discharge instructions.  5) Chronic Care/Health Maintenance: -she  is on ACEI/ARB and Statin medications and  is encouraged to initiate and continue to follow up  with Ophthalmology, Dentist,  Podiatrist at least yearly or according to recommendations, and advised to  stay away from smoking. I have recommended yearly flu vaccine and pneumonia vaccine at least every 5 years; she cannot exercise optimally due to her disequilibrium.  She is advised to sleep for at least 7 hours a day.  - she is  advised to maintain close follow up with Margretta Sidle, MD for primary care needs, as well as her other providers for optimal and coordinated care.     I spent 40 minutes in the care of the patient today including review of labs from [facility], Lipids, Thyroid Function, Hematology (current and previous including abstractions from other facilities); face-to-face time discussing  her blood glucose readings/logs, discussing hypoglycemia and hyperglycemia episodes and symptoms, medications doses, her options of short and long term treatment based on the latest standards of care / guidelines;  discussion about incorporating lifestyle medicine;  and documenting the encounter.    Please refer to Patient Instructions for Blood Glucose Monitoring and Insulin/Medications Dosing Guide"  in media tab for additional information. Please  also refer to " Patient Self Inventory" in the Media  tab for reviewed elements of pertinent patient history.  Stephanie Moreno participated in the discussions, expressed understanding, and voiced agreement with the above plans.  All questions were answered to her satisfaction. she is encouraged to contact clinic should she have any questions or concerns prior  to her return visit.   Follow up plan: - Return in about 1 month (around 06/03/2021) for Diabetes F/U, Bring meter and logs.  Rayetta Pigg, [facility] [address] [place], [place] [address] Phone: [phone] Fax: [phone]  05/04/2021, 3:55 PM

## 2021-05-05 ENCOUNTER — Encounter: Payer: Self-pay | Admitting: Podiatry

## 2021-05-05 ENCOUNTER — Other Ambulatory Visit: Payer: Self-pay

## 2021-05-05 ENCOUNTER — Ambulatory Visit (INDEPENDENT_AMBULATORY_CARE_PROVIDER_SITE_OTHER): Payer: Medicare Other | Admitting: Surgery

## 2021-05-05 ENCOUNTER — Encounter: Payer: Self-pay | Admitting: Surgery

## 2021-05-05 ENCOUNTER — Ambulatory Visit (INDEPENDENT_AMBULATORY_CARE_PROVIDER_SITE_OTHER): Payer: Medicare Other | Admitting: Podiatry

## 2021-05-05 VITALS — BP 133/75 | HR 68

## 2021-05-05 DIAGNOSIS — Z981 Arthrodesis status: Secondary | ICD-10-CM

## 2021-05-05 DIAGNOSIS — E1142 Type 2 diabetes mellitus with diabetic polyneuropathy: Secondary | ICD-10-CM

## 2021-05-05 DIAGNOSIS — B351 Tinea unguium: Secondary | ICD-10-CM | POA: Diagnosis not present

## 2021-05-05 DIAGNOSIS — M79676 Pain in unspecified toe(s): Secondary | ICD-10-CM

## 2021-05-05 NOTE — Progress Notes (Signed)
75 year old black female was about 7-week status post L4-5 central laminectomy and L5-S1 TLIF returns.  States that she is doing well.  She is rehabbing at a skilled facility and making good progress.  She is wearing her brace.  No longer has back pain.  Exam Pleasant female alert and oriented in no acute distress.  Surgical incision is well-healed.  She is neurologically intact.  Plan Patient continue PT protocol.  Must also continue wearing her brace until 12 weeks postop.  Follow-up with Dr. Louanne Skye in 5 weeks for recheck and he will make decision as to whether or not she can come out of her brace.  He will likely get repeat x-rays at that time.  All questions answered.

## 2021-05-05 NOTE — Progress Notes (Signed)
She presents today after having her back fused several months ago now stating that she is still having some pain in her legs and feet.  States that the doctor says most likely it is due from her surgery.  Objective: Vital signs are stable alert oriented x3.  Pulses are palpable.  Toenails are long thick yellow dystrophic onychomycotic painful palpation.  Assessment: Pain limb secondary to onychomycosis.  Plan: Debridement of toenails 1 through 5 bilateral.

## 2021-05-12 ENCOUNTER — Ambulatory Visit (INDEPENDENT_AMBULATORY_CARE_PROVIDER_SITE_OTHER): Payer: Medicare Other | Admitting: Urology

## 2021-05-12 ENCOUNTER — Encounter: Payer: Self-pay | Admitting: Urology

## 2021-05-12 ENCOUNTER — Other Ambulatory Visit: Payer: Self-pay

## 2021-05-12 VITALS — BP 114/66 | HR 59 | Temp 98.9°F

## 2021-05-12 DIAGNOSIS — N390 Urinary tract infection, site not specified: Secondary | ICD-10-CM | POA: Diagnosis not present

## 2021-05-12 DIAGNOSIS — Z87898 Personal history of other specified conditions: Secondary | ICD-10-CM | POA: Diagnosis not present

## 2021-05-12 NOTE — Progress Notes (Signed)
Assessment: 1. History of urinary retention   2. Urinary tract infection without hematuria, site unspecified     Plan: She is voiding spontaneously following catheter removal. Request recent urine culture results for review.  Please fax to [phone]. Complete course of Cipro. Pyridium as needed. Return to office in 4-6 weeks.  Chief Complaint:  Chief Complaint  Patient presents with   Urinary Retention     History of Present Illness:  Stephanie Moreno is a 75 y.o. year old female who is seen for further evaluation of urinary retention.  She was admitted to the hospital on 04/11/2021 for generalized weakness.  She underwent a lumbar laminectomy in September 2022.  After returning home she developed significant weakness.  She was admitted with severe dehydration and acute on chronic renal insufficiency.  Urinalysis showed positive nitrite, >50 RBCs, >50 WBCs, and many bacteria.  Urine culture grew multiple species.  She did have problems with urinary retention while in the hospital.  She required in and out catheterization and eventually Foley catheter was placed prior to discharge. Her Foley has been draining well.  No gross hematuria. No prior history of urinary retention.  She denies any problems voiding prior to her hospitalization. She does report some problems with constipation.  Her Foley was removed on 04/28/2021.   She has been voiding spontaneously following catheter removal.  She has had some recent dysuria.  A urine culture was obtained on 05/05/2021.  She was started on Cipro on 05/09/2021.  She is also taking Pyridium as needed.  She is having some episodes of incontinence.  No fevers or chills.  No gross hematuria.  She is currently at [facility] in [place].  Portions of the above documentation were copied from a prior visit for review purposes only.   Past Medical History:  Past Medical History:  Diagnosis Date   Acid reflux disease    Anemia     Anxiety    Asthma    Breast cancer ([place])    left side   Chronic back pain    Chronic kidney disease    Diabetes mellitus without complication ([place])    Diverticulitis    Hypertension    Lumbosacral spondylosis    Per previous PCP records   Sleep apnea    Thyroid disease     Past Surgical History:  Past Surgical History:  Procedure Laterality Date   ABDOMINAL HYSTERECTOMY     BACK SURGERY     BIOPSY  02/19/2020   Procedure: BIOPSY;  Surgeon: Eloise Harman, DO;  [facility];  Service: Endoscopy;;   BREAST LUMPECTOMY Left 2003   BREAST SURGERY     COLONOSCOPY WITH PROPOFOL N/A 02/19/2020   non-bleeding internal hemorrhoids, many small-mouthed diverticula in entire colon.   ESOPHAGOGASTRODUODENOSCOPY (EGD) WITH PROPOFOL N/A 02/19/2020    benign-appearing esophageal stenosis s/p dilation, gastritis s/p biopsy, normal duodenum. Negative H.pylori, negative duodenal biopsy.    GIVENS CAPSULE STUDY N/A 06/14/2020   Procedure: GIVENS CAPSULE STUDY;  Surgeon: Eloise Harman, DO;  [facility];  Service: Endoscopy;  Laterality: N/A;  7:30am   THYROIDECTOMY     tummy tuck     at time of breast surgery 2003    Allergies:  Allergies  Allergen Reactions   Codeine Nausea And Vomiting   Hydrochlorothiazide Other (See Comments)   Lisinopril Nausea And Vomiting   Oxybutynin Other (See Comments)   Penicillins Hives    Family History:  Family History  Problem Relation Age of Onset   Diabetes Mother    Thyroid disease Mother    Colon cancer Neg Hx     Social History:  Social History   Tobacco Use   Smoking status: Never   Smokeless tobacco: Never  Vaping Use   Vaping Use: Never used  Substance Use Topics   Alcohol use: Not Currently   Drug use: Not Currently    ROS: Constitutional:  Negative for fever, chills, weight loss CV: Negative for chest pain, previous MI, hypertension Respiratory:  Negative for shortness of breath, wheezing, sleep  apnea, frequent cough GI:  Negative for nausea, vomiting, bloody stool, GERD  Physical exam: BP 114/66   Pulse (!) 59   Temp 98.9 F (37.2 C)  GENERAL APPEARANCE:  Well appearing, well developed, well nourished, NAD HEENT:  Atraumatic, normocephalic, oropharynx clear NECK:  Supple without lymphadenopathy or thyromegaly ABDOMEN:  Soft, non-tender, no masses EXTREMITIES:  Without clubbing, cyanosis, or edema NEUROLOGIC:  Alert and oriented x 3, in wheelchair, CN II-XII grossly intact MENTAL STATUS:  appropriate BACK:  Non-tender to palpation, No CVAT SKIN:  Warm, dry, and intact  Results: No specimen provided

## 2021-05-12 NOTE — Progress Notes (Signed)
Urological Symptom Review  Patient is experiencing the following symptoms: Burning/pain with urination   Review of Systems  Gastrointestinal (upper)  : Negative for upper GI symptoms  Gastrointestinal (lower) : Negative for lower GI symptoms  Constitutional : Negative for symptoms  Skin: Negative for skin symptoms  Eyes: Negative for eye symptoms  Ear/Nose/Throat : Negative for Ear/Nose/Throat symptoms  Hematologic/Lymphatic: Negative for Hematologic/Lymphatic symptoms  Cardiovascular : Negative for cardiovascular symptoms  Respiratory : Negative for respiratory symptoms  Endocrine: Negative for endocrine symptoms  Musculoskeletal: Negative for musculoskeletal symptoms  Neurological: Negative for neurological symptoms  Psychologic: Negative for psychiatric symptoms 

## 2021-05-13 ENCOUNTER — Other Ambulatory Visit: Payer: Self-pay

## 2021-06-06 ENCOUNTER — Ambulatory Visit: Payer: Medicare Other | Admitting: Nurse Practitioner

## 2021-06-06 NOTE — Patient Instructions (Incomplete)

## 2021-06-10 NOTE — Progress Notes (Signed)
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  States that symptoms unchanged from previous visit.  She is wanting to proceed with CENTRAL LAMINECTOMY LUMBAR FOUR-FIVE  AND LUMBAR FIVE-SACRAL ONE,TRANSFORAMINAL LUMBAR INTERBODY FUSION LEFT LUMBAR FIVE-SACRAL ONE WITH GLOBUS PEDICLE SCREWS, RODS AND SABLE CAGE, LOCAL BONE GRAFT, ALLOGRAFT BONE GRAFT.  Today history and physical performed.  Review of systems negative.  Surgical procedure discussed along with potential rehab/recovery time.  All questions answered and she wishes to proceed.

## 2021-06-13 ENCOUNTER — Ambulatory Visit: Payer: Medicare Other | Admitting: Urology

## 2021-06-13 NOTE — Progress Notes (Deleted)
Assessment: 1. History of urinary retention   2. Urinary tract infection without hematuria, site unspecified     Plan: She is voiding spontaneously following catheter removal. Request recent urine culture results for review.  Please fax to [phone]. Complete course of Cipro. Pyridium as needed. Return to office in 4-6 weeks.  Chief Complaint:  No chief complaint on file.    History of Present Illness:  Stephanie Moreno is a 75 y.o. year old female who is seen for further evaluation of urinary retention.  She was admitted to the hospital on 04/11/2021 for generalized weakness.  She underwent a lumbar laminectomy in September 2022.  After returning home she developed significant weakness.  She was admitted with severe dehydration and acute on chronic renal insufficiency.  Urinalysis showed positive nitrite, >50 RBCs, >50 WBCs, and many bacteria.  Urine culture grew multiple species.  She did have problems with urinary retention while in the hospital.  She required in and out catheterization and eventually Foley catheter was placed prior to discharge. Her Foley has been draining well.  No gross hematuria. No prior history of urinary retention.  She denies any problems voiding prior to her hospitalization. She does report some problems with constipation.  Her Foley was removed on 04/28/2021.   She has been voiding spontaneously following catheter removal.  She has had some recent dysuria.  A urine culture was obtained on 05/05/2021.  She was started on Cipro on 05/09/2021.  She is also taking Pyridium as needed.  She is having some episodes of incontinence.  No fevers or chills.  No gross hematuria.  She is currently at [facility] in [place].  Portions of the above documentation were copied from a prior visit for review purposes only.   Past Medical History:  Past Medical History:  Diagnosis Date   Acid reflux disease    Anemia    Anxiety    Asthma    Breast  cancer ([place])    left side   Chronic back pain    Chronic kidney disease    Diabetes mellitus without complication ([place])    Diverticulitis    Hypertension    Lumbosacral spondylosis    Per previous PCP records   Sleep apnea    Thyroid disease     Past Surgical History:  Past Surgical History:  Procedure Laterality Date   ABDOMINAL HYSTERECTOMY     BACK SURGERY     BIOPSY  02/19/2020   Procedure: BIOPSY;  Surgeon: Eloise Harman, DO;  [facility];  Service: Endoscopy;;   BREAST LUMPECTOMY Left 2003   BREAST SURGERY     COLONOSCOPY WITH PROPOFOL N/A 02/19/2020   non-bleeding internal hemorrhoids, many small-mouthed diverticula in entire colon.   ESOPHAGOGASTRODUODENOSCOPY (EGD) WITH PROPOFOL N/A 02/19/2020    benign-appearing esophageal stenosis s/p dilation, gastritis s/p biopsy, normal duodenum. Negative H.pylori, negative duodenal biopsy.    GIVENS CAPSULE STUDY N/A 06/14/2020   Procedure: GIVENS CAPSULE STUDY;  Surgeon: Eloise Harman, DO;  [facility];  Service: Endoscopy;  Laterality: N/A;  7:30am   THYROIDECTOMY     tummy tuck     at time of breast surgery 2003    Allergies:  Allergies  Allergen Reactions   Codeine Nausea And Vomiting   Hydrochlorothiazide Other (See Comments)   Lisinopril Nausea And Vomiting   Oxybutynin Other (See Comments)   Penicillins Hives    Family History:  Family History  Problem Relation Age  of Onset   Diabetes Mother    Thyroid disease Mother    Colon cancer Neg Hx     Social History:  Social History   Tobacco Use   Smoking status: Never   Smokeless tobacco: Never  Vaping Use   Vaping Use: Never used  Substance Use Topics   Alcohol use: Not Currently   Drug use: Not Currently    ROS: Constitutional:  Negative for fever, chills, weight loss CV: Negative for chest pain, previous MI, hypertension Respiratory:  Negative for shortness of breath, wheezing, sleep apnea, frequent cough GI:   Negative for nausea, vomiting, bloody stool, GERD  Physical exam: There were no vitals taken for this visit. ***  Results: ***

## 2021-06-20 ENCOUNTER — Other Ambulatory Visit: Payer: Self-pay | Admitting: Nurse Practitioner

## 2021-06-20 DIAGNOSIS — N1832 Chronic kidney disease, stage 3b: Secondary | ICD-10-CM

## 2021-06-23 ENCOUNTER — Other Ambulatory Visit: Payer: Self-pay

## 2021-06-23 ENCOUNTER — Other Ambulatory Visit: Payer: Medicare Other

## 2021-06-23 DIAGNOSIS — N390 Urinary tract infection, site not specified: Secondary | ICD-10-CM

## 2021-06-23 LAB — URINALYSIS, ROUTINE W REFLEX MICROSCOPIC
Bilirubin, UA: NEGATIVE
Glucose, UA: NEGATIVE
Ketones, UA: NEGATIVE
Nitrite, UA: NEGATIVE
RBC, UA: NEGATIVE
Specific Gravity, UA: 1.015 (ref 1.005–1.030)
Urobilinogen, Ur: 0.2 mg/dL (ref 0.2–1.0)
pH, UA: 5.5 (ref 5.0–7.5)

## 2021-06-23 LAB — MICROSCOPIC EXAMINATION
Epithelial Cells (non renal): >10 /hpf — AB (ref 0–10)
RBC, Urine: NONE SEEN /hpf (ref 0–2)
Renal Epithel, UA: NONE SEEN /hpf

## 2021-06-25 LAB — URINE CULTURE

## 2021-06-28 ENCOUNTER — Telehealth: Payer: Self-pay

## 2021-06-28 ENCOUNTER — Ambulatory Visit: Payer: Medicare Other | Admitting: Urology

## 2021-06-28 NOTE — Telephone Encounter (Signed)
-----   Message from [place], [place] sent at 06/28/2021  8:22 AM EST ----- Pt urine cx negative-indicates no need for additional or new antibiotics. Please check with pt to ensure sxs have resolved from the 22nd. Thanks! ----- Message ----- From: Interface, [facility] Lab Results In Sent: 06/23/2021   3:51 PM EST To: Reynaldo Minium, PA-C

## 2021-06-28 NOTE — Telephone Encounter (Signed)
Patient called and notified. Denies any UTI symptoms.

## 2021-07-06 ENCOUNTER — Telehealth: Payer: Self-pay | Admitting: Nurse Practitioner

## 2021-07-06 ENCOUNTER — Encounter: Payer: Self-pay | Admitting: Specialist

## 2021-07-06 ENCOUNTER — Ambulatory Visit (INDEPENDENT_AMBULATORY_CARE_PROVIDER_SITE_OTHER): Payer: Commercial Managed Care - HMO

## 2021-07-06 ENCOUNTER — Ambulatory Visit (INDEPENDENT_AMBULATORY_CARE_PROVIDER_SITE_OTHER): Payer: Medicare Other | Admitting: Specialist

## 2021-07-06 ENCOUNTER — Other Ambulatory Visit: Payer: Self-pay

## 2021-07-06 VITALS — BP 168/93 | HR 74 | Temp 97.6°F | Ht 63.0 in | Wt 174.0 lb

## 2021-07-06 DIAGNOSIS — Z981 Arthrodesis status: Secondary | ICD-10-CM

## 2021-07-06 NOTE — Patient Instructions (Addendum)
Avoid frequent bending and stooping  No lifting greater than 10 lbs. May use ice or moist heat for pain. Weight loss is of benefit. Best medication for lumbar disc disease is arthritis medications like tylenol and ultram. Exercise is important to improve your indurance and does allow people to function better inspite of back pain.

## 2021-07-06 NOTE — Telephone Encounter (Signed)
I have submitted this request to [facility].

## 2021-07-06 NOTE — Telephone Encounter (Signed)
Did you discuss sending a CGM for this patient? I do not see mentioned in your note.

## 2021-07-06 NOTE — Progress Notes (Signed)
Post-Op Visit Note   Patient: Stephanie Moreno           Date of Birth: 1946-02-13           MRN: [id number] Visit Date: 07/06/2021 PCP: Margretta Sidle, MD   Assessment & Plan:3.5 months post op L5-S1 TLIF with laminectomy L4-5 3 months post op TLIF L5-S1 and L4-5 laminectomy for stenosis with spondylolisthesis L5-S1. Motor is normal Sensation is normal  Taking tramadol BID and tylenol 650 mg BID  Chief Complaint:  Chief Complaint  Patient presents with   Lower Back - Follow-up   Visit Diagnoses:  1. S/P lumbar fusion     Plan: Avoid frequent bending and stooping  No lifting greater than 10 lbs. May use ice or moist heat for pain. Weight loss is of benefit. Best medication for lumbar disc disease is arthritis medications like tylenol and ultram. Exercise is important to improve your indurance and does allow people to function better insp  Follow-Up Instructions: Return in about 3 months (around 10/04/2021).   Orders:  Orders Placed This Encounter  Procedures   XR Lumbar Spine 2-3 Views   No orders of the defined types were placed in this encounter.   Imaging: No results found.  PMFS History: Patient Active Problem List   Diagnosis Date Noted   Urge incontinence 07/26/2021   Urinary retention 04/15/2021   Acute kidney injury ([place]) 04/12/2021   Acute renal failure superimposed on chronic kidney disease, unspecified CKD stage, unspecified acute renal failure type ([place]) 04/12/2021   Acute renal failure superimposed on stage 3b chronic kidney disease ([place]) 04/12/2021   Dehydration    Spondylolisthesis, lumbar region    Spinal stenosis, lumbar region, with neurogenic claudication    Dysphagia 03/18/2021   Speech disturbance 03/18/2021   Wheezing 03/18/2021   Ileus ([place]) 03/18/2021   Fusion of spine of lumbar region 03/15/2021   Cerebrovascular disease 12/20/2020   GERD (gastroesophageal reflux disease) 11/18/2020   Type 2 diabetes mellitus with stage 3b chronic  kidney disease, without long-term current use of insulin ([place]) 07/12/2020   Mixed hyperlipidemia 07/12/2020   Essential hypertension, benign 07/12/2020   Iron deficiency anemia    Loss of weight 04/09/2020   Dyspepsia 01/28/2020   Constipation 01/28/2020   Lower abdominal pain 01/28/2020   Normocytic anemia 01/28/2020   Past Medical History:  Diagnosis Date   Acid reflux disease    Anemia    Anxiety    Asthma    Breast cancer ([place])    left side   Chronic back pain    Chronic kidney disease    Depression    Diabetes mellitus without complication ([place])    Diverticulitis    Hypertension    Lumbosacral spondylosis    Per previous PCP records   Sleep apnea    Thyroid disease     Family History  Problem Relation Age of Onset   Diabetes Mother    Thyroid disease Mother    Colon cancer Neg Hx     Past Surgical History:  Procedure Laterality Date   ABDOMINAL HYSTERECTOMY     BACK SURGERY     BIOPSY  02/19/2020   Procedure: BIOPSY;  Surgeon: Eloise Harman, DO;  [facility];  Service: Endoscopy;;   BREAST LUMPECTOMY Left 2003   BREAST SURGERY     COLONOSCOPY WITH PROPOFOL N/A 02/19/2020   non-bleeding internal hemorrhoids, many small-mouthed diverticula in entire colon.   ESOPHAGOGASTRODUODENOSCOPY (EGD) WITH PROPOFOL N/A 02/19/2020  benign-appearing esophageal stenosis s/p dilation, gastritis s/p biopsy, normal duodenum. Negative H.pylori, negative duodenal biopsy.    GIVENS CAPSULE STUDY N/A 06/14/2020   Procedure: GIVENS CAPSULE STUDY;  Surgeon: Eloise Harman, DO;  [facility];  Service: Endoscopy;  Laterality: N/A;  7:30am   THYROIDECTOMY     tummy tuck     at time of breast surgery 2003   Social History   Occupational History   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 [place]  Tobacco Use   Smoking status: Never   Smokeless tobacco: Never  Vaping Use   Vaping Use: Never used  Substance and Sexual Activity    Alcohol use: Not Currently   Drug use: Not Currently   Sexual activity: Not on file

## 2021-07-06 NOTE — Telephone Encounter (Signed)
I dont remember discussing it but I possibly could have.  She was in a rehab facility last time I saw her... has she been released?  If so, we can order her the Elenor Legato but it would need to come from [facility] given her [employer].

## 2021-07-06 NOTE — Telephone Encounter (Signed)
Patients daughter called and states the patient reached out to her asking about her Elenor Legato and that [place] discussed with her she wanted her on this but has not heard from the pharmacy to pick up. She is requesting the supplies be sent to [facility].   Pt missed her appointment in December, I tried reaching out to patient to set up a follow up appt and she did not answer and I did not have an option to leave voicemail.

## 2021-07-07 ENCOUNTER — Telehealth: Payer: Self-pay

## 2021-07-07 NOTE — Telephone Encounter (Signed)
[name] from [facility] called office stating patient was complaining of dysuria and requested an order for ua/culture.  Reviewed with Dr. Felipa Eth and order ok to give.  [name] will send to [facility] and fax results to office.

## 2021-07-11 ENCOUNTER — Other Ambulatory Visit (HOSPITAL_COMMUNITY): Payer: Self-pay | Admitting: Psychiatry

## 2021-07-12 ENCOUNTER — Other Ambulatory Visit: Payer: Self-pay | Admitting: Urology

## 2021-07-14 ENCOUNTER — Ambulatory Visit (INDEPENDENT_AMBULATORY_CARE_PROVIDER_SITE_OTHER): Payer: Commercial Managed Care - HMO | Admitting: Podiatry

## 2021-07-14 ENCOUNTER — Other Ambulatory Visit: Payer: Self-pay

## 2021-07-14 DIAGNOSIS — M79676 Pain in unspecified toe(s): Secondary | ICD-10-CM

## 2021-07-14 DIAGNOSIS — B351 Tinea unguium: Secondary | ICD-10-CM | POA: Diagnosis not present

## 2021-07-14 DIAGNOSIS — E1142 Type 2 diabetes mellitus with diabetic polyneuropathy: Secondary | ICD-10-CM | POA: Diagnosis not present

## 2021-07-14 DIAGNOSIS — M7751 Other enthesopathy of right foot: Secondary | ICD-10-CM

## 2021-07-14 MED ORDER — TRIAMCINOLONE ACETONIDE 40 MG/ML IJ SUSP
20.0000 mg | Freq: Once | INTRAMUSCULAR | Status: AC
  Administered 2021-07-14: 20 mg

## 2021-07-14 MED ORDER — GABAPENTIN 300 MG PO CAPS: 600.0000 mg | ORAL_CAPSULE | Freq: Every day | ORAL | 3 refills | Status: DC

## 2021-07-14 NOTE — Progress Notes (Signed)
[place] presents today for her diabetic peripheral neuropathy painfully elongated toenails and is also complaining of a painful area to the medial aspect of the hallux interphalangeal joint right foot.  She states that that bump is really sore particularly when shoes.  Objective: Vital signs are stable alert oriented x3 there is no erythema edema cellulitis drainage or odor toenails are long thick yellow dystrophic clinical mycotic and painful palpation as well as debridement.  She also has a osteoarthritic spur with overlying bursitis capsulitis of the hallux interphalangeal joint dorsal medial aspect of the right foot.  Assessment: Pain in limb secondary to capsulitis bursitis hallux interphalangeal joint right foot.  Pain in limb secondary to onychomycosis.  Plan: Debridement of toenails 1 through 5 bilaterally.  Also injected the bursitis today with 2 mg of dexamethasone and local anesthetic she tolerated the procedure well.  Like to follow-up with her in a few months for routine debridement.

## 2021-07-19 ENCOUNTER — Other Ambulatory Visit: Payer: Self-pay | Admitting: Nurse Practitioner

## 2021-07-19 ENCOUNTER — Other Ambulatory Visit (HOSPITAL_COMMUNITY): Payer: Self-pay | Admitting: Family Medicine

## 2021-07-19 DIAGNOSIS — Z1231 Encounter for screening mammogram for malignant neoplasm of breast: Secondary | ICD-10-CM

## 2021-07-19 DIAGNOSIS — N1832 Chronic kidney disease, stage 3b: Secondary | ICD-10-CM

## 2021-07-26 ENCOUNTER — Other Ambulatory Visit: Payer: Self-pay

## 2021-07-26 ENCOUNTER — Ambulatory Visit (INDEPENDENT_AMBULATORY_CARE_PROVIDER_SITE_OTHER): Payer: Medicare Other | Admitting: Urology

## 2021-07-26 ENCOUNTER — Encounter: Payer: Self-pay | Admitting: Urology

## 2021-07-26 VITALS — BP 160/80 | HR 83 | Ht 63.0 in | Wt 178.0 lb

## 2021-07-26 DIAGNOSIS — N3941 Urge incontinence: Secondary | ICD-10-CM

## 2021-07-26 DIAGNOSIS — Z87440 Personal history of urinary (tract) infections: Secondary | ICD-10-CM

## 2021-07-26 DIAGNOSIS — R339 Retention of urine, unspecified: Secondary | ICD-10-CM

## 2021-07-26 DIAGNOSIS — N390 Urinary tract infection, site not specified: Secondary | ICD-10-CM

## 2021-07-26 DIAGNOSIS — Z87898 Personal history of other specified conditions: Secondary | ICD-10-CM

## 2021-07-26 LAB — MICROSCOPIC EXAMINATION
Epithelial Cells (non renal): >10 /hpf — AB (ref 0–10)
Renal Epithel, UA: NONE SEEN /hpf

## 2021-07-26 LAB — BLADDER SCAN AMB NON-IMAGING: Scan Result: 2

## 2021-07-26 LAB — URINALYSIS, ROUTINE W REFLEX MICROSCOPIC
Bilirubin, UA: NEGATIVE
Glucose, UA: NEGATIVE
Ketones, UA: NEGATIVE
Nitrite, UA: NEGATIVE
Specific Gravity, UA: 1.015 (ref 1.005–1.030)
Urobilinogen, Ur: 0.2 mg/dL (ref 0.2–1.0)
pH, UA: 5.5 (ref 5.0–7.5)

## 2021-07-26 MED ORDER — MIRABEGRON ER 50 MG PO TB24: 50.0000 mg | ORAL_TABLET | Freq: Every day | ORAL | 0 refills | Status: DC

## 2021-07-26 NOTE — Progress Notes (Signed)
Assessment: 1. Urge incontinence   2. History of UTI   3. History of urinary retention     Plan: Trial of Myrbetriq 50 mg daily in place of Gemtesa.  Samples given. Return to office in 1 month  Chief Complaint:  Chief Complaint  Patient presents with   Urinary Retention    History of Present Illness:  Stephanie Moreno is a 76 y.o. year old female who is seen for further evaluation of urinary retention.  She was admitted to the hospital on 04/11/2021 for generalized weakness.  She underwent a lumbar laminectomy in September 2022.  After returning home she developed significant weakness.  She was admitted with severe dehydration and acute on chronic renal insufficiency.  Urinalysis showed positive nitrite, >50 RBCs, >50 WBCs, and many bacteria.  Urine culture grew multiple species.  She did have problems with urinary retention while in the hospital.  She required in and out catheterization and eventually Foley catheter was placed prior to discharge. Her Foley has been draining well.  No gross hematuria. No prior history of urinary retention.  She denies any problems voiding prior to her hospitalization. She does report some problems with constipation.  Her Foley was removed on 04/28/2021.   She began voiding spontaneously following catheter removal.  A urine culture from 05/05/2021 grew >100 K ESBL.  She was started on Cipro on 05/09/2021.   She reported some episodes of incontinence.  No fevers or chills.  No gross hematuria. Urinalysis from 07/07/2021 showed no WBCs, no RBCs, no bacteria.  Urine culture grew <10K mixed flora.  She returns today for follow-up.  She is now at home.  She is voiding spontaneously.  She does report urgency and urge incontinence.  This is occurring both daytime and nighttime.  She is using 2-3 adult pull-ups per day.  She does report a slight discomfort with voiding.  No gross hematuria. She has been on Gemtesa for approximately 2 months.  Portions of the  above documentation were copied from a prior visit for review purposes only.   Past Medical History:  Past Medical History:  Diagnosis Date   Acid reflux disease    Anemia    Anxiety    Asthma    Breast cancer ([place])    left side   Chronic back pain    Chronic kidney disease    Diabetes mellitus without complication ([place])    Diverticulitis    Hypertension    Lumbosacral spondylosis    Per previous PCP records   Sleep apnea    Thyroid disease     Past Surgical History:  Past Surgical History:  Procedure Laterality Date   ABDOMINAL HYSTERECTOMY     BACK SURGERY     BIOPSY  02/19/2020   Procedure: BIOPSY;  Surgeon: Eloise Harman, DO;  [facility];  Service: Endoscopy;;   BREAST LUMPECTOMY Left 2003   BREAST SURGERY     COLONOSCOPY WITH PROPOFOL N/A 02/19/2020   non-bleeding internal hemorrhoids, many small-mouthed diverticula in entire colon.   ESOPHAGOGASTRODUODENOSCOPY (EGD) WITH PROPOFOL N/A 02/19/2020    benign-appearing esophageal stenosis s/p dilation, gastritis s/p biopsy, normal duodenum. Negative H.pylori, negative duodenal biopsy.    GIVENS CAPSULE STUDY N/A 06/14/2020   Procedure: GIVENS CAPSULE STUDY;  Surgeon: Eloise Harman, DO;  [facility];  Service: Endoscopy;  Laterality: N/A;  7:30am   THYROIDECTOMY     tummy tuck     at time of breast surgery 2003  Allergies:  Allergies  Allergen Reactions   Codeine Nausea And Vomiting   Hydrochlorothiazide Other (See Comments)   Lisinopril Nausea And Vomiting   Oxybutynin Other (See Comments)   Penicillins Hives    Family History:  Family History  Problem Relation Age of Onset   Diabetes Mother    Thyroid disease Mother    Colon cancer Neg Hx     Social History:  Social History   Tobacco Use   Smoking status: Never   Smokeless tobacco: Never  Vaping Use   Vaping Use: Never used  Substance Use Topics   Alcohol use: Not Currently   Drug use: Not Currently     ROS: Constitutional:  Negative for fever, chills, weight loss CV: Negative for chest pain, previous MI, hypertension Respiratory:  Negative for shortness of breath, wheezing, sleep apnea, frequent cough GI:  Negative for nausea, vomiting, bloody stool, GERD  Physical exam: BP (!) 160/80 (BP Location: Right Arm)    Pulse 83    Ht 5\' 3"  (1.6 m)    Wt 178 lb (80.7 kg)    BMI 31.53 kg/m  GENERAL APPEARANCE:  Well appearing, well developed, well nourished, NAD HEENT:  Atraumatic, normocephalic, oropharynx clear NECK:  Supple without lymphadenopathy or thyromegaly ABDOMEN:  Soft, non-tender, no masses EXTREMITIES:  Moves all extremities well, without clubbing, cyanosis, or edema NEUROLOGIC:  Alert and oriented x 3, using walker, CN II-XII grossly intact MENTAL STATUS:  appropriate BACK:  Non-tender to palpation, No CVAT SKIN:  Warm, dry, and intact   Results: U/A:  6-10 WBC, 0-2 RBC, mod bacteria, >10 epithelial cells  PVR:  2 ml

## 2021-07-26 NOTE — Progress Notes (Signed)
post void residual =2   Urological Symptom Review  Patient is experiencing the following symptoms: Get up at night to urinate Leakage of urine   Review of Systems  Gastrointestinal (upper)  : Nausea Indigestion/heartburn  Gastrointestinal (lower) : Diarrhea  Constitutional : Fatigue  Skin: Negative for skin symptoms  Eyes: Negative for eye symptoms  Ear/Nose/Throat : Negative for Ear/Nose/Throat symptoms  Hematologic/Lymphatic: Negative for Hematologic/Lymphatic symptoms  Cardiovascular : Negative for cardiovascular symptoms  Respiratory : Negative for respiratory symptoms  Endocrine: Negative for endocrine symptoms  Musculoskeletal: Negative for musculoskeletal symptoms  Neurological: Headaches Dizziness  Psychologic: Negative for psychiatric symptoms

## 2021-07-27 ENCOUNTER — Ambulatory Visit (HOSPITAL_COMMUNITY): Payer: Commercial Managed Care - HMO

## 2021-08-02 ENCOUNTER — Ambulatory Visit (HOSPITAL_COMMUNITY)
Admission: RE | Admit: 2021-08-02 | Discharge: 2021-08-02 | Disposition: A | Discharge status: Home / Self Care | Payer: Medicare Other | Source: Ambulatory Visit | Attending: Nephrology | Admitting: Nephrology

## 2021-08-02 ENCOUNTER — Other Ambulatory Visit (HOSPITAL_COMMUNITY)
Admission: RE | Admit: 2021-08-02 | Discharge: 2021-08-02 | Disposition: A | Discharge status: Home / Self Care | Payer: Medicare Other | Source: Ambulatory Visit | Attending: Nephrology | Admitting: Nephrology

## 2021-08-02 ENCOUNTER — Other Ambulatory Visit (HOSPITAL_COMMUNITY): Payer: Self-pay | Admitting: Nephrology

## 2021-08-02 ENCOUNTER — Other Ambulatory Visit: Payer: Self-pay

## 2021-08-02 DIAGNOSIS — R42 Dizziness and giddiness: Secondary | ICD-10-CM

## 2021-08-02 DIAGNOSIS — I129 Hypertensive chronic kidney disease with stage 1 through stage 4 chronic kidney disease, or unspecified chronic kidney disease: Secondary | ICD-10-CM | POA: Insufficient documentation

## 2021-08-02 DIAGNOSIS — E1129 Type 2 diabetes mellitus with other diabetic kidney complication: Secondary | ICD-10-CM | POA: Diagnosis present

## 2021-08-02 DIAGNOSIS — N189 Chronic kidney disease, unspecified: Secondary | ICD-10-CM | POA: Diagnosis present

## 2021-08-02 DIAGNOSIS — E1122 Type 2 diabetes mellitus with diabetic chronic kidney disease: Secondary | ICD-10-CM | POA: Insufficient documentation

## 2021-08-02 DIAGNOSIS — R809 Proteinuria, unspecified: Secondary | ICD-10-CM | POA: Insufficient documentation

## 2021-08-02 LAB — URINALYSIS, COMPLETE (UACMP) WITH MICROSCOPIC
Bacteria, UA: NONE SEEN
Bilirubin Urine: NEGATIVE
Glucose, UA: NEGATIVE mg/dL
Hgb urine dipstick: NEGATIVE
Ketones, ur: NEGATIVE mg/dL
Nitrite: NEGATIVE
Protein, ur: 100 mg/dL — AB
RBC / HPF: NONE SEEN RBC/hpf (ref 0–5)
Specific Gravity, Urine: 1.02 (ref 1.005–1.030)
pH: 6 (ref 5.0–8.0)

## 2021-08-02 LAB — RENAL FUNCTION PANEL
Albumin: 3.8 g/dL (ref 3.5–5.0)
Anion gap: 7 (ref 5–15)
BUN: 21 mg/dL (ref 8–23)
CO2: 24 mmol/L (ref 22–32)
Calcium: 9.1 mg/dL (ref 8.9–10.3)
Chloride: 102 mmol/L (ref 98–111)
Creatinine, Ser: 1.1 mg/dL — ABNORMAL HIGH (ref 0.44–1.00)
GFR, Estimated: 52 mL/min — ABNORMAL LOW (ref >60)
Glucose, Bld: 181 mg/dL — ABNORMAL HIGH (ref 70–99)
Phosphorus: 2.7 mg/dL (ref 2.5–4.6)
Potassium: 3.7 mmol/L (ref 3.5–5.1)
Sodium: 133 mmol/L — ABNORMAL LOW (ref 135–145)

## 2021-08-02 LAB — CBC
HCT: 40.9 % (ref 36.0–46.0)
Hemoglobin: 13.2 g/dL (ref 12.0–15.0)
MCH: 27.7 pg (ref 26.0–34.0)
MCHC: 32.3 g/dL (ref 30.0–36.0)
MCV: 85.7 fL (ref 80.0–100.0)
Platelets: 268 10*3/uL (ref 150–400)
RBC: 4.77 MIL/uL (ref 3.87–5.11)
RDW: 13.7 % (ref 11.5–15.5)
WBC: 6.5 10*3/uL (ref 4.0–10.5)
nRBC: 0 % (ref 0.0–0.2)

## 2021-08-02 LAB — PROTEIN / CREATININE RATIO, URINE
Creatinine, Urine: 141.39 mg/dL
Protein Creatinine Ratio: 0.69 mg/mg{Cre} — ABNORMAL HIGH (ref 0.00–0.15)
Total Protein, Urine: 98 mg/dL

## 2021-08-04 LAB — URINE CULTURE

## 2021-08-05 ENCOUNTER — Other Ambulatory Visit (HOSPITAL_COMMUNITY): Payer: Self-pay | Admitting: Nephrology

## 2021-08-05 DIAGNOSIS — N3289 Other specified disorders of bladder: Secondary | ICD-10-CM

## 2021-08-05 DIAGNOSIS — E1122 Type 2 diabetes mellitus with diabetic chronic kidney disease: Secondary | ICD-10-CM

## 2021-08-05 DIAGNOSIS — R350 Frequency of micturition: Secondary | ICD-10-CM

## 2021-08-08 ENCOUNTER — Other Ambulatory Visit: Payer: Self-pay

## 2021-08-08 ENCOUNTER — Ambulatory Visit (HOSPITAL_COMMUNITY)
Admission: RE | Admit: 2021-08-08 | Discharge: 2021-08-08 | Disposition: A | Discharge status: Home / Self Care | Payer: Medicare Other | Source: Ambulatory Visit | Attending: Family Medicine | Admitting: Family Medicine

## 2021-08-08 DIAGNOSIS — Z1231 Encounter for screening mammogram for malignant neoplasm of breast: Secondary | ICD-10-CM | POA: Insufficient documentation

## 2021-08-09 ENCOUNTER — Ambulatory Visit: Payer: Medicare Other | Admitting: Podiatry

## 2021-08-15 ENCOUNTER — Ambulatory Visit (HOSPITAL_COMMUNITY)
Admission: RE | Admit: 2021-08-15 | Discharge: 2021-08-15 | Disposition: A | Discharge status: Home / Self Care | Payer: Medicare Other | Source: Ambulatory Visit | Attending: Nephrology | Admitting: Nephrology

## 2021-08-15 ENCOUNTER — Other Ambulatory Visit: Payer: Self-pay

## 2021-08-15 DIAGNOSIS — N3289 Other specified disorders of bladder: Secondary | ICD-10-CM | POA: Diagnosis present

## 2021-08-15 DIAGNOSIS — E1122 Type 2 diabetes mellitus with diabetic chronic kidney disease: Secondary | ICD-10-CM | POA: Insufficient documentation

## 2021-08-15 DIAGNOSIS — N181 Chronic kidney disease, stage 1: Secondary | ICD-10-CM | POA: Insufficient documentation

## 2021-08-15 DIAGNOSIS — R350 Frequency of micturition: Secondary | ICD-10-CM | POA: Insufficient documentation

## 2021-08-23 ENCOUNTER — Ambulatory Visit (INDEPENDENT_AMBULATORY_CARE_PROVIDER_SITE_OTHER): Payer: Medicare Other | Admitting: Urology

## 2021-08-23 ENCOUNTER — Encounter: Payer: Self-pay | Admitting: Urology

## 2021-08-23 ENCOUNTER — Other Ambulatory Visit: Payer: Self-pay

## 2021-08-23 VITALS — BP 149/85 | HR 90 | Wt 174.0 lb

## 2021-08-23 DIAGNOSIS — N3941 Urge incontinence: Secondary | ICD-10-CM

## 2021-08-23 DIAGNOSIS — R339 Retention of urine, unspecified: Secondary | ICD-10-CM

## 2021-08-23 DIAGNOSIS — Z87898 Personal history of other specified conditions: Secondary | ICD-10-CM

## 2021-08-23 DIAGNOSIS — Z87440 Personal history of urinary (tract) infections: Secondary | ICD-10-CM

## 2021-08-23 DIAGNOSIS — N390 Urinary tract infection, site not specified: Secondary | ICD-10-CM

## 2021-08-23 LAB — MICROSCOPIC EXAMINATION
Epithelial Cells (non renal): >10 /hpf — AB (ref 0–10)
RBC, Urine: NONE SEEN /hpf (ref 0–2)
Renal Epithel, UA: NONE SEEN /hpf

## 2021-08-23 LAB — URINALYSIS, ROUTINE W REFLEX MICROSCOPIC
Bilirubin, UA: NEGATIVE
Glucose, UA: NEGATIVE
Ketones, UA: NEGATIVE
Nitrite, UA: NEGATIVE
RBC, UA: NEGATIVE
Specific Gravity, UA: 1.02 (ref 1.005–1.030)
Urobilinogen, Ur: 0.2 mg/dL (ref 0.2–1.0)
pH, UA: 6.5 (ref 5.0–7.5)

## 2021-08-23 MED ORDER — MIRABEGRON ER 50 MG PO TB24: 50.0000 mg | ORAL_TABLET | Freq: Every day | ORAL | 3 refills | Status: DC

## 2021-08-23 NOTE — Progress Notes (Signed)
Assessment: 1. Urge incontinence   2. History of urinary retention   3. History of UTI     Plan: Continue Myrbetriq 50 mg daily.  Samples and prescription sent Return to office in 3 months  Chief Complaint:  Chief Complaint  Patient presents with   Urinary Incontinence    History of Present Illness:  Stephanie Moreno is a 76 y.o. year old female who is seen for further evaluation of urinary retention.  She was admitted to the hospital on 04/11/2021 for generalized weakness.  She underwent a lumbar laminectomy in September 2022.  After returning home she developed significant weakness.  She was admitted with severe dehydration and acute on chronic renal insufficiency.  Urinalysis showed positive nitrite, >50 RBCs, >50 WBCs, and many bacteria.  Urine culture grew multiple species.  She did have problems with urinary retention while in the hospital.  She required in and out catheterization and eventually Foley catheter was placed prior to discharge. Her Foley has been draining well.  No gross hematuria. No prior history of urinary retention.  She denies any problems voiding prior to her hospitalization. She does report some problems with constipation.  Her Foley was removed on 04/28/2021.   She began voiding spontaneously following catheter removal.  A urine culture from 05/05/2021 grew >100 K ESBL.  She was started on Cipro on 05/09/2021.   She reported some episodes of incontinence.  No fevers or chills.  No gross hematuria. Urinalysis from 07/07/2021 showed no WBCs, no RBCs, no bacteria.  Urine culture grew <10K mixed flora.  At her visit in 1/23, she was voiding spontaneously.  She reported urgency and urge incontinence, occurring both daytime and nighttime.  She was using 2-3 adult pull-ups per day.  She noted a slight discomfort with voiding.  No gross hematuria. She had been on Gemtesa for approximately 2 months. She was given a trial of Myrbetriq 50 mg daily in 1/23.  She returns  today for follow-up.  She has noted improvement in her symptoms with Myrbetriq.  She has decreased urgency and incontinence.  Her nocturia has improved as well.  No side effects from the medication.  No dysuria or gross hematuria.   Portions of the above documentation were copied from a prior visit for review purposes only.   Past Medical History:  Past Medical History:  Diagnosis Date   Acid reflux disease    Anemia    Anxiety    Asthma    Breast cancer ([place])    left side   Chronic back pain    Chronic kidney disease    Diabetes mellitus without complication ([place])    Diverticulitis    Hypertension    Lumbosacral spondylosis    Per previous PCP records   Sleep apnea    Thyroid disease     Past Surgical History:  Past Surgical History:  Procedure Laterality Date   ABDOMINAL HYSTERECTOMY     BACK SURGERY     BIOPSY  02/19/2020   Procedure: BIOPSY;  Surgeon: Eloise Harman, DO;  [facility];  Service: Endoscopy;;   BREAST LUMPECTOMY Left 2003   BREAST SURGERY     COLONOSCOPY WITH PROPOFOL N/A 02/19/2020   non-bleeding internal hemorrhoids, many small-mouthed diverticula in entire colon.   ESOPHAGOGASTRODUODENOSCOPY (EGD) WITH PROPOFOL N/A 02/19/2020    benign-appearing esophageal stenosis s/p dilation, gastritis s/p biopsy, normal duodenum. Negative H.pylori, negative duodenal biopsy.    GIVENS CAPSULE STUDY N/A 06/14/2020   Procedure: GIVENS  CAPSULE STUDY;  Surgeon: Eloise Harman, DO;  [facility];  Service: Endoscopy;  Laterality: N/A;  7:30am   THYROIDECTOMY     tummy tuck     at time of breast surgery 2003    Allergies:  Allergies  Allergen Reactions   Codeine Nausea And Vomiting   Hydrochlorothiazide Other (See Comments)   Lisinopril Nausea And Vomiting   Oxybutynin Other (See Comments)   Penicillins Hives    Family History:  Family History  Problem Relation Age of Onset   Diabetes Mother    Thyroid disease Mother    Colon  cancer Neg Hx     Social History:  Social History   Tobacco Use   Smoking status: Never   Smokeless tobacco: Never  Vaping Use   Vaping Use: Never used  Substance Use Topics   Alcohol use: Not Currently   Drug use: Not Currently    ROS: Constitutional:  Negative for fever, chills, weight loss CV: Negative for chest pain, previous MI, hypertension Respiratory:  Negative for shortness of breath, wheezing, sleep apnea, frequent cough GI:  Negative for nausea, vomiting, bloody stool, GERD  Physical exam: BP (!) 149/85    Pulse 90    Wt 174 lb (78.9 kg)    BMI 30.82 kg/m  GENERAL APPEARANCE:  Well appearing, well developed, well nourished, NAD HEENT:  Atraumatic, normocephalic, oropharynx clear NECK:  Supple without lymphadenopathy or thyromegaly ABDOMEN:  Soft, non-tender, no masses EXTREMITIES:  Moves all extremities well, without clubbing, cyanosis, or edema NEUROLOGIC:  Alert and oriented x 3, normal gait, CN II-XII grossly intact MENTAL STATUS:  appropriate BACK:  Non-tender to palpation, No CVAT SKIN:  Warm, dry, and intact  Results: U/A: 6-10 WBCs, >10 epithelial cells, few bacteria

## 2021-09-16 ENCOUNTER — Encounter: Payer: Self-pay | Admitting: Nurse Practitioner

## 2021-09-16 ENCOUNTER — Other Ambulatory Visit: Payer: Self-pay

## 2021-09-16 ENCOUNTER — Ambulatory Visit (INDEPENDENT_AMBULATORY_CARE_PROVIDER_SITE_OTHER): Payer: Medicare Other | Admitting: Nurse Practitioner

## 2021-09-16 VITALS — BP 143/87 | HR 81 | Ht 63.0 in | Wt 176.8 lb

## 2021-09-16 DIAGNOSIS — I10 Essential (primary) hypertension: Secondary | ICD-10-CM

## 2021-09-16 DIAGNOSIS — E782 Mixed hyperlipidemia: Secondary | ICD-10-CM

## 2021-09-16 DIAGNOSIS — N1832 Chronic kidney disease, stage 3b: Secondary | ICD-10-CM | POA: Diagnosis not present

## 2021-09-16 DIAGNOSIS — E1122 Type 2 diabetes mellitus with diabetic chronic kidney disease: Secondary | ICD-10-CM | POA: Diagnosis not present

## 2021-09-16 LAB — POCT GLYCOSYLATED HEMOGLOBIN (HGB A1C): HbA1c, POC (controlled diabetic range): 7.8 % — AB (ref 0.0–7.0)

## 2021-09-16 MED ORDER — GLIPIZIDE ER 5 MG PO TB24: 5.0000 mg | ORAL_TABLET | Freq: Every day | ORAL | 3 refills | Status: DC

## 2021-09-16 NOTE — Progress Notes (Signed)
? ? ?     09/16/2021, 9:28 AM ? ?Endocrinology follow-up note ? ? ?Subjective:  ? ? Patient ID: Stephanie Moreno, female    DOB: 1945-07-06.  ?Stephanie Moreno is being seen in follow-up after she was seen in consultation for management of currently uncontrolled symptomatic diabetes requested by  Margretta Sidle, MD. ? ? ?Past Medical History:  ?Diagnosis Date  ? Acid reflux disease   ? Anemia   ? Anxiety   ? Asthma   ? Breast cancer ([place])   ? left side  ? Chronic back pain   ? Chronic kidney disease   ? Diabetes mellitus without complication ([place])   ? Diverticulitis   ? Hypertension   ? Lumbosacral spondylosis   ? Per previous PCP records  ? Sleep apnea   ? Thyroid disease   ? ? ?Past Surgical History:  ?Procedure Laterality Date  ? ABDOMINAL HYSTERECTOMY    ? BACK SURGERY    ? BIOPSY  02/19/2020  ? Procedure: BIOPSY;  Surgeon: Eloise Harman, DO;  [facility];  Service: Endoscopy;;  ? BREAST LUMPECTOMY Left 2003  ? BREAST SURGERY    ? COLONOSCOPY WITH PROPOFOL N/A 02/19/2020  ? non-bleeding internal hemorrhoids, many small-mouthed diverticula in entire colon.  ? ESOPHAGOGASTRODUODENOSCOPY (EGD) WITH PROPOFOL N/A 02/19/2020  ?  benign-appearing esophageal stenosis s/p dilation, gastritis s/p biopsy, normal duodenum. Negative H.pylori, negative duodenal biopsy.   ? GIVENS CAPSULE STUDY N/A 06/14/2020  ? Procedure: GIVENS CAPSULE STUDY;  Surgeon: Eloise Harman, DO;  [facility];  Service: Endoscopy;  Laterality: N/A;  7:30am  ? THYROIDECTOMY    ? tummy tuck    ? at time of breast surgery 2003  ? ? ?Social History  ? ?Socioeconomic History  ? Marital status: Divorced  ?  Spouse name: Not on file  ? Number of children: Not on file  ? Years of education: Not on file  ? Highest education level: Not on file  ?Occupational History  ? Occupation: missionary  ?  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 Smoking status: Never  ? Smokeless tobacco: Never  ?Vaping Use  ?  Vaping Use: Never used  ?Substance and Sexual Activity  ? Alcohol use: Not Currently  ? Drug use: Not Currently  ? Sexual activity: Not on file  ?Other Topics Concern  ? Not on file  ?Social History Narrative  ? Not on file  ? ?Social Determinants of Health  ? ?Financial Resource Strain: Not on file  ?Food Insecurity: Not on file  ?Transportation Needs: Not on file  ?Physical Activity: Not on file  ?Stress: Not on file  ?Social Connections: Not on file  ? ? ?Family History  ?Problem Relation Age of Onset  ? Diabetes Mother   ? Thyroid disease Mother   ? Colon cancer Neg Hx   ? ? ?Outpatient Encounter Medications as of 09/16/2021  ?Medication Sig  ? acetaminophen (TYLENOL) 325 MG tablet Take 650 mg by mouth every 4 (four) hours as needed.  ? amitriptyline (ELAVIL) 25 MG tablet Take 25 mg by mouth at bedtime.  ? Azelastine HCl 0.15 % SOLN Place 1 spray into both nostrils daily.  ? calcium-vitamin D (OSCAL WITH D) 500-200 MG-UNIT TABS tablet Take 1 tablet by mouth 3 (three) times daily.  ? docusate sodium (COLACE) 100 MG capsule TAKE 1 CAPSULE BY MOUTH TWICE A DAY  ? fluticasone (FLONASE) 50 MCG/ACT nasal spray  Place 2 sprays into both nostrils daily.  ? Fluticasone Furoate (ARNUITY ELLIPTA) 200 MCG/ACT AEPB Inhale 1 puff into the lungs daily.  ? furosemide (LASIX) 20 MG tablet Take 1 tablet (20 mg total) by mouth every other day. Takes 40 mg every other day. On alternate days take 80 mg  ? gabapentin (NEURONTIN) 300 MG capsule Take 2 capsules (600 mg total) by mouth at bedtime.  ? insulin degludec (TRESIBA FLEXTOUCH) 100 UNIT/ML FlexTouch Pen Inject 20 Units into the skin at bedtime.  ? LINZESS 145 MCG CAPS capsule Take 145 mcg by mouth every morning.  ? losartan (COZAAR) 100 MG tablet Take 100 mg by mouth daily.  ? losartan (COZAAR) 50 MG tablet Take 50 mg by mouth daily.  ? lubiprostone (AMITIZA) 8 MCG capsule Take 8 mcg by mouth 2 (two) times daily with a meal.  ? magnesium oxide (MAG-OX) 400 MG tablet Take 400 mg  by mouth daily.  ? meclizine (ANTIVERT) 25 MG tablet Take 25 mg by mouth 3 (three) times daily as needed for dizziness.  ? methocarbamol (ROBAXIN) 500 MG tablet Take 1 tablet (500 mg total) by mouth every 6 (six) hours as needed for muscle spasms.  ? mirabegron ER (MYRBETRIQ) 50 MG TB24 tablet Take 1 tablet (50 mg total) by mouth daily.  ? Multiple Vitamins-Minerals (MULTIVITAMIN WITH MINERALS) tablet Take 1 tablet by mouth daily.  ? ondansetron (ZOFRAN-ODT) 4 MG disintegrating tablet Take 4 mg by mouth every 8 (eight) hours as needed.  ? pantoprazole (PROTONIX) 40 MG tablet Take 1 tablet (40 mg total) by mouth daily. 30 minutes before breakfast  ? polyethylene glycol (MIRALAX / GLYCOLAX) 17 g packet Take 17 g by mouth daily as needed for mild constipation. (Patient taking differently: Take 17 g by mouth daily.)  ? rosuvastatin (CRESTOR) 10 MG tablet Take 10 mg by mouth at bedtime.   ? sucralfate (CARAFATE) 1 g tablet Take 1 g by mouth 3 (three) times daily.  ? traMADol (ULTRAM) 50 MG tablet Take 50 mg by mouth every 6 (six) hours as needed.  ? TRULICITY 3 SE/8.3TD SOPN SMARTSIG:3 Milligram(s) SUB-Q Once a Week  ? [DISCONTINUED] glipiZIDE (GLUCOTROL XL) 10 MG 24 hr tablet Take 10 mg by mouth every morning.  ? glipiZIDE (GLUCOTROL XL) 5 MG 24 hr tablet Take 1 tablet (5 mg total) by mouth daily with breakfast.  ? [DISCONTINUED] carvedilol (COREG) 25 MG tablet Take 25 mg by mouth 2 (two) times daily. (Patient not taking: Reported on 09/16/2021)  ? [DISCONTINUED] carvedilol (COREG) 25 MG tablet Take by mouth. (Patient not taking: Reported on 09/16/2021)  ? [DISCONTINUED] glipiZIDE (GLUCOTROL XL) 5 MG 24 hr tablet Take 1 tablet (5 mg total) by mouth daily with breakfast. (Patient not taking: Reported on 09/16/2021)  ? [DISCONTINUED] linagliptin (TRADJENTA) 5 MG TABS tablet Take 1 tablet (5 mg total) by mouth daily. (Patient not taking: Reported on 09/16/2021)  ? [DISCONTINUED] losartan (COZAAR) 25 MG tablet Take 1 tablet (25  mg total) by mouth daily. (Patient not taking: Reported on 09/16/2021)  ? [DISCONTINUED] metoprolol tartrate (LOPRESSOR) 50 MG tablet Take 50 mg by mouth 2 (two) times daily. (Patient not taking: Reported on 09/16/2021)  ? [DISCONTINUED] verapamil (CALAN-SR) 180 MG CR tablet Take 1 tablet (180 mg total) by mouth daily. (Patient not taking: Reported on 09/16/2021)  ? ?No facility-administered encounter medications on file as of 09/16/2021.  ? ? ?ALLERGIES: ?Allergies  ?Allergen Reactions  ? Codeine Nausea And Vomiting and Anaphylaxis  ?  Carvedilol   ?  Cold sweats, diarrhea, severe hot flashes  ? Hydrochlorothiazide Other (See Comments)  ?  Other reaction(s): stomach upset  ? Lisinopril Nausea And Vomiting  ?  Other reaction(s): cough  ? Oxybutynin Other (See Comments)  ? Penicillins Hives  ? ? ?VACCINATION STATUS: ?Immunization History  ?Administered Date(s) Administered  ? Fluad Quad(high Dose 65+) 03/17/2021  ? Pneumococcal Conjugate-13 09/02/2014  ? Pneumococcal Polysaccharide-23 07/03/2010  ? Tdap 07/03/2000  ? ? ?Diabetes ?She presents for her follow-up diabetic visit. She has type 2 diabetes mellitus. Onset time: Diagnosed at approx age of 64. Her disease course has been improving. There are no hypoglycemic associated symptoms. Associated symptoms include fatigue and foot paresthesias. Pertinent negatives for diabetes include no polydipsia, no polyuria and no weight loss. There are no hypoglycemic complications. Symptoms are stable. Diabetic complications include a CVA, nephropathy and peripheral neuropathy. Risk factors for coronary artery disease include diabetes mellitus, dyslipidemia, hypertension, post-menopausal and sedentary lifestyle. Current diabetic treatment includes oral agent (monotherapy) and insulin injections (and Trulicity). She is compliant with treatment most of the time. Her weight is fluctuating minimally. She is following a generally healthy diet. When asked about meal planning, she reported  none. She has not had a previous visit with a dietitian. She rarely participates in exercise. Her home blood glucose trend is decreasing steadily. Her overall blood glucose range is 140-180 mg/dl. (She

## 2021-09-16 NOTE — Patient Instructions (Signed)
Diabetes Mellitus Emergency Preparedness Plan ?A diabetes emergency preparedness plan is a checklist to make sure you have everything you need to manage your diabetes in case of an emergency, such as an evacuation, natural disaster, national security emergency, or pandemic lockdown. ?Managing your diabetes is something you have to do all day every day. The American Diabetes Association and the American College of Endocrinology both recommend putting together an emergency diabetes kit. Your kit should include important information and documents as well as all the supplies you will need to manage your diabetes for at least 1 week. Store it in a portable, waterproof bag or container. The best time to start making your emergency kit is now. ?How to make your emergency kit ?Collect information and documents ?Include the following information and documents in your kit: ?The type of diabetes you have. ?A copy of your health insurance cards and photo ID. ?A list of all your other medical conditions, allergies, and surgeries. ?A list of all your medicines and doses with the contact information for your pharmacy. Ask your health care provider for a list of your current medicines. ?Any recent lab results, including your latest hemoglobin A1C (HbA1C). ?The make, model, and serial number of your insulin pump, if you use one. Also include contact information for the manufacturer. ?Contact information for people who should be notified in case of an emergency. Include your health care provider's name, address, and phone number. ?Collect diabetes care items ?Include the following diabetes care items in your kit: ?At least a 1-week supply of: ?Oral medicines. ?Insulin. ?Blood glucose testing supplies. These include testing strips, lancets, and extra batteries for your blood glucose monitor and pump. ?A charger for the continuous glucose monitor (CGM) receiver and pump. ?Any extra supplies needed for your CGM or pump. ?A supply of  glucagon, glucose tablets, juice, soda, or hard candy in case of hypoglycemia. ?Coolers or cold packs. ?A safe container for syringes, needles, and lancets. ? ?Other preparations ?Other things to consider doing as part of your emergency plan: ?Make sure that your mobile phone is charged and that you have an extra charger, cable, or batteries. ?Choose a meeting place for family members. ?Wear a medical alert or ID bracelet. ?If you have a child with diabetes, make sure your child's school has a copy of his or her emergency plan, including the name of the staff member who will assist your child. ?Where to find more information ?American Diabetes Association: www.diabetes.org ?Centers for Disease Control and Prevention: blogs.cdc.gov ?Summary ?A diabetes emergency preparedness plan is a checklist to make sure you have everything you need in case of an emergency. ?Your kit should include important information and documents as well as all the supplies you will need to manage your condition for at least 1 week. ?Store your kit in a portable, waterproof bag or container. ?The best time to start making your emergency kit is now. ?This information is not intended to replace advice given to you by your health care provider. Make sure you discuss any questions you have with your health care provider. ?Document Revised: 12/25/2019 Document Reviewed: 12/25/2019 ?Elsevier Patient Education ? 2022 Elsevier Inc. ? ?

## 2021-09-19 ENCOUNTER — Ambulatory Visit (INDEPENDENT_AMBULATORY_CARE_PROVIDER_SITE_OTHER): Payer: Medicare Other | Admitting: Specialist

## 2021-09-19 ENCOUNTER — Other Ambulatory Visit: Payer: Self-pay

## 2021-09-19 ENCOUNTER — Encounter: Payer: Self-pay | Admitting: Specialist

## 2021-09-19 ENCOUNTER — Ambulatory Visit (INDEPENDENT_AMBULATORY_CARE_PROVIDER_SITE_OTHER): Payer: Medicare Other

## 2021-09-19 VITALS — BP 138/87 | HR 88 | Ht 63.0 in | Wt 177.0 lb

## 2021-09-19 DIAGNOSIS — G9519 Other vascular myelopathies: Secondary | ICD-10-CM | POA: Diagnosis not present

## 2021-09-19 DIAGNOSIS — Z981 Arthrodesis status: Secondary | ICD-10-CM

## 2021-09-19 DIAGNOSIS — M5136 Other intervertebral disc degeneration, lumbar region: Secondary | ICD-10-CM | POA: Diagnosis not present

## 2021-09-19 MED ORDER — PREGABALIN 50 MG PO CAPS: 50.0000 mg | ORAL_CAPSULE | Freq: Every day | ORAL | 2 refills | Status: DC

## 2021-09-19 NOTE — Patient Instructions (Addendum)
Avoid frequent bending and stooping  ?No lifting greater than 10 lbs. ?May use ice or moist heat for pain. ?Weight loss is of benefit. ?Due to renal disease you should not use arthritis medications and tramadol is a reasonable med for you to use  ?Exercise is important to improve your indurance and does allow people to function better inspite of back pain. ?Lyrica 50 mg po at night, stop gabapentin ?Tramadol for more severe pain.  ?

## 2021-09-19 NOTE — Progress Notes (Signed)
? ?Office Visit Note ?  ?Patient: Stephanie Moreno           ?Date of Birth: [birth year]           ?MRN: [id number] ?Visit Date: 09/19/2021 ?             ?Requested by: Margretta Sidle, MD ?[address] ?[place],  [place] [address] ?PCP: Margretta Sidle, MD ? ? ?Assessment & Plan: ?Visit Diagnoses:  ?1. S/P lumbar fusion   ?2. DDD (degenerative disc disease), lumbar   ?3. 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 ? ?Plan: Avoid frequent bending and stooping  ?No lifting greater than 10 lbs. ?May use ice or moist heat for pain. ?Weight loss is of benefit. ?Due to renal disease you should not use arthritis medications and tramadol is a reasonable med for you to use  ?Exercise is important to improve your indurance and does allow people to function better inspite of back pain. ?Lyrica 50 mg po at night, stop gabapentin ?Tramadol for more severe pain.  ? ?Follow-Up Instructions: Return in about 3 weeks (around 10/10/2021).  ? ?Orders:  ?Orders Placed This Encounter  ?Procedures  ? XR Lumbar Spine 2-3 Views  ? ?No orders of the defined types were placed in this encounter. ? ? ? ? Procedures: ?No procedures performed ? ? ?Clinical Data: ?No additional findings. ? ? ?Subjective: ?Chief Complaint  ?Patient presents with  ? Lower Back - Follow-up  ? ? ?HPI ? ?Review of Systems ? ? ?Objective: ?Vital Signs: BP 138/87 (BP Location: Right Arm, Patient Position: Sitting)   Pulse 88   Ht '5\' 3"'$  (1.6 m)   Wt 177 lb (80.3 kg)   BMI 31.35 kg/m?  ? ?Physical Exam ? ?Ortho Exam ? ?Specialty Comments:  ?No specialty comments available. ? ?Imaging: ?XR Lumbar Spine 2-3 Views ? ?Result Date: 09/19/2021 ?AP and lateral flexion and extension radiographs of the lumbar spine demonstrate pedicle screws and rods L5-S1 in good position and alignment. Interbody cage with no sign of luceny, flexion and extension radiographs show about 2 mm of motion at L5-S1. There is anterior lip spurs developing at L4-5 with mild disc narrowing.   ? ? ?PMFS  History: ?Patient Active Problem List  ? Diagnosis Date Noted  ? Urge incontinence 07/26/2021  ? Urinary retention 04/15/2021  ? Acute kidney injury ([place]) 04/12/2021  ? Acute renal failure superimposed on chronic kidney disease, unspecified CKD stage, unspecified acute renal failure type ([place]) 04/12/2021  ? Acute renal failure superimposed on stage 3b chronic kidney disease ([place]) 04/12/2021  ? Dehydration   ? Spondylolisthesis, lumbar region   ? Spinal stenosis, lumbar region, with neurogenic claudication   ? Dysphagia 03/18/2021  ? Speech disturbance 03/18/2021  ? Wheezing 03/18/2021  ? Ileus ([place]) 03/18/2021  ? Fusion of spine of lumbar region 03/15/2021  ? Cerebrovascular disease 12/20/2020  ? GERD (gastroesophageal reflux disease) 11/18/2020  ? Type 2 diabetes mellitus with stage 3b chronic kidney disease, without long-term current use of insulin ([place]) 07/12/2020  ? Mixed hyperlipidemia 07/12/2020  ? Essential hypertension, benign 07/12/2020  ? Iron deficiency anemia   ? Loss of weight 04/09/2020  ? Dyspepsia 01/28/2020  ? Constipation 01/28/2020  ? Lower abdominal pain 01/28/2020  ? Normocytic anemia 01/28/2020  ? ?Past Medical History:  ?Diagnosis Date  ? Acid reflux disease   ? Anemia   ? Anxiety   ? Asthma   ? Breast cancer ([place])   ? left side  ? Chronic back pain   ?  Chronic kidney disease   ? Diabetes mellitus without complication ([place])   ? Diverticulitis   ? Hypertension   ? Lumbosacral spondylosis   ? Per previous PCP records  ? Sleep apnea   ? Thyroid disease   ?  ?Family History  ?Problem Relation Age of Onset  ? Diabetes Mother   ? Thyroid disease Mother   ? Colon cancer Neg Hx   ?  ?Past Surgical History:  ?Procedure Laterality Date  ? ABDOMINAL HYSTERECTOMY    ? BACK SURGERY    ? BIOPSY  02/19/2020  ? Procedure: BIOPSY;  Surgeon: Eloise Harman, DO;  [facility];  Service: Endoscopy;;  ? BREAST LUMPECTOMY Left 2003  ? BREAST SURGERY    ? COLONOSCOPY WITH PROPOFOL N/A 02/19/2020  ?  non-bleeding internal hemorrhoids, many small-mouthed diverticula in entire colon.  ? ESOPHAGOGASTRODUODENOSCOPY (EGD) WITH PROPOFOL N/A 02/19/2020  ?  benign-appearing esophageal stenosis s/p dilation, gastritis s/p biopsy, normal duodenum. Negative H.pylori, negative duodenal biopsy.   ? GIVENS CAPSULE STUDY N/A 06/14/2020  ? Procedure: GIVENS CAPSULE STUDY;  Surgeon: Eloise Harman, DO;  [facility];  Service: Endoscopy;  Laterality: N/A;  7:30am  ? THYROIDECTOMY    ? tummy tuck    ? at time of breast surgery 2003  ? ?Social History  ? ?Occupational History  ? Occupation: missionary  ?  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 Smoking status: Never  ? Smokeless tobacco: Never  ?Vaping Use  ? Vaping Use: Never used  ?Substance and Sexual Activity  ? Alcohol use: Not Currently  ? Drug use: Not Currently  ? Sexual activity: Not on file  ? ? ? ? ? ? ?

## 2021-09-26 ENCOUNTER — Telehealth: Payer: Self-pay | Admitting: Nurse Practitioner

## 2021-09-26 ENCOUNTER — Other Ambulatory Visit: Payer: Self-pay

## 2021-09-26 MED ORDER — TRULICITY 3 MG/0.5ML ~~LOC~~ SOAJ: SUBCUTANEOUS | 1 refills | Status: DC

## 2021-09-26 NOTE — Telephone Encounter (Signed)
Pt calling requesting a refill on Trulicity.  ? ?[id number] [place]  [phone] ?Fax:  [phone] ?  ? ?

## 2021-09-26 NOTE — Telephone Encounter (Signed)
Medication has been sent to the requested pharmacy. ?

## 2021-09-29 ENCOUNTER — Ambulatory Visit: Payer: Medicare Other | Admitting: Specialist

## 2021-10-05 ENCOUNTER — Ambulatory Visit: Payer: Medicare Other | Admitting: Specialist

## 2021-10-05 DIAGNOSIS — N1831 Chronic kidney disease, stage 3a: Secondary | ICD-10-CM | POA: Insufficient documentation

## 2021-10-05 DIAGNOSIS — E1165 Type 2 diabetes mellitus with hyperglycemia: Secondary | ICD-10-CM | POA: Insufficient documentation

## 2021-10-06 DIAGNOSIS — G4700 Insomnia, unspecified: Secondary | ICD-10-CM | POA: Insufficient documentation

## 2021-10-06 DIAGNOSIS — G3184 Mild cognitive impairment, so stated: Secondary | ICD-10-CM | POA: Insufficient documentation

## 2021-10-06 DIAGNOSIS — C50919 Malignant neoplasm of unspecified site of unspecified female breast: Secondary | ICD-10-CM | POA: Insufficient documentation

## 2021-10-06 DIAGNOSIS — E041 Nontoxic single thyroid nodule: Secondary | ICD-10-CM | POA: Insufficient documentation

## 2021-10-06 DIAGNOSIS — R600 Localized edema: Secondary | ICD-10-CM | POA: Insufficient documentation

## 2021-10-06 DIAGNOSIS — R7989 Other specified abnormal findings of blood chemistry: Secondary | ICD-10-CM | POA: Insufficient documentation

## 2021-10-06 DIAGNOSIS — M545 Low back pain, unspecified: Secondary | ICD-10-CM | POA: Insufficient documentation

## 2021-10-11 ENCOUNTER — Telehealth: Payer: Self-pay

## 2021-10-11 ENCOUNTER — Ambulatory Visit
Admission: RE | Admit: 2021-10-11 | Discharge: 2021-10-11 | Disposition: A | Discharge status: Home / Self Care | Payer: Medicare Other | Source: Ambulatory Visit | Attending: Specialist | Admitting: Specialist

## 2021-10-11 DIAGNOSIS — R29818 Other symptoms and signs involving the nervous system: Secondary | ICD-10-CM

## 2021-10-11 DIAGNOSIS — M5136 Other intervertebral disc degeneration, lumbar region: Secondary | ICD-10-CM

## 2021-10-11 DIAGNOSIS — Z981 Arthrodesis status: Secondary | ICD-10-CM

## 2021-10-11 MED ORDER — GADOBENATE DIMEGLUMINE 529 MG/ML IV SOLN
17.0000 mL | Freq: Once | INTRAVENOUS | Status: AC | PRN
  Administered 2021-10-11: 17 mL via INTRAVENOUS

## 2021-10-11 NOTE — Telephone Encounter (Signed)
I called the patient and was not able to leave a voice message due to not having a voice mail set up. I wanted to see if the patient wanted her Trulicity sent to [facility] of [facility]. We received a fax from [place] requesting a refill of her Trulicity. ?

## 2021-10-12 NOTE — Progress Notes (Signed)
Overall the fusion and laminectomy at the next level have improved the MRI appearance and the is only mild narrowing at the next level L4-5, may wish to consider an epidural cortisone injection as opposed to more surgery.

## 2021-10-14 ENCOUNTER — Ambulatory Visit (INDEPENDENT_AMBULATORY_CARE_PROVIDER_SITE_OTHER): Payer: Medicare Other | Admitting: Specialist

## 2021-10-14 ENCOUNTER — Encounter: Payer: Self-pay | Admitting: Specialist

## 2021-10-14 VITALS — BP 180/77 | HR 81 | Ht 63.0 in | Wt 175.0 lb

## 2021-10-14 DIAGNOSIS — M48062 Spinal stenosis, lumbar region with neurogenic claudication: Secondary | ICD-10-CM | POA: Diagnosis not present

## 2021-10-14 DIAGNOSIS — M5416 Radiculopathy, lumbar region: Secondary | ICD-10-CM | POA: Diagnosis not present

## 2021-10-14 DIAGNOSIS — M4317 Spondylolisthesis, lumbosacral region: Secondary | ICD-10-CM

## 2021-10-14 MED ORDER — HYDROCODONE-ACETAMINOPHEN 10-325 MG PO TABS: 0.5000 | ORAL_TABLET | ORAL | 0 refills | Status: DC | PRN

## 2021-10-14 NOTE — Progress Notes (Signed)
? ?Office Visit Note ?  ?Patient: Stephanie Moreno           ?Date of Birth: [birth year]           ?MRN: [id number] ?Visit Date: 10/14/2021 ?             ?Requested by: Margretta Sidle, MD ?[address] ?[place],  [place] [address] ?PCP: Margretta Sidle, MD ? ? ?Assessment & Plan: ?Visit Diagnoses:  ?1. Radiculopathy, lumbar region   ?2. Spondylolisthesis at L5-S1 level   ?3. Spinal stenosis, lumbar region, with neurogenic claudication   ? ? ?Plan: Avoid bending, stooping and avoid lifting weights greater than 10 lbs. ?Avoid prolong standing and walking. ?Avoid frequent bending and stooping  ?No lifting greater than 10 lbs. ?May use ice or moist heat for pain. ?Weight loss is of benefit. ?Handicap license is approved. ?Dr. Romona Curls secretary/Assistant will call to arrange for epidural steroid injection   ? ?Follow-Up Instructions: No follow-ups on file.  ? ?Orders:  ?No orders of the defined types were placed in this encounter. ? ?No orders of the defined types were placed in this encounter. ? ? ? ? Procedures: ?No procedures performed ? ? ?Clinical Data: ?Findings:  ?Narrative & Impression ?CLINICAL DATA:  Low back pain. History of lumbar fusion six months ?ago ?  ?EXAM: ?MRI LUMBAR SPINE WITHOUT AND WITH CONTRAST ?  ?TECHNIQUE: ?Multiplanar and multiecho pulse sequences of the lumbar spine were ?obtained without and with intravenous contrast. ?  ?CONTRAST:  71m MULTIHANCE GADOBENATE DIMEGLUMINE 529 MG/ML IV SOLN ?  ?COMPARISON:  Lumbar MRI 10/22/2020 ?  ?FINDINGS: ?Segmentation:  5 lumbar vertebra ?  ?Alignment:  Normal ?  ?Vertebrae: Normal bone marrow. Negative for fracture or mass. No ?evidence of spinal infection. ?  ?Conus medullaris and cauda equina: Conus extends to the L2-3 level. ?Conus and cauda equina appear normal. ?  ?Paraspinal and other soft tissues: Bilateral renal cysts. No ?paraspinous mass, adenopathy, or fluid collection ?  ?Disc levels: ?  ?L1-2: Negative ?  ?L2-3: Negative ?  ?L3-4: Mild facet  degeneration. Negative for disc protrusion or ?stenosis ?  ?L4-5: Shallow central disc protrusion unchanged. Bilateral facet ?hypertrophy. Mild spinal stenosis improved likely due to laminotomy. ?Mild subarticular stenosis bilaterally. ?  ?L5-S1: Pedicle screw and interbody fusion. Negative for spinal or ?foraminal stenosis. ?  ?IMPRESSION: ?1. Improved spinal stenosis at L4-5 likely due to interval ?laminotomy ?2. Interval pedicle screw and interbody fusion L5-S1. Negative for ?stenosis. ?  ?  ?Electronically Signed ?  By: CFranchot GalloM.D. ?  On: 10/11/2021 14:03 ?  ? ? ? ?Subjective: ?Chief Complaint  ?Patient presents with  ? Lower Back - Follow-up  ? ? ?76year old female post L5-S1 fusion  with adjacent level L4-5 decompression. She is experiencing pain into the left hip and buttock and left leg in the left lateral thigh and calf. ? ? ?Review of Systems  ?Constitutional: Negative.   ?HENT: Negative.    ?Eyes: Negative.   ?Respiratory: Negative.    ?Cardiovascular: Negative.   ?Gastrointestinal: Negative.   ?Endocrine: Negative.   ?Genitourinary: Negative.   ?Musculoskeletal: Negative.   ?Skin: Negative.   ?Allergic/Immunologic: Negative.   ?Neurological: Negative.   ?Hematological: Negative.   ?Psychiatric/Behavioral: Negative.    ? ? ?Objective: ?Vital Signs: BP (!) 180/77   Pulse 81   Ht '5\' 3"'$  (1.6 m)   Wt 175 lb (79.4 kg)   BMI 31.00 kg/m?  ? ?Physical Exam ? ?Back Exam  ? ?  Tenderness  ?The patient is experiencing tenderness in the lumbar. ? ?Range of Motion  ?Extension:  abnormal  ?Flexion:  abnormal  ?Lateral bend right:  abnormal  ?Lateral bend left:  abnormal  ?Rotation right:  abnormal  ?Rotation left:  abnormal  ? ?Muscle Strength  ?Right Quadriceps:  5/5  ?Left Quadriceps:  5/5  ?Right Hamstrings:  5/5  ?Left Hamstrings:  5/5  ? ?Reflexes  ?Patellar:  0/4 ?Achilles:  0/4 ? ?Comments:  EHL left is normal ? ? ? ? ?Specialty Comments:  ?No specialty comments available. ? ?Imaging: ?No results  found. ? ? ?PMFS History: ?Patient Active Problem List  ? Diagnosis Date Noted  ? Urge incontinence 07/26/2021  ? Urinary retention 04/15/2021  ? Acute kidney injury ([place]) 04/12/2021  ? Acute renal failure superimposed on chronic kidney disease, unspecified CKD stage, unspecified acute renal failure type ([place]) 04/12/2021  ? Acute renal failure superimposed on stage 3b chronic kidney disease ([place]) 04/12/2021  ? Dehydration   ? Spondylolisthesis, lumbar region   ? Spinal stenosis, lumbar region, with neurogenic claudication   ? Dysphagia 03/18/2021  ? Speech disturbance 03/18/2021  ? Wheezing 03/18/2021  ? Ileus ([place]) 03/18/2021  ? Fusion of spine of lumbar region 03/15/2021  ? Cerebrovascular disease 12/20/2020  ? GERD (gastroesophageal reflux disease) 11/18/2020  ? Type 2 diabetes mellitus with stage 3b chronic kidney disease, without long-term current use of insulin ([place]) 07/12/2020  ? Mixed hyperlipidemia 07/12/2020  ? Essential hypertension, benign 07/12/2020  ? Iron deficiency anemia   ? Loss of weight 04/09/2020  ? Dyspepsia 01/28/2020  ? Constipation 01/28/2020  ? Lower abdominal pain 01/28/2020  ? Normocytic anemia 01/28/2020  ? ?Past Medical History:  ?Diagnosis Date  ? Acid reflux disease   ? Anemia   ? Anxiety   ? Asthma   ? Breast cancer ([place])   ? left side  ? Chronic back pain   ? Chronic kidney disease   ? Diabetes mellitus without complication ([place])   ? Diverticulitis   ? Hypertension   ? Lumbosacral spondylosis   ? Per previous PCP records  ? Sleep apnea   ? Thyroid disease   ?  ?Family History  ?Problem Relation Age of Onset  ? Diabetes Mother   ? Thyroid disease Mother   ? Colon cancer Neg Hx   ?  ?Past Surgical History:  ?Procedure Laterality Date  ? ABDOMINAL HYSTERECTOMY    ? BACK SURGERY    ? BIOPSY  02/19/2020  ? Procedure: BIOPSY;  Surgeon: Eloise Harman, DO;  [facility];  Service: Endoscopy;;  ? BREAST LUMPECTOMY Left 2003  ? BREAST SURGERY    ? COLONOSCOPY WITH PROPOFOL N/A  02/19/2020  ? non-bleeding internal hemorrhoids, many small-mouthed diverticula in entire colon.  ? ESOPHAGOGASTRODUODENOSCOPY (EGD) WITH PROPOFOL N/A 02/19/2020  ?  benign-appearing esophageal stenosis s/p dilation, gastritis s/p biopsy, normal duodenum. Negative H.pylori, negative duodenal biopsy.   ? GIVENS CAPSULE STUDY N/A 06/14/2020  ? Procedure: GIVENS CAPSULE STUDY;  Surgeon: Eloise Harman, DO;  Location: [facility];  Service: Endoscopy;  Laterality: N/A;  7:30am  ? THYROIDECTOMY    ? tummy tuck    ? at time of breast surgery 2003  ? ?Social History  ? ?Occupational History  ? Occupation: missionary  ?  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 Smoking status: Never  ? Smokeless tobacco: Never  ?Vaping Use  ? Vaping Use: Never used  ?  Substance and Sexual Activity  ? Alcohol use: Not Currently  ? Drug use: Not Currently  ? Sexual activity: Not on file  ? ? ? ? ? ? ?

## 2021-10-14 NOTE — Addendum Note (Signed)
Addended by: Basil Dess on: 10/14/2021 12:51 PM ? ? Modules accepted: Orders ? ?

## 2021-10-14 NOTE — Patient Instructions (Signed)
Plan: Avoid bending, stooping and avoid lifting weights greater than 10 lbs. Avoid prolong standing and walking. Avoid frequent bending and stooping  No lifting greater than 10 lbs. May use ice or moist heat for pain. Weight loss is of benefit. Handicap license is approved. Dr. Newton's secretary/Assistant will call to arrange for epidural steroid injection  

## 2021-10-18 ENCOUNTER — Ambulatory Visit (INDEPENDENT_AMBULATORY_CARE_PROVIDER_SITE_OTHER): Payer: Medicare Other | Admitting: Podiatry

## 2021-10-18 ENCOUNTER — Encounter: Payer: Self-pay | Admitting: Podiatry

## 2021-10-18 DIAGNOSIS — B351 Tinea unguium: Secondary | ICD-10-CM | POA: Diagnosis not present

## 2021-10-18 DIAGNOSIS — M79676 Pain in unspecified toe(s): Secondary | ICD-10-CM | POA: Diagnosis not present

## 2021-10-18 DIAGNOSIS — E1142 Type 2 diabetes mellitus with diabetic polyneuropathy: Secondary | ICD-10-CM | POA: Diagnosis not present

## 2021-10-18 NOTE — Progress Notes (Signed)
She presents today chief complaint of numbness and tingling to toes pain to her back and legs and elongated nails. ? ?Objective: Vital signs are stable alert oriented x3 pulses remain strong palpable bilateral.  Skin has neuro normal texture and turgor no xerosis.  Nails are long thick dystrophic.  Diminished sensation per Thornell Mule monofilament. ? 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 ? ?Plan: Debridement of nails bilateral. ?

## 2021-10-31 ENCOUNTER — Other Ambulatory Visit: Payer: Self-pay

## 2021-10-31 MED ORDER — TRULICITY 3 MG/0.5ML ~~LOC~~ SOAJ: SUBCUTANEOUS | 1 refills | Status: DC

## 2021-11-04 ENCOUNTER — Telehealth: Payer: Self-pay | Admitting: Specialist

## 2021-11-04 ENCOUNTER — Encounter: Payer: Self-pay | Admitting: Specialist

## 2021-11-04 ENCOUNTER — Ambulatory Visit (INDEPENDENT_AMBULATORY_CARE_PROVIDER_SITE_OTHER): Payer: Medicare Other | Admitting: Specialist

## 2021-11-04 VITALS — BP 131/82 | HR 78 | Ht 63.0 in | Wt 173.0 lb

## 2021-11-04 DIAGNOSIS — M4317 Spondylolisthesis, lumbosacral region: Secondary | ICD-10-CM | POA: Diagnosis not present

## 2021-11-04 DIAGNOSIS — M48062 Spinal stenosis, lumbar region with neurogenic claudication: Secondary | ICD-10-CM | POA: Diagnosis not present

## 2021-11-04 DIAGNOSIS — M5416 Radiculopathy, lumbar region: Secondary | ICD-10-CM | POA: Diagnosis not present

## 2021-11-04 DIAGNOSIS — M7062 Trochanteric bursitis, left hip: Secondary | ICD-10-CM

## 2021-11-04 DIAGNOSIS — M5116 Intervertebral disc disorders with radiculopathy, lumbar region: Secondary | ICD-10-CM

## 2021-11-04 DIAGNOSIS — Z981 Arthrodesis status: Secondary | ICD-10-CM

## 2021-11-04 MED ORDER — BUPIVACAINE HCL 0.5 % IJ SOLN
3.0000 mL | INTRAMUSCULAR | Status: AC | PRN
  Administered 2021-11-04: 3 mL via INTRA_ARTICULAR

## 2021-11-04 MED ORDER — METHYLPREDNISOLONE ACETATE 40 MG/ML IJ SUSP
40.0000 mg | INTRAMUSCULAR | Status: AC | PRN
  Administered 2021-11-04: 40 mg via INTRA_ARTICULAR

## 2021-11-04 NOTE — Patient Instructions (Signed)
Avoid bending, stooping and avoid lifting weights greater than 10 lbs. ?Avoid prolong standing and walking. ?Order for a new walker with wheels. ?Surgery scheduling secretary Kandice Hams, will call you in the next week to schedule for surgery.  ?Surgery recommended is a one level lumbar fusion L4-5 extension to the level above the previous fusion L5-S1 this would be done with exchange of rods to longer rods, placement of 2 screws and cage at the next level superiorly with local bone graft and allograft (donor bone graft). ?Take hydrocodone for for pain. ?Risk of surgery includes risk of infection 1 in 200 patients, bleeding 1/2% chance you would need a transfusion.   Risk to the nerves is one in 10,000. ?You will need to use a brace for 3 months and wean from the brace on the 4th month. ?Expect improved walking and standing tolerance. Expect relief of leg pain but numbness ?may persist depending on the length and degree of pressure that has been present. ?  ?

## 2021-11-04 NOTE — Progress Notes (Addendum)
? ?Office Visit Note ?  ?Patient: Stephanie Moreno           ?Date of Birth: [birth year]           ?MRN: [id number] ?Visit Date: 11/04/2021 ?             ?Requested by: Margretta Sidle, MD ?[address] ?[place],  [place] [address] ?PCP: Margretta Sidle, MD ? ? ?Assessment & Plan: ?Visit Diagnoses:  ?1. Spinal stenosis, lumbar region, with neurogenic claudication   ?2. Spondylolisthesis at L5-S1 level   ?3. Radiculopathy, lumbar region   ?4. Lumbar disc herniation with radiculopathy   ?5. S/P lumbar fusion   ?76 year old female with increasing pain and impairment of her ability to stand and walk. She did well with ?Surgery but after a fall had increased pain and discomfort into the left buttock and left thigh. She is weak in the left L4 distribution. Her MRI suggests new HNP with subarticular stenosis at the superior adjacent level L4-5 above the Fusion site. She has an ESI ordered but it will be at least 3 weeks. I will cancel the ESI and schedule her for intervention in the form of a left L4-5 TLIF and extension of the fusion to that level.  Her pain is not being relieved even with oxycodone and I believe the ESI will only temporize the discomfort.  ? ?Plan: Avoid bending, stooping and avoid lifting weights greater than 10 lbs. ?Avoid prolong standing and walking. ?Order for a new walker with wheels. ?Surgery scheduling secretary Kandice Hams, will call you in the next week to schedule for surgery.  ?Surgery recommended is a one level lumbar fusion L4-5 extension to the level above the previous fusion L5-S1 this would be done with exchange of rods to longer rods, placement of 2 screws and cage at the next level superiorly with local bone graft and allograft (donor bone graft). ?Take hydrocodone for for pain. ?Risk of surgery includes risk of infection 1 in 200 patients, bleeding 1/2% chance you would need a transfusion.   Risk to the nerves is one in 10,000. ?You will need to use a brace for 3 months and wean  from the brace on the 4th month. ?Expect improved walking and standing tolerance. Expect relief of leg pain but numbness ?may persist depending on the length and degree of pressure that has been present. ?  ? ?Follow-Up Instructions: No follow-ups on file.  ? ?Orders:  ?No orders of the defined types were placed in this encounter. ? ?No orders of the defined types were placed in this encounter. ? ? ? ? Procedures: ?Large Joint Inj: L greater trochanter on 11/04/2021 12:28 PM ?Indications: pain ?Details: 22 G 3.5 in needle, superolateral approach ? ?Arthrogram: No ? ?Medications: 40 mg methylPREDNISolone acetate 40 MG/ML; 3 mL bupivacaine 0.5 % ?Outcome: tolerated well, no immediate complications ?Procedure, treatment alternatives, risks and benefits explained, specific risks discussed. Consent was given by the patient. Immediately prior to procedure a time out was called to verify the correct patient, procedure, equipment, support staff and site/side marked as required. Patient was prepped and draped in the usual sterile fashion.  ? ? ? ?Clinical Data: ?Findings:  ?Narrative & Impression ?CLINICAL DATA:  Low back pain. History of lumbar fusion six months ?ago ?? ?EXAM: ?MRI LUMBAR SPINE WITHOUT AND WITH CONTRAST ?? ?TECHNIQUE: ?Multiplanar and multiecho pulse sequences of the lumbar spine were ?obtained without and with intravenous contrast. ?? ?CONTRAST:  30m MULTIHANCE GADOBENATE DIMEGLUMINE 529 MG/ML  IV SOLN ?? ?COMPARISON:  Lumbar MRI 10/22/2020 ?? ?FINDINGS: ?Segmentation:  5 lumbar vertebra ?? ?Alignment:  Normal ?? ?Vertebrae: Normal bone marrow. Negative for fracture or mass. No ?evidence of spinal infection. ?? ?Conus medullaris and cauda equina: Conus extends to the L2-3 level. ?Conus and cauda equina appear normal. ?? ?Paraspinal and other soft tissues: Bilateral renal cysts. No ?paraspinous mass, adenopathy, or fluid collection ?? ?Disc levels: ?? ?L1-2: Negative ?? ?L2-3: Negative ?? ?L3-4: Mild facet  degeneration. Negative for disc protrusion or ?stenosis ?? ?L4-5: Shallow central disc protrusion unchanged. Bilateral facet ?hypertrophy. Mild spinal stenosis improved likely due to laminotomy. ?Mild subarticular stenosis bilaterally. ?? ?L5-S1: Pedicle screw and interbody fusion. Negative for spinal or ?foraminal stenosis. ?? ?IMPRESSION: ?1. Improved spinal stenosis at L4-5 likely due to interval ?laminotomy ?2. Interval pedicle screw and interbody fusion L5-S1. Negative for ?stenosis. ?? ?? ?Electronically Signed ?  By: Franchot Gallo M.D. ?  On: 10/11/2021 14:03 ?? ? ? ? ?Subjective: ?Chief Complaint  ?Patient presents with  ?? Lower Back - Pain  ? ? ? 76 year old female with ongoing left hip and lateral thigh pain. Seen last with increasing pain left thigh with associated weakness left leg. Previous fusion L5-S1 for spondylolisthesis with good relief of pain. She apparently fell and had increased pain since then. The results of the MRI done post fall shows a new disc herniation L4-5 with bilateral subarticular stenosis at this level. I had recommended ESI but she is having persistent pain that is severe and she is stooped. ESI are to be done in 3 weeks and she is requiring ongoing narcotics to relieve the pain now more than 6 weeks. No bowel or bladder difficulty.  ? ? ?Review of Systems  ?Constitutional: Negative.   ?HENT: Negative.    ?Eyes: Negative.   ?Respiratory: Negative.    ?Cardiovascular: Negative.   ?Gastrointestinal: Negative.   ?Endocrine: Negative.   ?Genitourinary: Negative.   ?Musculoskeletal: Negative.   ?Skin: Negative.   ?Allergic/Immunologic: Negative.   ?Neurological: Negative.   ?Hematological: Negative.   ?Psychiatric/Behavioral: Negative.    ? ? ?Objective: ?Vital Signs: BP 131/82   Pulse 78   Ht '5\' 3"'$  (1.6 m)   Wt 173 lb (78.5 kg)   BMI 30.65 kg/m?  ? ?Physical Exam ?Constitutional:   ?   Appearance: She is well-developed.  ?HENT:  ?   Head: Normocephalic and atraumatic.  ?Eyes:  ?    Pupils: Pupils are equal, round, and reactive to light.  ?Pulmonary:  ?   Effort: Pulmonary effort is normal.  ?   Breath sounds: Normal breath sounds.  ?Abdominal:  ?   General: Bowel sounds are normal.  ?   Palpations: Abdomen is soft.  ?Musculoskeletal:  ?   Cervical back: Normal range of motion and neck supple.  ?   Lumbar back: Positive left straight leg raise test. Negative right straight leg raise test.  ?Skin: ?   General: Skin is warm and dry.  ?Neurological:  ?   Mental Status: She is alert and oriented to person, place, and time.  ?Psychiatric:     ?   Behavior: Behavior normal.     ?   Thought Content: Thought content normal.     ?   Judgment: Judgment normal.  ? ?Back Exam  ? ?Tenderness  ?The patient is experiencing tenderness in the lumbar. ? ?Range of Motion  ?Extension:  abnormal  ?Flexion:  abnormal  ?Lateral bend right:  abnormal  ?  Lateral bend left:  abnormal  ?Rotation right:  abnormal  ?Rotation left:  abnormal  ? ?Muscle Strength  ?Right Quadriceps:  5/5  ?Left Quadriceps:  4/5  ?Right Hamstrings:  5/5  ?Left Hamstrings:  5/5  ? ?Tests  ?Straight leg raise right: negative ?Straight leg raise left: positive ? ?Reflexes  ?Patellar:  0/4 ?Achilles:  2/4 ? ?Comments:  RSLR is positive left  ? ? ? ?Specialty Comments:  ?No specialty comments available. ? ?Imaging: ?No results found. ? ? ?PMFS History: ?Patient Active Problem List  ? Diagnosis Date Noted  ?? Urge incontinence 07/26/2021  ?? Urinary retention 04/15/2021  ?? Acute kidney injury ([place]) 04/12/2021  ?? Acute renal failure superimposed on chronic kidney disease, unspecified CKD stage, unspecified acute renal failure type ([place]) 04/12/2021  ?? Acute renal failure superimposed on stage 3b chronic kidney disease ([place]) 04/12/2021  ?? Dehydration   ?? Spondylolisthesis, lumbar region   ?? Spinal stenosis, lumbar region, with neurogenic claudication   ?? Dysphagia 03/18/2021  ?? Speech disturbance 03/18/2021  ?? Wheezing 03/18/2021  ?? Ileus  ([place]) 03/18/2021  ?? Fusion of spine of lumbar region 03/15/2021  ?? Cerebrovascular disease 12/20/2020  ?? GERD (gastroesophageal reflux disease) 11/18/2020  ?? Type 2 diabetes mellitus with stage 3b chronic k

## 2021-11-04 NOTE — Progress Notes (Signed)
Low back pain for 1 month or longer- scheduled for injection with [facility] on 05/27, taking hydrocodone for pain but only last for a few hours. Hurts worse when has sit for long time then gets up. Radiates to left hip.  ?

## 2021-11-07 NOTE — Telephone Encounter (Signed)
Printed for Dr. Louanne Skye ? ?

## 2021-11-07 NOTE — Telephone Encounter (Signed)
Patient daughter would like to know what are other options outside of surgery for her mother and if there are any can you call her and advise? Post op period was very hard to deal with and if they dont have to go through that again it would be nice. ?

## 2021-11-10 NOTE — Telephone Encounter (Signed)
Patient is scheduled for an injection with Dr. Ernestina Patches on 11/21/21---please advise ?

## 2021-11-11 ENCOUNTER — Other Ambulatory Visit: Payer: Self-pay | Admitting: Specialist

## 2021-11-11 DIAGNOSIS — M4726 Other spondylosis with radiculopathy, lumbar region: Secondary | ICD-10-CM

## 2021-11-11 NOTE — Progress Notes (Signed)
Called and spoke with daughter concerning this patient's ongoing lumbago post op L5-S1 fusion. She has a small disc protrusion at L4-5 and mild subarticular  ?Spinal stenosis. As she is complaining of pain even while taking oxycodone I felt that surgery was indicated however daughter is concerned that she  ?May have a similar post op rehabilitaion concern that lead to a return to the ER and admission for dehydration. She wishes for [place] to try an ESI first if possible. ?I explained to her my concerns about her mother needing medications as strong or stronger than oxycodone to relieve her pain. I think that if the pain is that bad ?Injections may not help. But I will go ahead and see if ESI may be of benefit, she has not had any since her surgery in 04/2021. Will request  left L4-5 transforamenal  ?ESI.  ?

## 2021-11-14 ENCOUNTER — Ambulatory Visit
Admission: EM | Admit: 2021-11-14 | Discharge: 2021-11-14 | Disposition: A | Discharge status: Home / Self Care | Payer: Medicare Other | Attending: Internal Medicine | Admitting: Internal Medicine

## 2021-11-14 DIAGNOSIS — R3915 Urgency of urination: Secondary | ICD-10-CM

## 2021-11-14 DIAGNOSIS — M545 Low back pain, unspecified: Secondary | ICD-10-CM

## 2021-11-14 LAB — POCT URINALYSIS DIP (MANUAL ENTRY)
Bilirubin, UA: NEGATIVE
Blood, UA: NEGATIVE
Glucose, UA: NEGATIVE mg/dL
Ketones, POC UA: NEGATIVE mg/dL
Leukocytes, UA: NEGATIVE
Nitrite, UA: NEGATIVE
Protein Ur, POC: NEGATIVE mg/dL
Spec Grav, UA: 1.01 (ref 1.010–1.025)
Urobilinogen, UA: 0.2 E.U./dL
pH, UA: 7 (ref 5.0–8.0)

## 2021-11-14 NOTE — ED Triage Notes (Signed)
Pt presents with c/o urinary incontinence and frequency that began Saturday  ?

## 2021-11-14 NOTE — Discharge Instructions (Addendum)
Follow-up as soon as possible with your primary care provider.  Go to the emergency department if your symptoms significantly worsen at any time. ?

## 2021-11-14 NOTE — ED Provider Notes (Signed)
?[facility] ? ? ? ?CSN: [id number] ?Arrival date & time: 11/14/21  1516 ? ? ?  ? ?History   ?Chief Complaint ?Chief Complaint  ?Patient presents with  ? Urinary Incontinence  ? Urinary Frequency  ? ? ?HPI ?Stephanie Moreno is a 76 y.o. female.  ? ?Presenting today with 2-day history of urinary frequency, urgency, worsening incontinence from baseline.  She is also having some mild low back aching on the left.  Denies fever, chills, vomiting, diarrhea, vaginal symptoms, hematuria.  When asked, she does endorse some issues with constipation over the last few days for which she is taking a prescription laxative, this is not necessarily out of the ordinary for her to have an issue with constipation.  Did have some relief with this.  Has a history of urinary continence at baseline for which she takes Myrbetriq.  She states this has been less effective lately than it had been when she initially started taking it. ? ? ?Past Medical History:  ?Diagnosis Date  ? Acid reflux disease   ? Anemia   ? Anxiety   ? Asthma   ? Breast cancer ([place])   ? left side  ? Chronic back pain   ? Chronic kidney disease   ? Diabetes mellitus without complication ([place])   ? Diverticulitis   ? Hypertension   ? Lumbosacral spondylosis   ? Per previous PCP records  ? Sleep apnea   ? Thyroid disease   ? ? ?Patient Active Problem List  ? Diagnosis Date Noted  ? Urge incontinence 07/26/2021  ? Urinary retention 04/15/2021  ? Acute kidney injury ([place]) 04/12/2021  ? Acute renal failure superimposed on chronic kidney disease, unspecified CKD stage, unspecified acute renal failure type ([place]) 04/12/2021  ? Acute renal failure superimposed on stage 3b chronic kidney disease ([place]) 04/12/2021  ? Dehydration   ? Spondylolisthesis, lumbar region   ? Spinal stenosis, lumbar region, with neurogenic claudication   ? Dysphagia 03/18/2021  ? Speech disturbance 03/18/2021  ? Wheezing 03/18/2021  ? Ileus ([place]) 03/18/2021  ? Fusion of spine of lumbar region  03/15/2021  ? Cerebrovascular disease 12/20/2020  ? GERD (gastroesophageal reflux disease) 11/18/2020  ? Type 2 diabetes mellitus with stage 3b chronic kidney disease, without long-term current use of insulin ([place]) 07/12/2020  ? Mixed hyperlipidemia 07/12/2020  ? Essential hypertension, benign 07/12/2020  ? Iron deficiency anemia   ? Loss of weight 04/09/2020  ? Dyspepsia 01/28/2020  ? Constipation 01/28/2020  ? Lower abdominal pain 01/28/2020  ? Normocytic anemia 01/28/2020  ? ? ?Past Surgical History:  ?Procedure Laterality Date  ? ABDOMINAL HYSTERECTOMY    ? BACK SURGERY    ? BIOPSY  02/19/2020  ? Procedure: BIOPSY;  Surgeon: Eloise Harman, DO;  [facility];  Service: Endoscopy;;  ? BREAST LUMPECTOMY Left 2003  ? BREAST SURGERY    ? COLONOSCOPY WITH PROPOFOL N/A 02/19/2020  ? non-bleeding internal hemorrhoids, many small-mouthed diverticula in entire colon.  ? ESOPHAGOGASTRODUODENOSCOPY (EGD) WITH PROPOFOL N/A 02/19/2020  ?  benign-appearing esophageal stenosis s/p dilation, gastritis s/p biopsy, normal duodenum. Negative H.pylori, negative duodenal biopsy.   ? GIVENS CAPSULE STUDY N/A 06/14/2020  ? Procedure: GIVENS CAPSULE STUDY;  Surgeon: Eloise Harman, DO;  Location: [facility];  Service: Endoscopy;  Laterality: N/A;  7:30am  ? THYROIDECTOMY    ? tummy tuck    ? at time of breast surgery 2003  ? ? ?OB History   ?No obstetric history  on file. ?  ? ? ? ?Home Medications   ? ?Prior to Admission medications   ?Medication Sig Start Date End Date Taking? Authorizing Provider  ?acetaminophen (TYLENOL) 325 MG tablet Take 650 mg by mouth every 4 (four) hours as needed.    [provider]  ?amitriptyline (ELAVIL) 25 MG tablet Take 25 mg by mouth at bedtime. 10/20/20   [provider]  ?Azelastine HCl 0.15 % SOLN Place 1 spray into both nostrils daily. 08/19/21   [provider]  ?calcium-vitamin D (OSCAL WITH D) 500-200 MG-UNIT TABS tablet Take 1 tablet by mouth 3 (three)  times daily.    [provider]  ?docusate sodium (COLACE) 100 MG capsule TAKE 1 CAPSULE BY MOUTH TWICE A DAY 02/10/21   Erenest Rasher, PA-C  ?fluticasone (FLONASE) 50 MCG/ACT nasal spray Place 2 sprays into both nostrils daily.    [provider]  ?Fluticasone Furoate (ARNUITY ELLIPTA) 200 MCG/ACT AEPB Inhale 1 puff into the lungs daily.    [provider]  ?furosemide (LASIX) 20 MG tablet Take 1 tablet (20 mg total) by mouth every other day. Takes 40 mg every other day. On alternate days take 80 mg 04/18/21   Irwin Brakeman L, MD  ?glipiZIDE (GLUCOTROL XL) 5 MG 24 hr tablet Take 1 tablet (5 mg total) by mouth daily with breakfast. 09/16/21   Brita Romp, NP  ?HYDROcodone-acetaminophen (NORCO) 10-325 MG tablet Take 0.5 tablets by mouth every 4 (four) hours as needed. 10/14/21   Jessy Oto, MD  ?insulin degludec (TRESIBA FLEXTOUCH) 100 UNIT/ML FlexTouch Pen Inject 20 Units into the skin at bedtime. 05/04/21   Brita Romp, NP  ?Rolan Lipa 145 MCG CAPS capsule Take 145 mcg by mouth every morning. 04/20/21   [provider]  ?losartan (COZAAR) 100 MG tablet Take 100 mg by mouth daily. 04/16/21   [provider]  ?losartan (COZAAR) 50 MG tablet Take 50 mg by mouth daily. 08/18/21   [provider]  ?lubiprostone (AMITIZA) 8 MCG capsule Take 8 mcg by mouth 2 (two) times daily with a meal.    [provider]  ?magnesium oxide (MAG-OX) 400 MG tablet Take 400 mg by mouth daily. 01/17/20   [provider]  ?meclizine (ANTIVERT) 25 MG tablet Take 25 mg by mouth 3 (three) times daily as needed for dizziness.    [provider]  ?methocarbamol (ROBAXIN) 500 MG tablet Take 1 tablet (500 mg total) by mouth every 6 (six) hours as needed for muscle spasms. 03/19/21   Jessy Oto, MD  ?mirabegron ER (MYRBETRIQ) 50 MG TB24 tablet Take 1 tablet (50 mg total) by mouth daily. 08/23/21   Stoneking, Reece Leader., MD  ?Multiple Vitamins-Minerals  (MULTIVITAMIN WITH MINERALS) tablet Take 1 tablet by mouth daily.    [provider]  ?ondansetron (ZOFRAN-ODT) 4 MG disintegrating tablet Take 4 mg by mouth every 8 (eight) hours as needed. 08/02/21   [provider]  ?pantoprazole (PROTONIX) 40 MG tablet Take 1 tablet (40 mg total) by mouth daily. 30 minutes before breakfast 11/18/20   Annitta Needs, NP  ?polyethylene glycol (MIRALAX / GLYCOLAX) 17 g packet Take 17 g by mouth daily as needed for mild constipation. ?Patient taking differently: Take 17 g by mouth daily. 04/15/21   Johnson, Clanford L, MD  ?pregabalin (LYRICA) 50 MG capsule Take 1 capsule (50 mg total) by mouth at bedtime. 09/19/21   Jessy Oto, MD  ?rosuvastatin (CRESTOR) 10 MG tablet  Take 10 mg by mouth at bedtime.     [provider]  ?sucralfate (CARAFATE) 1 g tablet Take 1 g by mouth 3 (three) times daily.    [provider]  ?traMADol (ULTRAM) 50 MG tablet Take 50 mg by mouth every 6 (six) hours as needed. 04/20/21   [provider]  ?TRULICITY 3 HG/9.9ME SOPN SMARTSIG:3 Milligram(s) SUB-Q Once a Week 10/31/21   Brita Romp, NP  ? ? ?Family History ?Family History  ?Problem Relation Age of Onset  ? Diabetes Mother   ? Thyroid disease Mother   ? Colon cancer Neg Hx   ? ? ?Social History ?Social History  ? ?Tobacco Use  ? Smoking status: Never  ? Smokeless tobacco: Never  ?Vaping Use  ? Vaping Use: Never used  ?Substance Use Topics  ? Alcohol use: Not Currently  ? Drug use: Not Currently  ? ? ? ?Allergies   ?Codeine, Carvedilol, Hydrochlorothiazide, Lisinopril, Oxybutynin, and Penicillins ? ?Review of Systems ?Review of Systems ?Per HPI ? ?Physical Exam ?Triage Vital Signs ?ED Triage Vitals  ?Enc Vitals Group  ?   BP 11/14/21 1619 (!) 185/98  ?   Pulse Rate 11/14/21 1619 88  ?   Resp 11/14/21 1619 14  ?   Temp 11/14/21 1619 97.9 ?F (36.6 ?C)  ?   Temp Source 11/14/21 1619 Oral  ?   SpO2 11/14/21 1619 97 %  ?   Weight --   ?   Height --   ?   Head  Circumference --   ?   Peak Flow --   ?   Pain Score 11/14/21 1621 4  ?   Pain Loc --   ?   Pain Edu? --   ?   Excl. in [place]? --   ? ?No data found. ? ?Updated Vital Signs ?BP (!) 185/98 (BP Location: [place]

## 2021-11-21 ENCOUNTER — Encounter: Payer: Self-pay | Admitting: Physical Medicine and Rehabilitation

## 2021-11-21 ENCOUNTER — Ambulatory Visit: Payer: Self-pay

## 2021-11-21 ENCOUNTER — Ambulatory Visit (INDEPENDENT_AMBULATORY_CARE_PROVIDER_SITE_OTHER): Payer: Medicare Other | Admitting: Physical Medicine and Rehabilitation

## 2021-11-21 ENCOUNTER — Ambulatory Visit: Payer: Medicare Other | Admitting: Urology

## 2021-11-21 VITALS — BP 173/94 | HR 94

## 2021-11-21 DIAGNOSIS — M5416 Radiculopathy, lumbar region: Secondary | ICD-10-CM | POA: Diagnosis not present

## 2021-11-21 MED ORDER — METHYLPREDNISOLONE ACETATE 80 MG/ML IJ SUSP
80.0000 mg | Freq: Once | INTRAMUSCULAR | Status: AC
  Administered 2021-11-21: 80 mg

## 2021-11-21 NOTE — Patient Instructions (Signed)

## 2021-11-21 NOTE — Progress Notes (Signed)
Pt state lower back pain that travels to her left hip. Pt state walking, standing and laying down makes the pain worse. Pt state she takes pain meds to help ease her pain.  Numeric Pain Rating Scale and Functional Assessment Average Pain 7   In the last MONTH (on 0-10 scale) has pain interfered with the following?  1. General activity like being  able to carry out your everyday physical activities such as walking, climbing stairs, carrying groceries, or moving a chair?  Rating(8)   +Driver, -BT, -Dye Allergies.

## 2021-11-22 ENCOUNTER — Telehealth: Payer: Self-pay | Admitting: Physical Medicine and Rehabilitation

## 2021-11-22 NOTE — Telephone Encounter (Signed)
Pt called with FYI. Sugar has leveled out and pt is feeling better. No call back needed

## 2021-11-25 ENCOUNTER — Ambulatory Visit (INDEPENDENT_AMBULATORY_CARE_PROVIDER_SITE_OTHER): Payer: Medicare Other | Admitting: Urology

## 2021-11-25 ENCOUNTER — Encounter: Payer: Self-pay | Admitting: Urology

## 2021-11-25 VITALS — BP 167/91 | HR 92 | Ht 63.0 in | Wt 175.0 lb

## 2021-11-25 DIAGNOSIS — N3941 Urge incontinence: Secondary | ICD-10-CM

## 2021-11-25 DIAGNOSIS — Z87440 Personal history of urinary (tract) infections: Secondary | ICD-10-CM | POA: Diagnosis not present

## 2021-11-25 DIAGNOSIS — R339 Retention of urine, unspecified: Secondary | ICD-10-CM

## 2021-11-25 DIAGNOSIS — Z87898 Personal history of other specified conditions: Secondary | ICD-10-CM

## 2021-11-25 LAB — MICROSCOPIC EXAMINATION
RBC, Urine: NONE SEEN /hpf (ref 0–2)
Renal Epithel, UA: NONE SEEN /hpf

## 2021-11-25 LAB — URINALYSIS, ROUTINE W REFLEX MICROSCOPIC
Bilirubin, UA: NEGATIVE
Glucose, UA: NEGATIVE
Ketones, UA: NEGATIVE
Nitrite, UA: NEGATIVE
RBC, UA: NEGATIVE
Specific Gravity, UA: 1.01 (ref 1.005–1.030)
Urobilinogen, Ur: 0.2 mg/dL (ref 0.2–1.0)
pH, UA: 6 (ref 5.0–7.5)

## 2021-11-25 LAB — BLADDER SCAN AMB NON-IMAGING: Scan Result: 0

## 2021-11-25 NOTE — Progress Notes (Signed)
post void residual =0mL 

## 2021-11-25 NOTE — Progress Notes (Signed)
Assessment: 1. Urge incontinence   2. History of urinary retention   3. History of UTI    Plan: Continue Myrbetriq 50 mg daily.  Return to office in 4 months  Chief Complaint:  Chief Complaint  Patient presents with   Urinary Incontinence    History of Present Illness:  Stephanie Moreno is a 76 y.o. year old female who is seen for further evaluation of urinary retention.  She was admitted to the hospital on 04/11/2021 for generalized weakness.  She underwent a lumbar laminectomy in September 2022.  After returning home she developed significant weakness.  She was admitted with severe dehydration and acute on chronic renal insufficiency.  Urinalysis showed positive nitrite, >50 RBCs, >50 WBCs, and many bacteria.  Urine culture grew multiple species.  She did have problems with urinary retention while in the hospital.  She required in and out catheterization and eventually Foley catheter was placed prior to discharge. Her Foley has been draining well.  No gross hematuria. No prior history of urinary retention.  She denies any problems voiding prior to her hospitalization. She does report some problems with constipation.  Her Foley was removed on 04/28/2021.   She began voiding spontaneously following catheter removal.  A urine culture from 05/05/2021 grew >100 K ESBL.  She was started on Cipro on 05/09/2021.   She reported some episodes of incontinence.  No fevers or chills.  No gross hematuria. Urinalysis from 07/07/2021 showed no WBCs, no RBCs, no bacteria.  Urine culture grew <10K mixed flora.  At her visit in 1/23, she was voiding spontaneously.  She reported urgency and urge incontinence, occurring both daytime and nighttime.  She was using 2-3 adult pull-ups per day.  She noted a slight discomfort with voiding.  No gross hematuria. She had been on Gemtesa for approximately 2 months. She was given a trial of Myrbetriq 50 mg daily in 1/23.  At her visit in 2/23, she had noted  improvement in her symptoms with Myrbetriq.  She reported decreased urgency and incontinence.  Her nocturia had improved as well.  No side effects from the medication.  No dysuria or gross hematuria. She was continued on Myrbetriq 50 mg daily. She was seen in urgent care approximately 2 weeks ago for increased frequency and incontinence.  Urinalysis was unremarkable.  She returns today for follow-up.  She continues on Myrbetriq 50 mg daily.  She has had some recent increase in her urinary symptoms.  This has been associated with some increased blood glucose levels as well as increased low back pain.  She received an epidural injection for the back pain earlier this week.  She feels like her urinary symptoms are fairly well controlled.  She does have occasional episodes of urinary incontinence associated with urgency.  This occurs about every other day.  She has nocturia x1.  No dysuria or gross hematuria.    Portions of the above documentation were copied from a prior visit for review purposes only.   Past Medical History:  Past Medical History:  Diagnosis Date   Acid reflux disease    Anemia    Anxiety    Asthma    Breast cancer ([place])    left side   Chronic back pain    Chronic kidney disease    Diabetes mellitus without complication ([place])    Diverticulitis    Hypertension    Lumbosacral spondylosis    Per previous PCP records   Sleep apnea    Thyroid  disease     Past Surgical History:  Past Surgical History:  Procedure Laterality Date   ABDOMINAL HYSTERECTOMY     BACK SURGERY     BIOPSY  02/19/2020   Procedure: BIOPSY;  Surgeon: Eloise Harman, DO;  [facility];  Service: Endoscopy;;   BREAST LUMPECTOMY Left 2003   BREAST SURGERY     COLONOSCOPY WITH PROPOFOL N/A 02/19/2020   non-bleeding internal hemorrhoids, many small-mouthed diverticula in entire colon.   ESOPHAGOGASTRODUODENOSCOPY (EGD) WITH PROPOFOL N/A 02/19/2020    benign-appearing esophageal  stenosis s/p dilation, gastritis s/p biopsy, normal duodenum. Negative H.pylori, negative duodenal biopsy.    GIVENS CAPSULE STUDY N/A 06/14/2020   Procedure: GIVENS CAPSULE STUDY;  Surgeon: Eloise Harman, DO;  [facility];  Service: Endoscopy;  Laterality: N/A;  7:30am   THYROIDECTOMY     tummy tuck     at time of breast surgery 2003    Allergies:  Allergies  Allergen Reactions   Codeine Nausea And Vomiting and Anaphylaxis   Carvedilol     Cold sweats, diarrhea, severe hot flashes   Hydrochlorothiazide Other (See Comments)    Other reaction(s): stomach upset   Lisinopril Nausea And Vomiting    Other reaction(s): cough   Oxybutynin Other (See Comments)   Penicillins Hives    Family History:  Family History  Problem Relation Age of Onset   Diabetes Mother    Thyroid disease Mother    Colon cancer Neg Hx     Social History:  Social History   Tobacco Use   Smoking status: Never   Smokeless tobacco: Never  Vaping Use   Vaping Use: Never used  Substance Use Topics   Alcohol use: Not Currently   Drug use: Not Currently   ROS: Constitutional:  Negative for fever, chills, weight loss CV: Negative for chest pain, previous MI, hypertension Respiratory:  Negative for shortness of breath, wheezing, sleep apnea, frequent cough GI:  Negative for nausea, vomiting, bloody stool, GERD  Physical exam: BP (!) 167/91   Pulse 92   Ht '5\' 3"'$  (1.6 m)   Wt 175 lb (79.4 kg)   BMI 31.00 kg/m  GENERAL APPEARANCE:  Well appearing, well developed, well nourished, NAD HEENT:  Atraumatic, normocephalic, oropharynx clear NECK:  Supple without lymphadenopathy or thyromegaly ABDOMEN:  Soft, non-tender, no masses EXTREMITIES:  Moves all extremities well, without clubbing, cyanosis, or edema NEUROLOGIC:  Alert and oriented x 3, using walker, CN II-XII grossly intact MENTAL STATUS:  appropriate BACK:  Non-tender to palpation, No CVAT SKIN:  Warm, dry, and  intact  Results: U/A: 6-10 WBC, few bacteria  PVR = 0 ml

## 2021-11-30 NOTE — Procedures (Signed)
Lumbosacral Transforaminal Epidural Steroid Injection - Sub-Pedicular Approach with Fluoroscopic Guidance  Patient: Stephanie Moreno      Date of Birth: [birth year] MRN: [id number] PCP: Margretta Sidle, MD      Visit Date: 11/21/2021   Universal Protocol:    Date/Time: 11/21/2021  Consent Given By: the patient  Position: PRONE  Additional Comments: Vital signs were monitored before and after the procedure. Patient was prepped and draped in the usual sterile fashion. The correct patient, procedure, and site was verified.   Injection Procedure Details:   Procedure diagnoses: Lumbar radiculopathy [M54.16]    Meds Administered:  Meds ordered this encounter  Medications   methylPREDNISolone acetate (DEPO-MEDROL) injection 80 mg    Laterality: Left  Location/Site: L4  Needle:5.0 in., 22 ga.  Short bevel or Quincke spinal needle  Needle Placement: Transforaminal  Findings:    -Comments: Excellent flow of contrast along the nerve, nerve root and into the epidural space.  Procedure Details: After squaring off the end-plates to get a true AP view, the C-arm was positioned so that an oblique view of the foramen as noted above was visualized. The target area is just inferior to the "nose of the scotty dog" or sub pedicular. The soft tissues overlying this structure were infiltrated with 2-3 ml. of 1% Lidocaine without Epinephrine.  The spinal needle was inserted toward the target using a "trajectory" view along the fluoroscope beam.  Under AP and lateral visualization, the needle was advanced so it did not puncture dura and was located close the 6 O'Clock position of the pedical in AP tracterory. Biplanar projections were used to confirm position. Aspiration was confirmed to be negative for CSF and/or blood. A 1-2 ml. volume of Isovue-250 was injected and flow of contrast was noted at each level. Radiographs were obtained for documentation purposes.   After attaining the desired flow of  contrast documented above, a 0.5 to 1.0 ml test dose of 0.25% Marcaine was injected into each respective transforaminal space.  The patient was observed for 90 seconds post injection.  After no sensory deficits were reported, and normal lower extremity motor function was noted,   the above injectate was administered so that equal amounts of the injectate were placed at each foramen (level) into the transforaminal epidural space.   Additional Comments:  The patient tolerated the procedure well Dressing: 2 x 2 sterile gauze and Band-Aid    Post-procedure details: Patient was observed during the procedure. Post-procedure instructions were reviewed.  Patient left the clinic in stable condition.

## 2021-11-30 NOTE — Progress Notes (Signed)
Stephanie Moreno - 76 y.o. female MRN [id number]  Date of birth: [birth year]  Office Visit Note: Visit Date: 11/21/2021 PCP: Margretta Sidle, MD Referred by: Margretta Sidle, MD  Subjective: Chief Complaint  Patient presents with   Lower Back - Pain   Left Hip - Pain   HPI:  Stephanie Moreno is a 76 y.o. female who comes in today at the request of Dr. Basil Dess for planned Left L4-5 Lumbar Transforaminal epidural steroid injection with fluoroscopic guidance.  The patient has failed conservative care including home exercise, medications, time and activity modification.  This injection will be diagnostic and hopefully therapeutic.  Please see requesting physician notes for further details and justification.  ROS Otherwise per HPI.  Assessment & Plan: Visit Diagnoses:    ICD-10-CM   1. Lumbar radiculopathy  M54.16 XR C-ARM NO REPORT    Epidural Steroid injection    methylPREDNISolone acetate (DEPO-MEDROL) injection 80 mg      Plan: No additional findings.   Meds & Orders:  Meds ordered this encounter  Medications   methylPREDNISolone acetate (DEPO-MEDROL) injection 80 mg    Orders Placed This Encounter  Procedures   XR C-ARM NO REPORT   Epidural Steroid injection    Follow-up: Return for visit to requesting provider as needed.   Procedures: No procedures performed  Lumbosacral Transforaminal Epidural Steroid Injection - Sub-Pedicular Approach with Fluoroscopic Guidance  Patient: Stephanie Moreno      Date of Birth: 05-Jan-1946 MRN: [id number] PCP: Margretta Sidle, MD      Visit Date: 11/21/2021   Universal Protocol:    Date/Time: 11/21/2021  Consent Given By: the patient  Position: PRONE  Additional Comments: Vital signs were monitored before and after the procedure. Patient was prepped and draped in the usual sterile fashion. The correct patient, procedure, and site was verified.   Injection Procedure Details:   Procedure diagnoses: Lumbar radiculopathy [M54.16]     Meds Administered:  Meds ordered this encounter  Medications   methylPREDNISolone acetate (DEPO-MEDROL) injection 80 mg    Laterality: Left  Location/Site: L4  Needle:5.0 in., 22 ga.  Short bevel or Quincke spinal needle  Needle Placement: Transforaminal  Findings:    -Comments: Excellent flow of contrast along the nerve, nerve root and into the epidural space.  Procedure Details: After squaring off the end-plates to get a true AP view, the C-arm was positioned so that an oblique view of the foramen as noted above was visualized. The target area is just inferior to the "nose of the scotty dog" or sub pedicular. The soft tissues overlying this structure were infiltrated with 2-3 ml. of 1% Lidocaine without Epinephrine.  The spinal needle was inserted toward the target using a "trajectory" view along the fluoroscope beam.  Under AP and lateral visualization, the needle was advanced so it did not puncture dura and was located close the 6 O'Clock position of the pedical in AP tracterory. Biplanar projections were used to confirm position. Aspiration was confirmed to be negative for CSF and/or blood. A 1-2 ml. volume of Isovue-250 was injected and flow of contrast was noted at each level. Radiographs were obtained for documentation purposes.   After attaining the desired flow of contrast documented above, a 0.5 to 1.0 ml test dose of 0.25% Marcaine was injected into each respective transforaminal space.  The patient was observed for 90 seconds post injection.  After no sensory deficits were reported, and normal lower extremity motor function was noted,   the  above injectate was administered so that equal amounts of the injectate were placed at each foramen (level) into the transforaminal epidural space.   Additional Comments:  The patient tolerated the procedure well Dressing: 2 x 2 sterile gauze and Band-Aid    Post-procedure details: Patient was observed during the  procedure. Post-procedure instructions were reviewed.  Patient left the clinic in stable condition.    Clinical History: MRI LUMBAR SPINE WITHOUT AND WITH CONTRAST   TECHNIQUE: Multiplanar and multiecho pulse sequences of the lumbar spine were obtained without and with intravenous contrast.   CONTRAST:  24m MULTIHANCE GADOBENATE DIMEGLUMINE 529 MG/ML IV SOLN   COMPARISON:  Lumbar MRI 10/22/2020   FINDINGS: Segmentation:  5 lumbar vertebra   Alignment:  Normal   Vertebrae: Normal bone marrow. Negative for fracture or mass. No evidence of spinal infection.   Conus medullaris and cauda equina: Conus extends to the L2-3 level. Conus and cauda equina appear normal.   Paraspinal and other soft tissues: Bilateral renal cysts. No paraspinous mass, adenopathy, or fluid collection   Disc levels:   L1-2: Negative   L2-3: Negative   L3-4: Mild facet degeneration. Negative for disc protrusion or stenosis   L4-5: Shallow central disc protrusion unchanged. Bilateral facet hypertrophy. Mild spinal stenosis improved likely due to laminotomy. Mild subarticular stenosis bilaterally.   L5-S1: Pedicle screw and interbody fusion. Negative for spinal or foraminal stenosis.   IMPRESSION: 1. 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 Interval pedicle screw and interbody fusion L5-S1. Negative for stenosis.     Electronically Signed   By: CFranchot GalloM.D.   On: 10/11/2021 14:03     Objective:  VS:  HT:    WT:   BMI:     BP:(!) 173/94  HR:94bpm  TEMP: ( )  RESP:  Physical Exam Vitals and nursing note reviewed.  Constitutional:      General: She is not in acute distress.    Appearance: Normal appearance. She is not ill-appearing.  HENT:     Head: Normocephalic and atraumatic.     Right Ear: External ear normal.     Left Ear: External ear normal.  Eyes:     Extraocular Movements: Extraocular movements intact.  Cardiovascular:     Rate  and Rhythm: Normal rate.     Pulses: Normal pulses.  Pulmonary:     Effort: Pulmonary effort is normal. No respiratory distress.  Abdominal:     General: There is no distension.     Palpations: Abdomen is soft.  Musculoskeletal:        General: Tenderness present.     Cervical back: Neck supple.     Right lower leg: No edema.     Left lower leg: No edema.     Comments: Patient has good distal strength with no pain over the greater trochanters.  No clonus or focal weakness.  Skin:    Findings: No erythema, lesion or rash.  Neurological:     General: No focal deficit present.     Mental Status: She is alert and oriented to person, place, and time.     Sensory: No sensory deficit.     Motor: No weakness or abnormal muscle tone.     Coordination: Coordination normal.  Psychiatric:        Mood and Affect: Mood normal.        Behavior: Behavior normal.     Imaging: No results found.

## 2021-12-07 ENCOUNTER — Encounter: Payer: Self-pay | Admitting: Specialist

## 2021-12-07 ENCOUNTER — Ambulatory Visit (INDEPENDENT_AMBULATORY_CARE_PROVIDER_SITE_OTHER): Payer: Medicare Other | Admitting: Specialist

## 2021-12-07 VITALS — BP 142/85 | HR 55 | Ht 63.0 in | Wt 173.0 lb

## 2021-12-07 DIAGNOSIS — G603 Idiopathic progressive neuropathy: Secondary | ICD-10-CM | POA: Diagnosis not present

## 2021-12-07 DIAGNOSIS — R29818 Other symptoms and signs involving the nervous system: Secondary | ICD-10-CM | POA: Diagnosis not present

## 2021-12-07 DIAGNOSIS — I739 Peripheral vascular disease, unspecified: Secondary | ICD-10-CM

## 2021-12-07 NOTE — Patient Instructions (Signed)
Avoid bending, stooping and avoid lifting weights greater than 10 lbs. Avoid prolong standing and walking. Avoid frequent bending and stooping  No lifting greater than 10 lbs. May use ice or moist heat for pain. Weight loss is of benefit. Handicap license is approved. Dr. Romona Curls secretary/Assistant will call to arrange for epidural steroid injection   RX for rolling walker with wheels.

## 2021-12-07 NOTE — Progress Notes (Signed)
Office Visit Note   Patient: Stephanie Moreno           Date of Birth: Jul 15, 1945           MRN: [id number] Visit Date: 12/07/2021              Requested by: Margretta Sidle, [address] [place],  [place] [address] PCP: Margretta Sidle, MD   Assessment & Plan: Visit Diagnoses:  1. Neurogenic claudication   2. Peripheral vascular disease with claudication ([place])   3. Idiopathic progressive neuropathy     Plan: Avoid bending, stooping and avoid lifting weights greater than 10 lbs. Avoid prolong standing and walking. Avoid frequent bending and stooping  No lifting greater than 10 lbs. May use ice or moist heat for pain. Weight loss is of benefit. Handicap license is approved. Dr. Romona Curls secretary/Assistant will call to arrange for epidural steroid injection   RX for rolling walker with wheels.  Follow-Up Instructions: Return in about 5 weeks (around 01/11/2022).   Orders:  Orders Placed This Encounter  Procedures   For home use only DME 4 wheeled rolling walker with seat   Ambulatory referral to Physical Medicine Rehab   VAS [place] PAD ABI   No orders of the defined types were placed in this encounter.     Procedures: No procedures performed   Clinical Data: No additional findings.   Subjective: Chief Complaint  Patient presents with   Lower Back - Follow-up    Had Left L4 tf-got 70% relief     76 year old female with history of bilateral lateral recess decompression L4-5 and TLIF L5-S1 for spondylolisthesis with increased pain and difficulty with leg pain Underwent Left TF ESI L4. She reports nearly 75 % pain relief. No bowel or bladder difficulty.     Review of Systems  Constitutional: Negative.   HENT: Negative.    Eyes: Negative.   Respiratory: Negative.    Cardiovascular: Negative.   Gastrointestinal: Negative.   Endocrine: Negative.   Genitourinary: Negative.   Musculoskeletal: Negative.   Skin: Negative.   Allergic/Immunologic: Negative.    Neurological: Negative.   Hematological: Negative.   Psychiatric/Behavioral: Negative.       Objective: Vital Signs: BP (!) 142/85 (BP Location: Left Arm, Patient Position: Sitting)   Pulse (!) 55   Ht '5\' 3"'$  (1.6 m)   Wt 173 lb (78.5 kg)   BMI 30.65 kg/m   Physical Exam Constitutional:      Appearance: She is well-developed.  HENT:     Head: Normocephalic and atraumatic.  Eyes:     Pupils: Pupils are equal, round, and reactive to light.  Pulmonary:     Effort: Pulmonary effort is normal.     Breath sounds: Normal breath sounds.  Abdominal:     General: Bowel sounds are normal.     Palpations: Abdomen is soft.  Musculoskeletal:     Cervical back: Normal range of motion and neck supple.     Lumbar back: Negative right straight leg raise test and negative left straight leg raise test.  Skin:    General: Skin is warm and dry.  Neurological:     Mental Status: She is alert and oriented to person, place, and time.  Psychiatric:        Behavior: Behavior normal.        Thought Content: Thought content normal.        Judgment: Judgment normal.    Back Exam   Tenderness  The patient is experiencing tenderness in the lumbar.  Range of Motion  Extension:  abnormal  Flexion:  abnormal  Lateral bend right:  abnormal  Lateral bend left:  abnormal  Rotation right:  abnormal  Rotation left:  abnormal   Muscle Strength  Right Quadriceps:  5/5  Left Quadriceps:  5/5  Right Hamstrings:  5/5  Left Hamstrings:  5/5   Tests  Straight leg raise right: negative Straight leg raise left: negative  Reflexes  Patellar:  0/4 Achilles:  0/4  Other  Toe walk: normal Heel walk: normal  Comments:  9 months post op with recurring claudication symptoms. Due to diabetes she should have ABIs assessed to rule out a vascular cause of claudication.      Specialty Comments:  MRI LUMBAR SPINE WITHOUT AND WITH CONTRAST   TECHNIQUE: Multiplanar and multiecho pulse sequences of  the lumbar spine were obtained without and with intravenous contrast.   CONTRAST:  61m MULTIHANCE GADOBENATE DIMEGLUMINE 529 MG/ML IV SOLN   COMPARISON:  Lumbar MRI 10/22/2020   FINDINGS: Segmentation:  5 lumbar vertebra   Alignment:  Normal   Vertebrae: Normal bone marrow. Negative for fracture or mass. No evidence of spinal infection.   Conus medullaris and cauda equina: Conus extends to the L2-3 level. Conus and cauda equina appear normal.   Paraspinal and other soft tissues: Bilateral renal cysts. No paraspinous mass, adenopathy, or fluid collection   Disc levels:   L1-2: Negative   L2-3: Negative   L3-4: Mild facet degeneration. Negative for disc protrusion or stenosis   L4-5: Shallow central disc protrusion unchanged. Bilateral facet hypertrophy. Mild spinal stenosis improved likely due to laminotomy. Mild subarticular stenosis bilaterally.   L5-S1: Pedicle screw and interbody fusion. Negative for spinal or foraminal stenosis.   IMPRESSION: 1. 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 Interval pedicle screw and interbody fusion L5-S1. Negative for stenosis.     Electronically Signed   By: CFranchot GalloM.D.   On: 10/11/2021 14:03  Imaging: No results found.   PMFS History: Patient Active Problem List   Diagnosis Date Noted   Urge incontinence 07/26/2021   Urinary retention 04/15/2021   Acute kidney injury ([place] 04/12/2021   Acute renal failure superimposed on chronic kidney disease, unspecified CKD stage, unspecified acute renal failure type ([facility] 04/12/2021   Acute renal failure superimposed on stage 3b chronic kidney disease ([place] 04/12/2021   Dehydration    Spondylolisthesis, lumbar region    Spinal stenosis, lumbar region, with neurogenic claudication    Dysphagia 03/18/2021   Speech disturbance 03/18/2021   Wheezing 03/18/2021   Ileus ([place] [place] 03/18/2021   Fusion of spine of lumbar region 03/15/2021    Cerebrovascular disease 12/20/2020   GERD (gastroesophageal reflux disease) 11/18/2020   Type 2 diabetes mellitus with stage 3b chronic kidney disease, without long-term current use of insulin ([facility] 07/12/2020   Mixed hyperlipidemia 07/12/2020   Essential hypertension, benign 07/12/2020   Iron deficiency anemia    Loss of weight 04/09/2020   Dyspepsia 01/28/2020   Constipation 01/28/2020   Lower abdominal pain 01/28/2020   Normocytic anemia 01/28/2020   Past Medical History:  Diagnosis Date   Acid reflux disease    Anemia    Anxiety    Asthma    Breast cancer ([place]    left side   Chronic back pain    Chronic kidney disease    Depression    Diabetes mellitus without complication ([facility]  Diverticulitis    Hypertension    Lumbosacral spondylosis    Per previous PCP records   Sleep apnea    Thyroid disease     Family History  Problem Relation Age of Onset   Diabetes Mother    Thyroid disease Mother    Colon cancer Neg Hx     Past Surgical History:  Procedure Laterality Date   ABDOMINAL HYSTERECTOMY     BACK SURGERY     BIOPSY  02/19/2020   Procedure: BIOPSY;  Surgeon: Eloise Harman, DO;  [facility];  Service: Endoscopy;;   BREAST LUMPECTOMY Left 2003   BREAST SURGERY     COLONOSCOPY WITH PROPOFOL N/A 02/19/2020   non-bleeding internal hemorrhoids, many small-mouthed diverticula in entire colon.   ESOPHAGOGASTRODUODENOSCOPY (EGD) WITH PROPOFOL N/A 02/19/2020    benign-appearing esophageal stenosis s/p dilation, gastritis s/p biopsy, normal duodenum. Negative H.pylori, negative duodenal biopsy.    GIVENS CAPSULE STUDY N/A 06/14/2020   Procedure: GIVENS CAPSULE STUDY;  Surgeon: Eloise Harman, DO;  [facility];  Service: Endoscopy;  Laterality: N/A;  7:30am   THYROIDECTOMY     tummy tuck     at time of breast surgery 2003   Social History   Occupational History   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 [place]   Tobacco Use   Smoking status: Never   Smokeless tobacco: Never  Vaping Use   Vaping Use: Never used  Substance and Sexual Activity   Alcohol use: Not Currently   Drug use: Not Currently   Sexual activity: Not on file

## 2021-12-07 NOTE — Progress Notes (Signed)
Office Visit Note   Patient: Stephanie Moreno           Date of Birth: 19-Jun-1946           MRN: [id number] Visit Date: 12/07/2021              Requested by: Margretta Sidle, [address] [place],  [place] [address] PCP: Margretta Sidle, MD   Assessment & Plan: Visit Diagnoses:  1. Neurogenic claudication   2. Peripheral vascular disease with claudication ([place])   3. Idiopathic progressive neuropathy     Plan: Plan: Avoid bending, stooping and avoid lifting weights greater than 10 lbs. Avoid prolong standing and walking. Avoid frequent bending and stooping  No lifting greater than 10 lbs. May use ice or moist heat for pain. Weight loss is of benefit. Handicap license is approved. Dr. Romona Curls secretary/Assistant will call to arrange for epidural steroid injection   RX for rolling walker with wheels.  Follow-Up Instructions: Return in about 5 weeks (around 01/11/2022).   Orders:  Orders Placed This Encounter  Procedures   For home use only DME 4 wheeled rolling walker with seat   Ambulatory referral to Physical Medicine Rehab   VAS [place] PAD ABI   No orders of the defined types were placed in this encounter.     Procedures: No procedures performed   Clinical Data: No additional findings.   Subjective: Chief Complaint  Patient presents with   Lower Back - Follow-up    Had Left L4 tf-got 70% relief     76 year old female with history of lumbar laminectomy L4-5 above previous L5-S1 fusion 9/22. She is experiencing left leg pain and numbness and underwent  left L4 ESI with about 70% relief of pain. No bowel or bladder difficulty. Requests an RX for a walker with wheels and a seat for assisting in her walking. Does not want more surgery but assistance with walker and further ESI as needed.    Review of Systems  Constitutional: Negative.   HENT: Negative.    Eyes: Negative.   Respiratory: Negative.    Cardiovascular: Negative.   Gastrointestinal:  Negative.   Endocrine: Negative.   Genitourinary: Negative.   Musculoskeletal: Negative.   Skin: Negative.   Allergic/Immunologic: Negative.   Neurological: Negative.   Hematological: Negative.   Psychiatric/Behavioral: Negative.       Objective: Vital Signs: BP (!) 142/85 (BP Location: Left Arm, Patient Position: Sitting)   Pulse (!) 55   Ht '5\' 3"'$  (1.6 m)   Wt 173 lb (78.5 kg)   BMI 30.65 kg/m   Physical Exam Constitutional:      Appearance: She is well-developed.  HENT:     Head: Normocephalic and atraumatic.  Eyes:     Pupils: Pupils are equal, round, and reactive to light.  Pulmonary:     Effort: Pulmonary effort is normal.     Breath sounds: Normal breath sounds.  Abdominal:     General: Bowel sounds are normal.     Palpations: Abdomen is soft.  Musculoskeletal:     Cervical back: Normal range of motion and neck supple.     Lumbar back: Negative right straight leg raise test and negative left straight leg raise test.  Skin:    General: Skin is warm and dry.  Neurological:     Mental Status: She is alert and oriented to person, place, and time.  Psychiatric:        Behavior: Behavior normal.  Thought Content: Thought content normal.        Judgment: Judgment normal.    Back Exam   Tenderness  The patient is experiencing tenderness in the lumbar.  Range of Motion  Extension:  abnormal  Flexion:  abnormal  Lateral bend right:  abnormal  Lateral bend left:  abnormal  Rotation right:  abnormal  Rotation left:  abnormal   Tests  Straight leg raise right: negative Straight leg raise left: negative  Other  Toe walk: normal Heel walk: normal     Specialty Comments:  MRI LUMBAR SPINE WITHOUT AND WITH CONTRAST   TECHNIQUE: Multiplanar and multiecho pulse sequences of the lumbar spine were obtained without and with intravenous contrast.   CONTRAST:  11m MULTIHANCE GADOBENATE DIMEGLUMINE 529 MG/ML IV SOLN   COMPARISON:  Lumbar MRI  10/22/2020   FINDINGS: Segmentation:  5 lumbar vertebra   Alignment:  Normal   Vertebrae: Normal bone marrow. Negative for fracture or mass. No evidence of spinal infection.   Conus medullaris and cauda equina: Conus extends to the L2-3 level. Conus and cauda equina appear normal.   Paraspinal and other soft tissues: Bilateral renal cysts. No paraspinous mass, adenopathy, or fluid collection   Disc levels:   L1-2: Negative   L2-3: Negative   L3-4: Mild facet degeneration. Negative for disc protrusion or stenosis   L4-5: Shallow central disc protrusion unchanged. Bilateral facet hypertrophy. Mild spinal stenosis improved likely due to laminotomy. Mild subarticular stenosis bilaterally.   L5-S1: Pedicle screw and interbody fusion. Negative for spinal or foraminal stenosis.   IMPRESSION: 1. 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 Interval pedicle screw and interbody fusion L5-S1. Negative for stenosis.     Electronically Signed   By: CFranchot GalloM.D.   On: 10/11/2021 14:03  Imaging: No results found.   PMFS History: Patient Active Problem List   Diagnosis Date Noted   Urge incontinence 07/26/2021   Urinary retention 04/15/2021   Acute kidney injury ([facility] 04/12/2021   Acute renal failure superimposed on chronic kidney disease, unspecified CKD stage, unspecified acute renal failure type ([facility] 04/12/2021   Acute renal failure superimposed on stage 3b chronic kidney disease ([facility] 04/12/2021   Dehydration    Spondylolisthesis, lumbar region    Spinal stenosis, lumbar region, with neurogenic claudication    Dysphagia 03/18/2021   Speech disturbance 03/18/2021   Wheezing 03/18/2021   Ileus ([facility] [place] 03/18/2021   Fusion of spine of lumbar region 03/15/2021   Cerebrovascular disease 12/20/2020   GERD (gastroesophageal reflux disease) 11/18/2020   Type 2 diabetes mellitus with stage 3b chronic kidney disease, without long-term current  use of insulin ([place] 07/12/2020   Mixed hyperlipidemia 07/12/2020   Essential hypertension, benign 07/12/2020   Iron deficiency anemia    Loss of weight 04/09/2020   Dyspepsia 01/28/2020   Constipation 01/28/2020   Lower abdominal pain 01/28/2020   Normocytic anemia 01/28/2020   Past Medical History:  Diagnosis Date   Acid reflux disease    Anemia    Anxiety    Asthma    Breast cancer ([facility]    left side   Chronic back pain    Chronic kidney disease    Depression    Diabetes mellitus without complication ([facility]    Diverticulitis    Hypertension    Lumbosacral spondylosis    Per previous PCP records   Sleep apnea    Thyroid disease     Family History  Problem Relation Age  of Onset   Diabetes Mother    Thyroid disease Mother    Colon cancer Neg Hx     Past Surgical History:  Procedure Laterality Date   ABDOMINAL HYSTERECTOMY     BACK SURGERY     BIOPSY  02/19/2020   Procedure: BIOPSY;  Surgeon: Eloise Harman, DO;  [facility];  Service: Endoscopy;;   BREAST LUMPECTOMY Left 2003   BREAST SURGERY     COLONOSCOPY WITH PROPOFOL N/A 02/19/2020   non-bleeding internal hemorrhoids, many small-mouthed diverticula in entire colon.   ESOPHAGOGASTRODUODENOSCOPY (EGD) WITH PROPOFOL N/A 02/19/2020    benign-appearing esophageal stenosis s/p dilation, gastritis s/p biopsy, normal duodenum. Negative H.pylori, negative duodenal biopsy.    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

## 2021-12-08 ENCOUNTER — Ambulatory Visit: Payer: Self-pay | Admitting: Gastroenterology

## 2021-12-16 ENCOUNTER — Ambulatory Visit (HOSPITAL_COMMUNITY)
Admission: RE | Admit: 2021-12-16 | Discharge: 2021-12-16 | Disposition: A | Discharge status: Home / Self Care | Payer: Medicare Other | Source: Ambulatory Visit | Attending: Cardiology | Admitting: Cardiology

## 2021-12-16 DIAGNOSIS — I739 Peripheral vascular disease, unspecified: Secondary | ICD-10-CM

## 2021-12-16 DIAGNOSIS — R29818 Other symptoms and signs involving the nervous system: Secondary | ICD-10-CM | POA: Diagnosis not present

## 2021-12-19 ENCOUNTER — Encounter: Payer: Self-pay | Admitting: Nurse Practitioner

## 2021-12-19 ENCOUNTER — Ambulatory Visit (INDEPENDENT_AMBULATORY_CARE_PROVIDER_SITE_OTHER): Payer: Medicare Other | Admitting: Nurse Practitioner

## 2021-12-19 VITALS — BP 141/83 | HR 93 | Ht 63.0 in | Wt 179.0 lb

## 2021-12-19 DIAGNOSIS — I10 Essential (primary) hypertension: Secondary | ICD-10-CM

## 2021-12-19 DIAGNOSIS — E1122 Type 2 diabetes mellitus with diabetic chronic kidney disease: Secondary | ICD-10-CM

## 2021-12-19 DIAGNOSIS — E782 Mixed hyperlipidemia: Secondary | ICD-10-CM

## 2021-12-19 DIAGNOSIS — N1832 Chronic kidney disease, stage 3b: Secondary | ICD-10-CM | POA: Diagnosis not present

## 2021-12-19 LAB — POCT GLYCOSYLATED HEMOGLOBIN (HGB A1C): HbA1c POC (<> result, manual entry): 8.7 % (ref 4.0–5.6)

## 2021-12-19 MED ORDER — PANTOPRAZOLE SODIUM 40 MG PO TBEC: 40.0000 mg | DELAYED_RELEASE_TABLET | Freq: Every day | ORAL | 3 refills | Status: DC

## 2021-12-19 NOTE — Progress Notes (Signed)
12/19/2021, 2:26 PM  Endocrinology follow-up note   Subjective:    Patient ID: Stephanie Moreno, female    DOB: August 23, 1945.  Stephanie Moreno is being seen in follow-up after she was seen in consultation for management of currently uncontrolled symptomatic diabetes requested by  Margretta Sidle, MD.   Past Medical History:  Diagnosis Date   Acid reflux disease    Anemia    Anxiety    Asthma    Breast cancer ([place])    left side   Chronic back pain    Chronic kidney disease    Diabetes mellitus without complication ([place])    Diverticulitis    Hypertension    Lumbosacral spondylosis    Per previous PCP records   Sleep apnea    Thyroid disease     Past Surgical History:  Procedure Laterality Date   ABDOMINAL HYSTERECTOMY     BACK SURGERY     BIOPSY  02/19/2020   Procedure: BIOPSY;  Surgeon: Eloise Harman, DO;  Location: [facility];  Service: Endoscopy;;   BREAST LUMPECTOMY Left 2003   BREAST SURGERY     COLONOSCOPY WITH PROPOFOL N/A 02/19/2020   non-bleeding internal hemorrhoids, many small-mouthed diverticula in entire colon.   ESOPHAGOGASTRODUODENOSCOPY (EGD) WITH PROPOFOL N/A 02/19/2020    benign-appearing esophageal stenosis s/p dilation, gastritis s/p biopsy, normal duodenum. Negative H.pylori, negative duodenal biopsy.    GIVENS CAPSULE STUDY N/A 06/14/2020   Procedure: GIVENS CAPSULE STUDY;  Surgeon: Eloise Harman, DO;  Location: [facility];  Service: Endoscopy;  Laterality: N/A;  7:30am   THYROIDECTOMY     tummy tuck     at time of breast surgery 2003    Social History   Socioeconomic History   Marital status: Divorced    Spouse name: Not on file   Number of children: Not on file   Years of education: Not on file   Highest education level: Not on file  Occupational History   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 [place]  Tobacco Use   Smoking status: Never   Smokeless tobacco: Never  Vaping Use    Vaping Use: Never used  Substance and Sexual Activity   Alcohol use: Not Currently   Drug use: Not Currently   Sexual activity: Not on file  Other Topics Concern   Not on file  Social History Narrative   Not on file   Social Determinants of Health   Financial Resource Strain: Not on file  Food Insecurity: Not on file  Transportation Needs: Not on file  Physical Activity: Not on file  Stress: Not on file  Social Connections: Not on file    Family History  Problem Relation Age of Onset   Diabetes Mother    Thyroid disease Mother    Colon cancer Neg Hx     Outpatient Encounter Medications as of 12/19/2021  Medication Sig   acetaminophen (TYLENOL) 325 MG tablet Take 650 mg by mouth every 4 (four) hours as needed.   amitriptyline (ELAVIL) 25 MG tablet Take 25 mg by mouth at bedtime.   Azelastine HCl 0.15 % SOLN Place 1 spray into both nostrils daily.   calcium-vitamin D (OSCAL WITH D) 500-200 MG-UNIT TABS tablet Take 1 tablet by mouth 3 (three) times daily.   docusate sodium (COLACE) 100 MG capsule TAKE 1 CAPSULE BY MOUTH TWICE A DAY   fluticasone (FLONASE) 50 MCG/ACT nasal spray  Place 2 sprays into both nostrils daily.   Fluticasone Furoate (ARNUITY ELLIPTA) 200 MCG/ACT AEPB Inhale 1 puff into the lungs daily.   furosemide (LASIX) 20 MG tablet Take 1 tablet (20 mg total) by mouth every other day. Takes 40 mg every other day. On alternate days take 80 mg   glipiZIDE (GLUCOTROL XL) 5 MG 24 hr tablet Take 1 tablet (5 mg total) by mouth daily with breakfast.   HYDROcodone-acetaminophen (NORCO) 10-325 MG tablet Take 0.5 tablets by mouth every 4 (four) hours as needed.   insulin degludec (TRESIBA FLEXTOUCH) 100 UNIT/ML FlexTouch Pen Inject 20 Units into the skin at bedtime.   LINZESS 145 MCG CAPS capsule Take 145 mcg by mouth every morning.   losartan (COZAAR) 100 MG tablet Take 100 mg by mouth daily.   lubiprostone (AMITIZA) 8 MCG capsule Take 8 mcg by mouth 2 (two) times daily with a  meal.   magnesium oxide (MAG-OX) 400 MG tablet Take 400 mg by mouth daily.   meclizine (ANTIVERT) 25 MG tablet Take 25 mg by mouth 3 (three) times daily as needed for dizziness.   methocarbamol (ROBAXIN) 500 MG tablet Take 1 tablet (500 mg total) by mouth every 6 (six) hours as needed for muscle spasms.   mirabegron ER (MYRBETRIQ) 50 MG TB24 tablet Take 1 tablet (50 mg total) by mouth daily.   Multiple Vitamins-Minerals (MULTIVITAMIN WITH MINERALS) tablet Take 1 tablet by mouth daily.   ondansetron (ZOFRAN-ODT) 4 MG disintegrating tablet Take 4 mg by mouth every 8 (eight) hours as needed.   pantoprazole (PROTONIX) 40 MG tablet Take 1 tablet (40 mg total) by mouth daily. 30 minutes before breakfast   polyethylene glycol (MIRALAX / GLYCOLAX) 17 g packet Take 17 g by mouth daily as needed for mild constipation. (Patient taking differently: Take 17 g by mouth daily.)   pregabalin (LYRICA) 50 MG capsule Take 1 capsule (50 mg total) by mouth at bedtime.   rosuvastatin (CRESTOR) 10 MG tablet Take 10 mg by mouth at bedtime.    sucralfate (CARAFATE) 1 g tablet Take 1 g by mouth 3 (three) times daily.   TRULICITY 3 GU/5.4YH SOPN SMARTSIG:3 Milligram(s) SUB-Q Once a Week   [DISCONTINUED] losartan (COZAAR) 50 MG tablet Take 50 mg by mouth daily.   [DISCONTINUED] pantoprazole (PROTONIX) 40 MG tablet Take 1 tablet (40 mg total) by mouth daily. 30 minutes before breakfast   [DISCONTINUED] traMADol (ULTRAM) 50 MG tablet Take 50 mg by mouth every 6 (six) hours as needed.   No facility-administered encounter medications on file as of 12/19/2021.    ALLERGIES: Allergies  Allergen Reactions   Codeine Nausea And Vomiting and Anaphylaxis   Carvedilol     Cold sweats, diarrhea, severe hot flashes   Hydrochlorothiazide Other (See Comments)    Other reaction(s): stomach upset   Lisinopril Nausea And Vomiting    Other reaction(s): cough   Oxybutynin Other (See Comments)   Penicillins Hives    VACCINATION  STATUS: Immunization History  Administered Date(s) Administered   Fluad Quad(high Dose 65+) 03/17/2021   Pneumococcal Conjugate-13 09/02/2014   Pneumococcal Polysaccharide-23 07/03/2010   Tdap 07/03/2000    Diabetes She presents for her follow-up diabetic visit. She has type 2 diabetes mellitus. Onset time: Diagnosed at approx age of 10. Her disease course has been stable. There are no hypoglycemic associated symptoms. Associated symptoms include fatigue and foot paresthesias. Pertinent negatives for diabetes include no polydipsia, no polyuria and no weight loss. There are no hypoglycemic complications.  Symptoms are stable. Diabetic complications include a CVA, nephropathy and peripheral neuropathy. Risk factors for coronary artery disease include diabetes mellitus, dyslipidemia, hypertension, post-menopausal and sedentary lifestyle. Current diabetic treatment includes oral agent (monotherapy) and insulin injections (and Trulicity). She is compliant with treatment most of the time. Her weight is fluctuating minimally. She is following a generally healthy diet. When asked about meal planning, she reported none. She has not had a previous visit with a dietitian. She rarely participates in exercise. Her home blood glucose trend is decreasing steadily. Her overall blood glucose range is 180-200 mg/dl. (She presents today with her CGM, no logs, showing at target fasting and slightly above target postprandial glycemic profile.  Her POCT A1c today is 8.7%, increasing from last visit.  She notes she has had steroid injections in her back and is scheduled to have more next month which causes her glucose to go "HI".  Analysis of her CGM shows TIR 54%, TAR 46%, TBR 0% with a GMI of 8%.  She denies any hypoglycemia.) An ACE inhibitor/angiotensin II receptor blocker is being taken. She does not see a podiatrist.Eye exam is current.  Hypertension This is a chronic problem. The current episode started more than 1 year  ago. The problem is unchanged. The problem is controlled. Associated symptoms include peripheral edema. Agents associated with hypertension include thyroid hormones. Risk factors for coronary artery disease include diabetes mellitus, dyslipidemia, post-menopausal state and sedentary lifestyle. Past treatments include diuretics, beta blockers, ACE inhibitors and angiotensin blockers. There are no compliance problems.  Hypertensive end-organ damage includes kidney disease and CVA. Identifiable causes of hypertension include chronic renal disease, sleep apnea and a thyroid problem.  Hyperlipidemia This is a chronic problem. The current episode started more than 1 year ago. The problem is controlled. Recent lipid tests were reviewed and are normal. Exacerbating diseases include chronic renal disease and hypothyroidism. Factors aggravating her hyperlipidemia include beta blockers. Current antihyperlipidemic treatment includes statins. The current treatment provides moderate improvement of lipids. There are no compliance problems.  Risk factors for coronary artery disease include diabetes mellitus, dyslipidemia, hypertension, obesity, post-menopausal and a sedentary lifestyle.     Review of systems  Constitutional: + minimally fluctuating body weight,  current Body mass index is 31.71 kg/m. , + fatigue, no subjective hyperthermia, no subjective hypothermia Eyes: no blurry vision, no xerophthalmia ENT: no sore throat, no nodules palpated in throat, no dysphagia/odynophagia, no hoarseness Cardiovascular: no chest pain, no shortness of breath, no palpitations, + leg swelling Respiratory: no cough, no shortness of breath Gastrointestinal: no nausea/vomiting/diarrhea Musculoskeletal: ongoing back pain, walks with walker due to previous CVA and disequilibrium- has frequent falls Skin: no rashes, no hyperemia Neurological: no tremors, no dizziness, + numbness/tingling to BLE (on gabapentin) Psychiatric: no  depression, no anxiety  Objective:     BP (!) 141/83   Pulse 93   Ht '5\' 3"'$  (1.6 m)   Wt 179 lb (81.2 kg)   BMI 31.71 kg/m   Wt Readings from Last 3 Encounters:  12/19/21 179 lb (81.2 kg)  12/07/21 173 lb (78.5 kg)  11/25/21 175 lb (79.4 kg)    BP Readings from Last 3 Encounters:  12/19/21 (!) 141/83  12/07/21 (!) 142/85  11/25/21 (!) 167/91     Physical Exam- Limited  Constitutional:  Body mass index is 31.71 kg/m. , not in acute distress, normal state of mind Eyes:  EOMI, no exophthalmos Neck: Supple Cardiovascular: RRR, no murmurs, rubs, or gallops, + pitting edema to BLE Respiratory:  Adequate breathing efforts, no crackles, rales, rhonchi, or wheezing Musculoskeletal: no gross deformities,  walks with walker due to recent back surgery and frequent falls Skin:  no rashes, no hyperemia Neurological: no tremor with outstretched hands     CMP ( most recent) CMP     Component Value Date/Time   NA 133 (L) 08/02/2021 1138   NA 138 04/19/2021 0000   K 3.7 08/02/2021 1138   CL 102 08/02/2021 1138   CO2 24 08/02/2021 1138   GLUCOSE 181 (H) 08/02/2021 1138   BUN 21 08/02/2021 1138   BUN 17 04/19/2021 0000   CREATININE 1.10 (H) 08/02/2021 1138   CREATININE 1.42 (H) 10/12/2020 1009   CALCIUM 9.1 08/02/2021 1138   PROT 9.2 (H) 04/11/2021 2248   ALBUMIN 3.8 08/02/2021 1138   AST 27 04/11/2021 2248   ALT 20 04/11/2021 2248   ALKPHOS 83 04/11/2021 2248   BILITOT 0.5 04/11/2021 2248   GFRNONAA 52 (L) 08/02/2021 1138   GFRNONAA 36 (L) 10/12/2020 1009   GFRAA 42 (L) 10/12/2020 1009     Diabetic Labs (most recent): Lab Results  Component Value Date   HGBA1C 8.7 12/19/2021   HGBA1C 7.8 (A) 09/16/2021   HGBA1C 7.2 04/19/2021   MICROALBUR 80 10/26/2020     Lipid Panel ( most recent) Lipid Panel     Component Value Date/Time   CHOL 91 04/19/2021 0000   TRIG 78 04/19/2021 0000   HDL 42 04/19/2021 0000   CHOLHDL 2.0 10/12/2020 1009   LDLCALC 34 04/19/2021  0000   LDLCALC 46 10/12/2020 1009      Lab Results  Component Value Date   TSH 3.94 10/12/2020   TSH 1.18 03/09/2020      Assessment & Plan:   1) Type 2 diabetes mellitus with stage 3b chronic kidney disease, without long-term current use of insulin ([place])  - Stephanie Moreno has currently uncontrolled symptomatic type 2 DM since  76 years of age.  She presents today with her CGM, no logs, showing at target fasting and slightly above target postprandial glycemic profile.  Her POCT A1c today is 8.7%, increasing from last visit.  She notes she has had steroid injections in her back and is scheduled to have more next month which causes her glucose to go "HI".  Analysis of her CGM shows TIR 54%, TAR 46%, TBR 0% with a GMI of 8%.  She denies any hypoglycemia.   Recent labs reviewed.    - I had a long discussion with her about the progressive nature of diabetes and the pathology behind its complications. -her diabetes is complicated by CKD, CVA and she remains at a high risk for more acute and chronic complications which include CAD, CVA, CKD, retinopathy, and neuropathy. These are all discussed in detail with her.  - Nutritional counseling repeated at each appointment due to patients tendency to fall back in to old habits.  - The patient admits there is a room for improvement in their diet and drink choices. -  Suggestion is made for the patient to avoid simple carbohydrates from their diet including Cakes, Sweet Desserts / Pastries, Ice Cream, Soda (diet and regular), Sweet Tea, Candies, Chips, Cookies, Sweet Pastries, Store Bought Juices, Alcohol in Excess of 1-2 drinks a day, Artificial Sweeteners, Coffee Creamer, and "Sugar-free" Products. This will help patient to have stable blood glucose profile and potentially avoid unintended weight gain.   - I encouraged the patient to switch to unprocessed or minimally processed complex  starch and increased protein intake (animal or plant source),  fruits, and vegetables.   - Patient is advised to stick to a routine mealtimes to eat 3 meals a day and avoid unnecessary snacks (to snack only to correct hypoglycemia).  - she will be scheduled with Jearld Fenton, RDN, CDE for diabetes education.  - I have approached her with the following individualized plan to manage  her diabetes and patient agrees:   - she will need at least basal insulin in order for her to achieve control of diabetes to target, and she accepts this treatment option at this time.    -She is advised to continue Tresiba 20 units SQ nightly, Glipizide to 5 mg XL daily with breakfast, and continue her Trulicity 3 mg SQ weekly.    -She is encouraged to continue monitoring blood glucose twice daily (using her CGM), before breakfast and before bed, and to call the clinic if she has readings less than 70 or greater than 300 for 3 tests in a row.   - Specific targets for  A1c;  LDL, HDL,  and Triglycerides were discussed with the patient.  2) Blood Pressure /Hypertension:   Her blood pressure is controlled to target for her age.   she is advised to continue her current medications including Losartan 100 mg p.o. daily with breakfast, and Lasix 40 mg and 80 mg on alternating days.  3) Lipids/Hyperlipidemia:    Review of her recent lipid panel from 04/19/21 showed controlled LDL at 34.  she  is advised to continue Crestor 10 mg p.o. daily at bedtime.   Side effects and precautions discussed with her.  4)  Weight/Diet:  Her Body mass index is 31.71 kg/m.  -   clearly complicating her diabetes care.   she is  a candidate for weight loss. I discussed with her the fact that loss of 5 - 10% of her  current body weight will have the most impact on her diabetes management.  Exercise, and detailed carbohydrates information provided  -  detailed on discharge instructions.  5) Chronic Care/Health Maintenance: -she  is on ACEI/ARB and Statin medications and  is encouraged to initiate and  continue to follow up with Ophthalmology, Dentist,  Podiatrist at least yearly or according to recommendations, and advised to  stay away from smoking. I have recommended yearly flu vaccine and pneumonia vaccine at least every 5 years; she cannot exercise optimally due to her disequilibrium.  She is advised to sleep for at least 7 hours a day.  - she is  advised to maintain close follow up with Margretta Sidle, MD for primary care needs, as well as her other providers for optimal and coordinated care.      I spent 43 minutes in the care of the patient today including review of labs from [place], Lipids, Thyroid Function, Hematology (current and previous including abstractions from other facilities); face-to-face time discussing  her blood glucose readings/logs, discussing hypoglycemia and hyperglycemia episodes and symptoms, medications doses, her options of short and long term treatment based on the latest standards of care / guidelines;  discussion about incorporating lifestyle medicine;  and documenting the encounter.    Please refer to Patient Instructions for Blood Glucose Monitoring and Insulin/Medications Dosing Guide"  in media tab for additional information. Please  also refer to " Patient Self Inventory" in the Media  tab for reviewed elements of pertinent patient history.  Stephanie Moreno participated in the discussions, expressed understanding, and voiced agreement  with the above plans.  All questions were answered to her satisfaction. she is encouraged to contact clinic should she have any questions or concerns prior to her return visit.   Follow up plan: - Return in about 3 months (around 03/21/2022) for Diabetes F/U with A1c in office, No previsit labs, Bring meter and logs.  Rayetta Pigg, [facility] [address] [place], [place] [address] Phone: [phone] Fax: [phone]  12/19/2021, 2:26 PM

## 2021-12-19 NOTE — Patient Instructions (Signed)
Diabetes Mellitus and Foot Care Foot care is an important part of your health, especially when you have diabetes. Diabetes may cause you to have problems because of poor blood flow (circulation) to your feet and legs, which can cause your skin to: Become thinner and drier. Break more easily. Heal more slowly. Peel and crack. You may also have nerve damage (neuropathy) in your legs and feet, causing decreased feeling in them. This means that you may not notice minor injuries to your feet that could lead to more serious problems. Noticing and addressing any potential problems early is the best way to prevent future foot problems. How to care for your feet Foot hygiene  Wash your feet daily with warm water and mild soap. Do not use hot water. Then, pat your feet and the areas between your toes until they are completely dry. Do not soak your feet as this can dry your skin. Trim your toenails straight across. Do not dig under them or around the cuticle. File the edges of your nails with an emery board or nail file. Apply a moisturizing lotion or petroleum jelly to the skin on your feet and to dry, brittle toenails. Use lotion that does not contain alcohol and is unscented. Do not apply lotion between your toes. Shoes and socks Wear clean socks or stockings every day. Make sure they are not too tight. Do not wear knee-high stockings since they may decrease blood flow to your legs. Wear shoes that fit properly and have enough cushioning. Always look in your shoes before you put them on to be sure there are no objects inside. To break in new shoes, wear them for just a few hours a day. This prevents injuries on your feet. Wounds, scrapes, corns, and calluses  Check your feet daily for blisters, cuts, bruises, sores, and redness. If you cannot see the bottom of your feet, use a mirror or ask someone for help. Do not cut corns or calluses or try to remove them with medicine. If you find a minor scrape,  cut, or break in the skin on your feet, keep it and the skin around it clean and dry. You may clean these areas with mild soap and water. Do not clean the area with peroxide, alcohol, or iodine. If you have a wound, scrape, corn, or callus on your foot, look at it several times a day to make sure it is healing and not infected. Check for: Redness, swelling, or pain. Fluid or blood. Warmth. Pus or a bad smell. General tips Do not cross your legs. This may decrease blood flow to your feet. Do not use heating pads or hot water bottles on your feet. They may burn your skin. If you have lost feeling in your feet or legs, you may not know this is happening until it is too late. Protect your feet from hot and cold by wearing shoes, such as at the beach or on hot pavement. Schedule a complete foot exam at least once a year (annually) or more often if you have foot problems. Report any cuts, sores, or bruises to your health care provider immediately. Where to find more information American Diabetes Association: www.diabetes.org Association of Diabetes Care & Education Specialists: www.diabeteseducator.org Contact a health care provider if: You have a medical condition that increases your risk of infection and you have any cuts, sores, or bruises on your feet. You have an injury that is not healing. You have redness on your legs or feet. You   feel burning or tingling in your legs or feet. You have pain or cramps in your legs and feet. Your legs or feet are numb. Your feet always feel cold. You have pain around any toenails. Get help right away if: You have a wound, scrape, corn, or callus on your foot and: You have pain, swelling, or redness that gets worse. You have fluid or blood coming from the wound, scrape, corn, or callus. Your wound, scrape, corn, or callus feels warm to the touch. You have pus or a bad smell coming from the wound, scrape, corn, or callus. You have a fever. You have a red  line going up your leg. Summary Check your feet every day for blisters, cuts, bruises, sores, and redness. Apply a moisturizing lotion or petroleum jelly to the skin on your feet and to dry, brittle toenails. Wear shoes that fit properly and have enough cushioning. If you have foot problems, report any cuts, sores, or bruises to your health care provider immediately. Schedule a complete foot exam at least once a year (annually) or more often if you have foot problems. This information is not intended to replace advice given to you by your health care provider. Make sure you discuss any questions you have with your health care provider. Document Revised: 01/08/2020 Document Reviewed: 01/08/2020 Elsevier Patient Education  2023 [employer]  

## 2021-12-22 ENCOUNTER — Encounter: Payer: Self-pay | Admitting: Podiatry

## 2021-12-26 ENCOUNTER — Ambulatory Visit (INDEPENDENT_AMBULATORY_CARE_PROVIDER_SITE_OTHER): Payer: Medicare Other | Admitting: Urology

## 2021-12-26 ENCOUNTER — Encounter: Payer: Self-pay | Admitting: Urology

## 2021-12-26 VITALS — BP 149/87 | HR 85 | Ht 63.0 in | Wt 182.0 lb

## 2021-12-26 DIAGNOSIS — Z87440 Personal history of urinary (tract) infections: Secondary | ICD-10-CM | POA: Diagnosis not present

## 2021-12-26 DIAGNOSIS — Z87898 Personal history of other specified conditions: Secondary | ICD-10-CM

## 2021-12-26 DIAGNOSIS — R339 Retention of urine, unspecified: Secondary | ICD-10-CM

## 2021-12-26 DIAGNOSIS — N3941 Urge incontinence: Secondary | ICD-10-CM

## 2021-12-26 LAB — URINALYSIS, ROUTINE W REFLEX MICROSCOPIC
Bilirubin, UA: NEGATIVE
Ketones, UA: NEGATIVE
Nitrite, UA: NEGATIVE
Protein,UA: NEGATIVE
Specific Gravity, UA: <1.005 — ABNORMAL LOW (ref 1.005–1.030)
Urobilinogen, Ur: 0.2 mg/dL (ref 0.2–1.0)
pH, UA: 5.5 (ref 5.0–7.5)

## 2021-12-26 LAB — BLADDER SCAN AMB NON-IMAGING: Scan Result: 96

## 2021-12-26 LAB — MICROSCOPIC EXAMINATION
Bacteria, UA: NONE SEEN
RBC, Urine: NONE SEEN /hpf (ref 0–2)
Renal Epithel, UA: NONE SEEN /hpf

## 2022-01-04 ENCOUNTER — Ambulatory Visit (INDEPENDENT_AMBULATORY_CARE_PROVIDER_SITE_OTHER): Payer: Medicare Other | Admitting: Specialist

## 2022-01-04 ENCOUNTER — Encounter: Payer: Self-pay | Admitting: Specialist

## 2022-01-04 VITALS — BP 144/82 | HR 93 | Ht 63.0 in | Wt 182.0 lb

## 2022-01-04 DIAGNOSIS — M5416 Radiculopathy, lumbar region: Secondary | ICD-10-CM

## 2022-01-04 DIAGNOSIS — M48062 Spinal stenosis, lumbar region with neurogenic claudication: Secondary | ICD-10-CM

## 2022-01-04 DIAGNOSIS — G603 Idiopathic progressive neuropathy: Secondary | ICD-10-CM

## 2022-01-04 DIAGNOSIS — R29818 Other symptoms and signs involving the nervous system: Secondary | ICD-10-CM

## 2022-01-04 DIAGNOSIS — I739 Peripheral vascular disease, unspecified: Secondary | ICD-10-CM | POA: Diagnosis not present

## 2022-01-04 DIAGNOSIS — M4317 Spondylolisthesis, lumbosacral region: Secondary | ICD-10-CM

## 2022-01-04 DIAGNOSIS — M4726 Other spondylosis with radiculopathy, lumbar region: Secondary | ICD-10-CM

## 2022-01-04 MED ORDER — SUCRALFATE 1 G PO TABS: 1.0000 g | ORAL_TABLET | Freq: Three times a day (TID) | ORAL | 4 refills | Status: DC

## 2022-01-04 MED ORDER — ASPIRIN 81 MG PO TBEC: 81.0000 mg | DELAYED_RELEASE_TABLET | Freq: Every day | ORAL | 3 refills | Status: DC

## 2022-01-04 MED ORDER — DOCUSATE SODIUM 100 MG PO CAPS
100.0000 mg | ORAL_CAPSULE | Freq: Two times a day (BID) | ORAL | 6 refills | Status: DC
Start: 2022-01-04 — End: 2023-01-29

## 2022-01-04 NOTE — Patient Instructions (Signed)
Avoid bending, stooping and avoid lifting weights greater than 10 lbs. Avoid prolong standing and walking. Avoid frequent bending and stooping  No lifting greater than 10 lbs. May use ice or moist heat for pain. Weight loss is of benefit. Handicap license is approved. Dr. Romona Curls secretary/Assistant will call to arrange for epidural steroid injection   Fall Prevention and Home Safety Falls cause injuries and can affect all age groups. It is possible to use preventive measures to significantly decrease the likelihood of falls. There are many simple measures which can make your home safer and prevent falls. OUTDOORS Repair cracks and edges of walkways and driveways. Remove high doorway thresholds. Trim shrubbery on the main path into your home. Have good outside lighting. Clear walkways of tools, rocks, debris, and clutter. Check that handrails are not broken and are securely fastened. Both sides of steps should have handrails. Have leaves, snow, and ice cleared regularly. Use sand or salt on walkways during winter months. In the garage, clean up grease or oil spills. BATHROOM Install night lights. Install grab bars by the toilet and in the tub and shower. Use non-skid mats or decals in the tub or shower. Place a plastic non-slip stool in the shower to sit on, if needed. Keep floors dry and clean up all water on the floor immediately. Remove soap buildup in the tub or shower on a regular basis. Secure bath mats with non-slip, double-sided rug tape. Remove throw rugs and tripping hazards from the floors. BEDROOMS Install night lights. Make sure a bedside light is easy to reach. Do not use oversized bedding. Keep a telephone by your bedside. Have a firm chair with side arms to use for getting dressed. Remove throw rugs and tripping hazards from the floor. KITCHEN Keep handles on pots and pans turned toward the center of the stove. Use back burners when possible. Clean up spills  quickly and allow time for drying. Avoid walking on wet floors. Avoid hot utensils and knives. Position shelves so they are not too high or low. Place commonly used objects within easy reach. If necessary, use a sturdy step stool with a grab bar when reaching. Keep electrical cables out of the way. Do not use floor polish or wax that makes floors slippery. If you must use wax, use non-skid floor wax. Remove throw rugs and tripping hazards from the floor. STAIRWAYS Never leave objects on stairs. Place handrails on both sides of stairways and use them. Fix any loose handrails. Make sure handrails on both sides of the stairways are as long as the stairs. Check carpeting to make sure it is firmly attached along stairs. Make repairs to worn or loose carpet promptly. Avoid placing throw rugs at the top or bottom of stairways, or properly secure the rug with carpet tape to prevent slippage. Get rid of throw rugs, if possible. Have an electrician put in a light switch at the top and bottom of the stairs. OTHER FALL PREVENTION TIPS Wear low-heel or rubber-soled shoes that are supportive and fit well. Wear closed toe shoes. When using a stepladder, make sure it is fully opened and both spreaders are firmly locked. Do not climb a closed stepladder. Add color or contrast paint or tape to grab bars and handrails in your home. Place contrasting color strips on first and last steps. Learn and use mobility aids as needed. Install an electrical emergency response system. Turn on lights to avoid dark areas. Replace light bulbs that burn out immediately. Get light switches  that glow. Arrange furniture to create clear pathways. Keep furniture in the same place. Firmly attach carpet with non-skid or double-sided tape. Eliminate uneven floor surfaces. Select a carpet pattern that does not visually hide the edge of steps. Be aware of all pets. OTHER HOME SAFETY TIPS Set the water temperature for 120 F (48.8  C). Keep emergency numbers on or near the telephone. Keep smoke detectors on every level of the home and near sleeping areas. Document Released: 06/09/2002 Document Revised: 12/19/2011 Document Reviewed: 09/08/2011 [facility] Patient Information 2014 [place].

## 2022-01-04 NOTE — Progress Notes (Addendum)
Office Visit Note   Patient: Stephanie Moreno           Date of Birth: 01-May-1946           MRN: [id number] Visit Date: 01/04/2022              Requested by: Margretta Sidle, [address],  [place] [address] PCP: Margretta Sidle, MD   Assessment & Plan: Visit Diagnoses:  1. Neurogenic claudication   2. Peripheral vascular disease with claudication ([place])   3. Idiopathic progressive neuropathy   4. Lumbar radiculopathy   5. Other spondylosis with radiculopathy, lumbar region   6. Spinal stenosis, lumbar region, with neurogenic claudication   7. 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 Avoid prolong standing and walking. Avoid frequent bending and stooping  No lifting greater than 10 lbs. May use ice or moist heat for pain. Weight loss is of benefit. Handicap license is approved. Dr. Romona Curls secretary/Assistant will call to arrange for epidural steroid injection   Fall Prevention and Home Safety Falls cause injuries and can affect all age groups. It is possible to use preventive measures to significantly decrease the likelihood of falls. There are many simple measures which can make your home safer and prevent falls. OUTDOORS Repair cracks and edges of walkways and driveways. Remove high doorway thresholds. Trim shrubbery on the main path into your home. Have good outside lighting. Clear walkways of tools, rocks, debris, and clutter. Check that handrails are not broken and are securely fastened. Both sides of steps should have handrails. Have leaves, snow, and ice cleared regularly. Use sand or salt on walkways during winter months. In the garage, clean up grease or oil spills. BATHROOM Install night lights. Install grab bars by the toilet and in the tub and shower. Use non-skid mats or decals in the tub or shower. Place a plastic non-slip stool in the shower to sit on, if needed. Keep  floors dry and clean up all water on the floor immediately. Remove soap buildup in the tub or shower on a regular basis. Secure bath mats with non-slip, double-sided rug tape. Remove throw rugs and tripping hazards from the floors. BEDROOMS Install night lights. Make sure a bedside light is easy to reach. Do not use oversized bedding. Keep a telephone by your bedside. Have a firm chair with side arms to use for getting dressed. Remove throw rugs and tripping hazards from the floor. KITCHEN Keep handles on pots and pans turned toward the center of the stove. Use back burners when possible. Clean up spills quickly and allow time for drying. Avoid walking on wet floors. Avoid hot utensils and knives. Position shelves so they are not too high or low. Place commonly used objects within easy reach. If necessary, use a sturdy step stool with a grab bar when reaching. Keep electrical cables out of the way. Do not use floor polish or wax that makes floors slippery. If you must use wax, use non-skid floor wax. Remove throw rugs and tripping hazards from the floor. STAIRWAYS Never leave objects on stairs. Place handrails on both sides of stairways and use them. Fix any loose handrails. Make sure handrails on both sides of the stairways are as long as the stairs. Check carpeting to make sure it is firmly attached along stairs. Make repairs to worn or loose carpet promptly. Avoid placing throw rugs at the top or bottom of  stairways, or properly secure the rug with carpet tape to prevent slippage. Get rid of throw rugs, if possible. Have an electrician put in a light switch at the top and bottom of the stairs. OTHER FALL PREVENTION TIPS Wear low-heel or rubber-soled shoes that are supportive and fit well. Wear closed toe shoes. When using a stepladder, make sure it is fully opened and both spreaders are firmly locked. Do not climb a closed stepladder. Add color or contrast paint or tape to grab bars  and handrails in your home. Place contrasting color strips on first and last steps. Learn and use mobility aids as needed. Install an electrical emergency response system. Turn on lights to avoid dark areas. Replace light bulbs that burn out immediately. Get light switches that glow. Arrange furniture to create clear pathways. Keep furniture in the same place. Firmly attach carpet with non-skid or double-sided tape. Eliminate uneven floor surfaces. Select a carpet pattern that does not visually hide the edge of steps. Be aware of all pets. OTHER HOME SAFETY TIPS Set the water temperature for 120 F (48.8 C). Keep emergency numbers on or near the telephone. Keep smoke detectors on every level of the home and near sleeping areas. Document Released: 06/09/2002 Document Revised: 12/19/2011 Document Reviewed: 09/08/2011 [facility] Patient Information 2014 [place].  Follow-Up Instructions: Return in about 3 months (around 04/06/2022).   Orders:  No orders of the defined types were placed in this encounter.  Meds ordered this encounter  Medications   DISCONTD: sucralfate (CARAFATE) 1 g tablet    Sig: Take 1 tablet (1 g total) by mouth 3 (three) times daily.    Dispense:  270 tablet    Refill:  4   DISCONTD: aspirin EC 81 MG tablet    Sig: Take 1 tablet (81 mg total) by mouth daily. Swallow whole.    Dispense:  90 tablet    Refill:  3   aspirin EC 81 MG tablet    Sig: Take 1 tablet (81 mg total) by mouth daily. Swallow whole.    Dispense:  90 tablet    Refill:  3   sucralfate (CARAFATE) 1 g tablet    Sig: Take 1 tablet (1 g total) by mouth 3 (three) times daily.    Dispense:  270 tablet    Refill:  4   docusate sodium (COLACE) 100 MG capsule    Sig: Take 1 capsule (100 mg total) by mouth 2 (two) times daily.    Dispense:  60 capsule    Refill:  6      Procedures: No procedures performed   Clinical Data: No additional findings.   Subjective: Chief Complaint   Patient presents with   Lower Back - Follow-up    HPI  Review of Systems   Objective: Vital Signs: BP (!) 144/82 (BP Location: Left Arm, Patient Position: Sitting)   Pulse 93   Ht '5\' 3"'$  (1.6 m)   Wt 182 lb (82.6 kg)   BMI 32.24 kg/m   Physical Exam  Ortho Exam  Specialty Comments:  MRI LUMBAR SPINE WITHOUT AND WITH CONTRAST   TECHNIQUE: Multiplanar and multiecho pulse sequences of the lumbar spine were obtained without and with intravenous contrast.   CONTRAST:  51m MULTIHANCE GADOBENATE DIMEGLUMINE 529 MG/ML IV SOLN   COMPARISON:  Lumbar MRI 10/22/2020   FINDINGS: Segmentation:  5 lumbar vertebra   Alignment:  Normal   Vertebrae: Normal bone marrow. Negative for fracture or mass. No evidence of spinal infection.  Conus medullaris and cauda equina: Conus extends to the L2-3 level. Conus and cauda equina appear normal.   Paraspinal and other soft tissues: Bilateral renal cysts. No paraspinous mass, adenopathy, or fluid collection   Disc levels:   L1-2: Negative   L2-3: Negative   L3-4: Mild facet degeneration. Negative for disc protrusion or stenosis   L4-5: Shallow central disc protrusion unchanged. Bilateral facet hypertrophy. Mild spinal stenosis improved likely due to laminotomy. Mild subarticular stenosis bilaterally.   L5-S1: Pedicle screw and interbody fusion. Negative for spinal or foraminal stenosis.   IMPRESSION: 1. 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 Interval pedicle screw and interbody fusion L5-S1. Negative for stenosis.     Electronically Signed   By: Franchot Gallo M.D.   On: 10/11/2021 14:03  Imaging: No results found.   PMFS History: Patient Active Problem List   Diagnosis Date Noted   Urge incontinence 07/26/2021   Urinary retention 04/15/2021   Acute kidney injury ([place]) 04/12/2021   Acute renal failure superimposed on chronic kidney disease, unspecified CKD stage, unspecified  acute renal failure type ([place]) 04/12/2021   Acute renal failure superimposed on stage 3b chronic kidney disease ([place]) 04/12/2021   Dehydration    Spondylolisthesis, lumbar region    Spinal stenosis, lumbar region, with neurogenic claudication    Dysphagia 03/18/2021   Speech disturbance 03/18/2021   Wheezing 03/18/2021   Ileus ([place]) 03/18/2021   Fusion of spine of lumbar region 03/15/2021   Cerebrovascular disease 12/20/2020   GERD (gastroesophageal reflux disease) 11/18/2020   Type 2 diabetes mellitus with stage 3b chronic kidney disease, without long-term current use of insulin ([place]) 07/12/2020   Mixed hyperlipidemia 07/12/2020   Essential hypertension, benign 07/12/2020   Iron deficiency anemia    Loss of weight 04/09/2020   Dyspepsia 01/28/2020   Constipation 01/28/2020   Lower abdominal pain 01/28/2020   Normocytic anemia 01/28/2020   Past Medical History:  Diagnosis Date   Acid reflux disease    Anemia    Anxiety    Asthma    Breast cancer ([place])    left side   Chronic back pain    Chronic kidney disease    Diabetes mellitus without complication ([place])    Diverticulitis    Hypertension    Lumbosacral spondylosis    Per previous PCP records   Sleep apnea    Thyroid disease     Family History  Problem Relation Age of Onset   Diabetes Mother    Thyroid disease Mother    Colon cancer Neg Hx     Past Surgical History:  Procedure Laterality Date   ABDOMINAL HYSTERECTOMY     BACK SURGERY     BIOPSY  02/19/2020   Procedure: BIOPSY;  Surgeon: Eloise Harman, DO;  Location: [facility];  Service: Endoscopy;;   BREAST LUMPECTOMY Left 2003   BREAST SURGERY     COLONOSCOPY WITH PROPOFOL N/A 02/19/2020   non-bleeding internal hemorrhoids, many small-mouthed diverticula in entire colon.   ESOPHAGOGASTRODUODENOSCOPY (EGD) WITH PROPOFOL N/A 02/19/2020    benign-appearing esophageal stenosis s/p dilation, gastritis s/p biopsy, normal duodenum. Negative H.pylori,  negative duodenal biopsy.    GIVENS CAPSULE STUDY N/A 06/14/2020   Procedure: GIVENS CAPSULE STUDY;  Surgeon: Eloise Harman, DO;  Location: [facility];  Service: Endoscopy;  Laterality: N/A;  7:30am   THYROIDECTOMY     tummy tuck     at time of breast surgery 2003   Social  History   Occupational History   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 [place]  Tobacco Use   Smoking status: Never   Smokeless tobacco: Never  Vaping Use   Vaping Use: Never used  Substance and Sexual Activity   Alcohol use: Not Currently   Drug use: Not Currently   Sexual activity: Not on file

## 2022-01-04 NOTE — Addendum Note (Signed)
Addended by: Basil Dess on: 01/04/2022 01:01 PM   Modules accepted: Orders

## 2022-01-05 ENCOUNTER — Ambulatory Visit: Payer: Medicare Other | Admitting: Gastroenterology

## 2022-01-09 ENCOUNTER — Telehealth: Payer: Self-pay | Admitting: Physical Medicine and Rehabilitation

## 2022-01-09 NOTE — Telephone Encounter (Signed)
Patient called needing to reschedule her appointment. The number to contact patient is [phone]

## 2022-01-12 ENCOUNTER — Encounter: Payer: Medicare Other | Admitting: Physical Medicine and Rehabilitation

## 2022-01-20 ENCOUNTER — Ambulatory Visit: Payer: Medicare Other | Admitting: Podiatry

## 2022-01-31 ENCOUNTER — Telehealth: Payer: Self-pay

## 2022-01-31 ENCOUNTER — Ambulatory Visit: Payer: Self-pay

## 2022-01-31 ENCOUNTER — Ambulatory Visit (INDEPENDENT_AMBULATORY_CARE_PROVIDER_SITE_OTHER): Payer: 59 | Admitting: Physical Medicine and Rehabilitation

## 2022-01-31 VITALS — BP 178/89 | HR 91

## 2022-01-31 DIAGNOSIS — M5416 Radiculopathy, lumbar region: Secondary | ICD-10-CM | POA: Diagnosis not present

## 2022-01-31 MED ORDER — DOXYCYCLINE HYCLATE 100 MG PO TABS: 100.0000 mg | ORAL_TABLET | Freq: Two times a day (BID) | ORAL | 0 refills | Status: DC

## 2022-01-31 MED ORDER — METHYLPREDNISOLONE ACETATE 80 MG/ML IJ SUSP
80.0000 mg | Freq: Once | INTRAMUSCULAR | Status: AC
  Administered 2022-01-31: 80 mg

## 2022-01-31 NOTE — Progress Notes (Signed)
Numeric Pain Rating Scale and Functional Assessment Average Pain 6   In the last MONTH (on 0-10 scale) has pain interfered with the following?  1. General activity like being  able to carry out your everyday physical activities such as walking, climbing stairs, carrying groceries, or moving a chair?  Rating(8)  Left sided lower back pain. No radiculopathy. Lately the pain has been bilateral. +Driver, -BT, -Dye Allergies.

## 2022-01-31 NOTE — Telephone Encounter (Signed)
Patient is aware 

## 2022-02-07 ENCOUNTER — Encounter: Payer: Self-pay | Admitting: *Deleted

## 2022-02-07 ENCOUNTER — Ambulatory Visit (INDEPENDENT_AMBULATORY_CARE_PROVIDER_SITE_OTHER): Payer: 59 | Admitting: Gastroenterology

## 2022-02-07 ENCOUNTER — Encounter: Payer: Self-pay | Admitting: Gastroenterology

## 2022-02-07 VITALS — BP 174/82 | HR 93 | Temp 97.4°F | Ht 63.0 in | Wt 181.0 lb

## 2022-02-07 DIAGNOSIS — R1310 Dysphagia, unspecified: Secondary | ICD-10-CM | POA: Diagnosis not present

## 2022-02-07 DIAGNOSIS — K219 Gastro-esophageal reflux disease without esophagitis: Secondary | ICD-10-CM

## 2022-02-07 MED ORDER — POLYETHYLENE GLYCOL 3350 17 G PO PACK: 17.0000 g | PACK | Freq: Every day | ORAL | 0 refills | Status: DC

## 2022-02-07 MED ORDER — PANTOPRAZOLE SODIUM 40 MG PO TBEC: 40.0000 mg | DELAYED_RELEASE_TABLET | Freq: Every day | ORAL | 3 refills | Status: DC

## 2022-02-07 NOTE — Progress Notes (Signed)
Gastroenterology Office Note     Primary Care Physician:  Margretta Sidle, MD  Primary Gastroenterologist: Dr. Abbey Chatters    Chief Complaint   Chief Complaint  Patient presents with   Follow-up    Pt is having severe heartburn. Also follow up on GERD     History of Present Illness   Stephanie DEDEAUX is a 76 y.o. female presenting today in follow-up with a history of abdominal pain and nausea, constipation, GERD, previously established with GI in [place] but moved to this area in June 2021. Colonoscopy and EGD completed 2021 with internal hemorrhoids on colonoscopy and benign-appearing esophageal stenosis s/p dilation, gastritis s/p biopsy, normal duodenum. Negative H.pylori, negative duodenal biopsy. Gallbladder remains in situ. Due to mildly dilated CBD on CT, MRI was completed which showed mild dilation of CBD measuring up to 11m, likely age-related. Fatty liver. Mildly elevated AST, isolated elevation. Negative Hep B surface antigen and Hep C antibody, no iron overload, component of IDA noted. Anemia multifactorial in setting of chronic disease. Due to worsening IDA, she underwent capsule study with few non-bleeding erosions in small bowl, single polypoid type lesion that did not appear significant.    Notes epigastric burning and feeling like food "sticks" in lower esophagus. No PPI currently as she ran out of pantoprazole a few months ago. Got back on pantoprazole with some improvement. GERD for many months. Dysphagia as well.   Constipation: taking a stool softener just as needed. Needs a prescription for Miralax. Has a BM once every 2-3 days. Was helping when taking Miralax every day. Amitiza 8 mcg po BID.     Past Medical History:  Diagnosis Date   Acid reflux disease    Anemia    Anxiety    Asthma    Breast cancer ([facility]    left side   Chronic back pain    Chronic kidney disease    [place]    Diverticulitis    Hypertension     Lumbosacral spondylosis    Per previous PCP records   Sleep apnea    Thyroid disease     Past Surgical History:  Procedure Laterality Date   ABDOMINAL HYSTERECTOMY     BACK SURGERY     BIOPSY  02/19/2020   Procedure: BIOPSY;  Surgeon: CEloise Harman DO;  [facility];  Service: Endoscopy;;   BREAST LUMPECTOMY Left 2003   BREAST SURGERY     COLONOSCOPY WITH PROPOFOL N/A 02/19/2020   non-bleeding internal hemorrhoids, many small-mouthed diverticula in entire colon.   ESOPHAGOGASTRODUODENOSCOPY (EGD) WITH PROPOFOL N/A 02/19/2020    benign-appearing esophageal stenosis s/p dilation, gastritis s/p biopsy, normal duodenum. Negative H.pylori, negative duodenal biopsy.    GIVENS CAPSULE STUDY N/A 06/14/2020   Procedure: GIVENS CAPSULE STUDY;  Surgeon: CEloise Harman DO;  Location: [facility];  Service: Endoscopy;  Laterality: N/A;  7:30am   THYROIDECTOMY     tummy tuck     at time of breast surgery 2003    Current Outpatient Medications  Medication Sig Dispense Refill   acetaminophen (TYLENOL) 325 MG tablet Take 650 mg by mouth every 4 (four) hours as needed.     amitriptyline (ELAVIL) 25 MG tablet Take 25 mg by mouth at bedtime.     Azelastine HCl 0.15 % SOLN Place 1 spray into both nostrils daily.     docusate sodium (COLACE) 100 MG capsule Take 1 capsule (100 mg total) by mouth  2 (two) times daily. 60 capsule 6   fluticasone (FLONASE) 50 MCG/ACT nasal spray Place 2 sprays into both nostrils daily.     Fluticasone Furoate (ARNUITY ELLIPTA) 200 MCG/ACT AEPB Inhale 1 puff into the lungs daily.     furosemide (LASIX) 20 MG tablet Take 1 tablet (20 mg total) by mouth every other day. Takes 40 mg every other day. On alternate days take 80 mg     glipiZIDE (GLUCOTROL XL) 5 MG 24 hr tablet Take 1 tablet (5 mg total) by mouth daily with breakfast. 90 tablet 3   insulin degludec (TRESIBA FLEXTOUCH) 100 UNIT/ML FlexTouch Pen Inject 20 Units into the skin at bedtime. 15 mL  3   losartan (COZAAR) 100 MG tablet Take 100 mg by mouth daily.     lubiprostone (AMITIZA) 8 MCG capsule Take 8 mcg by mouth 2 (two) times daily with a meal.     meclizine (ANTIVERT) 25 MG tablet Take 25 mg by mouth 3 (three) times daily as needed for dizziness.     methocarbamol (ROBAXIN) 500 MG tablet Take 1 tablet (500 mg total) by mouth every 6 (six) hours as needed for muscle spasms. 30 tablet 1   mirabegron ER (MYRBETRIQ) 50 MG TB24 tablet Take 1 tablet (50 mg total) by mouth daily. 90 tablet 3   ondansetron (ZOFRAN-ODT) 4 MG disintegrating tablet Take 4 mg by mouth every 8 (eight) hours as needed.     pantoprazole (PROTONIX) 40 MG tablet Take 1 tablet (40 mg total) by mouth daily. 30 minutes before breakfast 90 tablet 3   polyethylene glycol (MIRALAX / GLYCOLAX) 17 g packet Take 17 g by mouth daily as needed for mild constipation. 14 each 0   polyethylene glycol (MIRALAX) 17 g packet Take 17 g by mouth daily. 90 packet 0   pregabalin (LYRICA) 50 MG capsule Take 1 capsule (50 mg total) by mouth at bedtime. 30 capsule 2   sucralfate (CARAFATE) 1 g tablet Take 1 tablet (1 g total) by mouth 3 (three) times daily. 701 tablet 4   TRULICITY 3 XB/9.3JQ SOPN SMARTSIG:3 Milligram(s) SUB-Q Once a Week 6 mL 1   No current facility-administered medications for this visit.    Allergies as of 02/07/2022 - Review Complete 02/07/2022  Allergen Reaction Noted   Codeine Nausea And Vomiting and Anaphylaxis 12/18/2019   Carvedilol  09/16/2021   Hydrochlorothiazide Other (See Comments) 12/18/2019   Lisinopril Nausea And Vomiting 12/18/2019   Oxybutynin Other (See Comments) 12/18/2019   Penicillins Hives 01/05/2020    Family History  Problem Relation Age of Onset   Diabetes Mother    Thyroid disease Mother    Colon cancer Neg Hx     Social History   Socioeconomic History   Marital status: Divorced    Spouse name: Not on file   Number of children: Not on file   Years of education: Not on file    Highest education level: Not on file  Occupational History   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 [place]  Tobacco Use   Smoking status: Never   Smokeless tobacco: Never  Vaping Use   Vaping Use: Never used  Substance and Sexual Activity   Alcohol use: Not Currently   Drug use: Not Currently   Sexual activity: Not on file  Other Topics Concern   Not on file  Social History Narrative   Not on file   Social Determinants of Radio broadcast assistant  Strain: Not on file  Food Insecurity: Not on file  Transportation Needs: Not on file  Physical Activity: Not on file  Stress: Not on file  Social Connections: Not on file  Intimate Partner Violence: Not on file     Review of Systems   Gen: Denies any fever, chills, fatigue, weight loss, lack of appetite.  CV: Denies chest pain, heart palpitations, peripheral edema, syncope.  Resp: Denies shortness of breath at rest or with exertion. Denies wheezing or cough.  GI:see HPI GU : Denies urinary burning, urinary frequency, urinary hesitancy MS: Denies joint pain, muscle weakness, cramps, or limitation of movement.  Derm: Denies rash, itching, dry skin Psych: Denies depression, anxiety, memory loss, and confusion Heme: Denies bruising, bleeding, and enlarged lymph nodes.   Physical Exam   BP (!) 174/82   Pulse 93   Temp (!) 97.4 F (36.3 C)   Ht '5\' 3"'$  (1.6 m)   Wt 181 lb (82.1 kg)   BMI 32.06 kg/m  General:   Alert and oriented. Pleasant and cooperative. Well-nourished and well-developed.  Head:  Normocephalic and atraumatic. Eyes:  Without icterus Abdomen:  +BS, soft, non-tender and non-distended. No HSM noted. No guarding or rebound. No masses appreciated.  Rectal:  Deferred  Msk:  Symmetrical without gross deformities. Normal posture. Extremities:  Without edema. Neurologic:  Alert and  oriented x4;  grossly normal neurologically. Skin:  Intact without significant lesions or rashes. Psych:   Alert and cooperative. Normal mood and affect.  Lab Results  Component Value Date   WBC 6.5 08/02/2021   HGB 13.2 08/02/2021   HCT 40.9 08/02/2021   MCV 85.7 08/02/2021   PLT 268 08/02/2021   Lab Results  Component Value Date   IRON 70 10/27/2020   TIBC 425 01/28/2020   FERRITIN 207 10/27/2020     Assessment   Stephanie Moreno is a 76 y.o. female presenting today in follow-up with a history of 7abdominal pain and nausea, constipation, GERD,dysphagia s/p dilation in 2021, and IDA,  returning in follow-up.     Now noting dyspepsia and recurrent dysphagia. She is off PPI for several months as she ran out of the prescription. I suspect symptoms due to uncontrolled GERD/gastritis. However, she has noted improvement with dilation in the past. Will resume pantoprazole and pursue EGD/dilation.  Constipation: Amitiza 8 mcg po BID. Does well with Miralax prn. I have sent in prescription for this as well.   History of anemia: with component of IDA. Resolved. Colonoscopy/EGD on file from 2021, capsule on file as well.     PLAN    Proceed with upper endoscopy/dilation by Dr. Abbey Chatters in near future: the risks, benefits, and alternatives have been discussed with the patient in detail. The patient states understanding and desires to proceed.  Pantoprazole daily: prescription sent to pharmacy Amitiza 8 mg po BID and Miralax prn 3 month return   Annitta Needs, PhD, [facility]

## 2022-02-07 NOTE — Patient Instructions (Signed)
We are arranging an endoscopy with dilation by Dr. Abbey Chatters in the near future!  Please do not take glipizide the morning of the procedure. Please only take 1/2 dose of insulin the night prior.  I have sent in pantoprazole to take each morning 30 minutes before breakfast.  I have sent in Miralax to take daily as needed.   We will see you in 3 months!  I enjoyed seeing you again today! As you know, I value our relationship and want to provide genuine, compassionate, and quality care. I welcome your feedback. If you receive a survey regarding your visit,  I greatly appreciate you taking time to fill this out. See you next time!  Annitta Needs, PhD, ANP-BC [facility]

## 2022-02-07 NOTE — Progress Notes (Signed)
fol

## 2022-02-08 ENCOUNTER — Telehealth: Payer: Self-pay | Admitting: *Deleted

## 2022-02-08 ENCOUNTER — Encounter: Payer: Self-pay | Admitting: *Deleted

## 2022-02-08 NOTE — Telephone Encounter (Signed)
PA approved via [facility] website.  Authorization #: [id number], DOS: Mar 17, 2022 - Jun 15, 2022

## 2022-02-09 NOTE — Procedures (Signed)
Lumbosacral Transforaminal Epidural Steroid Injection - Sub-Pedicular Approach with Fluoroscopic Guidance  Patient: Stephanie Moreno      Date of Birth: [birth year] MRN: [id number] PCP: Margretta Sidle, MD      Visit Date: 01/31/2022   Universal Protocol:    Date/Time: 01/31/2022  Consent Given By: the patient  Position: PRONE  Additional Comments: Vital signs were monitored before and after the procedure. Patient was prepped and draped in the usual sterile fashion. The correct patient, procedure, and site was verified.   Injection Procedure Details:   Procedure diagnoses: Lumbar radiculopathy [M54.16]    Meds Administered:  Meds ordered this encounter  Medications   methylPREDNISolone acetate (DEPO-MEDROL) injection 80 mg    Laterality: Left  Location/Site: L4  Needle:5.0 in., 22 ga.  Short bevel or Quincke spinal needle  Needle Placement: Transforaminal  Findings:    -Comments: Excellent flow of contrast along the nerve, nerve root and into the epidural space.  Procedure Details: After squaring off the end-plates to get a true AP view, the C-arm was positioned so that an oblique view of the foramen as noted above was visualized. The target area is just inferior to the "nose of the scotty dog" or sub pedicular. The soft tissues overlying this structure were infiltrated with 2-3 ml. of 1% Lidocaine without Epinephrine.  The spinal needle was inserted toward the target using a "trajectory" view along the fluoroscope beam.  Under AP and lateral visualization, the needle was advanced so it did not puncture dura and was located close the 6 O'Clock position of the pedical in AP tracterory. Biplanar projections were used to confirm position. Aspiration was confirmed to be negative for CSF and/or blood. A 1-2 ml. volume of Isovue-250 was injected and flow of contrast was noted at each level. Radiographs were obtained for documentation purposes.   After attaining the desired flow of  contrast documented above, a 0.5 to 1.0 ml test dose of 0.25% Marcaine was injected into each respective transforaminal space.  The patient was observed for 90 seconds post injection.  After no sensory deficits were reported, and normal lower extremity motor function was noted,   the above injectate was administered so that equal amounts of the injectate were placed at each foramen (level) into the transforaminal epidural space.   Additional Comments:  No complications occurred Dressing: 2 x 2 sterile gauze and Band-Aid    Post-procedure details: Patient was observed during the procedure. Post-procedure instructions were reviewed.  Patient left the clinic in stable condition.

## 2022-02-09 NOTE — Progress Notes (Signed)
Stephanie Moreno - 76 y.o. female MRN [id number]  Date of birth: [birth year]  Office Visit Note: Visit Date: 01/31/2022 PCP: Margretta Sidle, MD Referred by: Margretta Sidle, MD  Subjective: Chief Complaint  Patient presents with   Lower Back - Pain   Left Leg - Pain   HPI:  Stephanie Moreno is a 76 y.o. female who comes in today at the request of Dr. Basil Dess for planned Left L4-5 Lumbar Transforaminal epidural steroid injection with fluoroscopic guidance.  The patient has failed conservative care including home exercise, medications, time and activity modification.  This injection will be diagnostic and hopefully therapeutic.  Please see requesting physician notes for further details and justification.   ROS Otherwise per HPI.  Assessment & Plan: Visit Diagnoses:    ICD-10-CM   1. Lumbar radiculopathy  M54.16 XR C-ARM NO REPORT    Epidural Steroid injection    methylPREDNISolone acetate (DEPO-MEDROL) injection 80 mg      Plan: No additional findings.   Meds & Orders:  Meds ordered this encounter  Medications   methylPREDNISolone acetate (DEPO-MEDROL) injection 80 mg    Orders Placed This Encounter  Procedures   XR C-ARM NO REPORT   Epidural Steroid injection    Follow-up: Return for visit to requesting provider as needed.   Procedures: No procedures performed  Lumbosacral Transforaminal Epidural Steroid Injection - Sub-Pedicular Approach with Fluoroscopic Guidance  Patient: Stephanie Moreno      Date of Birth: 03-18-1946 MRN: [id number] PCP: Margretta Sidle, MD      Visit Date: 01/31/2022   Universal Protocol:    Date/Time: 01/31/2022  Consent Given By: the patient  Position: PRONE  Additional Comments: Vital signs were monitored before and after the procedure. Patient was prepped and draped in the usual sterile fashion. The correct patient, procedure, and site was verified.   Injection Procedure Details:   Procedure diagnoses: Lumbar radiculopathy [M54.16]     Meds Administered:  Meds ordered this encounter  Medications   methylPREDNISolone acetate (DEPO-MEDROL) injection 80 mg    Laterality: Left  Location/Site: L4  Needle:5.0 in., 22 ga.  Short bevel or Quincke spinal needle  Needle Placement: Transforaminal  Findings:    -Comments: Excellent flow of contrast along the nerve, nerve root and into the epidural space.  Procedure Details: After squaring off the end-plates to get a true AP view, the C-arm was positioned so that an oblique view of the foramen as noted above was visualized. The target area is just inferior to the "nose of the scotty dog" or sub pedicular. The soft tissues overlying this structure were infiltrated with 2-3 ml. of 1% Lidocaine without Epinephrine.  The spinal needle was inserted toward the target using a "trajectory" view along the fluoroscope beam.  Under AP and lateral visualization, the needle was advanced so it did not puncture dura and was located close the 6 O'Clock position of the pedical in AP tracterory. Biplanar projections were used to confirm position. Aspiration was confirmed to be negative for CSF and/or blood. A 1-2 ml. volume of Isovue-250 was injected and flow of contrast was noted at each level. Radiographs were obtained for documentation purposes.   After attaining the desired flow of contrast documented above, a 0.5 to 1.0 ml test dose of 0.25% Marcaine was injected into each respective transforaminal space.  The patient was observed for 90 seconds post injection.  After no sensory deficits were reported, and normal lower extremity motor function was noted,  the above injectate was administered so that equal amounts of the injectate were placed at each foramen (level) into the transforaminal epidural space.   Additional Comments:  No complications occurred Dressing: 2 x 2 sterile gauze and Band-Aid    Post-procedure details: Patient was observed during the procedure. Post-procedure  instructions were reviewed.  Patient left the clinic in stable condition.    Clinical History: MRI LUMBAR SPINE WITHOUT AND WITH CONTRAST   TECHNIQUE: Multiplanar and multiecho pulse sequences of the lumbar spine were obtained without and with intravenous contrast.   CONTRAST:  36m MULTIHANCE GADOBENATE DIMEGLUMINE 529 MG/ML IV SOLN   COMPARISON:  Lumbar MRI 10/22/2020   FINDINGS: Segmentation:  5 lumbar vertebra   Alignment:  Normal   Vertebrae: Normal bone marrow. Negative for fracture or mass. No evidence of spinal infection.   Conus medullaris and cauda equina: Conus extends to the L2-3 level. Conus and cauda equina appear normal.   Paraspinal and other soft tissues: Bilateral renal cysts. No paraspinous mass, adenopathy, or fluid collection   Disc levels:   L1-2: Negative   L2-3: Negative   L3-4: Mild facet degeneration. Negative for disc protrusion or stenosis   L4-5: Shallow central disc protrusion unchanged. Bilateral facet hypertrophy. Mild spinal stenosis improved likely due to laminotomy. Mild subarticular stenosis bilaterally.   L5-S1: Pedicle screw and interbody fusion. Negative for spinal or foraminal stenosis.   IMPRESSION: 1. 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 Interval pedicle screw and interbody fusion L5-S1. Negative for stenosis.     Electronically Signed   By: CFranchot GalloM.D.   On: 10/11/2021 14:03     Objective:  VS:  HT:    WT:   BMI:     BP:(!) 178/89  HR:91bpm  TEMP: ( )  RESP:  Physical Exam Vitals and nursing note reviewed.  Constitutional:      General: She is not in acute distress.    Appearance: Normal appearance. She is not ill-appearing.  HENT:     Head: Normocephalic and atraumatic.     Right Ear: External ear normal.     Left Ear: External ear normal.  Eyes:     Extraocular Movements: Extraocular movements intact.  Cardiovascular:     Rate and Rhythm: Normal rate.      Pulses: Normal pulses.  Pulmonary:     Effort: Pulmonary effort is normal. No respiratory distress.  Abdominal:     General: There is no distension.     Palpations: Abdomen is soft.  Musculoskeletal:        General: Tenderness present.     Cervical back: Neck supple.     Right lower leg: No edema.     Left lower leg: No edema.     Comments: Patient has good distal strength with no pain over the greater trochanters.  No clonus or focal weakness.  Skin:    Findings: No erythema, lesion or rash.  Neurological:     General: No focal deficit present.     Mental Status: She is alert and oriented to person, place, and time.     Sensory: No sensory deficit.     Motor: No weakness or abnormal muscle tone.     Coordination: Coordination normal.  Psychiatric:        Mood and Affect: Mood normal.        Behavior: Behavior normal.      Imaging: No results found.

## 2022-03-13 ENCOUNTER — Ambulatory Visit
Admission: EM | Admit: 2022-03-13 | Discharge: 2022-03-13 | Disposition: A | Discharge status: Home / Self Care | Payer: Medicare Other | Attending: Nurse Practitioner | Admitting: Nurse Practitioner

## 2022-03-13 ENCOUNTER — Telehealth: Payer: Self-pay | Admitting: Nurse Practitioner

## 2022-03-13 DIAGNOSIS — E1165 Type 2 diabetes mellitus with hyperglycemia: Secondary | ICD-10-CM | POA: Diagnosis not present

## 2022-03-13 DIAGNOSIS — R739 Hyperglycemia, unspecified: Secondary | ICD-10-CM

## 2022-03-13 LAB — POCT URINALYSIS DIP (MANUAL ENTRY)
Bilirubin, UA: NEGATIVE
Glucose, UA: NEGATIVE mg/dL
Ketones, POC UA: NEGATIVE mg/dL
Leukocytes, UA: NEGATIVE
Nitrite, UA: NEGATIVE
Protein Ur, POC: 100 mg/dL — AB
Spec Grav, UA: <=1.005 — AB (ref 1.010–1.025)
Urobilinogen, UA: 0.2 E.U./dL
pH, UA: 7 (ref 5.0–8.0)

## 2022-03-13 LAB — POCT FASTING CBG KUC MANUAL ENTRY: POCT Glucose (KUC): 262 mg/dL — AB (ref 70–99)

## 2022-03-13 NOTE — Patient Instructions (Addendum)
Stephanie Moreno  03/13/2022     '@PREFPERIOPPHARMACY'$ @   Your procedure is scheduled on  03/17/2022.   Report to [facility] at  [place].   Call this number if you have problems the morning of surgery:  [phone]      Your last dose of trulicity should have been 9/8 or before.   Remember:  Follow the diet instructions given to you by the office.       Take 10 units of your night time insulin the night before your procedure.       DO NOT take any medications for diabetes the morning of your procedure.      Use your inhaler before you come and bring your rescue inhaler with you.     Take these medicines the morning of surgery with A SIP OF WATER        antivert(if needed), robaxin (if needed), myrbetriq, zorfan (if needed), protonix.     Do not wear jewelry, make-up or nail polish.  Do not wear lotions, powders, or perfumes, or deodorant.  Do not shave 48 hours prior to surgery.  Men may shave face and neck.  Do not bring valuables to the hospital.  [facility] is not responsible for any belongings or valuables.  Contacts, dentures or bridgework may not be worn into surgery.  Leave your suitcase in the car.  After surgery it may be brought to your room.  For patients admitted to the hospital, discharge time will be determined by your treatment team.  Patients discharged the day of surgery will not be allowed to drive home and must have someone with them for 24 hours.    Special instructions:   DO NOT smoke tobacco or vape for 24 hours before your procedure.  Please read over the following fact sheets that you were given. Anesthesia Post-op Instructions and Care and Recovery After Surgery       Upper Endoscopy, Adult, Care After After the procedure, it is common to have a sore throat. It is also common to have: Mild stomach pain or discomfort. Bloating. Nausea. Follow these instructions at home: The instructions below may help you care for yourself  at home. Your health care provider may give you more instructions. If you have questions, ask your health care provider. If you were given a sedative during the procedure, it can affect you for several hours. Do not drive or operate machinery until your health care provider says that it is safe. If you will be going home right after the procedure, plan to have a responsible adult: Take you home from the hospital or clinic. You will not be allowed to drive. Care for you for the time you are told. Follow instructions from your health care provider about what you may eat and drink. Return to your normal activities as told by your health care provider. Ask your health care provider what activities are safe for you. Take over-the-counter and prescription medicines only as told by your health care provider. Contact a health care provider if you: Have a sore throat that lasts longer than one day. Have trouble swallowing. Have a fever. Get help right away if you: Vomit blood or your vomit looks like coffee grounds. Have bloody, black, or tarry stools. Have a very bad sore throat or you cannot swallow. Have difficulty breathing or very bad pain in your chest or abdomen. These symptoms may be an emergency. Get help  right away. Call 911. Do not wait to see if the symptoms will go away. Do not drive yourself to the hospital. Summary After the procedure, it is common to have a sore throat, mild stomach discomfort, bloating, and nausea. If you were given a sedative during the procedure, it can affect you for several hours. Do not drive until your health care provider says that it is safe. Follow instructions from your health care provider about what you may eat and drink. Return to your normal activities as told by your health care provider. This information is not intended to replace advice given to you by your health care provider. Make sure you discuss any questions you have with your health care  provider. Document Revised: 09/28/2021 Document Reviewed: 09/28/2021 Elsevier Patient Education  [place]. Esophageal Dilatation Esophageal dilatation, also called esophageal dilation, is a procedure to widen or open a blocked or narrowed part of the esophagus. The esophagus is the part of the body that moves food and liquid from the mouth to the stomach. You may need this procedure if: You have a buildup of scar tissue in your esophagus that makes it difficult, painful, or impossible to swallow. This can be caused by gastroesophageal reflux disease (GERD). You have cancer of the esophagus. There is a problem with how food moves through your esophagus. In some cases, you may need this procedure repeated at a later time to dilate the esophagus gradually. Tell a health care provider about: Any allergies you have. All medicines you are taking, including vitamins, herbs, eye drops, creams, and over-the-counter medicines. Any problems you or family members have had with anesthetic medicines. Any blood disorders you have. Any surgeries you have had. Any medical conditions you have. Any antibiotic medicines you are required to take before dental procedures. Whether you are pregnant or may be pregnant. What are the risks? Generally, this is a safe procedure. However, problems may occur, including: Bleeding due to a tear in the lining of the esophagus. A hole, or perforation, in the esophagus. What happens before the procedure? Ask your health care provider about: Changing or stopping your regular medicines. This is especially important if you are taking diabetes medicines or blood thinners. Taking medicines such as aspirin and ibuprofen. These medicines can thin your blood. Do not take these medicines unless your health care provider tells you to take them. Taking over-the-counter medicines, vitamins, herbs, and supplements. Follow instructions from your health care provider about eating  or drinking restrictions. Plan to have a responsible adult take you home from the hospital or clinic. Plan to have a responsible adult care for you for the time you are told after you leave the hospital or clinic. This is important. What happens during the procedure? You may be given a medicine to help you relax (sedative). A numbing medicine may be sprayed into the back of your throat, or you may gargle the medicine. Your health care provider may perform the dilatation using various surgical instruments, such as: Simple dilators. This instrument is carefully placed in the esophagus to stretch it. Guided wire bougies. This involves using an endoscope to insert a wire into the esophagus. A dilator is passed over this wire to enlarge the esophagus. Then the wire is removed. Balloon dilators. An endoscope with a small balloon is inserted into the esophagus. The balloon is inflated to stretch the esophagus and open it up. The procedure may vary among health care providers and hospitals. What can I expect after  the procedure? Your blood pressure, heart rate, breathing rate, and blood oxygen level will be monitored until you leave the hospital or clinic. Your throat may feel slightly sore and numb. This will get better over time. You will not be allowed to eat or drink until your throat is no longer numb. When you are able to drink, urinate, and sit on the edge of the bed without nausea or dizziness, you may be able to return home. Follow these instructions at home: Take over-the-counter and prescription medicines only as told by your health care provider. If you were given a sedative during the procedure, it can affect you for several hours. Do not drive or operate machinery until your health care provider says that it is safe. Plan to have a responsible adult care for you for the time you are told. This is important. Follow instructions from your health care provider about any eating or drinking  restrictions. Do not use any products that contain nicotine or tobacco, such as cigarettes, e-cigarettes, and chewing tobacco. If you need help quitting, ask your health care provider. Keep all follow-up visits. This is important. Contact a health care provider if: You have a fever. You have pain that is not relieved by medicine. Get help right away if: You have chest pain. You have trouble breathing. You have trouble swallowing. You vomit blood. You have black, tarry, or bloody stools. These symptoms may represent a serious problem that is an emergency. Do not wait to see if the symptoms will go away. Get medical help right away. Call your local emergency services (911 in the [place] Do not drive yourself to the hospital. Summary Esophageal dilatation, also called esophageal dilation, is a procedure to widen or open a blocked or narrowed part of the esophagus. Plan to have a responsible adult take you home from the hospital or clinic. For this procedure, a numbing medicine may be sprayed into the back of your throat, or you may gargle the medicine. Do not drive or operate machinery until your health care provider says that it is safe. This information is not intended to replace advice given to you by your health care provider. Make sure you discuss any questions you have with your health care provider. Document Revised: 11/05/2019 Document Reviewed: 11/05/2019 Elsevier Patient Education  [place] This sheet gives you information about how to care for yourself after your procedure. Your health care provider may also give you more specific instructions. If you have problems or questions, contact your health care provider. What can I expect after the procedure? After the procedure, it is common to have: Tiredness. Forgetfulness about what happened after the procedure. Impaired judgment for important decisions. Nausea or vomiting. Some  difficulty with balance. Follow these instructions at home: For the time period you were told by your health care provider:     Rest as needed. Do not participate in activities where you could fall or become injured. Do not drive or use machinery. Do not drink alcohol. Do not take sleeping pills or medicines that cause drowsiness. Do not make important decisions or sign legal documents. Do not take care of children on your own. Eating and drinking Follow the diet that is recommended by your health care provider. Drink enough fluid to keep your urine pale yellow. If you vomit: Drink water, juice, or soup when you can drink without vomiting. Make sure you have little or no nausea before eating solid foods. General  instructions Have a responsible adult stay with you for the time you are told. It is important to have someone help care for you until you are awake and alert. Take over-the-counter and prescription medicines only as told by your health care provider. If you have sleep apnea, surgery and certain medicines can increase your risk for breathing problems. Follow instructions from your health care provider about wearing your sleep device: Anytime you are sleeping, including during daytime naps. While taking prescription pain medicines, sleeping medicines, or medicines that make you drowsy. Avoid smoking. Keep all follow-up visits as told by your health care provider. This is important. Contact a health care provider if: You keep feeling nauseous or you keep vomiting. You feel light-headed. You are still sleepy or having trouble with balance after 24 hours. You develop a rash. You have a fever. You have redness or swelling around the IV site. Get help right away if: You have trouble breathing. You have new-onset confusion at home. Summary For several hours after your procedure, you may feel tired. You may also be forgetful and have poor judgment. Have a responsible adult stay  with you for the time you are told. It is important to have someone help care for you until you are awake and alert. Rest as told. Do not drive or operate machinery. Do not drink alcohol or take sleeping pills. Get help right away if you have trouble breathing, or if you suddenly become confused. This information is not intended to replace advice given to you by your health care provider. Make sure you discuss any questions you have with your health care provider. Document Revised: 05/24/2021 Document Reviewed: 05/22/2019 Elsevier Patient Education  [place].

## 2022-03-13 NOTE — Telephone Encounter (Signed)
New message    Patient calling trouble with blood sugar   9/8 in the morning 208  9/8 in the evening @ 2pm 286  9/8 around 4 pm 343  9/9 in the morning @ 10:27am 248  9/9 @ 12:22pm 227  9/9 @ 8:03pm 369   9/10 @ 10:42am 229  9/10 @ 12:00pm 314  9/10 @ 5:00pm 254  9/10 @ 7:00pm 288   9/11 @ 9:30 am 223  1. What are your last 5 BP readings? 9/8 142/67 83 pulse   2. Are you having any other symptoms (ex. Dizziness, headache, blurred vision, passed out)? No   3. What is your BP issue? PCP was contacted and prescribe Amlodipine 10 mg on Tuesday

## 2022-03-13 NOTE — Discharge Instructions (Addendum)
Your urinalysis suggest that you need to increase your water intake.  As discussed, try to drink at least 6-8 8 ounce glasses of water or is much as you can tolerate to help keep your blood glucose levels normal. Continue your regular medications at this time. Avoid Gatorade as this is loaded with sugar. Continue to check your blood sugars as recommended by your endocrinologist. Monitor your dietary intake, try to avoid sugars that are high in sugar and fat. Follow-up with your endocrinologist office for reevaluation within the next 3 to 5 days. Go to the emergency department if your blood sugars exceed 500, or if you develop fevers, chills, abdominal pain, chest pain, or other concerns.

## 2022-03-13 NOTE — Telephone Encounter (Signed)
Pt states she has beenout of her glipizide '5mg'$  for 3 days, she has been taking tresiba 20 units at bedtime and trulicity '3mg'$  weekly.

## 2022-03-13 NOTE — ED Triage Notes (Signed)
Pt reports her CBG has been elevated for past 4 days, also reports headache

## 2022-03-13 NOTE — ED Provider Notes (Signed)
[facility]    CSN: [id number] Arrival date & time: 03/13/22  1139      History   Chief Complaint Chief Complaint  Patient presents with   Hyperglycemia    HPI Stephanie Moreno is a 76 y.o. female.   The history is provided by the patient.   Patient presents with a 4-day history of elevated blood glucose levels.  She states blood sugars have been reading "high" at home and in the high 300s.  She complains of increased urination.  She denies fever, chills, chest pain, abdominal pain, nausea, vomiting, diarrhea, polydipsia, or polyphagia.  Patient states today that her blood sugar read high she had [place], a biscuit, mint candy, and Gatorade.  She states that she has also noticed that her vision has been blurry when her blood sugar was elevated.  She states this morning her blood sugar was in the 220s.  She states she has not eaten anything today.  Patient states that she did contact the endocrinologist, but she was not sure when the nurse would call her back so she came to urgent care.  Patient's most recent A1c was 8.7, which had increased from March his A1c of 7.8.  Past Medical History:  Diagnosis Date   Acid reflux disease    Anemia    Anxiety    Asthma    Breast cancer ([place])    left side   Chronic back pain    Chronic kidney disease    Diabetes mellitus without complication ([place])    Diverticulitis    Hypertension    Lumbosacral spondylosis    Per previous PCP records   Sleep apnea    Thyroid disease     Patient Active Problem List   Diagnosis Date Noted   Urge incontinence 07/26/2021   Urinary retention 04/15/2021   Acute kidney injury ([place]) 04/12/2021   Acute renal failure superimposed on chronic kidney disease, unspecified CKD stage, unspecified acute renal failure type ([place]) 04/12/2021   Acute renal failure superimposed on stage 3b chronic kidney disease ([place]) 04/12/2021   Dehydration    Spondylolisthesis, lumbar region    Spinal stenosis, lumbar  region, with neurogenic claudication    Dysphagia 03/18/2021   Speech disturbance 03/18/2021   Wheezing 03/18/2021   Ileus ([place]) 03/18/2021   Fusion of spine of lumbar region 03/15/2021   Cerebrovascular disease 12/20/2020   GERD (gastroesophageal reflux disease) 11/18/2020   Type 2 diabetes mellitus with stage 3b chronic kidney disease, without long-term current use of insulin ([place]) 07/12/2020   Mixed hyperlipidemia 07/12/2020   Essential hypertension, benign 07/12/2020   Iron deficiency anemia    Loss of weight 04/09/2020   Dyspepsia 01/28/2020   Constipation 01/28/2020   Lower abdominal pain 01/28/2020   Normocytic anemia 01/28/2020    Past Surgical History:  Procedure Laterality Date   ABDOMINAL HYSTERECTOMY     BACK SURGERY     BIOPSY  02/19/2020   Procedure: BIOPSY;  Surgeon: Eloise Harman, DO;  [facility]: [facility];  Service: Endoscopy;;   BREAST LUMPECTOMY Left 2003   BREAST SURGERY     COLONOSCOPY WITH PROPOFOL N/A 02/19/2020   non-bleeding internal hemorrhoids, many small-mouthed diverticula in entire colon.   ESOPHAGOGASTRODUODENOSCOPY (EGD) WITH PROPOFOL N/A 02/19/2020    benign-appearing esophageal stenosis s/p dilation, gastritis s/p biopsy, normal duodenum. Negative H.pylori, negative duodenal biopsy.    GIVENS CAPSULE STUDY N/A 06/14/2020   Procedure: GIVENS CAPSULE STUDY;  Surgeon: Eloise Harman, DO;  Location:  [facility];  Service: Endoscopy;  Laterality: N/A;  7:30am   THYROIDECTOMY     tummy tuck     at time of breast surgery 2003    OB History   No obstetric history on file.      Home Medications    Prior to Admission medications   Medication Sig Start Date End Date Taking? Authorizing Provider  acetaminophen (TYLENOL) 325 MG tablet Take 650 mg by mouth every 4 (four) hours as needed.    [provider]  amitriptyline (ELAVIL) 25 MG tablet Take 25 mg by mouth at bedtime. 10/20/20   [provider]  Azelastine HCl  0.15 % SOLN Place 1 spray into both nostrils daily. 08/19/21   [provider]  docusate sodium (COLACE) 100 MG capsule Take 1 capsule (100 mg total) by mouth 2 (two) times daily. 01/04/22   Jessy Oto, MD  fluticasone (FLONASE) 50 MCG/ACT nasal spray Place 2 sprays into both nostrils daily.    [provider]  Fluticasone Furoate (ARNUITY ELLIPTA) 200 MCG/ACT AEPB Inhale 1 puff into the lungs daily.    [provider]  furosemide (LASIX) 20 MG tablet Take 1 tablet (20 mg total) by mouth every other day. Takes 40 mg every other day. On alternate days take 80 mg 04/18/21   Johnson, Clanford L, MD  glipiZIDE (GLUCOTROL XL) 5 MG 24 hr tablet Take 1 tablet (5 mg total) by mouth daily with breakfast. 09/16/21   Brita Romp, NP  insulin degludec (TRESIBA FLEXTOUCH) 100 UNIT/ML FlexTouch Pen Inject 20 Units into the skin at bedtime. 05/04/21   Brita Romp, NP  losartan (COZAAR) 100 MG tablet Take 100 mg by mouth daily. 04/16/21   [provider]  lubiprostone (AMITIZA) 8 MCG capsule Take 8 mcg by mouth 2 (two) times daily with a meal.    [provider]  meclizine (ANTIVERT) 25 MG tablet Take 25 mg by mouth 3 (three) times daily as needed for dizziness.    [provider]  methocarbamol (ROBAXIN) 500 MG tablet Take 1 tablet (500 mg total) by mouth every 6 (six) hours as needed for muscle spasms. 03/19/21   Jessy Oto, MD  mirabegron ER (MYRBETRIQ) 50 MG TB24 tablet Take 1 tablet (50 mg total) by mouth daily. 08/23/21   Stoneking, Reece Leader., MD  ondansetron (ZOFRAN-ODT) 4 MG disintegrating tablet Take 4 mg by mouth every 8 (eight) hours as needed. 08/02/21   [provider]  pantoprazole (PROTONIX) 40 MG tablet Take 1 tablet (40 mg total) by mouth daily. 30 minutes before breakfast 02/07/22   Annitta Needs, NP  polyethylene glycol (MIRALAX / GLYCOLAX) 17 g packet Take 17 g by mouth daily as needed for mild constipation. 04/15/21    Johnson, Clanford L, MD  polyethylene glycol (MIRALAX) 17 g packet Take 17 g by mouth daily. 02/07/22 05/08/22  Annitta Needs, NP  pregabalin (LYRICA) 50 MG capsule Take 1 capsule (50 mg total) by mouth at bedtime. 09/19/21   Jessy Oto, MD  sucralfate (CARAFATE) 1 g tablet Take 1 tablet (1 g total) by mouth 3 (three) times daily. 01/04/22   Jessy Oto, MD  TRULICITY 3 OZ/3.0QM SOPN SMARTSIG:3 Milligram(s) SUB-Q Once a Week 10/31/21   Brita Romp, NP    Family History Family History  Problem Relation Age of Onset   Diabetes Mother    Thyroid disease Mother    Colon cancer Neg Hx  Social History Social History   Tobacco Use   Smoking status: Never   Smokeless tobacco: Never  Vaping Use   Vaping Use: Never used  Substance Use Topics   Alcohol use: Not Currently   Drug use: Not Currently     Allergies   Codeine, Carvedilol, Hydrochlorothiazide, Lisinopril, Oxybutynin, and Penicillins   Review of Systems Review of Systems Per HPI  Physical Exam Triage Vital Signs ED Triage Vitals  Enc Vitals Group     BP 03/13/22 1302 (!) 152/80     Pulse Rate 03/13/22 1302 72     Resp 03/13/22 1302 18     Temp 03/13/22 1302 98 F (36.7 C)     Temp src --      SpO2 03/13/22 1302 95 %     Weight --      Height --      Head Circumference --      Peak Flow --      Pain Score 03/13/22 1301 6     Pain Loc --      Pain Edu? --      Excl. in [place]? --    No data found.  Updated Vital Signs BP (!) 152/80   Pulse 72   Temp 98 F (36.7 C)   Resp 18   SpO2 95%   Visual Acuity Right Eye Distance:   Left Eye Distance:   Bilateral Distance:    Right Eye Near:   Left Eye Near:    Bilateral Near:     Physical Exam Vitals reviewed.  Constitutional:      General: She is not in acute distress.    Appearance: Normal appearance. She is well-developed.  HENT:     Head: Normocephalic.     Mouth/Throat:     Mouth: Mucous membranes are moist.  Eyes:     Extraocular  Movements: Extraocular movements intact.     Conjunctiva/sclera: Conjunctivae normal.     Pupils: Pupils are equal, round, and reactive to light.  Cardiovascular:     Rate and Rhythm: Normal rate and regular rhythm.     Pulses: Normal pulses.     Heart sounds: Normal heart sounds.  Pulmonary:     Effort: Pulmonary effort is normal.     Breath sounds: Normal breath sounds.  Abdominal:     General: Bowel sounds are normal. There is no distension.     Palpations: Abdomen is soft.     Tenderness: There is no abdominal tenderness. There is no guarding or rebound.  Genitourinary:    Vagina: Normal. No vaginal discharge.  Musculoskeletal:     Cervical back: Normal range of motion.  Lymphadenopathy:     Cervical: No cervical adenopathy.  Skin:    General: Skin is warm and dry.     Findings: No erythema or rash.  Neurological:     General: No focal deficit present.     Mental Status: She is alert and oriented to person, place, and time.     Cranial Nerves: No cranial nerve deficit.  Psychiatric:        Mood and Affect: Mood normal.        Behavior: Behavior normal.      UC Treatments / Results  Labs (all labs ordered are listed, but only abnormal results are displayed) Labs Reviewed  POCT FASTING CBG [facility] MANUAL ENTRY - Abnormal; Notable for the following components:      Result Value   POCT Glucose ([facility]) 262 (*)  All other components within normal limits  POCT URINALYSIS DIP (MANUAL ENTRY) - Abnormal; Notable for the following components:   Spec Grav, UA <=1.005 (*)    Blood, UA trace-lysed (*)    Protein Ur, POC =100 (*)    All other components within normal limits    EKG   Radiology No results found.  Procedures Procedures (including critical care time)  Medications Ordered in UC Medications - No data to display  Initial Impression / Assessment and Plan / UC Course  I have reviewed the triage vital signs and the nursing notes.  Pertinent labs & imaging  results that were available during my care of the patient were reviewed by me and considered in my medical decision making (see chart for details).  Patient presents for elevated blood glucose levels over the past 4 days.  Today, in the clinic, her blood sugar is 262.  Patient states this is close to where she normally runs.  On exam, patient's vital signs are stable, although she is mildly hypertensive, she is in no acute distress.  Urinalysis does show symptoms of dehydration with an elevated specific gravity.  Discussion with patient regarding dietary intake to prevent hyperglycemia.  Patient was advised to cut out Gatorade as it has an increased amount of sugar.  Patient was advised to increase her water intake, recommended that she drink at least 6-8 8 ounce glasses of water or is much as tolerated while symptoms persist.  Patient was advised to follow-up with endocrinology within the next 3 to 5 days for reevaluation.  Patient given strict indications of when to go to the emergency department.  Patient verbalizes understanding.  All questions were answered. Final Clinical Impressions(s) / UC Diagnoses   Final diagnoses:  Hyperglycemia     Discharge Instructions      Your urinalysis suggest that you need to increase your water intake.  As discussed, try to drink at least 6-8 8 ounce glasses of water or is much as you can tolerate to help keep your blood glucose levels normal. Continue your regular medications at this time. Avoid Gatorade as this is loaded with sugar. Continue to check your blood sugars as recommended by your endocrinologist. Monitor your dietary intake, try to avoid sugars that are high in sugar and fat. Follow-up with your endocrinologist office for reevaluation within the next 3 to 5 days. Go to the emergency department if your blood sugars exceed 500, or if you develop fevers, chills, abdominal pain, chest pain, or other concerns.     ED Prescriptions   None     PDMP not reviewed this encounter.   Tish Men, NP 03/13/22 1411

## 2022-03-14 ENCOUNTER — Encounter (HOSPITAL_COMMUNITY)
Admission: RE | Admit: 2022-03-14 | Discharge: 2022-03-14 | Disposition: A | Discharge status: Home / Self Care | Payer: Medicare Other | Source: Ambulatory Visit | Attending: Internal Medicine | Admitting: Internal Medicine

## 2022-03-14 ENCOUNTER — Encounter (HOSPITAL_COMMUNITY): Payer: Self-pay

## 2022-03-14 VITALS — BP 137/66 | HR 83 | Temp 98.0°F | Resp 18 | Ht 63.0 in | Wt 181.0 lb

## 2022-03-14 DIAGNOSIS — E1122 Type 2 diabetes mellitus with diabetic chronic kidney disease: Secondary | ICD-10-CM | POA: Insufficient documentation

## 2022-03-14 DIAGNOSIS — Z01812 Encounter for preprocedural laboratory examination: Secondary | ICD-10-CM | POA: Insufficient documentation

## 2022-03-14 DIAGNOSIS — N1832 Chronic kidney disease, stage 3b: Secondary | ICD-10-CM | POA: Diagnosis not present

## 2022-03-14 DIAGNOSIS — N179 Acute kidney failure, unspecified: Secondary | ICD-10-CM | POA: Insufficient documentation

## 2022-03-14 HISTORY — DX: Depression, unspecified: F32.A

## 2022-03-14 LAB — BASIC METABOLIC PANEL WITH GFR
Anion gap: 8 (ref 5–15)
BUN: 12 mg/dL (ref 8–23)
CO2: 25 mmol/L (ref 22–32)
Calcium: 9 mg/dL (ref 8.9–10.3)
Chloride: 103 mmol/L (ref 98–111)
Creatinine, Ser: 1.25 mg/dL — ABNORMAL HIGH (ref 0.44–1.00)
GFR, Estimated: 45 mL/min — ABNORMAL LOW
Glucose, Bld: 277 mg/dL — ABNORMAL HIGH (ref 70–99)
Potassium: 3.2 mmol/L — ABNORMAL LOW (ref 3.5–5.1)
Sodium: 136 mmol/L (ref 135–145)

## 2022-03-15 NOTE — Telephone Encounter (Signed)
Discussed with pt. Understanding voiced.

## 2022-03-16 ENCOUNTER — Encounter (HOSPITAL_COMMUNITY): Payer: Self-pay | Admitting: Anesthesiology

## 2022-03-16 ENCOUNTER — Emergency Department (HOSPITAL_COMMUNITY): Payer: Medicare Other

## 2022-03-16 ENCOUNTER — Emergency Department (HOSPITAL_COMMUNITY)
Admission: EM | Admit: 2022-03-16 | Discharge: 2022-03-16 | Disposition: A | Discharge status: Home / Self Care | Payer: Medicare Other | Attending: Emergency Medicine | Admitting: Emergency Medicine

## 2022-03-16 ENCOUNTER — Encounter (HOSPITAL_COMMUNITY): Payer: Self-pay | Admitting: Emergency Medicine

## 2022-03-16 ENCOUNTER — Other Ambulatory Visit: Payer: Self-pay

## 2022-03-16 DIAGNOSIS — Z853 Personal history of malignant neoplasm of breast: Secondary | ICD-10-CM | POA: Insufficient documentation

## 2022-03-16 DIAGNOSIS — U071 COVID-19: Secondary | ICD-10-CM

## 2022-03-16 DIAGNOSIS — J45909 Unspecified asthma, uncomplicated: Secondary | ICD-10-CM | POA: Insufficient documentation

## 2022-03-16 DIAGNOSIS — E1022 Type 1 diabetes mellitus with diabetic chronic kidney disease: Secondary | ICD-10-CM | POA: Diagnosis not present

## 2022-03-16 DIAGNOSIS — R519 Headache, unspecified: Secondary | ICD-10-CM | POA: Diagnosis not present

## 2022-03-16 DIAGNOSIS — R9082 White matter disease, unspecified: Secondary | ICD-10-CM | POA: Diagnosis not present

## 2022-03-16 DIAGNOSIS — E039 Hypothyroidism, unspecified: Secondary | ICD-10-CM | POA: Insufficient documentation

## 2022-03-16 DIAGNOSIS — S0990XA Unspecified injury of head, initial encounter: Secondary | ICD-10-CM | POA: Insufficient documentation

## 2022-03-16 DIAGNOSIS — E876 Hypokalemia: Secondary | ICD-10-CM | POA: Diagnosis not present

## 2022-03-16 DIAGNOSIS — Z794 Long term (current) use of insulin: Secondary | ICD-10-CM | POA: Diagnosis not present

## 2022-03-16 DIAGNOSIS — N189 Chronic kidney disease, unspecified: Secondary | ICD-10-CM | POA: Insufficient documentation

## 2022-03-16 DIAGNOSIS — R509 Fever, unspecified: Secondary | ICD-10-CM | POA: Diagnosis present

## 2022-03-16 DIAGNOSIS — Z7984 Long term (current) use of oral hypoglycemic drugs: Secondary | ICD-10-CM | POA: Insufficient documentation

## 2022-03-16 LAB — URINALYSIS, ROUTINE W REFLEX MICROSCOPIC
Bilirubin Urine: NEGATIVE
Glucose, UA: 150 mg/dL — AB
Ketones, ur: NEGATIVE mg/dL
Leukocytes,Ua: NEGATIVE
Nitrite: NEGATIVE
Protein, ur: >=300 mg/dL — AB
Specific Gravity, Urine: 1.012 (ref 1.005–1.030)
pH: 6 (ref 5.0–8.0)

## 2022-03-16 LAB — BASIC METABOLIC PANEL
Anion gap: 8 (ref 5–15)
BUN: 16 mg/dL (ref 8–23)
CO2: 24 mmol/L (ref 22–32)
Calcium: 8.6 mg/dL — ABNORMAL LOW (ref 8.9–10.3)
Chloride: 101 mmol/L (ref 98–111)
Creatinine, Ser: 1.35 mg/dL — ABNORMAL HIGH (ref 0.44–1.00)
GFR, Estimated: 41 mL/min — ABNORMAL LOW (ref >60)
Glucose, Bld: 238 mg/dL — ABNORMAL HIGH (ref 70–99)
Potassium: 2.7 mmol/L — CL (ref 3.5–5.1)
Sodium: 133 mmol/L — ABNORMAL LOW (ref 135–145)

## 2022-03-16 LAB — MAGNESIUM: Magnesium: 1.6 mg/dL — ABNORMAL LOW (ref 1.7–2.4)

## 2022-03-16 LAB — CBC
HCT: 35.5 % — ABNORMAL LOW (ref 36.0–46.0)
Hemoglobin: 11.7 g/dL — ABNORMAL LOW (ref 12.0–15.0)
MCH: 28.1 pg (ref 26.0–34.0)
MCHC: 33 g/dL (ref 30.0–36.0)
MCV: 85.3 fL (ref 80.0–100.0)
Platelets: 222 10*3/uL (ref 150–400)
RBC: 4.16 MIL/uL (ref 3.87–5.11)
RDW: 12.7 % (ref 11.5–15.5)
WBC: 5.4 10*3/uL (ref 4.0–10.5)
nRBC: 0 % (ref 0.0–0.2)

## 2022-03-16 LAB — RESP PANEL BY RT-PCR (FLU A&B, COVID) ARPGX2
Influenza A by PCR: NEGATIVE
Influenza B by PCR: NEGATIVE
SARS Coronavirus 2 by RT PCR: POSITIVE — AB

## 2022-03-16 LAB — CBG MONITORING, ED: Glucose-Capillary: 282 mg/dL — ABNORMAL HIGH (ref 70–99)

## 2022-03-16 MED ORDER — POTASSIUM CHLORIDE CRYS ER 20 MEQ PO TBCR: 40.0000 meq | EXTENDED_RELEASE_TABLET | ORAL | 0 refills | Status: DC

## 2022-03-16 MED ORDER — MAGNESIUM SULFATE 2 GM/50ML IV SOLN
2.0000 g | Freq: Once | INTRAVENOUS | Status: AC
  Administered 2022-03-16: 2 g via INTRAVENOUS
  Filled 2022-03-16: qty 50

## 2022-03-16 MED ORDER — NIRMATRELVIR/RITONAVIR (PAXLOVID) TABLET (RENAL DOSING): 2.0000 | ORAL_TABLET | Freq: Two times a day (BID) | ORAL | 0 refills | Status: AC

## 2022-03-16 MED ORDER — POTASSIUM CHLORIDE CRYS ER 20 MEQ PO TBCR
60.0000 meq | EXTENDED_RELEASE_TABLET | ORAL | Status: AC
  Administered 2022-03-16 (×2): 60 meq via ORAL
  Filled 2022-03-16 (×2): qty 3

## 2022-03-16 MED ORDER — ACETAMINOPHEN 500 MG PO TABS
1000.0000 mg | ORAL_TABLET | ORAL | Status: AC
  Administered 2022-03-16: 1000 mg via ORAL
  Filled 2022-03-16: qty 2

## 2022-03-16 NOTE — ED Notes (Signed)
Went to use restroom but could not urinate; did have a bowel movement

## 2022-03-16 NOTE — ED Notes (Signed)
Lab stated they have labs the lab work , they will receive them right now and run them.

## 2022-03-16 NOTE — Discharge Instructions (Signed)
Today you were seen in the emergency department for your weakness and fall.    In the emergency department you you had labs that showed that your potassium and magnesium were low which were given to you and that you have COVID-19.    At home, please take Paxlovid to treat your COVID.  Do not take any medications for high cholesterol (statins) while on this medication.  You may resume them when you stop taking Paxlovid.  Please take the potassium we have prescribed you on Friday, Monday, and Wednesday.  Check your MyChart online for the results of any tests that had not resulted by the time you left the emergency department.   Follow-up with your primary doctor in 2-3 days regarding your visit.  Follow-up with GI tomorrow about your upcoming procedure.  Return immediately to the emergency department if you experience any of the following: Difficulty breathing, chest pain, or any other concerning symptoms.    Thank you for visiting our Emergency Department. It was a pleasure taking care of you today.

## 2022-03-16 NOTE — ED Notes (Signed)
Called lab to check on blood work results.

## 2022-03-16 NOTE — ED Provider Notes (Signed)
[facility] EMERGENCY DEPARTMENT Provider Note   CSN: [id number] Arrival date & time: 03/16/22  1307     History {Add pertinent medical, surgical, social history, OB history to HPI:1} Chief Complaint  Patient presents with   Fall   Cough   Weakness    Stephanie Moreno is a 76 y.o. female.  76 year old female with history of breast cancer in remission, hypothyroid, CKD, IDDM, GERD, and asthma who presents with generalized weakness and a fall today.  Patient states that over the past several days she has had a dry cough with rhinorrhea and sore throat.  Also with subjective fevers this morning and generalized weakness.  Denies any urinary frequency or urgency.  No chest pain but does have mild shortness of breath.  Is her glucoses have been elevated in the 300s at home.  This morning when she went to get out of bed she was feeling weak and rolled over and hit the floor.  Unsure of the head strike.  Did not have any LOC but has had a headache since then.  Denies any neck pain or weakness or numbness of her arms or legs that is new.   Fall  Cough Weakness Associated symptoms: cough        Home Medications Prior to Admission medications   Medication Sig Start Date End Date Taking? Authorizing Provider  acetaminophen (TYLENOL) 325 MG tablet Take 650 mg by mouth every 4 (four) hours as needed.    [provider]  amitriptyline (ELAVIL) 25 MG tablet Take 25 mg by mouth at bedtime. 10/20/20   [provider]  amLODipine (NORVASC) 10 MG tablet Take 10 mg by mouth daily.    [provider]  Azelastine HCl 0.15 % SOLN Place 1 spray into both nostrils daily. 08/19/21   [provider]  docusate sodium (COLACE) 100 MG capsule Take 1 capsule (100 mg total) by mouth 2 (two) times daily. 01/04/22   Jessy Oto, MD  fluticasone (FLONASE) 50 MCG/ACT nasal spray Place 2 sprays into both nostrils daily.    [provider]  Fluticasone Furoate (ARNUITY  ELLIPTA) 200 MCG/ACT AEPB Inhale 1 puff into the lungs daily.    [provider]  furosemide (LASIX) 20 MG tablet Take 1 tablet (20 mg total) by mouth every other day. Takes 40 mg every other day. On alternate days take 80 mg 04/18/21   Johnson, Clanford L, MD  glipiZIDE (GLUCOTROL XL) 5 MG 24 hr tablet Take 1 tablet (5 mg total) by mouth daily with breakfast. 09/16/21   Brita Romp, NP  insulin degludec (TRESIBA FLEXTOUCH) 100 UNIT/ML FlexTouch Pen Inject 20 Units into the skin at bedtime. 05/04/21   Brita Romp, NP  losartan (COZAAR) 100 MG tablet Take 100 mg by mouth daily. 04/16/21   [provider]  lubiprostone (AMITIZA) 8 MCG capsule Take 8 mcg by mouth 2 (two) times daily with a meal.    [provider]  meclizine (ANTIVERT) 25 MG tablet Take 25 mg by mouth 3 (three) times daily as needed for dizziness.    [provider]  methocarbamol (ROBAXIN) 500 MG tablet Take 1 tablet (500 mg total) by mouth every 6 (six) hours as needed for muscle spasms. 03/19/21   Jessy Oto, MD  mirabegron ER (MYRBETRIQ) 50 MG TB24 tablet Take 1 tablet (50 mg total) by mouth daily. 08/23/21   Stoneking, Reece Leader., MD  ondansetron (ZOFRAN-ODT) 4 MG disintegrating tablet Take 4 mg by  mouth every 8 (eight) hours as needed. 08/02/21   [provider]  pantoprazole (PROTONIX) 40 MG tablet Take 1 tablet (40 mg total) by mouth daily. 30 minutes before breakfast 02/07/22   Annitta Needs, NP  polyethylene glycol (MIRALAX / GLYCOLAX) 17 g packet Take 17 g by mouth daily as needed for mild constipation. 04/15/21   Johnson, Clanford L, MD  polyethylene glycol (MIRALAX) 17 g packet Take 17 g by mouth daily. 02/07/22 05/08/22  Annitta Needs, NP  pregabalin (LYRICA) 50 MG capsule Take 1 capsule (50 mg total) by mouth at bedtime. 09/19/21   Jessy Oto, MD  sucralfate (CARAFATE) 1 g tablet Take 1 tablet (1 g total) by mouth 3 (three) times daily. 01/04/22   Jessy Oto, MD   TRULICITY 3 PI/9.5JO SOPN SMARTSIG:3 Milligram(s) SUB-Q Once a Week 10/31/21   Brita Romp, NP      Allergies    Codeine, Carvedilol, Hydrochlorothiazide, Lisinopril, Oxybutynin, and Penicillins    Review of Systems   Review of Systems  Respiratory:  Positive for cough.   Neurological:  Positive for weakness.    Physical Exam Updated Vital Signs BP (!) 147/78   Pulse 93   Temp 99.2 F (37.3 C) (Oral)   Resp 20   Ht '5\' 3"'$  (1.6 m)   Wt 82.1 kg   SpO2 100%   BMI 32.06 kg/m  Physical Exam Vitals and nursing note reviewed.  Constitutional:      General: She is not in acute distress.    Appearance: She is well-developed.  HENT:     Head: Normocephalic and atraumatic.     Right Ear: External ear normal.     Left Ear: External ear normal.     Nose: Nose normal.  Eyes:     Extraocular Movements: Extraocular movements intact.     Conjunctiva/sclera: Conjunctivae normal.     Pupils: Pupils are equal, round, and reactive to light.  Cardiovascular:     Rate and Rhythm: Normal rate and regular rhythm.     Heart sounds: No murmur heard. Pulmonary:     Effort: Pulmonary effort is normal. No respiratory distress.     Breath sounds: Normal breath sounds.  Abdominal:     General: Abdomen is flat. There is no distension.     Palpations: Abdomen is soft. There is no mass.     Tenderness: There is no abdominal tenderness. There is no guarding.  Musculoskeletal:        General: No swelling.     Cervical back: Normal range of motion and neck supple.     Right lower leg: No edema.     Left lower leg: No edema.  Skin:    General: Skin is warm and dry.     Capillary Refill: Capillary refill takes less than 2 seconds.  Neurological:     General: No focal deficit present.     Mental Status: She is alert and oriented to person, place, and time. Mental status is at baseline.     Cranial Nerves: No cranial nerve deficit.     Sensory: No sensory deficit.     Comments: 5/5 strength  in bilateral upper extremities and right lower extremity.  Left lower extremity 4/5 which patient states is chronic due to bad arthritis.  Cranial nerves II through XII intact.  Psychiatric:        Mood and Affect: Mood normal.     ED Results / Procedures / Treatments  Labs (all labs ordered are listed, but only abnormal results are displayed) Labs Reviewed  CBG MONITORING, ED - Abnormal; Notable for the following components:      Result Value   Glucose-Capillary 282 (*)    All other components within normal limits  RESP PANEL BY RT-PCR (FLU A&B, COVID) ARPGX2  BASIC METABOLIC PANEL  CBC  URINALYSIS, ROUTINE W REFLEX MICROSCOPIC    EKG None  Radiology No results found.  Procedures Procedures  {Document cardiac monitor, telemetry assessment procedure when appropriate:1}  Medications Ordered in ED Medications - No data to display  ED Course/ Medical Decision Making/ A&P                           Medical Decision Making Amount and/or Complexity of Data Reviewed Labs: ordered. Radiology: ordered.  Risk OTC drugs.   ***  {Document critical care time when appropriate:1} {Document review of labs and clinical decision tools ie heart score, Chads2Vasc2 etc:1}  {Document your independent review of radiology images, and any outside records:1} {Document your discussion with family members, caretakers, and with consultants:1} {Document social determinants of health affecting pt's care:1} {Document your decision making why or why not admission, treatments were needed:1} Final Clinical Impression(s) / ED Diagnoses Final diagnoses:  None    Rx / DC Orders ED Discharge Orders     None

## 2022-03-16 NOTE — ED Notes (Signed)
Pt amublated for pulse ox; lowest was 95%

## 2022-03-16 NOTE — ED Triage Notes (Signed)
Pt BIB [facility] for fall, headache, cough, chills, per pt she was getting out of bed and reach for knob and fell out of bed.

## 2022-03-17 ENCOUNTER — Telehealth: Payer: Self-pay | Admitting: *Deleted

## 2022-03-17 ENCOUNTER — Ambulatory Visit (HOSPITAL_COMMUNITY): Admission: RE | Admit: 2022-03-17 | Payer: Medicare Other | Source: Home / Self Care

## 2022-03-17 ENCOUNTER — Encounter (HOSPITAL_COMMUNITY): Admission: RE | Payer: Self-pay | Source: Home / Self Care

## 2022-03-17 SURGERY — ESOPHAGOGASTRODUODENOSCOPY (EGD) WITH PROPOFOL
Anesthesia: Monitor Anesthesia Care

## 2022-03-17 NOTE — Telephone Encounter (Signed)
Received call from endo. Pt in ed yesterday and pt tested positive for covid. Tried calling pt to reschedule put no answer.  I also tried calling pt, no answer and no VM

## 2022-03-20 ENCOUNTER — Ambulatory Visit
Admission: EM | Admit: 2022-03-20 | Discharge: 2022-03-20 | Disposition: A | Discharge status: Home / Self Care | Payer: Medicare Other | Attending: Family Medicine | Admitting: Family Medicine

## 2022-03-20 DIAGNOSIS — U071 COVID-19: Secondary | ICD-10-CM | POA: Diagnosis not present

## 2022-03-20 DIAGNOSIS — R531 Weakness: Secondary | ICD-10-CM

## 2022-03-20 DIAGNOSIS — R197 Diarrhea, unspecified: Secondary | ICD-10-CM

## 2022-03-20 DIAGNOSIS — E876 Hypokalemia: Secondary | ICD-10-CM | POA: Diagnosis not present

## 2022-03-20 NOTE — ED Triage Notes (Signed)
Pt presents on day 7 of covid. Pt was seen in ED where potassium and magnesium were low. Pt has been very weak, her pcp wants her to be seen for possible low potassium again. Pt is having diarrhea.

## 2022-03-20 NOTE — Discharge Instructions (Signed)
I strongly recommend that you go to the emergency department for immediate laboratory evaluation and repletion of electrolytes and fluids if needed.  We have drawn labs on you today and should have these back tomorrow but would be much safer to get results and treatment tonight if needed.  If you start feeling worse at any time overnight please go to the emergency department immediately.  Make sure to drink plenty of fluids, including electrolyte solutions such as Pedialyte or Gatorade and take your potassium supplement.  Take Imodium as needed to help slow down the diarrhea.

## 2022-03-21 ENCOUNTER — Ambulatory Visit: Payer: Medicare Other | Admitting: Nurse Practitioner

## 2022-03-21 ENCOUNTER — Ambulatory Visit (INDEPENDENT_AMBULATORY_CARE_PROVIDER_SITE_OTHER): Payer: Medicare Other | Admitting: Nurse Practitioner

## 2022-03-21 ENCOUNTER — Encounter: Payer: Self-pay | Admitting: Nurse Practitioner

## 2022-03-21 VITALS — BP 118/73 | HR 71 | Ht 63.0 in

## 2022-03-21 DIAGNOSIS — E1122 Type 2 diabetes mellitus with diabetic chronic kidney disease: Secondary | ICD-10-CM | POA: Diagnosis not present

## 2022-03-21 DIAGNOSIS — N1832 Chronic kidney disease, stage 3b: Secondary | ICD-10-CM

## 2022-03-21 DIAGNOSIS — E782 Mixed hyperlipidemia: Secondary | ICD-10-CM | POA: Diagnosis not present

## 2022-03-21 DIAGNOSIS — I10 Essential (primary) hypertension: Secondary | ICD-10-CM

## 2022-03-21 LAB — COMPREHENSIVE METABOLIC PANEL
ALT: 34 IU/L — ABNORMAL HIGH (ref 0–32)
AST: 38 IU/L (ref 0–40)
Albumin/Globulin Ratio: 1.1 — ABNORMAL LOW (ref 1.2–2.2)
Albumin: 4 g/dL (ref 3.8–4.8)
Alkaline Phosphatase: 122 IU/L — ABNORMAL HIGH (ref 44–121)
BUN/Creatinine Ratio: 8 — ABNORMAL LOW (ref 12–28)
BUN: 13 mg/dL (ref 8–27)
Bilirubin Total: 0.2 mg/dL (ref 0.0–1.2)
CO2: 12 mmol/L — ABNORMAL LOW (ref 20–29)
Calcium: 9.1 mg/dL (ref 8.7–10.3)
Chloride: 99 mmol/L (ref 96–106)
Creatinine, Ser: 1.53 mg/dL — ABNORMAL HIGH (ref 0.57–1.00)
Globulin, Total: 3.7 g/dL (ref 1.5–4.5)
Glucose: 357 mg/dL — ABNORMAL HIGH (ref 70–99)
Potassium: 5 mmol/L (ref 3.5–5.2)
Sodium: 128 mmol/L — ABNORMAL LOW (ref 134–144)
Total Protein: 7.7 g/dL (ref 6.0–8.5)
eGFR: 35 mL/min/{1.73_m2} — ABNORMAL LOW (ref >59)

## 2022-03-21 MED ORDER — TRESIBA FLEXTOUCH 100 UNIT/ML ~~LOC~~ SOPN: 50.0000 [IU] | PEN_INJECTOR | Freq: Every day | SUBCUTANEOUS | 3 refills | Status: DC

## 2022-03-21 MED ORDER — GLIPIZIDE ER 5 MG PO TB24: 5.0000 mg | ORAL_TABLET | Freq: Every day | ORAL | 3 refills | Status: DC

## 2022-03-21 NOTE — ED Provider Notes (Signed)
[facility]    CSN: [id number] Arrival date & time: 03/20/22  1710      History   Chief Complaint Chief Complaint  Patient presents with   low potassium    HPI Stephanie Moreno is a 76 y.o. female.   Patient presenting today for ongoing symptoms of COVID-19 infection that started 1 week ago.  She was seen in the emergency department 3 days ago for weakness, diarrhea, fever, sore throat, cough and diagnosed with COVID-19 as well as critical hypokalemia.  Her potassium was 2.7 in the emergency department which was repleted while there and she was sent home on oral potassium supplementation which she has been compliant with.  She states the upper respiratory symptoms have improved but she is progressively more weak, fatigued and having frequent diarrhea.  She took a course of molnupiravir with no relief.  She spoke with her primary care office this morning who sent in Imodium to help with her diarrhea and recommended that she come to urgent care to get her potassium rechecked.  She is trying to stay well-hydrated and tolerating p.o. well.  States her blood sugars have been significantly elevated since COVID-19 infection, staying in the high 200s and low 300s per her home readings.  Past medical history significant for hypertension, insulin-dependent diabetes, CKD, iron deficiency anemia.    Past Medical History:  Diagnosis Date   Acid reflux disease    Anemia    Anxiety    Asthma    Breast cancer ([place])    left side   Chronic back pain    Chronic kidney disease    Depression    Diabetes mellitus without complication ([place])    Diverticulitis    Hypertension    Lumbosacral spondylosis    Per previous PCP records   Sleep apnea    Thyroid disease     Patient Active Problem List   Diagnosis Date Noted   Urge incontinence 07/26/2021   Urinary retention 04/15/2021   Acute kidney injury ([place]) 04/12/2021   Acute renal failure superimposed on chronic kidney disease,  unspecified CKD stage, unspecified acute renal failure type ([place]) 04/12/2021   Acute renal failure superimposed on stage 3b chronic kidney disease ([place]) 04/12/2021   Dehydration    Spondylolisthesis, lumbar region    Spinal stenosis, lumbar region, with neurogenic claudication    Dysphagia 03/18/2021   Speech disturbance 03/18/2021   Wheezing 03/18/2021   Ileus ([place]) 03/18/2021   Fusion of spine of lumbar region 03/15/2021   Cerebrovascular disease 12/20/2020   GERD (gastroesophageal reflux disease) 11/18/2020   Type 2 diabetes mellitus with stage 3b chronic kidney disease, without long-term current use of insulin ([place]) 07/12/2020   Mixed hyperlipidemia 07/12/2020   Essential hypertension, benign 07/12/2020   Iron deficiency anemia    Loss of weight 04/09/2020   Dyspepsia 01/28/2020   Constipation 01/28/2020   Lower abdominal pain 01/28/2020   Normocytic anemia 01/28/2020    Past Surgical History:  Procedure Laterality Date   ABDOMINAL HYSTERECTOMY     BACK SURGERY     BIOPSY  02/19/2020   Procedure: BIOPSY;  Surgeon: Eloise Harman, DO;  Location: [facility];  Service: Endoscopy;;   BREAST LUMPECTOMY Left 2003   BREAST SURGERY     COLONOSCOPY WITH PROPOFOL N/A 02/19/2020   non-bleeding internal hemorrhoids, many small-mouthed diverticula in entire colon.   ESOPHAGOGASTRODUODENOSCOPY (EGD) WITH PROPOFOL N/A 02/19/2020    benign-appearing esophageal stenosis s/p dilation, gastritis s/p biopsy, normal duodenum.  Negative H.pylori, negative duodenal biopsy.    GIVENS CAPSULE STUDY N/A 06/14/2020   Procedure: GIVENS CAPSULE STUDY;  Surgeon: Eloise Harman, DO;  Location: [facility];  Service: Endoscopy;  Laterality: N/A;  7:30am   THYROIDECTOMY     tummy tuck     at time of breast surgery 2003    OB History   No obstetric history on file.      Home Medications    Prior to Admission medications   Medication Sig Start Date End Date Taking? Authorizing Provider   acetaminophen (TYLENOL) 325 MG tablet Take 650 mg by mouth every 4 (four) hours as needed.    [provider]  amLODipine (NORVASC) 10 MG tablet Take 10 mg by mouth daily.    [provider]  aspirin 81 MG chewable tablet Chew 81 mg by mouth daily.    [provider]  Azelastine HCl 0.15 % SOLN Place 1 spray into both nostrils daily. 08/19/21   [provider]  docusate sodium (COLACE) 100 MG capsule Take 1 capsule (100 mg total) by mouth 2 (two) times daily. 01/04/22   Jessy Oto, MD  fluticasone (FLONASE) 50 MCG/ACT nasal spray Place 2 sprays into both nostrils daily.    [provider]  Fluticasone Furoate (ARNUITY ELLIPTA) 200 MCG/ACT AEPB Inhale 1 puff into the lungs daily.    [provider]  furosemide (LASIX) 20 MG tablet Take 1 tablet (20 mg total) by mouth every other day. Takes 40 mg every other day. On alternate days take 80 mg 04/18/21   Johnson, Clanford L, MD  glipiZIDE (GLUCOTROL XL) 5 MG 24 hr tablet Take 1 tablet (5 mg total) by mouth daily with breakfast. 03/21/22   Brita Romp, NP  insulin degludec (TRESIBA FLEXTOUCH) 100 UNIT/ML FlexTouch Pen Inject 50 Units into the skin at bedtime. 03/21/22   Brita Romp, NP  lidocaine (LIDODERM) 5 % Place 1 patch onto the skin daily.    [provider]  losartan (COZAAR) 100 MG tablet Take 100 mg by mouth daily. 04/16/21   [provider]  lubiprostone (AMITIZA) 8 MCG capsule Take 8 mcg by mouth 2 (two) times daily with a meal.    [provider]  meclizine (ANTIVERT) 25 MG tablet Take 25 mg by mouth 3 (three) times daily as needed for dizziness.    [provider]  mirabegron ER (MYRBETRIQ) 50 MG TB24 tablet Take 1 tablet (50 mg total) by mouth daily. 08/23/21   Stoneking, Reece Leader., MD  nirmatrelvir/ritonavir EUA, renal dosing, (PAXLOVID) 10 x 150 MG & 10 x 100MG TABS Take 2 tablets by mouth 2 (two) times daily for 5 days. Patient GFR is  41. Take nirmatrelvir (150 mg) one tablet twice daily for 5 days and ritonavir (100 mg) one tablet twice daily for 5 days. 03/16/22 03/21/22  Fransico Meadow, MD  pantoprazole (PROTONIX) 40 MG tablet Take 1 tablet (40 mg total) by mouth daily. 30 minutes before breakfast 02/07/22   Annitta Needs, NP  polyethylene glycol (MIRALAX / GLYCOLAX) 17 g packet Take 17 g by mouth daily as needed for mild constipation. 04/15/21   Johnson, Clanford L, MD  potassium chloride SA (KLOR-CON M) 20 MEQ tablet Take 2 tablets (40 mEq total) by mouth 3 (three) times a week for 7 days. 03/17/22 03/24/22  Fransico Meadow, MD  sucralfate (CARAFATE) 1 g tablet Take 1 tablet (1 g total) by mouth 3 (three) times daily.  01/04/22   Jessy Oto, MD  TRULICITY 3 VA/9.1BT SOPN SMARTSIG:3 Milligram(s) SUB-Q Once a Week 10/31/21   Brita Romp, NP    Family History Family History  Problem Relation Age of Onset   Diabetes Mother    Thyroid disease Mother    Colon cancer Neg Hx     Social History Social History   Tobacco Use   Smoking status: Never   Smokeless tobacco: Never  Vaping Use   Vaping Use: Never used  Substance Use Topics   Alcohol use: Not Currently   Drug use: Not Currently     Allergies   Codeine, Carvedilol, Hydrochlorothiazide, Lisinopril, Oxybutynin, and Penicillins   Review of Systems Review of Systems Per HPI  Physical Exam Triage Vital Signs ED Triage Vitals  Enc Vitals Group     BP 03/20/22 1718 137/76     Pulse Rate 03/20/22 1718 82     Resp 03/20/22 1718 17     Temp --      Temp src --      SpO2 03/20/22 1718 98 %     Weight --      Height --      Head Circumference --      Peak Flow --      Pain Score 03/20/22 1738 0     Pain Loc --      Pain Edu? --      Excl. in [place]? --    No data found.  Updated Vital Signs BP 137/76   Pulse 82   Resp 17   SpO2 98%   Visual Acuity Right Eye Distance:   Left Eye Distance:   Bilateral Distance:    Right Eye Near:    Left Eye Near:    Bilateral Near:     Physical Exam Vitals and nursing note reviewed.  Constitutional:      Comments: Mildly lethargic but responsive and appropriate.   HENT:     Head: Atraumatic.     Mouth/Throat:     Mouth: Mucous membranes are moist.  Eyes:     Extraocular Movements: Extraocular movements intact.     Conjunctiva/sclera: Conjunctivae normal.  Cardiovascular:     Rate and Rhythm: Normal rate and regular rhythm.     Heart sounds: Normal heart sounds.  Pulmonary:     Effort: Pulmonary effort is normal.     Breath sounds: Normal breath sounds.  Abdominal:     General: Bowel sounds are normal. There is no distension.     Palpations: Abdomen is soft.     Tenderness: There is no abdominal tenderness. There is no guarding.  Musculoskeletal:        General: Normal range of motion.     Cervical back: Normal range of motion and neck supple.  Skin:    General: Skin is warm and dry.  Neurological:     Mental Status: She is oriented to person, place, and time.  Psychiatric:        Mood and Affect: Mood normal.        Thought Content: Thought content normal.        Judgment: Judgment normal.      UC Treatments / Results  Labs (all labs ordered are listed, but only abnormal results are displayed) Labs Reviewed  COMPREHENSIVE METABOLIC PANEL - Abnormal; Notable for the following components:      Result Value   Glucose 357 (*)    Creatinine, Ser 1.53 (*)  eGFR 35 (*)    BUN/Creatinine Ratio 8 (*)    Sodium 128 (*)    CO2 12 (*)    Albumin/Globulin Ratio 1.1 (*)    Alkaline Phosphatase 122 (*)    ALT 34 (*)    All other components within normal limits   Narrative:    Performed at:  [address], [place], [place]  [id number] Lab Director: Rush Farmer MD, Phone:  [phone]    EKG   Radiology No results found.  Procedures Procedures (including critical care time)  Medications Ordered in UC Medications - No data to  display  Initial Impression / Assessment and Plan / UC Course  I have reviewed the triage vital signs and the nursing notes.  Pertinent labs & imaging results that were available during my care of the patient were reviewed by me and considered in my medical decision making (see chart for details).     Labs pending for recheck of electrolytes, continue potassium supplementation, good fluid intake, rest.  Imodium as needed to slow the diarrhea.  ER for worsening symptoms while awaiting results.  Final Clinical Impressions(s) / UC Diagnoses   Final diagnoses:  Hypokalemia  Weakness  Diarrhea, unspecified type  COVID-19     Discharge Instructions      I strongly recommend that you go to the emergency department for immediate laboratory evaluation and repletion of electrolytes and fluids if needed.  We have drawn labs on you today and should have these back tomorrow but would be much safer to get results and treatment tonight if needed.  If you start feeling worse at any time overnight please go to the emergency department immediately.  Make sure to drink plenty of fluids, including electrolyte solutions such as Pedialyte or Gatorade and take your potassium supplement.  Take Imodium as needed to help slow down the diarrhea.    ED Prescriptions   None    PDMP not reviewed this encounter.   Volney American, [place] 03/21/22 1945

## 2022-03-21 NOTE — Progress Notes (Signed)
03/21/2022, 5:00 PM  Endocrinology follow-up note   Subjective:    Patient ID: Stephanie Moreno, female    DOB: 1945/10/15.  Stephanie Moreno is being seen in follow-up after she was seen in consultation for management of currently uncontrolled symptomatic diabetes requested by  Margretta Sidle, MD.   Past Medical History:  Diagnosis Date   Acid reflux disease    Anemia    Anxiety    Asthma    Breast cancer ([place])    left side   Chronic back pain    Chronic kidney disease    Depression    Diabetes mellitus without complication ([place])    Diverticulitis    Hypertension    Lumbosacral spondylosis    Per previous PCP records   Sleep apnea    Thyroid disease     Past Surgical History:  Procedure Laterality Date   ABDOMINAL HYSTERECTOMY     BACK SURGERY     BIOPSY  02/19/2020   Procedure: BIOPSY;  Surgeon: Eloise Harman, DO;  Location: [facility];  Service: Endoscopy;;   BREAST LUMPECTOMY Left 2003   BREAST SURGERY     COLONOSCOPY WITH PROPOFOL N/A 02/19/2020   non-bleeding internal hemorrhoids, many small-mouthed diverticula in entire colon.   ESOPHAGOGASTRODUODENOSCOPY (EGD) WITH PROPOFOL N/A 02/19/2020    benign-appearing esophageal stenosis s/p dilation, gastritis s/p biopsy, normal duodenum. Negative H.pylori, negative duodenal biopsy.    GIVENS CAPSULE STUDY N/A 06/14/2020   Procedure: GIVENS CAPSULE STUDY;  Surgeon: Eloise Harman, DO;  Location: [facility];  Service: Endoscopy;  Laterality: N/A;  7:30am   THYROIDECTOMY     tummy tuck     at time of breast surgery 2003    Social History   Socioeconomic History   Marital status: Divorced    Spouse name: Not on file   Number of children: Not on file   Years of education: Not on file   Highest education level: Not on file  Occupational History   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 [place]  Tobacco Use   Smoking status: Never   Smokeless tobacco: Never   Vaping Use   Vaping Use: Never used  Substance and Sexual Activity   Alcohol use: Not Currently   Drug use: Not Currently   Sexual activity: Not on file  Other Topics Concern   Not on file  Social History Narrative   Not on file   Social Determinants of Health   Financial Resource Strain: Not on file  Food Insecurity: Not on file  Transportation Needs: Not on file  Physical Activity: Not on file  Stress: Not on file  Social Connections: Not on file    Family History  Problem Relation Age of Onset   Diabetes Mother    Thyroid disease Mother    Colon cancer Neg Hx     Outpatient Encounter Medications as of 03/21/2022  Medication Sig   acetaminophen (TYLENOL) 325 MG tablet Take 650 mg by mouth every 4 (four) hours as needed.   amLODipine (NORVASC) 10 MG tablet Take 10 mg by mouth daily.   aspirin 81 MG chewable tablet Chew 81 mg by mouth daily.   Azelastine HCl 0.15 % SOLN Place 1 spray into both nostrils daily.   docusate sodium (COLACE) 100 MG capsule Take 1 capsule (100 mg total) by mouth 2 (two) times daily.   fluticasone (FLONASE) 50 MCG/ACT nasal spray  Place 2 sprays into both nostrils daily.   Fluticasone Furoate (ARNUITY ELLIPTA) 200 MCG/ACT AEPB Inhale 1 puff into the lungs daily.   furosemide (LASIX) 20 MG tablet Take 1 tablet (20 mg total) by mouth every other day. Takes 40 mg every other day. On alternate days take 80 mg   lidocaine (LIDODERM) 5 % Place 1 patch onto the skin daily.   losartan (COZAAR) 100 MG tablet Take 100 mg by mouth daily.   lubiprostone (AMITIZA) 8 MCG capsule Take 8 mcg by mouth 2 (two) times daily with a meal.   meclizine (ANTIVERT) 25 MG tablet Take 25 mg by mouth 3 (three) times daily as needed for dizziness.   mirabegron ER (MYRBETRIQ) 50 MG TB24 tablet Take 1 tablet (50 mg total) by mouth daily.   nirmatrelvir/ritonavir EUA, renal dosing, (PAXLOVID) 10 x 150 MG & 10 x '100MG'$  TABS Take 2 tablets by mouth 2 (two) times daily for 5 days.  Patient GFR is 41. Take nirmatrelvir (150 mg) one tablet twice daily for 5 days and ritonavir (100 mg) one tablet twice daily for 5 days.   pantoprazole (PROTONIX) 40 MG tablet Take 1 tablet (40 mg total) by mouth daily. 30 minutes before breakfast   polyethylene glycol (MIRALAX / GLYCOLAX) 17 g packet Take 17 g by mouth daily as needed for mild constipation.   potassium chloride SA (KLOR-CON M) 20 MEQ tablet Take 2 tablets (40 mEq total) by mouth 3 (three) times a week for 7 days.   sucralfate (CARAFATE) 1 g tablet Take 1 tablet (1 g total) by mouth 3 (three) times daily.   TRULICITY 3 FE/0.7HQ SOPN SMARTSIG:3 Milligram(s) SUB-Q Once a Week   [DISCONTINUED] glipiZIDE (GLUCOTROL XL) 5 MG 24 hr tablet Take 1 tablet (5 mg total) by mouth daily with breakfast. (Patient taking differently: Take 10 mg by mouth daily with breakfast.)   [DISCONTINUED] insulin degludec (TRESIBA FLEXTOUCH) 100 UNIT/ML FlexTouch Pen Inject 20 Units into the skin at bedtime.   glipiZIDE (GLUCOTROL XL) 5 MG 24 hr tablet Take 1 tablet (5 mg total) by mouth daily with breakfast.   insulin degludec (TRESIBA FLEXTOUCH) 100 UNIT/ML FlexTouch Pen Inject 50 Units into the skin at bedtime.   No facility-administered encounter medications on file as of 03/21/2022.    ALLERGIES: Allergies  Allergen Reactions   Codeine Nausea And Vomiting and Anaphylaxis   Carvedilol     Cold sweats, diarrhea, severe hot flashes   Hydrochlorothiazide Other (See Comments)    Other reaction(s): stomach upset   Lisinopril Nausea And Vomiting    Other reaction(s): cough   Oxybutynin Other (See Comments)   Penicillins Hives    VACCINATION STATUS: Immunization History  Administered Date(s) Administered   Fluad Quad(high Dose 65+) 03/17/2021   Pneumococcal Conjugate-13 09/02/2014   Pneumococcal Polysaccharide-23 07/03/2010   Tdap 07/03/2000    Diabetes She presents for her follow-up diabetic visit. She has type 2 diabetes mellitus. Onset  time: Diagnosed at approx age of 46. Her disease course has been worsening. There are no hypoglycemic associated symptoms. Associated symptoms include fatigue and foot paresthesias. Pertinent negatives for diabetes include no polydipsia, no polyuria and no weight loss. There are no hypoglycemic complications. Symptoms are stable. Diabetic complications include a CVA, nephropathy and peripheral neuropathy. Risk factors for coronary artery disease include diabetes mellitus, dyslipidemia, hypertension, post-menopausal and sedentary lifestyle. Current diabetic treatment includes oral agent (monotherapy) and insulin injections (and Trulicity). She is compliant with treatment most of the time. Her weight  is fluctuating minimally. She is following a generally healthy diet. When asked about meal planning, she reported none. She has not had a previous visit with a dietitian. She rarely participates in exercise. Her home blood glucose trend is increasing rapidly. Her overall blood glucose range is >200 mg/dl. (She presents today, accompanied by caregiver, with her CGM showing gross hyperglycemia for the last 2 weeks.  She was recently diagnosed with COVID prior to a procedure to stretch her esophagus and she is symptomatic.  Analysis of her CGM shows TIR 5%, TAR 95%, TBR 0% with GMI of 10%.  She notes the pharmacy never sent her the Glipizide, therefore she has not been taking it. ) An ACE inhibitor/angiotensin II receptor blocker is being taken. She does not see a podiatrist.Eye exam is current.  Hypertension This is a chronic problem. The current episode started more than 1 year ago. The problem is unchanged. The problem is controlled. Associated symptoms include peripheral edema. Agents associated with hypertension include thyroid hormones. Risk factors for coronary artery disease include diabetes mellitus, dyslipidemia, post-menopausal state and sedentary lifestyle. Past treatments include diuretics, beta blockers, ACE  inhibitors and angiotensin blockers. There are no compliance problems.  Hypertensive end-organ damage includes kidney disease and CVA. Identifiable causes of hypertension include chronic renal disease, sleep apnea and a thyroid problem.  Hyperlipidemia This is a chronic problem. The current episode started more than 1 year ago. The problem is controlled. Recent lipid tests were reviewed and are normal. Exacerbating diseases include chronic renal disease and hypothyroidism. Factors aggravating her hyperlipidemia include beta blockers. Current antihyperlipidemic treatment includes statins. The current treatment provides moderate improvement of lipids. There are no compliance problems.  Risk factors for coronary artery disease include diabetes mellitus, dyslipidemia, hypertension, obesity, post-menopausal and a sedentary lifestyle.     Review of systems  Constitutional: + minimally fluctuating body weight,  current Body mass index is 32.06 kg/m. , + fatigue, no subjective hyperthermia, no subjective hypothermia Eyes: no blurry vision, no xerophthalmia ENT: no sore throat, no nodules palpated in throat, no dysphagia/odynophagia, no hoarseness Cardiovascular: no chest pain, no shortness of breath, no palpitations, + leg swelling Respiratory: no cough, no shortness of breath Gastrointestinal: no nausea/vomiting/diarrhea Musculoskeletal: ongoing back pain, walks with walker due to previous CVA and disequilibrium- has frequent falls Skin: no rashes, no hyperemia Neurological: no tremors, no dizziness, + numbness/tingling to BLE (on gabapentin) Psychiatric: no depression, no anxiety  Objective:     BP 118/73 (BP Location: Right Arm, Patient Position: Sitting, Cuff Size: Large)   Pulse 71   Ht '5\' 3"'$  (1.6 m)   BMI 32.06 kg/m   Wt Readings from Last 3 Encounters:  03/16/22 181 lb (82.1 kg)  03/14/22 181 lb (82.1 kg)  02/07/22 181 lb (82.1 kg)    BP Readings from Last 3 Encounters:  03/21/22  118/73  03/20/22 137/76  03/16/22 (!) 152/85     Physical Exam- Limited  Constitutional:  Body mass index is 32.06 kg/m. , not in acute distress, normal state of mind Eyes:  EOMI, no exophthalmos Neck: Supple Cardiovascular: RRR, no murmurs, rubs, or gallops, + pitting edema to BLE Respiratory: Adequate breathing efforts, no crackles, rales, rhonchi, or wheezing Musculoskeletal: no gross deformities,  walks with walker due to recent back surgery and frequent falls Skin:  no rashes, no hyperemia Neurological: no tremor with outstretched hands     CMP ( most recent) CMP     Component Value Date/Time   NA 128 (L)  03/20/2022 1743   K 5.0 03/20/2022 1743   CL 99 03/20/2022 1743   CO2 12 (L) 03/20/2022 1743   GLUCOSE 357 (H) 03/20/2022 1743   GLUCOSE 238 (H) 03/16/2022 1709   BUN 13 03/20/2022 1743   CREATININE 1.53 (H) 03/20/2022 1743   CREATININE 1.42 (H) 10/12/2020 1009   CALCIUM 9.1 03/20/2022 1743   PROT 7.7 03/20/2022 1743   ALBUMIN 4.0 03/20/2022 1743   AST 38 03/20/2022 1743   ALT 34 (H) 03/20/2022 1743   ALKPHOS 122 (H) 03/20/2022 1743   BILITOT 0.2 03/20/2022 1743   GFRNONAA 41 (L) 03/16/2022 1709   GFRNONAA 36 (L) 10/12/2020 1009   GFRAA 42 (L) 10/12/2020 1009     Diabetic Labs (most recent): Lab Results  Component Value Date   HGBA1C 8.7 12/19/2021   HGBA1C 7.8 (A) 09/16/2021   HGBA1C 7.2 04/19/2021   MICROALBUR 80 10/26/2020     Lipid Panel ( most recent) Lipid Panel     Component Value Date/Time   CHOL 91 04/19/2021 0000   TRIG 78 04/19/2021 0000   HDL 42 04/19/2021 0000   CHOLHDL 2.0 10/12/2020 1009   LDLCALC 34 04/19/2021 0000   LDLCALC 46 10/12/2020 1009      Lab Results  Component Value Date   TSH 3.94 10/12/2020   TSH 1.18 03/09/2020      Assessment & Plan:   1) Type 2 diabetes mellitus with stage 3b chronic kidney disease, without long-term current use of insulin ([place])  - Stephanie Moreno has currently uncontrolled symptomatic  type 2 DM since  76 years of age.  She presents today, accompanied by caregiver, with her CGM showing gross hyperglycemia for the last 2 weeks.  She was recently diagnosed with COVID prior to a procedure to stretch her esophagus and she is symptomatic.  Analysis of her CGM shows TIR 5%, TAR 95%, TBR 0% with GMI of 10%.  She notes the pharmacy never sent her the Glipizide, therefore she has not been taking it.    Recent labs reviewed.    - I had a long discussion with her about the progressive nature of diabetes and the pathology behind its complications. -her diabetes is complicated by CKD, CVA and she remains at a high risk for more acute and chronic complications which include CAD, CVA, CKD, retinopathy, and neuropathy. These are all discussed in detail with her.  - Nutritional counseling repeated at each appointment due to patients tendency to fall back in to old habits.  - The patient admits there is a room for improvement in their diet and drink choices. -  Suggestion is made for the patient to avoid simple carbohydrates from their diet including Cakes, Sweet Desserts / Pastries, Ice Cream, Soda (diet and regular), Sweet Tea, Candies, Chips, Cookies, Sweet Pastries, Store Bought Juices, Alcohol in Excess of 1-2 drinks a day, Artificial Sweeteners, Coffee Creamer, and "Sugar-free" Products. This will help patient to have stable blood glucose profile and potentially avoid unintended weight gain.   - I encouraged the patient to switch to unprocessed or minimally processed complex starch and increased protein intake (animal or plant source), fruits, and vegetables.   - Patient is advised to stick to a routine mealtimes to eat 3 meals a day and avoid unnecessary snacks (to snack only to correct hypoglycemia).  - she will be scheduled with Jearld Fenton, RDN, CDE for diabetes education.  - I have approached her with the following individualized plan to manage  her  diabetes and patient agrees:    - she will need at least basal insulin in order for her to achieve control of diabetes to target, and she accepts this treatment option at this time.    -She is advised to increase her Tresiba to 50 units SQ nightly, restart her Glipizide 5 mg XL daily with breakfast (rx resent to pharmacy), and continue her Trulicity 3 mg SQ weekly.    -She is encouraged to continue monitoring blood glucose twice daily (using her CGM), before breakfast and before bed, and to call the clinic if she has readings less than 70 or greater than 300 for 3 tests in a row.   - Specific targets for  A1c;  LDL, HDL,  and Triglycerides were discussed with the patient.  2) Blood Pressure /Hypertension:   Her blood pressure is controlled to target for her age.   she is advised to continue her current medications including Losartan 100 mg p.o. daily with breakfast, and Lasix 40 mg and 80 mg on alternating days.  3) Lipids/Hyperlipidemia:    Review of her recent lipid panel from 04/19/21 showed controlled LDL at 34.  she  is advised to continue Crestor 10 mg p.o. daily at bedtime.   Side effects and precautions discussed with her.  4)  Weight/Diet:  Her Body mass index is 32.06 kg/m.  -   clearly complicating her diabetes care.   she is  a candidate for weight loss. I discussed with her the fact that loss of 5 - 10% of her  current body weight will have the most impact on her diabetes management.  Exercise, and detailed carbohydrates information provided  -  detailed on discharge instructions.  5) Chronic Care/Health Maintenance: -she  is on ACEI/ARB and Statin medications and  is encouraged to initiate and continue to follow up with Ophthalmology, Dentist,  Podiatrist at least yearly or according to recommendations, and advised to  stay away from smoking. I have recommended yearly flu vaccine and pneumonia vaccine at least every 5 years; she cannot exercise optimally due to her disequilibrium.  She is advised to sleep for  at least 7 hours a day.  - she is  advised to maintain close follow up with Margretta Sidle, MD for primary care needs, as well as her other providers for optimal and coordinated care.      I spent 30 minutes in the care of the patient today including review of labs from [place], Lipids, Thyroid Function, Hematology (current and previous including abstractions from other facilities); face-to-face time discussing  her blood glucose readings/logs, discussing hypoglycemia and hyperglycemia episodes and symptoms, medications doses, her options of short and long term treatment based on the latest standards of care / guidelines;  discussion about incorporating lifestyle medicine;  and documenting the encounter. Risk reduction counseling performed per USPSTF guidelines to reduce obesity and cardiovascular risk factors.     Please refer to Patient Instructions for Blood Glucose Monitoring and Insulin/Medications Dosing Guide"  in media tab for additional information. Please  also refer to " Patient Self Inventory" in the Media  tab for reviewed elements of pertinent patient history.  Stephanie Moreno participated in the discussions, expressed understanding, and voiced agreement with the above plans.  All questions were answered to her satisfaction. she is encouraged to contact clinic should she have any questions or concerns prior to her return visit.   Follow up plan: - Return in about 1 month (around 04/20/2022) for Diabetes F/U with  A1c in office, Bring meter and logs, Previsit labs.  Rayetta Pigg, [facility] [address] [place], [place] [address] Phone: [phone] Fax: [phone]  03/21/2022, 5:00 PM

## 2022-03-22 NOTE — Telephone Encounter (Signed)
Called pt, no answer and not able to leave VM 

## 2022-03-23 ENCOUNTER — Emergency Department (HOSPITAL_COMMUNITY)
Admission: EM | Admit: 2022-03-23 | Discharge: 2022-03-23 | Disposition: A | Discharge status: Home / Self Care | Payer: Medicare Other | Attending: Emergency Medicine | Admitting: Emergency Medicine

## 2022-03-23 ENCOUNTER — Other Ambulatory Visit: Payer: Self-pay

## 2022-03-23 ENCOUNTER — Encounter (HOSPITAL_COMMUNITY): Payer: Self-pay

## 2022-03-23 DIAGNOSIS — I10 Essential (primary) hypertension: Secondary | ICD-10-CM | POA: Insufficient documentation

## 2022-03-23 DIAGNOSIS — U071 COVID-19: Secondary | ICD-10-CM | POA: Insufficient documentation

## 2022-03-23 DIAGNOSIS — N179 Acute kidney failure, unspecified: Secondary | ICD-10-CM | POA: Diagnosis not present

## 2022-03-23 DIAGNOSIS — E860 Dehydration: Secondary | ICD-10-CM | POA: Diagnosis not present

## 2022-03-23 DIAGNOSIS — Z794 Long term (current) use of insulin: Secondary | ICD-10-CM | POA: Insufficient documentation

## 2022-03-23 DIAGNOSIS — Z79899 Other long term (current) drug therapy: Secondary | ICD-10-CM | POA: Insufficient documentation

## 2022-03-23 DIAGNOSIS — R059 Cough, unspecified: Secondary | ICD-10-CM | POA: Diagnosis present

## 2022-03-23 DIAGNOSIS — Z7982 Long term (current) use of aspirin: Secondary | ICD-10-CM | POA: Insufficient documentation

## 2022-03-23 LAB — CBC
HCT: 39.1 % (ref 36.0–46.0)
Hemoglobin: 13 g/dL (ref 12.0–15.0)
MCH: 28.3 pg (ref 26.0–34.0)
MCHC: 33.2 g/dL (ref 30.0–36.0)
MCV: 85 fL (ref 80.0–100.0)
Platelets: 265 10*3/uL (ref 150–400)
RBC: 4.6 MIL/uL (ref 3.87–5.11)
RDW: 13 % (ref 11.5–15.5)
WBC: 5.9 10*3/uL (ref 4.0–10.5)
nRBC: 0 % (ref 0.0–0.2)

## 2022-03-23 LAB — COMPREHENSIVE METABOLIC PANEL
ALT: 30 U/L (ref 0–44)
AST: 33 U/L (ref 15–41)
Albumin: 3.7 g/dL (ref 3.5–5.0)
Alkaline Phosphatase: 96 U/L (ref 38–126)
Anion gap: 9 (ref 5–15)
BUN: 25 mg/dL — ABNORMAL HIGH (ref 8–23)
CO2: 18 mmol/L — ABNORMAL LOW (ref 22–32)
Calcium: 9.3 mg/dL (ref 8.9–10.3)
Chloride: 102 mmol/L (ref 98–111)
Creatinine, Ser: 2.01 mg/dL — ABNORMAL HIGH (ref 0.44–1.00)
GFR, Estimated: 25 mL/min — ABNORMAL LOW (ref >60)
Glucose, Bld: 218 mg/dL — ABNORMAL HIGH (ref 70–99)
Potassium: 5.2 mmol/L — ABNORMAL HIGH (ref 3.5–5.1)
Sodium: 129 mmol/L — ABNORMAL LOW (ref 135–145)
Total Bilirubin: 0.4 mg/dL (ref 0.3–1.2)
Total Protein: 8 g/dL (ref 6.5–8.1)

## 2022-03-23 LAB — MAGNESIUM: Magnesium: 1.8 mg/dL (ref 1.7–2.4)

## 2022-03-23 MED ORDER — SODIUM CHLORIDE 0.9 % IV BOLUS
1000.0000 mL | Freq: Once | INTRAVENOUS | Status: AC
  Administered 2022-03-23: 1000 mL via INTRAVENOUS

## 2022-03-23 MED ORDER — SODIUM CHLORIDE 0.9 % IV BOLUS: 500.0000 mL | Freq: Once | INTRAVENOUS | Status: DC

## 2022-03-23 NOTE — Discharge Instructions (Addendum)
It was our pleasure to provide your ER care today - we hope that you feel better.  Drink plenty of fluids/stay well hydrated.   As we discussed, from your lab tests, your kidney function tests are elevated compared to prior - for now, make sure to stay well hydrated, avoid taking ibuprofen/motrin, naprosyn/aleve, or any non-steroidal anti-inflammatory type of medicine, hold/stop your losaratan and lasix for now, and follow up closely with primary care doctor in the next 1-2 days - have your lab work rechecked then.  Return to ER right away if worse, new symptoms, increased trouble breathing, new/severe pain, chest pain, trouble breathing, persistent vomiting, weak/fainting, or other concern.

## 2022-03-23 NOTE — ED Provider Notes (Signed)
[facility] EMERGENCY DEPARTMENT Provider Note   CSN: [id number] Arrival date & time: 03/23/22  1155     History  Chief Complaint  Patient presents with   Covid Positive    Stephanie Moreno is a 76 y.o. female.  Pt indicates was diagnosed with covid last week, and continues to feel generally weak. No acute or abrupt worsening today, but was told by need some fluids. Pt w non prod cough, congestion, body aches. No hemoptysis. No severe headaches. Denies chest pain or discomfort. No sob. No abd pain or vomiting/diarrhea. No dysuria. No extremity pain or swelling. No syncope.   The history is provided by the patient and medical records.       Home Medications Prior to Admission medications   Medication Sig Start Date End Date Taking? Authorizing Provider  acetaminophen (TYLENOL) 325 MG tablet Take 650 mg by mouth every 4 (four) hours as needed.    [provider]  amLODipine (NORVASC) 10 MG tablet Take 10 mg by mouth daily.    [provider]  aspirin 81 MG chewable tablet Chew 81 mg by mouth daily.    [provider]  Azelastine HCl 0.15 % SOLN Place 1 spray into both nostrils daily. 08/19/21   [provider]  docusate sodium (COLACE) 100 MG capsule Take 1 capsule (100 mg total) by mouth 2 (two) times daily. 01/04/22   Jessy Oto, MD  fluticasone (FLONASE) 50 MCG/ACT nasal spray Place 2 sprays into both nostrils daily.    [provider]  Fluticasone Furoate (ARNUITY ELLIPTA) 200 MCG/ACT AEPB Inhale 1 puff into the lungs daily.    [provider]  furosemide (LASIX) 20 MG tablet Take 1 tablet (20 mg total) by mouth every other day. Takes 40 mg every other day. On alternate days take 80 mg 04/18/21   Johnson, Clanford L, MD  glipiZIDE (GLUCOTROL XL) 5 MG 24 hr tablet Take 1 tablet (5 mg total) by mouth daily with breakfast. 03/21/22   Brita Romp, NP  insulin degludec (TRESIBA FLEXTOUCH) 100 UNIT/ML FlexTouch Pen Inject 50  Units into the skin at bedtime. 03/21/22   Brita Romp, NP  lidocaine (LIDODERM) 5 % Place 1 patch onto the skin daily.    [provider]  losartan (COZAAR) 100 MG tablet Take 100 mg by mouth daily. 04/16/21   [provider]  lubiprostone (AMITIZA) 8 MCG capsule Take 8 mcg by mouth 2 (two) times daily with a meal.    [provider]  meclizine (ANTIVERT) 25 MG tablet Take 25 mg by mouth 3 (three) times daily as needed for dizziness.    [provider]  mirabegron ER (MYRBETRIQ) 50 MG TB24 tablet Take 1 tablet (50 mg total) by mouth daily. 08/23/21   Stoneking, Reece Leader., MD  pantoprazole (PROTONIX) 40 MG tablet Take 1 tablet (40 mg total) by mouth daily. 30 minutes before breakfast 02/07/22   Annitta Needs, NP  polyethylene glycol (MIRALAX / GLYCOLAX) 17 g packet Take 17 g by mouth daily as needed for mild constipation. 04/15/21   Johnson, Clanford L, MD  potassium chloride SA (KLOR-CON M) 20 MEQ tablet Take 2 tablets (40 mEq total) by mouth 3 (three) times a week for 7 days. 03/17/22 03/24/22  Fransico Meadow, MD  sucralfate (CARAFATE) 1 g tablet Take 1 tablet (1 g total) by mouth 3 (three) times daily. 01/04/22   Jessy Oto, MD  TRULICITY 3 FM/3.8GY SOPN SMARTSIG:3 Milligram(s)  SUB-Q Once a Week 10/31/21   Brita Romp, NP      Allergies    Codeine, Carvedilol, Hydrochlorothiazide, Lisinopril, Oxybutynin, and Penicillins    Review of Systems   Review of Systems  Constitutional:  Negative for chills and fever.  HENT:  Positive for congestion and rhinorrhea. Negative for sore throat.   Eyes:  Negative for redness.  Respiratory:  Positive for cough. Negative for shortness of breath.   Cardiovascular:  Negative for chest pain and leg swelling.  Gastrointestinal:  Negative for abdominal pain, diarrhea and vomiting.  Genitourinary:  Negative for dysuria and flank pain.  Musculoskeletal:  Positive for myalgias. Negative for back pain, neck pain and  neck stiffness.  Skin:  Negative for rash.  Neurological:  Negative for headaches.  Hematological:  Does not bruise/bleed easily.  Psychiatric/Behavioral:  Negative for confusion.     Physical Exam Updated Vital Signs BP (!) 131/95   Pulse 87   Temp 98.4 F (36.9 C) (Oral)   Resp 16   Ht 1.6 m ('5\' 3"'$ )   Wt 81.6 kg   SpO2 99%   BMI 31.89 kg/m  Physical Exam Vitals and nursing note reviewed.  Constitutional:      Appearance: Normal appearance. She is well-developed.  HENT:     Head: Atraumatic.     Nose: Congestion present.     Mouth/Throat:     Mouth: Mucous membranes are moist.     Pharynx: Oropharynx is clear.  Eyes:     General: No scleral icterus.    Conjunctiva/sclera: Conjunctivae normal.     Pupils: Pupils are equal, round, and reactive to light.  Neck:     Trachea: No tracheal deviation.     Comments: No stiffness or rigidity.  Cardiovascular:     Rate and Rhythm: Normal rate and regular rhythm.     Pulses: Normal pulses.     Heart sounds: Normal heart sounds. No murmur heard.    No friction rub. No gallop.  Pulmonary:     Effort: Pulmonary effort is normal. No respiratory distress.     Breath sounds: Normal breath sounds.  Abdominal:     General: Bowel sounds are normal. There is no distension.     Palpations: Abdomen is soft.     Tenderness: There is no abdominal tenderness.  Genitourinary:    Comments: No cva tenderness.  Musculoskeletal:        General: No swelling or tenderness.     Cervical back: Normal range of motion and neck supple. No rigidity. No muscular tenderness.     Right lower leg: No edema.     Left lower leg: No edema.  Skin:    General: Skin is warm and dry.     Findings: No rash.  Neurological:     Mental Status: She is alert.     Comments: Alert, speech normal. Motor/sens grossly intact bil. Steady gait.   Psychiatric:        Mood and Affect: Mood normal.     ED Results / Procedures / Treatments   Labs (all labs ordered  are listed, but only abnormal results are displayed) Results for orders placed or performed during the hospital encounter of 03/23/22  CBC  Result Value Ref Range   WBC 5.9 4.0 - 10.5 K/uL   RBC 4.60 3.87 - 5.11 MIL/uL   Hemoglobin 13.0 12.0 - 15.0 g/dL   HCT 39.1 36.0 - 46.0 %   MCV 85.0 80.0 - 100.0 fL  MCH 28.3 26.0 - 34.0 pg   MCHC 33.2 30.0 - 36.0 g/dL   RDW 13.0 11.5 - 15.5 %   Platelets 265 150 - 400 K/uL   nRBC 0.0 0.0 - 0.2 %  Comprehensive metabolic panel  Result Value Ref Range   Sodium 129 (L) 135 - 145 mmol/L   Potassium 5.2 (H) 3.5 - 5.1 mmol/L   Chloride 102 98 - 111 mmol/L   CO2 18 (L) 22 - 32 mmol/L   Glucose, Bld 218 (H) 70 - 99 mg/dL   BUN 25 (H) 8 - 23 mg/dL   Creatinine, Ser 2.01 (H) 0.44 - 1.00 mg/dL   Calcium 9.3 8.9 - 10.3 mg/dL   Total Protein 8.0 6.5 - 8.1 g/dL   Albumin 3.7 3.5 - 5.0 g/dL   AST 33 15 - 41 U/L   ALT 30 0 - 44 U/L   Alkaline Phosphatase 96 38 - 126 U/L   Total Bilirubin 0.4 0.3 - 1.2 mg/dL   GFR, Estimated 25 (L) >60 mL/min   Anion gap 9 5 - 15  Magnesium  Result Value Ref Range   Magnesium 1.8 1.7 - 2.4 mg/dL   DG Chest 2 View  Result Date: 03/16/2022 CLINICAL DATA:  Shortness of breath and cough. EXAM: CHEST - 2 VIEW COMPARISON:  August 02, 2021 FINDINGS: The cardiac silhouette is enlarged. There is no evidence of focal airspace consolidation, pleural effusion or pneumothorax. Osseous structures are without acute abnormality. Soft tissues are grossly normal. IMPRESSION: Enlarged cardiac silhouette. Further evaluation with cardiac echo may be considered. Electronically Signed   By: Fidela Salisbury M.D.   On: 03/16/2022 16:07   CT Cervical Spine Wo Contrast  Result Date: 03/16/2022 CLINICAL DATA:  Neck trauma. EXAM: CT CERVICAL SPINE WITHOUT CONTRAST TECHNIQUE: Multidetector CT imaging of the cervical spine was performed without intravenous contrast. Multiplanar CT image reconstructions were also generated. RADIATION DOSE  REDUCTION: This exam was performed according to the departmental dose-optimization program which includes automated exposure control, adjustment of the mA and/or kV according to patient size and/or use of iterative reconstruction technique. COMPARISON:  None Available. FINDINGS: Alignment: Normal. Skull base and vertebrae: No acute fracture. No primary bone lesion or focal pathologic process. Soft tissues and spinal canal: No prevertebral fluid or swelling. No visible canal hematoma. 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 Upper chest: Negative. Other: None. IMPRESSION: 1. No acute fracture or subluxation of the cervical spine. 2. 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 Electronically Signed   By: Fidela Salisbury M.D.   On: 03/16/2022 16:02   CT Head Wo Contrast  Result Date: 03/16/2022 CLINICAL DATA:  Head trauma. EXAM: CT HEAD WITHOUT CONTRAST TECHNIQUE: Contiguous axial images were obtained from the base of the skull through the vertex without intravenous contrast. RADIATION DOSE REDUCTION: This exam was performed according to the departmental dose-optimization program which includes automated exposure control, adjustment of the mA and/or kV according to patient size and/or use of iterative reconstruction technique. COMPARISON:  March 17, 2021 FINDINGS: Brain: No evidence of acute infarction, hemorrhage, hydrocephalus, extra-axial collection or mass lesion/mass effect. Moderate deep white matter microangiopathy. Mild brain parenchymal volume loss. Vascular: No hyperdense vessel or unexpected calcification. Skull: Normal. Negative for fracture or focal lesion. Sinuses/Orbits: No acute finding. Other: None. IMPRESSION: 1. No acute intracranial abnormality. 2. Moderate deep white matter microangiopathy. 3. Mild brain parenchymal volume loss. Electronically Signed   By: Fidela Salisbury M.D.   On: 03/16/2022 16:00  EKG None  Radiology   Procedures Procedures    Medications Ordered in ED Medications - No data to display  ED Course/ Medical Decision Making/ A&P                           Medical Decision Making Problems Addressed: AKI (acute kidney injury) [facility]): acute illness or injury COVID-19 virus infection: acute illness or injury with systemic symptoms that poses a threat to life or bodily functions Dehydration: acute illness or injury with systemic symptoms that poses a threat to life or bodily functions Essential hypertension: chronic illness or injury  Amount and/or Complexity of Data Reviewed Independent Historian:     Details: Family, hx External Data Reviewed: labs and notes. Labs: ordered. Decision-making details documented in ED Course. Radiology: independent interpretation performed. Decision-making details documented in ED Course.  Risk Decision regarding hospitalization.   Iv ns. Continuous pulse ox and cardiac monitoring. Labs ordered/sent. Imaging ordered.   Diff dx includes covid, dehydration, anemia, etc - dispo decision including potential need for admission considered - will get labs, give fluids,  and reassess.  Reviewed nursing notes and prior charts for additional history. External reports reviewed. Additional history from: family.   Cardiac monitor: sinus rhythm, rate 88.  Labs reviewed/interpreted by me - recent covid test pos.   Recent CT reviewed/interpreted by me - no hem.    Iv ns bolus.   Additional ivf ordered.   Po fluids/food - pt is tolerating well.    RN indicates iv came out after receiving ~ 700 cc ns.    I discussed with pt plan to re-insert iv, additional fluids, etc. Discussed reasons for ivf, including increased kidney function tests and dehydration. Pt voices understanding.  Pt indicates she is feeling much improved, and is doing well w po fluids. Pt requests d/c home, indicating does not want to stay for iv or further  fluids.   Pt denies abd pain or nv. No dysuria or gu c/o.   Pt continues to request d/c.   Rec close f/u, incl recheck labs.  Return precautions provided.          Final Clinical Impression(s) / ED Diagnoses Final diagnoses:  None    Rx / DC Orders ED Discharge Orders     None         Lajean Saver, MD 03/24/22 1109

## 2022-03-23 NOTE — ED Triage Notes (Signed)
Pt to er, pt states that she was here a couple of days ago and was dx with covid, states that since then she has been feeling very fatigued, pt states that she went to urgent care and was told that her mag and k were low and she should go to the er to get some.

## 2022-03-27 NOTE — Telephone Encounter (Signed)
Letter mailed

## 2022-03-29 ENCOUNTER — Ambulatory Visit: Payer: Medicare Other | Admitting: Urology

## 2022-03-29 ENCOUNTER — Other Ambulatory Visit: Payer: Self-pay | Admitting: Nurse Practitioner

## 2022-04-01 IMAGING — DX DG CHEST 2V
2 series · 2 of 2 positions shown · non-contrast
Comparison: Chest x-ray 04/11/2021.

CLINICAL DATA: Dizziness.  Shortness of breath.

EXAM:
CHEST - 2 VIEW

[chest pa]
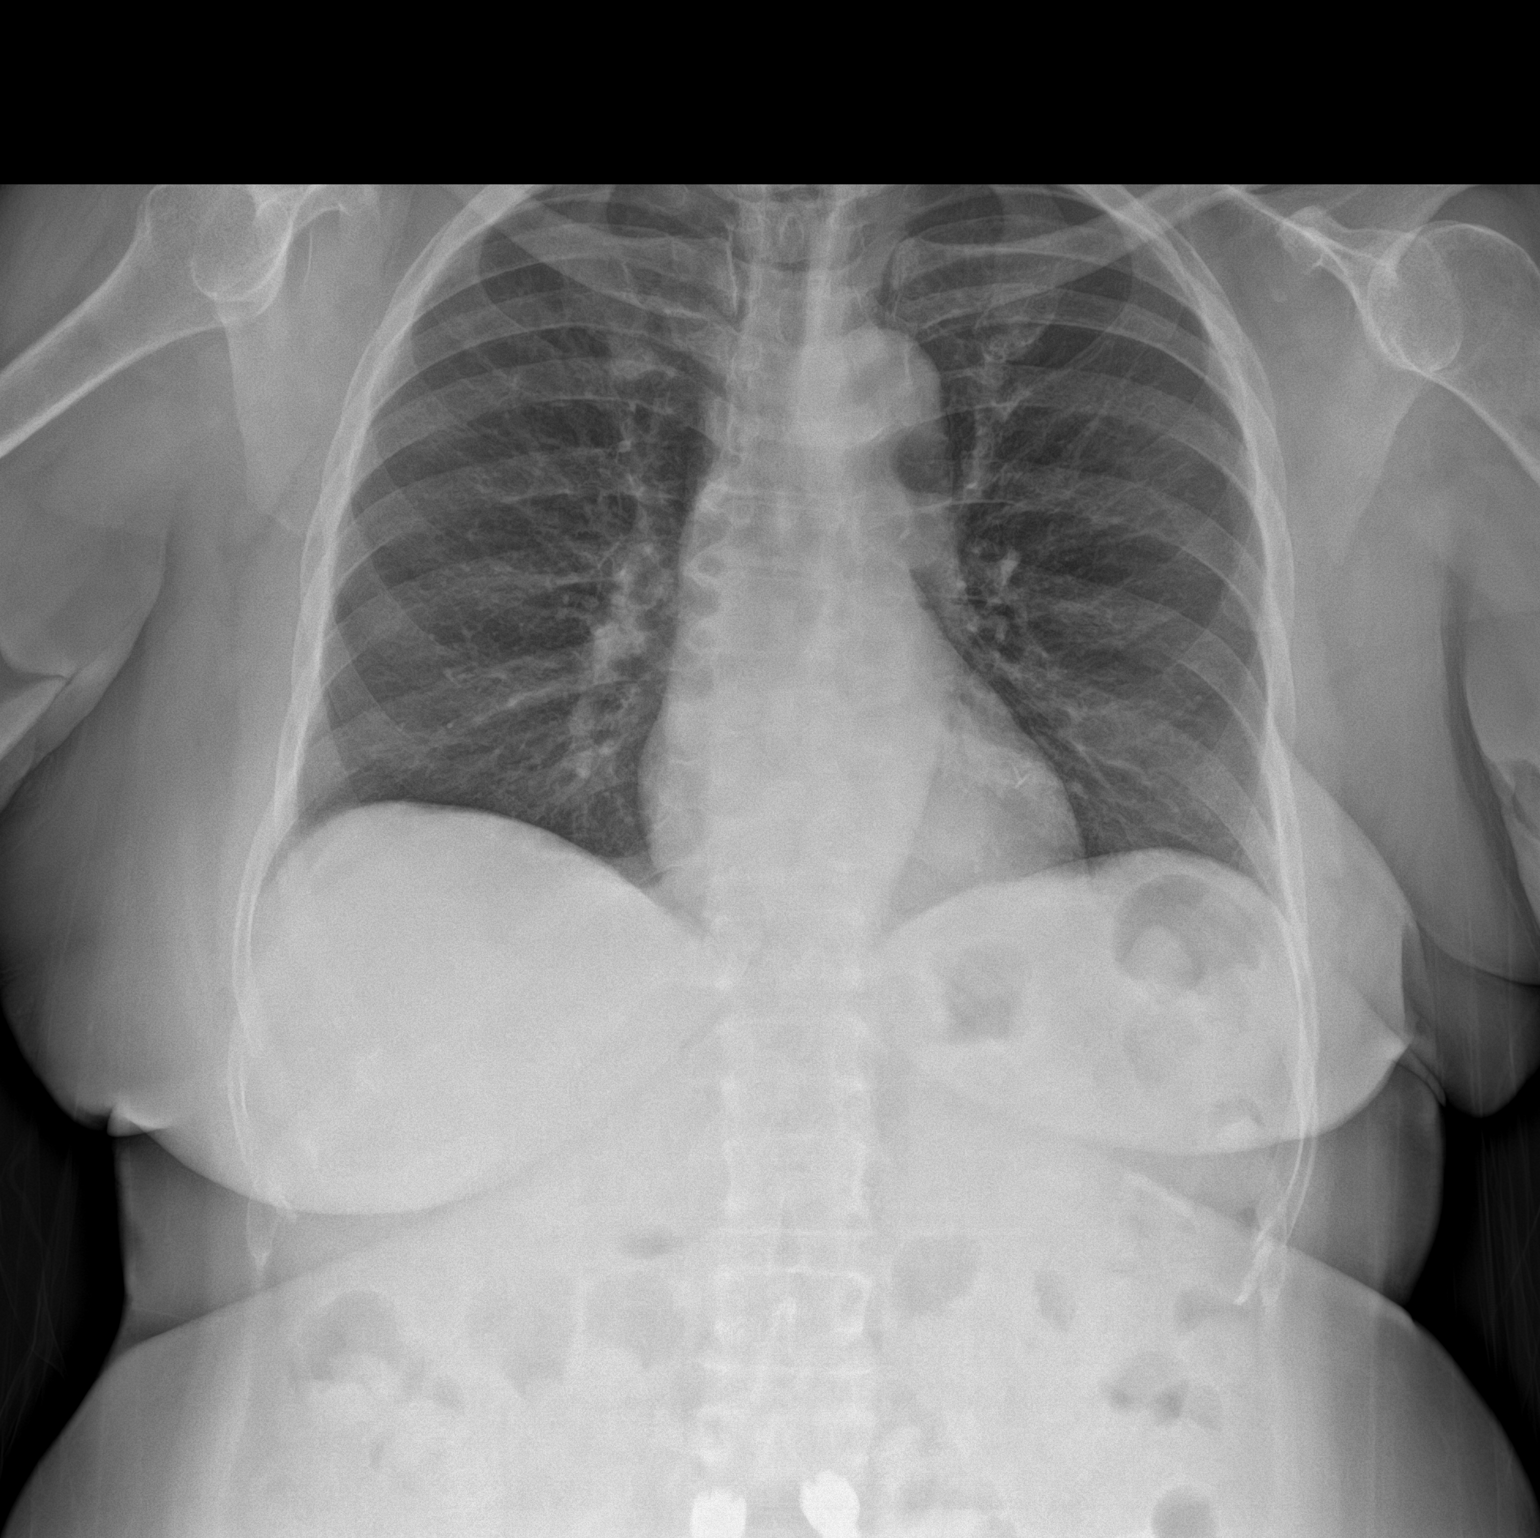

[chest lat]
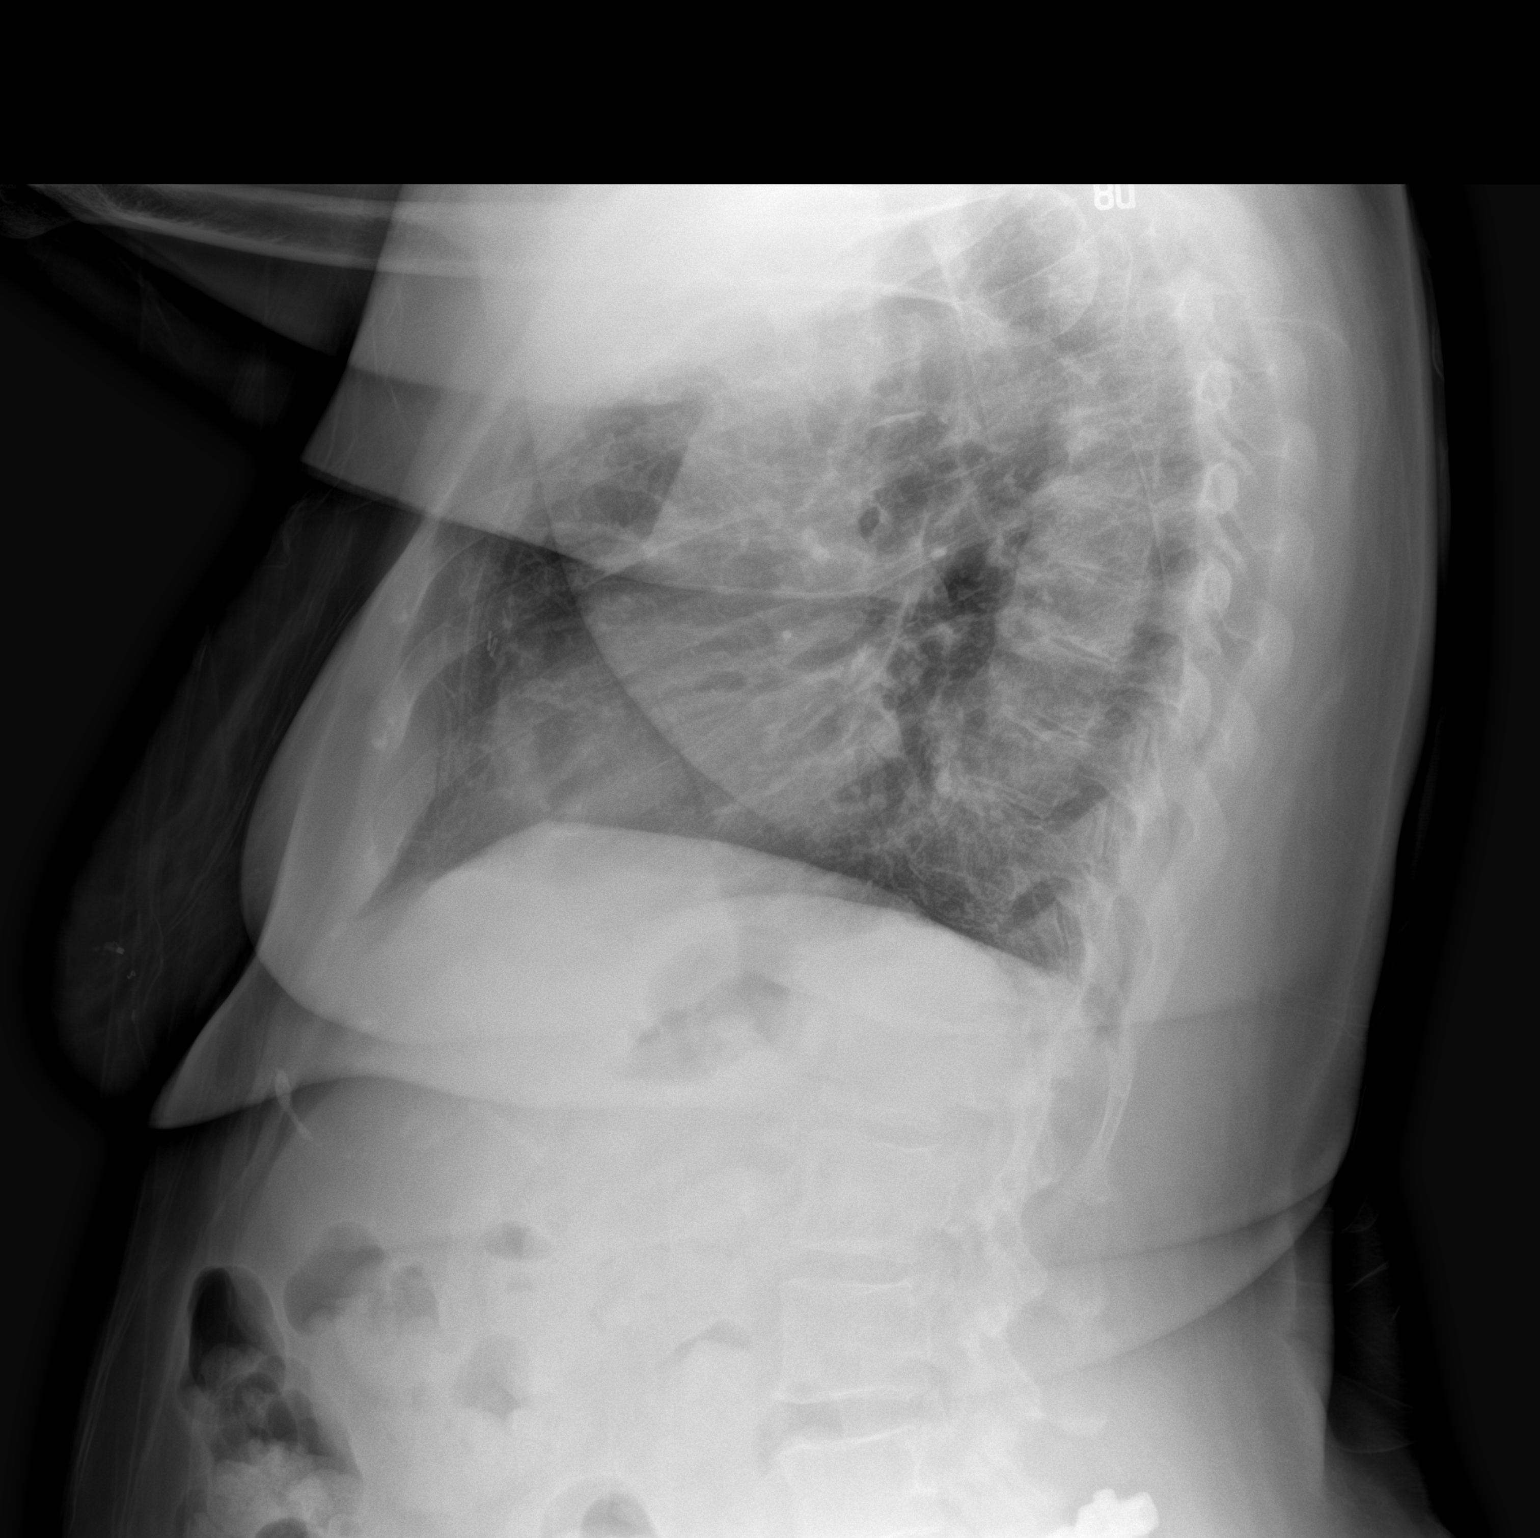

[2 of 2 positions shown; findings below may reference images not displayed]

FINDINGS: Mediastinum hilar structures normal. Heart size normal. Low lung
volumes. No focal infiltrate. No pleural effusion or pneumothorax.
Degenerative changes scoliosis thoracolumbar spine. Postsurgical
changes lower lumbar spine.
IMPRESSION: Low lung volumes.  No acute cardiopulmonary disease.

## 2022-04-03 ENCOUNTER — Encounter: Payer: Self-pay | Admitting: *Deleted

## 2022-04-04 ENCOUNTER — Other Ambulatory Visit: Payer: Self-pay | Admitting: Urology

## 2022-04-04 DIAGNOSIS — N3941 Urge incontinence: Secondary | ICD-10-CM

## 2022-04-05 ENCOUNTER — Ambulatory Visit: Payer: Medicare Other | Admitting: Specialist

## 2022-04-06 ENCOUNTER — Encounter: Payer: Self-pay | Admitting: Specialist

## 2022-04-06 ENCOUNTER — Ambulatory Visit (INDEPENDENT_AMBULATORY_CARE_PROVIDER_SITE_OTHER): Payer: Medicare Other | Admitting: Specialist

## 2022-04-06 VITALS — BP 153/82 | HR 94 | Ht 63.0 in | Wt 180.0 lb

## 2022-04-06 DIAGNOSIS — R29818 Other symptoms and signs involving the nervous system: Secondary | ICD-10-CM

## 2022-04-06 DIAGNOSIS — M5416 Radiculopathy, lumbar region: Secondary | ICD-10-CM

## 2022-04-06 DIAGNOSIS — M4317 Spondylolisthesis, lumbosacral region: Secondary | ICD-10-CM

## 2022-04-06 DIAGNOSIS — M48062 Spinal stenosis, lumbar region with neurogenic claudication: Secondary | ICD-10-CM

## 2022-04-06 DIAGNOSIS — M4726 Other spondylosis with radiculopathy, lumbar region: Secondary | ICD-10-CM | POA: Diagnosis not present

## 2022-04-06 NOTE — Patient Instructions (Signed)
Plan: Avoid bending, stooping and avoid lifting weights greater than 10 lbs. Avoid prolong standing and walking. Avoid frequent bending and stooping  No lifting greater than 10 lbs. May use ice or moist heat for pain. Weight loss is of benefit. Handicap license is approved. Dr. Newton's secretary/Assistant will call to arrange for epidural steroid injection  

## 2022-04-06 NOTE — Progress Notes (Signed)
Office Visit Note   Patient: Stephanie Moreno           Date of Birth: [birth year]           MRN: [id number] Visit Date: 04/06/2022              Requested by: Margretta Sidle, [place],  [place] [id number] PCP: Margretta Sidle, MD   Assessment & Plan: Visit Diagnoses:  1. Neurogenic claudication   2. Lumbar radiculopathy   3. Other spondylosis with radiculopathy, lumbar region   4. Spinal stenosis, lumbar region, with neurogenic claudication   5. 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 Spinal stenosis of lumbar region with neurogenic claudication     Plan: Avoid bending, stooping and avoid lifting weights greater than 10 lbs. Avoid prolong standing and walking. Avoid frequent bending and stooping  No lifting greater than 10 lbs. May use ice or moist heat for pain. Weight loss is of benefit. Handicap license is approved. Consider further ESI if pain worsens. Dr. Romona Curls secretary/Assistant will call to arrange for epidural steroid injection    Follow-Up Instructions: No follow-ups on file.   Orders:  No orders of the defined types were placed in this encounter.  No orders of the defined types were placed in this encounter.     Procedures: No procedures performed   Clinical Data: No additional findings.   Subjective: Chief Complaint  Patient presents with   Lower Back - Follow-up    76 year old female with history of  spinal stenosis and spondylolisthesis. She had COVID recently with a decrease in her kidney function. Was seen in the ER and had IVF given. [place] a couple weeks ago when getting ready to go to the senior center in [place]. She reports she was drenched with sweat. Niece checked on her. Her kidney function was 45% and now it is 25%. There is numbness and tingling into the bottoms of the fee left more than right.  Using a walker for ambulation. Underwent L5-S1 TLIF  with decompression L5-S1 and L4-5 for spinal stenosis and  spondylolisthesis. Had left L4-5 transforaminal ESI with good improvement in pain pattern.   Review of Systems  Constitutional: Negative.   HENT: Negative.    Eyes: Negative.   Respiratory: Negative.    Cardiovascular: Negative.   Gastrointestinal: Negative.   Endocrine: Negative.   Genitourinary: Negative.   Musculoskeletal: Negative.   Skin: Negative.   Allergic/Immunologic: Negative.   Neurological: Negative.   Hematological: Negative.   Psychiatric/Behavioral: Negative.       Objective: Vital Signs: BP (!) 153/82   Pulse 94   Ht '5\' 3"'$  (1.6 m)   Wt 180 lb (81.6 kg)   BMI 31.89 kg/m   Physical Exam Constitutional:      Appearance: She is well-developed.  HENT:     Head: Normocephalic and atraumatic.  Eyes:     Pupils: Pupils are equal, round, and reactive to light.  Pulmonary:     Effort: Pulmonary effort is normal.     Breath sounds: Normal breath sounds.  Abdominal:     General: Bowel sounds are normal.     Palpations: Abdomen is soft.  Musculoskeletal:     Cervical back: Normal range of motion and neck supple.  Skin:    General: Skin is warm and dry.  Neurological:     Mental Status: She is alert and oriented to person, place, and time.  Psychiatric:  Behavior: Behavior normal.        Thought Content: Thought content normal.        Judgment: Judgment normal.   Back Exam   Tenderness  The patient is experiencing tenderness in the lumbar.  Range of Motion  Extension:  abnormal  Flexion:  abnormal  Lateral bend right:  abnormal  Lateral bend left:  abnormal  Rotation right:  abnormal  Rotation left:  abnormal   Muscle Strength  Right Quadriceps:  5/5  Left Quadriceps:  4/5  Right Hamstrings:  5/5  Left Hamstrings:  5/5   Reflexes  Patellar:  0/4 Achilles:  0/4  Other  Toe walk: normal Heel walk: normal Sensation: normal Gait: normal   Comments:  SLR is negative. Motor without focal ceff    Specialty Comments:  MRI LUMBAR  SPINE WITHOUT AND WITH CONTRAST   TECHNIQUE: Multiplanar and multiecho pulse sequences of the lumbar spine were obtained without and with intravenous contrast.   CONTRAST:  5m MULTIHANCE GADOBENATE DIMEGLUMINE 529 MG/ML IV SOLN   COMPARISON:  Lumbar MRI 10/22/2020   FINDINGS: Segmentation:  5 lumbar vertebra   Alignment:  Normal   Vertebrae: Normal bone marrow. Negative for fracture or mass. No evidence of spinal infection.   Conus medullaris and cauda equina: Conus extends to the L2-3 level. Conus and cauda equina appear normal.   Paraspinal and other soft tissues: Bilateral renal cysts. No paraspinous mass, adenopathy, or fluid collection   Disc levels:   L1-2: Negative   L2-3: Negative   L3-4: Mild facet degeneration. Negative for disc protrusion or stenosis   L4-5: Shallow central disc protrusion unchanged. Bilateral facet hypertrophy. Mild spinal stenosis improved likely due to laminotomy. Mild subarticular stenosis bilaterally.   L5-S1: Pedicle screw and interbody fusion. Negative for spinal or foraminal stenosis.   IMPRESSION: 1. 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 Interval pedicle screw and interbody fusion L5-S1. Negative for stenosis.     Electronically Signed   By: CFranchot GalloM.D.   On: 10/11/2021 14:03  Imaging: No results found.   PMFS History: Patient Active Problem List   Diagnosis Date Noted   Urge incontinence 07/26/2021   Urinary retention 04/15/2021   [place] ([place] 04/12/2021   Acute renal failure superimposed on chronic kidney disease, unspecified CKD stage, unspecified acute renal failure type 04/12/2021   Acute renal failure superimposed on stage 3b chronic kidney disease ([place] 04/12/2021   Dehydration    Spondylolisthesis, lumbar region    Spinal stenosis, lumbar region, with neurogenic claudication    Dysphagia 03/18/2021   Speech disturbance 03/18/2021   Wheezing 03/18/2021    Ileus ([place] 03/18/2021   Fusion of spine of lumbar region 03/15/2021   Cerebrovascular disease 12/20/2020   GERD (gastroesophageal reflux disease) 11/18/2020   Type 2 diabetes mellitus with stage 3b chronic kidney disease, without long-term current use of insulin ([place] 07/12/2020   Mixed hyperlipidemia 07/12/2020   Essential hypertension, benign 07/12/2020   Iron deficiency anemia    Loss of weight 04/09/2020   Dyspepsia 01/28/2020   Constipation 01/28/2020   Lower abdominal pain 01/28/2020   Normocytic anemia 01/28/2020   Past Medical History:  Diagnosis Date   Acid reflux disease    Anemia    Anxiety    Asthma    Breast cancer ([place]    left side   Chronic back pain    Chronic kidney disease    Depression    Diabetes mellitus  without complication ([place])    Diverticulitis    Hypertension    Lumbosacral spondylosis    Per previous PCP records   Sleep apnea    Thyroid disease     Family History  Problem Relation Age of Onset   Diabetes Mother    Thyroid disease Mother    Colon cancer Neg Hx     Past Surgical History:  Procedure Laterality Date   ABDOMINAL HYSTERECTOMY     BACK SURGERY     BIOPSY  02/19/2020   Procedure: BIOPSY;  Surgeon: Eloise Harman, DO;  Location: [facility];  Service: Endoscopy;;   BREAST LUMPECTOMY Left 2003   BREAST SURGERY     COLONOSCOPY WITH PROPOFOL N/A 02/19/2020   non-bleeding internal hemorrhoids, many small-mouthed diverticula in entire colon.   ESOPHAGOGASTRODUODENOSCOPY (EGD) WITH PROPOFOL N/A 02/19/2020    benign-appearing esophageal stenosis s/p dilation, gastritis s/p biopsy, normal duodenum. Negative H.pylori, negative duodenal biopsy.    GIVENS CAPSULE STUDY N/A 06/14/2020   Procedure: GIVENS CAPSULE STUDY;  Surgeon: Eloise Harman, DO;  Location: [facility];  Service: Endoscopy;  Laterality: N/A;  7:30am   THYROIDECTOMY     tummy tuck     at time of breast surgery 2003   Social History   Occupational  History   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 [place]  Tobacco Use   Smoking status: Never   Smokeless tobacco: Never  Vaping Use   Vaping Use: Never used  Substance and Sexual Activity   Alcohol use: Not Currently   Drug use: Not Currently   Sexual activity: Not on file

## 2022-04-07 IMAGING — MG MM DIGITAL SCREENING BILAT W/ TOMO AND CAD
8 series · 8 of 24 positions shown · non-contrast
Comparison: Previous exam(s).

CLINICAL DATA: Screening.

EXAM:
DIGITAL SCREENING BILATERAL MAMMOGRAM WITH TOMOSYNTHESIS AND CAD
TECHNIQUE: Bilateral screening digital craniocaudal and mediolateral oblique
mammograms were obtained. Bilateral screening digital breast
tomosynthesis was performed. The images were evaluated with
computer-aided detection.

[L MLO synth-2D]
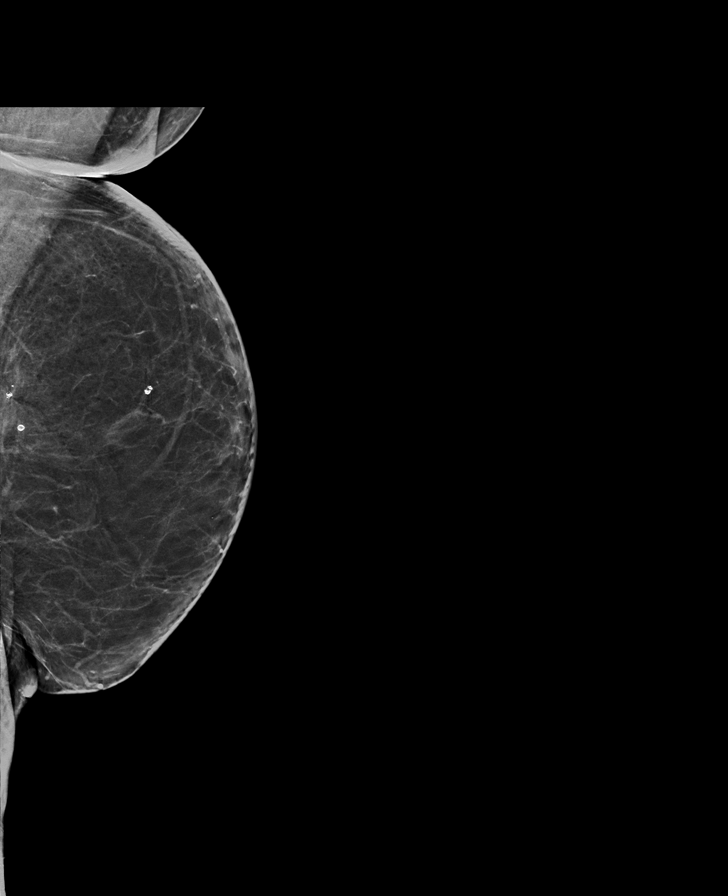

[R CC synth-2D]
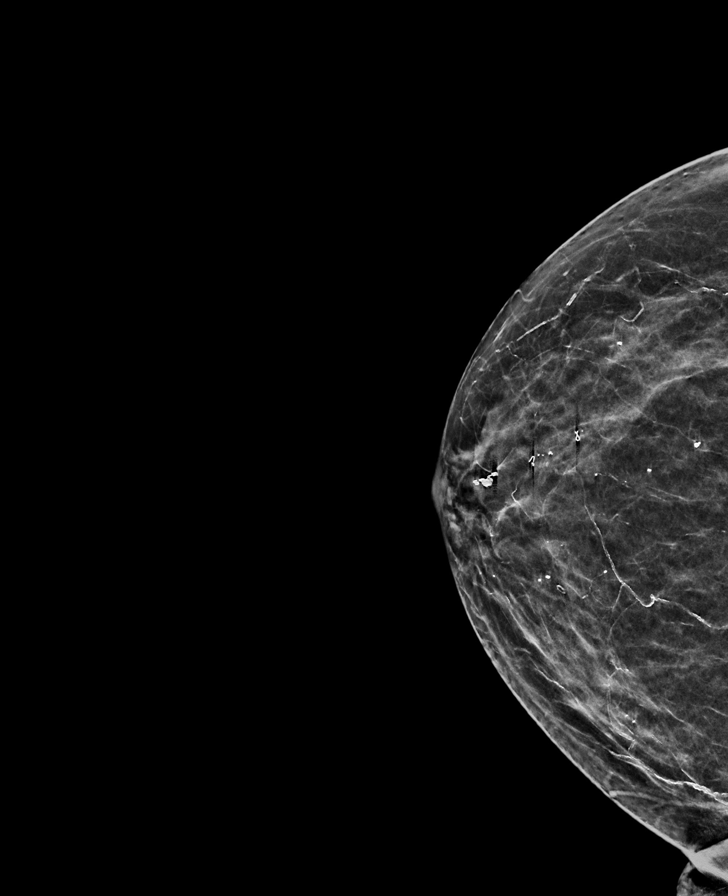

[L CC synth-2D]
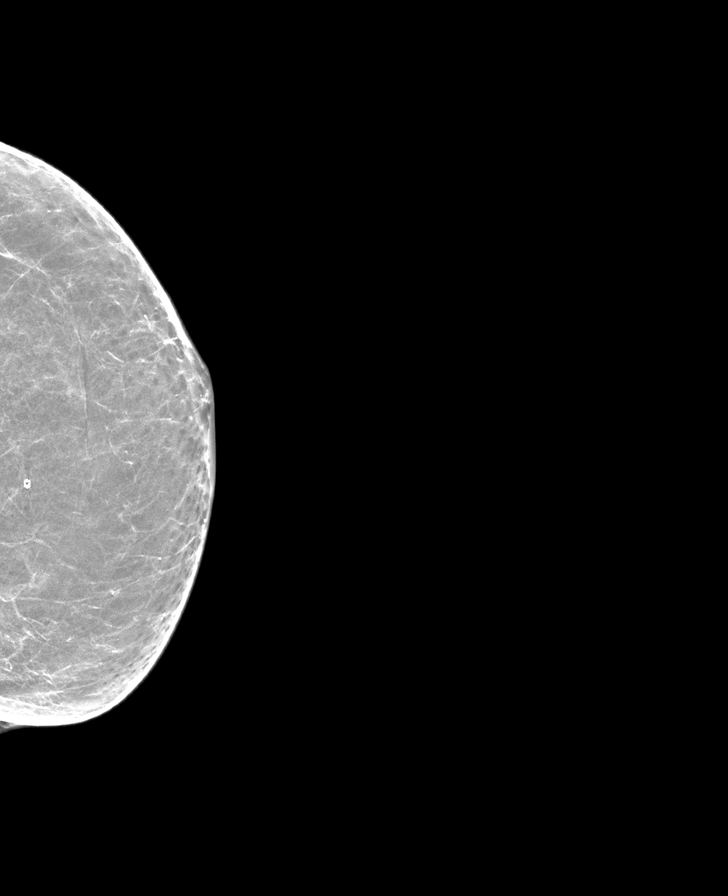

[R MLO synth-2D]
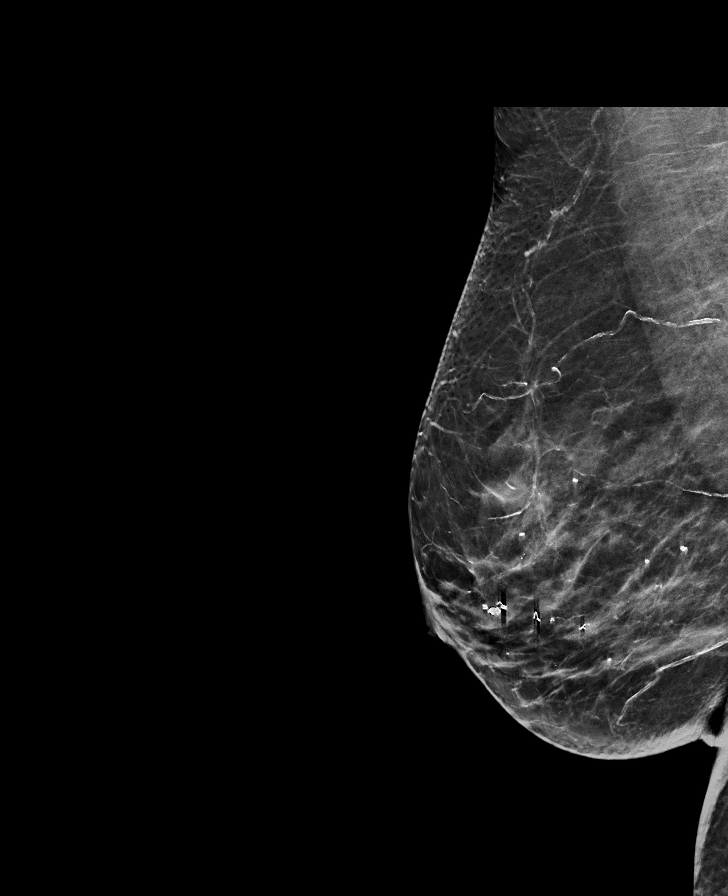

[R CC tomo · tomo slice 26/51.0]
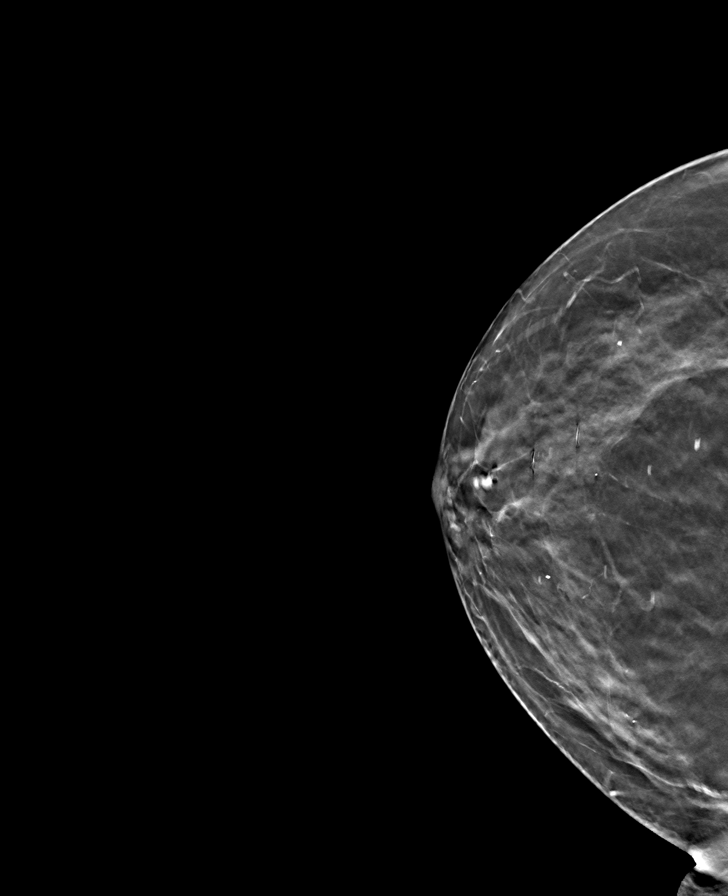

[R MLO tomo · tomo slice 34/67.0]
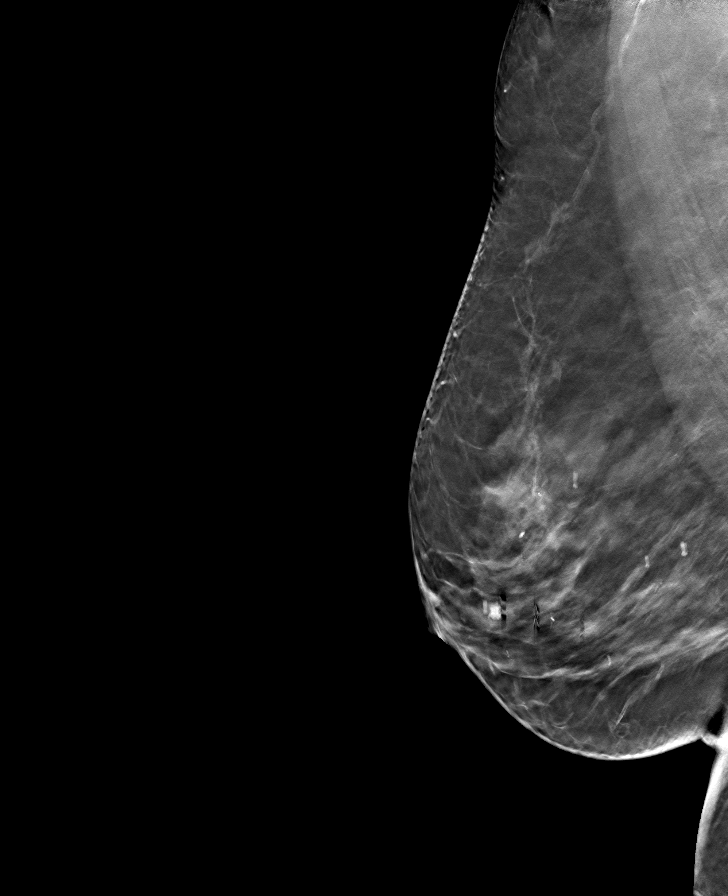

[L MLO tomo · tomo slice 32/63.0]
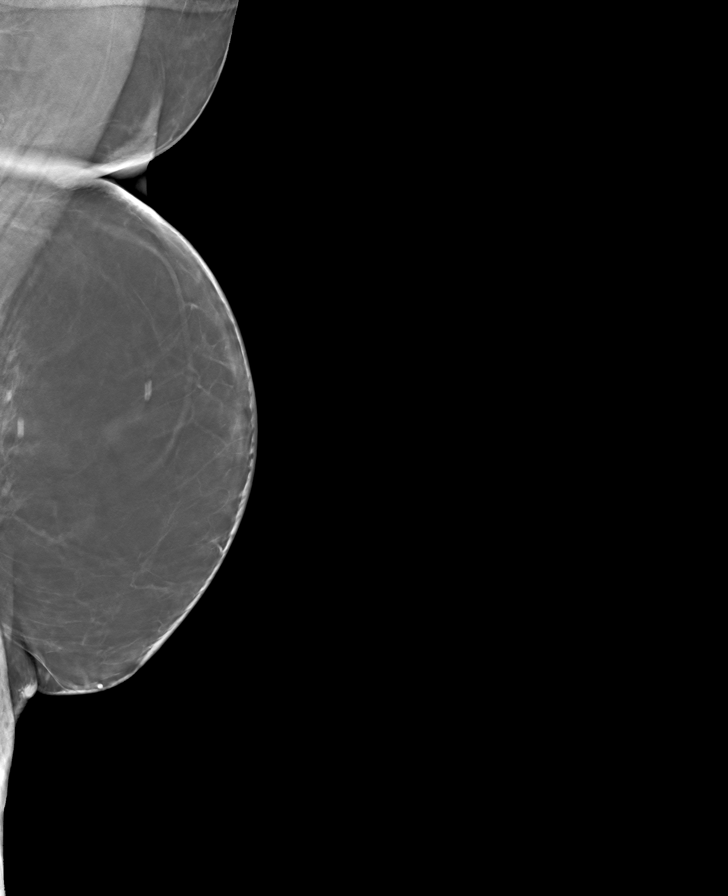

[L CC tomo · tomo slice 25/50.0]
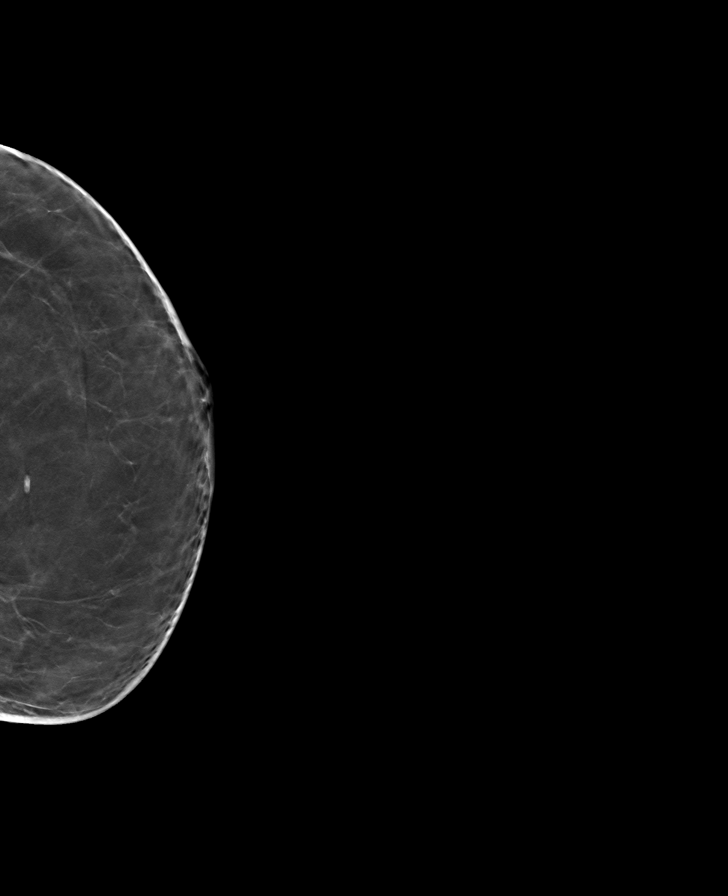

[8 of 24 positions shown; findings below may reference images not displayed]

ACR Breast Density Category b: There are scattered areas of
fibroglandular density.
FINDINGS: There are no findings suspicious for malignancy.
IMPRESSION: No mammographic evidence of malignancy. A result letter of this
screening mammogram will be mailed directly to the patient.

RECOMMENDATION:
Screening mammogram in one year. (Code:51-O-LD2)

BI-RADS CATEGORY  1: Negative.

## 2022-04-12 ENCOUNTER — Ambulatory Visit (INDEPENDENT_AMBULATORY_CARE_PROVIDER_SITE_OTHER): Payer: Medicare Other | Admitting: Urology

## 2022-04-12 ENCOUNTER — Encounter: Payer: Self-pay | Admitting: Urology

## 2022-04-12 VITALS — BP 174/84 | HR 97 | Ht 63.0 in | Wt 177.0 lb

## 2022-04-12 DIAGNOSIS — N3941 Urge incontinence: Secondary | ICD-10-CM

## 2022-04-12 DIAGNOSIS — Z87440 Personal history of urinary (tract) infections: Secondary | ICD-10-CM | POA: Diagnosis not present

## 2022-04-12 MED ORDER — MIRABEGRON ER 50 MG PO TB24: ORAL_TABLET | ORAL | 2 refills | Status: DC

## 2022-04-12 NOTE — Progress Notes (Signed)
Assessment: 1. Urge incontinence   2. History of UTI     Plan: Continue Myrbetriq 50 mg daily.  Return to office in 6 months  Chief Complaint:  Chief Complaint  Patient presents with   Urinary Incontinence    History of Present Illness:  Stephanie Moreno is a 76 y.o. year old female who is seen for further evaluation of urge incontinence, history of urinary retention, and history of UTI.   She was admitted to the hospital on 04/11/2021 for generalized weakness.  She underwent a lumbar laminectomy in September 2022.  After returning home she developed significant weakness.  She was admitted with severe dehydration and acute on chronic renal insufficiency.  Urinalysis showed positive nitrite, >50 RBCs, >50 WBCs, and many bacteria.  Urine culture grew multiple species.  She did have problems with urinary retention while in the hospital.  She required in and out catheterization and eventually Foley catheter was placed prior to discharge. No gross hematuria. No prior history of urinary retention.  She denies any problems voiding prior to her hospitalization. She does report some problems with constipation.  Her Foley was removed on 04/28/2021.   She began voiding spontaneously following catheter removal.  A urine culture from 05/05/2021 grew >100 K ESBL.  She was started on Cipro on 05/09/2021.   She reported some episodes of incontinence.  No fevers or chills.  No gross hematuria. Urinalysis from 07/07/2021 showed no WBCs, no RBCs, no bacteria.  Urine culture grew <10K mixed flora.  At her visit in 1/23, she was voiding spontaneously.  She reported urgency and urge incontinence, occurring both daytime and nighttime.  She was using 2-3 adult pull-ups per day.  She noted a slight discomfort with voiding.  No gross hematuria. PVR = 2 ml She had been on Gemtesa for approximately 2 months. She was given a trial of Myrbetriq 50 mg daily in 1/23.  At her visit in 2/23, she had noted improvement  in her symptoms with Myrbetriq.  She reported decreased urgency and incontinence.  Her nocturia had improved as well.  No side effects from the medication.  No dysuria or gross hematuria. She was continued on Myrbetriq 50 mg daily. She was seen in urgent care for increased frequency and incontinence.  Urinalysis was unremarkable.  At her visit in 5/23, she continued on Myrbetriq 50 mg daily.  She had some recent increase in her urinary symptoms associated with some increased blood glucose levels as well as increased low back pain.  She received an epidural injection for the back pain. She felt like her urinary symptoms were fairly well controlled.  She reported occasional episodes of urinary incontinence associated with urgency every other day and nocturia x1.  No dysuria or gross hematuria. PVR = 0 ml At her visit in 6/23, she continued on Myrbetriq 50 mg daily. Her urinary symptoms were stable.  She continued to have occasional episodes of incontinence.  She continued to have problems with her back as well as control of her diabetes.  No dysuria or gross hematuria.  She returns today for follow-up.  She continues on Myrbetriq 50 mg daily.  Her incontinence is well controlled.  She has an occasional incontinence episode.  Her pad use has decreased to 2 pads per day.  No dysuria or gross hematuria.  Portions of the above documentation were copied from a prior visit for review purposes only.   Past Medical History:  Past Medical History:  Diagnosis Date  Acid reflux disease    Anemia    Anxiety    Asthma    Breast cancer ([place])    left side   Chronic back pain    Chronic kidney disease    Depression    Diabetes mellitus without complication ([place])    Diverticulitis    Hypertension    Lumbosacral spondylosis    Per previous PCP records   Sleep apnea    Thyroid disease     Past Surgical History:  Past Surgical History:  Procedure Laterality Date   ABDOMINAL HYSTERECTOMY     BACK  SURGERY     BIOPSY  02/19/2020   Procedure: BIOPSY;  Surgeon: Eloise Harman, DO;  Location: [facility];  Service: Endoscopy;;   BREAST LUMPECTOMY Left 2003   BREAST SURGERY     COLONOSCOPY WITH PROPOFOL N/A 02/19/2020   non-bleeding internal hemorrhoids, many small-mouthed diverticula in entire colon.   ESOPHAGOGASTRODUODENOSCOPY (EGD) WITH PROPOFOL N/A 02/19/2020    benign-appearing esophageal stenosis s/p dilation, gastritis s/p biopsy, normal duodenum. Negative H.pylori, negative duodenal biopsy.    GIVENS CAPSULE STUDY N/A 06/14/2020   Procedure: GIVENS CAPSULE STUDY;  Surgeon: Eloise Harman, DO;  Location: [facility];  Service: Endoscopy;  Laterality: N/A;  7:30am   THYROIDECTOMY     tummy tuck     at time of breast surgery 2003    Allergies:  Allergies  Allergen Reactions   Codeine Nausea And Vomiting and Anaphylaxis   Carvedilol     Cold sweats, diarrhea, severe hot flashes   Hydrochlorothiazide Other (See Comments)    Other reaction(s): stomach upset   Lisinopril Nausea And Vomiting    Other reaction(s): cough   Oxybutynin Other (See Comments)   Penicillins Hives    Family History:  Family History  Problem Relation Age of Onset   Diabetes Mother    Thyroid disease Mother    Colon cancer Neg Hx     Social History:  Social History   Tobacco Use   Smoking status: Never   Smokeless tobacco: Never  Vaping Use   Vaping Use: Never used  Substance Use Topics   Alcohol use: Not Currently   Drug use: Not Currently   ROS: Constitutional:  Negative for fever, chills, weight loss CV: Negative for chest pain, previous MI, hypertension Respiratory:  Negative for shortness of breath, wheezing, sleep apnea, frequent cough GI:  Negative for nausea, vomiting, bloody stool, GERD  Physical exam: BP (!) 174/84   Pulse 97   Ht '5\' 3"'$  (1.6 m)   Wt 177 lb (80.3 kg)   BMI 31.35 kg/m  GENERAL APPEARANCE:  Well appearing, well developed, well nourished,  NAD HEENT:  Atraumatic, normocephalic, oropharynx clear NECK:  Supple without lymphadenopathy or thyromegaly ABDOMEN:  Soft, non-tender, no masses EXTREMITIES:  Moves all extremities well, without clubbing, cyanosis, or edema NEUROLOGIC:  Alert and oriented x 3, normal gait, CN II-XII grossly intact MENTAL STATUS:  appropriate BACK:  Non-tender to palpation, No CVAT SKIN:  Warm, dry, and intact  Results: U/A: 0-5 WBC, 0-2 RBC, few bacteria

## 2022-04-13 LAB — MICROSCOPIC EXAMINATION

## 2022-04-13 LAB — URINALYSIS, ROUTINE W REFLEX MICROSCOPIC
Bilirubin, UA: NEGATIVE
Glucose, UA: NEGATIVE
Ketones, UA: NEGATIVE
Nitrite, UA: NEGATIVE
Specific Gravity, UA: 1.005 (ref 1.005–1.030)
Urobilinogen, Ur: 0.2 mg/dL (ref 0.2–1.0)
pH, UA: 5.5 (ref 5.0–7.5)

## 2022-04-17 NOTE — Patient Instructions (Signed)
AMYRI FRENZ  04/17/2022     '@PREFPERIOPPHARMACY'$ @   Your procedure is scheduled on  04/24/2022.   Report to [facility] at  1115  A.M.   Call this number if you have problems the morning of surgery:  [phone]  If you experience any cold or flu symptoms such as cough, fever, chills, shortness of breath, etc. between now and your scheduled surgery, please notify us at the above number.   Remember:  Follow the diet instructions given to you by the office.      Your last dose of trulicity should be 12/87 or before.      Use your inhaler before you come and bring your rescue inhaler with you.      Take 25 units of your tresiba the night before your procedure.      DO NOT take any medications for diabetes the morning of your procedure.     Take these medicines the morning of surgery with A SIP OF WATER                             amlodipine, pantoprazole.     Do not wear jewelry, make-up or nail polish.  Do not wear lotions, powders, or perfumes, or deodorant.  Do not shave 48 hours prior to surgery.  Men may shave face and neck.  Do not bring valuables to the hospital.  [facility] is not responsible for any belongings or valuables.  Contacts, dentures or bridgework may not be worn into surgery.  Leave your suitcase in the car.  After surgery it may be brought to your room.  For patients admitted to the hospital, discharge time will be determined by your treatment team.  Patients discharged the day of surgery will not be allowed to drive home and must have someone with them for 24 hours.    Special instructions:   DO NOT smoke tobacco or vape for 24 hours before your procedure.   Please read over the following fact sheets that you were given. Anesthesia Post-op Instructions and Care and Recovery After Surgery      Upper Endoscopy, Adult, Care After After the procedure, it is common to have a sore throat. It is also common to have: Mild stomach pain  or discomfort. Bloating. Nausea. Follow these instructions at home: The instructions below may help you care for yourself at home. Your health care provider may give you more instructions. If you have questions, ask your health care provider. If you were given a sedative during the procedure, it can affect you for several hours. Do not drive or operate machinery until your health care provider says that it is safe. If you will be going home right after the procedure, plan to have a responsible adult: Take you home from the hospital or clinic. You will not be allowed to drive. Care for you for the time you are told. Follow instructions from your health care provider about what you may eat and drink. Return to your normal activities as told by your health care provider. Ask your health care provider what activities are safe for you. Take over-the-counter and prescription medicines only as told by your health care provider. Contact a health care provider if you: Have a sore throat that lasts longer than one day. Have trouble swallowing. Have a fever. Get help right away if you: Vomit blood or your vomit looks  like coffee grounds. Have bloody, black, or tarry stools. Have a very bad sore throat or you cannot swallow. Have difficulty breathing or very bad pain in your chest or abdomen. These symptoms may be an emergency. Get help right away. Call 911. Do not wait to see if the symptoms will go away. Do not drive yourself to the hospital. Summary After the procedure, it is common to have a sore throat, mild stomach discomfort, bloating, and nausea. If you were given a sedative during the procedure, it can affect you for several hours. Do not drive until your health care provider says that it is safe. Follow instructions from your health care provider about what you may eat and drink. Return to your normal activities as told by your health care provider. This information is not intended to  replace advice given to you by your health care provider. Make sure you discuss any questions you have with your health care provider. Document Revised: 09/28/2021 Document Reviewed: 09/28/2021 Elsevier Patient Education  [place]. Esophageal Dilatation Esophageal dilatation, also called esophageal dilation, is a procedure to widen or open a blocked or narrowed part of the esophagus. The esophagus is the part of the body that moves food and liquid from the mouth to the stomach. You may need this procedure if: You have a buildup of scar tissue in your esophagus that makes it difficult, painful, or impossible to swallow. This can be caused by gastroesophageal reflux disease (GERD). You have cancer of the esophagus. There is a problem with how food moves through your esophagus. In some cases, you may need this procedure repeated at a later time to dilate the esophagus gradually. Tell a health care provider about: Any allergies you have. All medicines you are taking, including vitamins, herbs, eye drops, creams, and over-the-counter medicines. Any problems you or family members have had with anesthetic medicines. Any blood disorders you have. Any surgeries you have had. Any medical conditions you have. Any antibiotic medicines you are required to take before dental procedures. Whether you are pregnant or may be pregnant. What are the risks? Generally, this is a safe procedure. However, problems may occur, including: Bleeding due to a tear in the lining of the esophagus. A hole, or perforation, in the esophagus. What happens before the procedure? Ask your health care provider about: Changing or stopping your regular medicines. This is especially important if you are taking diabetes medicines or blood thinners. Taking medicines such as aspirin and ibuprofen. These medicines can thin your blood. Do not take these medicines unless your health care provider tells you to take them. Taking  over-the-counter medicines, vitamins, herbs, and supplements. Follow instructions from your health care provider about eating or drinking restrictions. Plan to have a responsible adult take you home from the hospital or clinic. Plan to have a responsible adult care for you for the time you are told after you leave the hospital or clinic. This is important. What happens during the procedure? You may be given a medicine to help you relax (sedative). A numbing medicine may be sprayed into the back of your throat, or you may gargle the medicine. Your health care provider may perform the dilatation using various surgical instruments, such as: Simple dilators. This instrument is carefully placed in the esophagus to stretch it. Guided wire bougies. This involves using an endoscope to insert a wire into the esophagus. A dilator is passed over this wire to enlarge the esophagus. Then the wire is removed. Balloon  dilators. An endoscope with a small balloon is inserted into the esophagus. The balloon is inflated to stretch the esophagus and open it up. The procedure may vary among health care providers and hospitals. What can I expect after the procedure? Your blood pressure, heart rate, breathing rate, and blood oxygen level will be monitored until you leave the hospital or clinic. Your throat may feel slightly sore and numb. This will get better over time. You will not be allowed to eat or drink until your throat is no longer numb. When you are able to drink, urinate, and sit on the edge of the bed without nausea or dizziness, you may be able to return home. Follow these instructions at home: Take over-the-counter and prescription medicines only as told by your health care provider. If you were given a sedative during the procedure, it can affect you for several hours. Do not drive or operate machinery until your health care provider says that it is safe. Plan to have a responsible adult care for you for  the time you are told. This is important. Follow instructions from your health care provider about any eating or drinking restrictions. Do not use any products that contain nicotine or tobacco, such as cigarettes, e-cigarettes, and chewing tobacco. If you need help quitting, ask your health care provider. Keep all follow-up visits. This is important. Contact a health care provider if: You have a fever. You have pain that is not relieved by medicine. Get help right away if: You have chest pain. You have trouble breathing. You have trouble swallowing. You vomit blood. You have black, tarry, or bloody stools. These symptoms may represent a serious problem that is an emergency. Do not wait to see if the symptoms will go away. Get medical help right away. Call your local emergency services (911 in the [place] Do not drive yourself to the hospital. Summary Esophageal dilatation, also called esophageal dilation, is a procedure to widen or open a blocked or narrowed part of the esophagus. Plan to have a responsible adult take you home from the hospital or clinic. For this procedure, a numbing medicine may be sprayed into the back of your throat, or you may gargle the medicine. Do not drive or operate machinery until your health care provider says that it is safe. This information is not intended to replace advice given to you by your health care provider. Make sure you discuss any questions you have with your health care provider. Document Revised: 11/05/2019 Document Reviewed: 11/05/2019 Elsevier Patient Education  [place] After This sheet gives you information about how to care for yourself after your procedure. Your health care provider may also give you more specific instructions. If you have problems or questions, contact your health care provider. What can I expect after the procedure? After the procedure, it is common to  have: Tiredness. Forgetfulness about what happened after the procedure. Impaired judgment for important decisions. Nausea or vomiting. Some difficulty with balance. Follow these instructions at home: For the time period you were told by your health care provider:     Rest as needed. Do not participate in activities where you could fall or become injured. Do not drive or use machinery. Do not drink alcohol. Do not take sleeping pills or medicines that cause drowsiness. Do not make important decisions or sign legal documents. Do not take care of children on your own. Eating and drinking Follow the diet that is recommended by your  health care provider. Drink enough fluid to keep your urine pale yellow. If you vomit: Drink water, juice, or soup when you can drink without vomiting. Make sure you have little or no nausea before eating solid foods. General instructions Have a responsible adult stay with you for the time you are told. It is important to have someone help care for you until you are awake and alert. Take over-the-counter and prescription medicines only as told by your health care provider. If you have sleep apnea, surgery and certain medicines can increase your risk for breathing problems. Follow instructions from your health care provider about wearing your sleep device: Anytime you are sleeping, including during daytime naps. While taking prescription pain medicines, sleeping medicines, or medicines that make you drowsy. Avoid smoking. Keep all follow-up visits as told by your health care provider. This is important. Contact a health care provider if: You keep feeling nauseous or you keep vomiting. You feel light-headed. You are still sleepy or having trouble with balance after 24 hours. You develop a rash. You have a fever. You have redness or swelling around the IV site. Get help right away if: You have trouble breathing. You have new-onset confusion at  home. Summary For several hours after your procedure, you may feel tired. You may also be forgetful and have poor judgment. Have a responsible adult stay with you for the time you are told. It is important to have someone help care for you until you are awake and alert. Rest as told. Do not drive or operate machinery. Do not drink alcohol or take sleeping pills. Get help right away if you have trouble breathing, or if you suddenly become confused. This information is not intended to replace advice given to you by your health care provider. Make sure you discuss any questions you have with your health care provider. Document Revised: 05/24/2021 Document Reviewed: 05/22/2019 Elsevier Patient Education  [place].

## 2022-04-19 ENCOUNTER — Encounter (HOSPITAL_COMMUNITY)
Admission: RE | Admit: 2022-04-19 | Discharge: 2022-04-19 | Disposition: A | Discharge status: Home / Self Care | Payer: Medicare Other | Source: Ambulatory Visit | Attending: Internal Medicine | Admitting: Internal Medicine

## 2022-04-19 ENCOUNTER — Other Ambulatory Visit: Payer: Self-pay | Admitting: Gastroenterology

## 2022-04-19 VITALS — BP 144/71 | HR 78 | Temp 97.6°F | Resp 18 | Ht 63.0 in | Wt 177.0 lb

## 2022-04-19 DIAGNOSIS — E1122 Type 2 diabetes mellitus with diabetic chronic kidney disease: Secondary | ICD-10-CM | POA: Diagnosis present

## 2022-04-19 DIAGNOSIS — N1832 Chronic kidney disease, stage 3b: Secondary | ICD-10-CM | POA: Diagnosis not present

## 2022-04-19 LAB — BASIC METABOLIC PANEL
Anion gap: 10 (ref 5–15)
BUN: 21 mg/dL (ref 8–23)
CO2: 24 mmol/L (ref 22–32)
Calcium: 9 mg/dL (ref 8.9–10.3)
Chloride: 105 mmol/L (ref 98–111)
Creatinine, Ser: 1.53 mg/dL — ABNORMAL HIGH (ref 0.44–1.00)
GFR, Estimated: 35 mL/min — ABNORMAL LOW (ref >60)
Glucose, Bld: 163 mg/dL — ABNORMAL HIGH (ref 70–99)
Potassium: 2.7 mmol/L — CL (ref 3.5–5.1)
Sodium: 139 mmol/L (ref 135–145)

## 2022-04-19 MED ORDER — POTASSIUM CHLORIDE CRYS ER 20 MEQ PO TBCR: 40.0000 meq | EXTENDED_RELEASE_TABLET | Freq: Three times a day (TID) | ORAL | 0 refills | Status: DC

## 2022-04-19 NOTE — Progress Notes (Signed)
Potassium sent to pharmacy 

## 2022-04-20 ENCOUNTER — Encounter: Payer: Self-pay | Admitting: Nurse Practitioner

## 2022-04-20 ENCOUNTER — Ambulatory Visit (INDEPENDENT_AMBULATORY_CARE_PROVIDER_SITE_OTHER): Payer: Medicare Other | Admitting: Nurse Practitioner

## 2022-04-20 VITALS — BP 150/74 | HR 91 | Ht 63.0 in | Wt 178.8 lb

## 2022-04-20 DIAGNOSIS — N1832 Chronic kidney disease, stage 3b: Secondary | ICD-10-CM | POA: Diagnosis not present

## 2022-04-20 DIAGNOSIS — E782 Mixed hyperlipidemia: Secondary | ICD-10-CM | POA: Diagnosis not present

## 2022-04-20 DIAGNOSIS — E1122 Type 2 diabetes mellitus with diabetic chronic kidney disease: Secondary | ICD-10-CM

## 2022-04-20 DIAGNOSIS — I10 Essential (primary) hypertension: Secondary | ICD-10-CM | POA: Diagnosis not present

## 2022-04-20 LAB — POCT GLYCOSYLATED HEMOGLOBIN (HGB A1C): Hemoglobin A1C: 8.3 % — AB (ref 4.0–5.6)

## 2022-04-20 NOTE — Progress Notes (Signed)
04/20/2022, 2:20 PM  Endocrinology follow-up note   Subjective:    Patient ID: Stephanie Moreno, female    DOB: [birth year].  Stephanie Moreno is being seen in follow-up after she was seen in consultation for management of currently uncontrolled symptomatic diabetes requested by  Margretta Sidle, MD.   Past Medical History:  Diagnosis Date   Acid reflux disease    Anemia    Anxiety    Asthma    Breast cancer ([place])    left side   Chronic back pain    Chronic kidney disease    Depression    Diabetes mellitus without complication ([place])    Diverticulitis    Hypertension    Lumbosacral spondylosis    Per previous PCP records   Sleep apnea    Thyroid disease     Past Surgical History:  Procedure Laterality Date   ABDOMINAL HYSTERECTOMY     BACK SURGERY     BIOPSY  02/19/2020   Procedure: BIOPSY;  Surgeon: Eloise Harman, DO;  [facility];  Service: Endoscopy;;   BREAST LUMPECTOMY Left 2003   BREAST SURGERY     COLONOSCOPY WITH PROPOFOL N/A 02/19/2020   non-bleeding internal hemorrhoids, many small-mouthed diverticula in entire colon.   ESOPHAGOGASTRODUODENOSCOPY (EGD) WITH PROPOFOL N/A 02/19/2020    benign-appearing esophageal stenosis s/p dilation, gastritis s/p biopsy, normal duodenum. Negative H.pylori, negative duodenal biopsy.    GIVENS CAPSULE STUDY N/A 06/14/2020   Procedure: GIVENS CAPSULE STUDY;  Surgeon: Eloise Harman, DO;  [facility];  Service: Endoscopy;  Laterality: N/A;  7:30am   THYROIDECTOMY     tummy tuck     at time of breast surgery 2003    Social History   Socioeconomic History   Marital status: Divorced    Spouse name: Not on file   Number of children: Not on file   Years of education: Not on file   Highest education level: Not on file  Occupational History   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 [place]  Tobacco Use   Smoking status: Never   Smokeless tobacco: Never   Vaping Use   Vaping Use: Never used  Substance and Sexual Activity   Alcohol use: Not Currently   Drug use: Not Currently   Sexual activity: Not on file  Other Topics Concern   Not on file  Social History Narrative   Not on file   Social Determinants of Health   Financial Resource Strain: Not on file  Food Insecurity: Not on file  Transportation Needs: Not on file  Physical Activity: Not on file  Stress: Not on file  Social Connections: Not on file    Family History  Problem Relation Age of Onset   Diabetes Mother    Thyroid disease Mother    Colon cancer Neg Hx     Outpatient Encounter Medications as of 04/20/2022  Medication Sig   acetaminophen (TYLENOL) 325 MG tablet Take 650 mg by mouth every 4 (four) hours as needed.   amLODipine (NORVASC) 10 MG tablet Take 10 mg by mouth daily.   aspirin 81 MG chewable tablet Chew 81 mg by mouth daily.   Azelastine HCl 0.15 % SOLN Place 1 spray into both nostrils daily.   docusate sodium (COLACE) 100 MG capsule Take 1 capsule (100 mg total) by mouth 2 (two) times daily.   fluticasone (FLONASE) 50 MCG/ACT nasal spray  Place 2 sprays into both nostrils daily.   Fluticasone Furoate (ARNUITY ELLIPTA) 200 MCG/ACT AEPB Inhale 1 puff into the lungs daily.   furosemide (LASIX) 20 MG tablet Take 1 tablet (20 mg total) by mouth every other day. Takes 40 mg every other day. On alternate days take 80 mg   glipiZIDE (GLUCOTROL XL) 5 MG 24 hr tablet Take 1 tablet (5 mg total) by mouth daily with breakfast.   insulin degludec (TRESIBA FLEXTOUCH) 100 UNIT/ML FlexTouch Pen Inject 50 Units into the skin at bedtime.   lidocaine (LIDODERM) 5 % Place 1 patch onto the skin daily.   losartan (COZAAR) 25 MG tablet Take 25 mg by mouth daily.   mirabegron ER (MYRBETRIQ) 50 MG TB24 tablet TAKE 1 TABLET BY MOUTH DAILY AT  9 AM   pantoprazole (PROTONIX) 40 MG tablet Take 1 tablet (40 mg total) by mouth daily. 30 minutes before breakfast   potassium chloride SA  (KLOR-CON M) 20 MEQ tablet Take 2 tablets (40 mEq total) by mouth 3 (three) times daily for 4 days.   TRULICITY 3 SF/6.8LE SOPN INJECT THE CONTENTS OF ONE PEN  SUBCUTANEOUSLY WEEKLY AS  DIRECTED   No facility-administered encounter medications on file as of 04/20/2022.    ALLERGIES: Allergies  Allergen Reactions   Codeine Nausea And Vomiting and Anaphylaxis   Carvedilol     Cold sweats, diarrhea, severe hot flashes   Hydrochlorothiazide Other (See Comments)    Other reaction(s): stomach upset   Lisinopril Nausea And Vomiting    Other reaction(s): cough   Oxybutynin Other (See Comments)   Penicillins Hives    VACCINATION STATUS: Immunization History  Administered Date(s) Administered   Fluad Quad(high Dose 65+) 03/17/2021   Pneumococcal Conjugate-13 09/02/2014   Pneumococcal Polysaccharide-23 07/03/2010   Tdap 07/03/2000    Diabetes She presents for her follow-up diabetic visit. She has type 2 diabetes mellitus. Onset time: Diagnosed at approx age of 67. Her disease course has been improving. There are no hypoglycemic associated symptoms. Associated symptoms include fatigue and foot paresthesias. Pertinent negatives for diabetes include no polydipsia, no polyuria and no weight loss. There are no hypoglycemic complications. Symptoms are stable. Diabetic complications include a CVA, nephropathy and peripheral neuropathy. Risk factors for coronary artery disease include diabetes mellitus, dyslipidemia, hypertension, post-menopausal and sedentary lifestyle. Current diabetic treatment includes oral agent (monotherapy) and insulin injections (and Trulicity). She is compliant with treatment most of the time. Her weight is fluctuating minimally. She is following a generally healthy diet. When asked about meal planning, she reported none. She has not had a previous visit with a dietitian. She rarely participates in exercise. Her home blood glucose trend is decreasing steadily. Her overall blood  glucose range is 140-180 mg/dl. (She presents today with her CGM showing improved glycemic profile overall.  Her POCT A1c today is 8.3%, improving from last visit of 8.7%.  Analysis of her CGM shows TIR 82%, TAR 18%, TBR 0%.  She denies any significant hypoglycemia, has had several close calls in which she overcorrected and had spike in sugar as a result.  She is going to have her esophagus stretched soon.  She still complains of back and hip pain.) An ACE inhibitor/angiotensin II receptor blocker is being taken. She does not see a podiatrist.Eye exam is current.  Hypertension This is a chronic problem. The current episode started more than 1 year ago. The problem is unchanged. The problem is controlled. Associated symptoms include peripheral edema. Agents associated with hypertension include  thyroid hormones. Risk factors for coronary artery disease include diabetes mellitus, dyslipidemia, post-menopausal state and sedentary lifestyle. Past treatments include diuretics, beta blockers, ACE inhibitors and angiotensin blockers. There are no compliance problems.  Hypertensive end-organ damage includes kidney disease and CVA. Identifiable causes of hypertension include chronic renal disease, sleep apnea and a thyroid problem.  Hyperlipidemia This is a chronic problem. The current episode started more than 1 year ago. The problem is controlled. Recent lipid tests were reviewed and are normal. Exacerbating diseases include chronic renal disease and hypothyroidism. Factors aggravating her hyperlipidemia include beta blockers. Current antihyperlipidemic treatment includes statins. The current treatment provides moderate improvement of lipids. There are no compliance problems.  Risk factors for coronary artery disease include diabetes mellitus, dyslipidemia, hypertension, obesity, post-menopausal and a sedentary lifestyle.     Review of systems  Constitutional: + minimally fluctuating body weight,  current Body  mass index is 31.67 kg/m. , + fatigue, no subjective hyperthermia, no subjective hypothermia Eyes: no blurry vision, no xerophthalmia ENT: no sore throat, no nodules palpated in throat, no dysphagia/odynophagia, no hoarseness Cardiovascular: no chest pain, no shortness of breath, no palpitations, + leg swelling Respiratory: no cough, no shortness of breath Gastrointestinal: no nausea/vomiting/diarrhea Musculoskeletal: ongoing back pain, walks with walker due to previous CVA and disequilibrium- has frequent falls Skin: no rashes, no hyperemia Neurological: no tremors, no dizziness, + numbness/tingling to BLE (on gabapentin) Psychiatric: no depression, no anxiety  Objective:     BP (!) 150/74 (BP Location: Right Arm, Patient Position: Sitting, Cuff Size: Large)   Pulse 91   Ht '5\' 3"'$  (1.6 m)   Wt 178 lb 12.8 oz (81.1 kg)   BMI 31.67 kg/m   Wt Readings from Last 3 Encounters:  04/20/22 178 lb 12.8 oz (81.1 kg)  04/19/22 177 lb (80.3 kg)  04/12/22 177 lb (80.3 kg)    BP Readings from Last 3 Encounters:  04/20/22 (!) 150/74  04/19/22 (!) 144/71  04/12/22 (!) 174/84     Physical Exam- Limited  Constitutional:  Body mass index is 31.67 kg/m. , not in acute distress, normal state of mind Eyes:  EOMI, no exophthalmos Neck: Supple Cardiovascular: RRR, no murmurs, rubs, or gallops, + pitting edema to BLE Respiratory: Adequate breathing efforts, no crackles, rales, rhonchi, or wheezing Musculoskeletal: no gross deformities,  walks with walker due to disequilibrium and frequent falls Skin:  no rashes, no hyperemia Neurological: no tremor with outstretched hands   Diabetic Foot Exam - Simple   No data filed     CMP ( most recent) CMP     Component Value Date/Time   NA 139 04/19/2022 1045   NA 128 (L) 03/20/2022 1743   K 2.7 (LL) 04/19/2022 1045   CL 105 04/19/2022 1045   CO2 24 04/19/2022 1045   GLUCOSE 163 (H) 04/19/2022 1045   BUN 21 04/19/2022 1045   BUN 13  03/20/2022 1743   CREATININE 1.53 (H) 04/19/2022 1045   CREATININE 1.42 (H) 10/12/2020 1009   CALCIUM 9.0 04/19/2022 1045   PROT 8.0 03/23/2022 1256   PROT 7.7 03/20/2022 1743   ALBUMIN 3.7 03/23/2022 1256   ALBUMIN 4.0 03/20/2022 1743   AST 33 03/23/2022 1256   ALT 30 03/23/2022 1256   ALKPHOS 96 03/23/2022 1256   BILITOT 0.4 03/23/2022 1256   BILITOT 0.2 03/20/2022 1743   GFRNONAA 35 (L) 04/19/2022 1045   GFRNONAA 36 (L) 10/12/2020 1009   GFRAA 42 (L) 10/12/2020 1009     Diabetic Labs (  most recent): Lab Results  Component Value Date   HGBA1C 8.3 (A) 04/20/2022   HGBA1C 8.7 12/19/2021   HGBA1C 7.8 (A) 09/16/2021   MICROALBUR 80 10/26/2020     Lipid Panel ( most recent) Lipid Panel     Component Value Date/Time   CHOL 91 04/19/2021 0000   TRIG 78 04/19/2021 0000   HDL 42 04/19/2021 0000   CHOLHDL 2.0 10/12/2020 1009   LDLCALC 34 04/19/2021 0000   LDLCALC 46 10/12/2020 1009      Lab Results  Component Value Date   TSH 3.94 10/12/2020   TSH 1.18 03/09/2020      Assessment & Plan:   1) Type 2 diabetes mellitus with stage 3b chronic kidney disease, without long-term current use of insulin ([place])  - Stephanie Moreno has currently uncontrolled symptomatic type 2 DM since  76 years of age.  She presents today with her CGM showing improved glycemic profile overall.  Her POCT A1c today is 8.3%, improving from last visit of 8.7%.  Analysis of her CGM shows TIR 82%, TAR 18%, TBR 0%.  She denies any significant hypoglycemia, has had several close calls in which she overcorrected and had spike in sugar as a result.  She is going to have her esophagus stretched soon.  She still complains of back and hip pain.   Recent labs reviewed.    - I had a long discussion with her about the progressive nature of diabetes and the pathology behind its complications. -her diabetes is complicated by CKD, CVA and she remains at a high risk for more acute and chronic complications which  include CAD, CVA, CKD, retinopathy, and neuropathy. These are all discussed in detail with her.  - Nutritional counseling repeated at each appointment due to patients tendency to fall back in to old habits.  - The patient admits there is a room for improvement in their diet and drink choices. -  Suggestion is made for the patient to avoid simple carbohydrates from their diet including Cakes, Sweet Desserts / Pastries, Ice Cream, Soda (diet and regular), Sweet Tea, Candies, Chips, Cookies, Sweet Pastries, Store Bought Juices, Alcohol in Excess of 1-2 drinks a day, Artificial Sweeteners, Coffee Creamer, and "Sugar-free" Products. This will help patient to have stable blood glucose profile and potentially avoid unintended weight gain.   - I encouraged the patient to switch to unprocessed or minimally processed complex starch and increased protein intake (animal or plant source), fruits, and vegetables.   - Patient is advised to stick to a routine mealtimes to eat 3 meals a day and avoid unnecessary snacks (to snack only to correct hypoglycemia).  - she will be scheduled with Jearld Fenton, RDN, CDE for diabetes education.  - I have approached her with the following individualized plan to manage  her diabetes and patient agrees:   - she will need at least basal insulin in order for her to achieve control of diabetes to target, and she accepts this treatment option at this time.    -Based on her stable and improving glycemic profile, no changes will be made to her medication regimen today.  She is advised to continue her Tresiba 50 units SQ nightly, Glipizide 5 mg XL daily with breakfast, and continue her Trulicity 3 mg SQ weekly.    -She is encouraged to continue monitoring blood glucose twice daily (using her CGM), before breakfast and before bed, and to call the clinic if she has readings less than 70 or greater  than 300 for 3 tests in a row.   - Specific targets for  A1c;  LDL, HDL,  and  Triglycerides were discussed with the patient.  2) Blood Pressure /Hypertension:   Her blood pressure is controlled to target for her age.   she is advised to continue her current medications including Losartan 25 mg p.o. daily with breakfast, and Lasix 20 every other day.  Will defer changes to nephrologist.  3) Lipids/Hyperlipidemia:    Review of her recent lipid panel from 04/19/21 showed controlled LDL at 34.  she  is advised to continue Crestor 10 mg p.o. daily at bedtime.   Side effects and precautions discussed with her.  4)  Weight/Diet:  Her Body mass index is 31.67 kg/m.  -   clearly complicating her diabetes care.   she is  a candidate for weight loss. I discussed with her the fact that loss of 5 - 10% of her  current body weight will have the most impact on her diabetes management.  Exercise, and detailed carbohydrates information provided  -  detailed on discharge instructions.  5) Chronic Care/Health Maintenance: -she  is on ACEI/ARB and Statin medications and  is encouraged to initiate and continue to follow up with Ophthalmology, Dentist,  Podiatrist at least yearly or according to recommendations, and advised to  stay away from smoking. I have recommended yearly flu vaccine and pneumonia vaccine at least every 5 years; she cannot exercise optimally due to her disequilibrium.  She is advised to sleep for at least 7 hours a day.  - she is  advised to maintain close follow up with Margretta Sidle, MD for primary care needs, as well as her other providers for optimal and coordinated care.      I spent 35 minutes in the care of the patient today including review of labs from [facility], Lipids, Thyroid Function, Hematology (current and previous including abstractions from other facilities); face-to-face time discussing  her blood glucose readings/logs, discussing hypoglycemia and hyperglycemia episodes and symptoms, medications doses, her options of short and long term treatment based on the  latest standards of care / guidelines;  discussion about incorporating lifestyle medicine;  and documenting the encounter. Risk reduction counseling performed per USPSTF guidelines to reduce obesity and cardiovascular risk factors.     Please refer to Patient Instructions for Blood Glucose Monitoring and Insulin/Medications Dosing Guide"  in media tab for additional information. Please  also refer to " Patient Self Inventory" in the Media  tab for reviewed elements of pertinent patient history.  Stephanie Moreno participated in the discussions, expressed understanding, and voiced agreement with the above plans.  All questions were answered to her satisfaction. she is encouraged to contact clinic should she have any questions or concerns prior to her return visit.   Follow up plan: - Return in about 3 months (around 07/21/2022) for Diabetes F/U with A1c in office, No previsit labs, Bring meter and logs.  Rayetta Pigg, [facility] [address] [place], [place] [address] Phone: [phone] Fax: [phone]  04/20/2022, 2:20 PM

## 2022-04-24 ENCOUNTER — Ambulatory Visit (HOSPITAL_BASED_OUTPATIENT_CLINIC_OR_DEPARTMENT_OTHER): Payer: Medicare Other | Admitting: Anesthesiology

## 2022-04-24 ENCOUNTER — Encounter (HOSPITAL_COMMUNITY): Payer: Self-pay

## 2022-04-24 ENCOUNTER — Ambulatory Visit (HOSPITAL_COMMUNITY): Payer: Medicare Other | Admitting: Anesthesiology

## 2022-04-24 ENCOUNTER — Ambulatory Visit (HOSPITAL_COMMUNITY)
Admission: RE | Admit: 2022-04-24 | Discharge: 2022-04-24 | Disposition: A | Discharge status: Home / Self Care | Payer: Medicare Other | Attending: Internal Medicine | Admitting: Internal Medicine

## 2022-04-24 ENCOUNTER — Encounter (HOSPITAL_COMMUNITY)
Admission: RE | Disposition: A | Discharge status: Home / Self Care | Payer: Self-pay | Source: Home / Self Care | Attending: Internal Medicine

## 2022-04-24 DIAGNOSIS — R1310 Dysphagia, unspecified: Secondary | ICD-10-CM

## 2022-04-24 DIAGNOSIS — N1832 Chronic kidney disease, stage 3b: Secondary | ICD-10-CM

## 2022-04-24 DIAGNOSIS — I129 Hypertensive chronic kidney disease with stage 1 through stage 4 chronic kidney disease, or unspecified chronic kidney disease: Secondary | ICD-10-CM | POA: Diagnosis not present

## 2022-04-24 DIAGNOSIS — K222 Esophageal obstruction: Secondary | ICD-10-CM

## 2022-04-24 DIAGNOSIS — K319 Disease of stomach and duodenum, unspecified: Secondary | ICD-10-CM | POA: Diagnosis not present

## 2022-04-24 DIAGNOSIS — E1122 Type 2 diabetes mellitus with diabetic chronic kidney disease: Secondary | ICD-10-CM | POA: Diagnosis not present

## 2022-04-24 DIAGNOSIS — K2970 Gastritis, unspecified, without bleeding: Secondary | ICD-10-CM | POA: Diagnosis not present

## 2022-04-24 DIAGNOSIS — K449 Diaphragmatic hernia without obstruction or gangrene: Secondary | ICD-10-CM | POA: Diagnosis not present

## 2022-04-24 DIAGNOSIS — N189 Chronic kidney disease, unspecified: Secondary | ICD-10-CM | POA: Diagnosis not present

## 2022-04-24 DIAGNOSIS — K219 Gastro-esophageal reflux disease without esophagitis: Secondary | ICD-10-CM | POA: Insufficient documentation

## 2022-04-24 DIAGNOSIS — F418 Other specified anxiety disorders: Secondary | ICD-10-CM | POA: Diagnosis not present

## 2022-04-24 HISTORY — PX: BALLOON DILATION: SHX5330

## 2022-04-24 HISTORY — PX: ESOPHAGOGASTRODUODENOSCOPY (EGD) WITH PROPOFOL: SHX5813

## 2022-04-24 HISTORY — PX: BIOPSY: SHX5522

## 2022-04-24 LAB — POCT I-STAT, CHEM 8
BUN: 20 mg/dL (ref 8–23)
BUN: 17 mg/dL (ref 8–23)
Calcium, Ion: 1.09 mmol/L — ABNORMAL LOW (ref 1.15–1.40)
Calcium, Ion: 1.09 mmol/L — ABNORMAL LOW (ref 1.15–1.40)
Chloride: 113 mmol/L — ABNORMAL HIGH (ref 98–111)
Chloride: 113 mmol/L — ABNORMAL HIGH (ref 98–111)
Creatinine, Ser: 1.5 mg/dL — ABNORMAL HIGH (ref 0.44–1.00)
Creatinine, Ser: 1.4 mg/dL — ABNORMAL HIGH (ref 0.44–1.00)
Glucose, Bld: 199 mg/dL — ABNORMAL HIGH (ref 70–99)
Glucose, Bld: 185 mg/dL — ABNORMAL HIGH (ref 70–99)
HCT: 43 % (ref 36.0–46.0)
HCT: 44 % (ref 36.0–46.0)
Hemoglobin: 14.6 g/dL (ref 12.0–15.0)
Hemoglobin: 15 g/dL (ref 12.0–15.0)
Potassium: 6.4 mmol/L (ref 3.5–5.1)
Potassium: 5.5 mmol/L — ABNORMAL HIGH (ref 3.5–5.1)
Sodium: 137 mmol/L (ref 135–145)
Sodium: 138 mmol/L (ref 135–145)
TCO2: 19 mmol/L — ABNORMAL LOW (ref 22–32)
TCO2: 20 mmol/L — ABNORMAL LOW (ref 22–32)

## 2022-04-24 SURGERY — ESOPHAGOGASTRODUODENOSCOPY (EGD) WITH PROPOFOL
Anesthesia: General

## 2022-04-24 MED ORDER — PROPOFOL 10 MG/ML IV BOLUS
INTRAVENOUS | Status: DC | PRN
  Administered 2022-04-24: 80 mg via INTRAVENOUS
  Administered 2022-04-24: 40 mg via INTRAVENOUS

## 2022-04-24 MED ORDER — LIDOCAINE HCL (CARDIAC) PF 100 MG/5ML IV SOSY
PREFILLED_SYRINGE | INTRAVENOUS | Status: DC | PRN
  Administered 2022-04-24: 50 mg via INTRATRACHEAL

## 2022-04-24 MED ORDER — LACTATED RINGERS IV SOLN: INTRAVENOUS | Status: DC | PRN

## 2022-04-24 NOTE — H&P (Signed)
Primary Care Physician:  Margretta Sidle, MD Primary Gastroenterologist:  Dr. Abbey Chatters  Pre-Procedure History & Physical: HPI:  Stephanie Moreno is a 76 y.o. female is here  for an EGD with possible dilation for GERD/dysphagia.  Past Medical History:  Diagnosis Date   Acid reflux disease    Anemia    Anxiety    Asthma    Breast cancer ([place])    left side   Chronic back pain    Chronic kidney disease    Depression    Diabetes mellitus without complication ([place])    Diverticulitis    Hypertension    Lumbosacral spondylosis    Per previous PCP records   Sleep apnea    Thyroid disease     Past Surgical History:  Procedure Laterality Date   ABDOMINAL HYSTERECTOMY     BACK SURGERY     BIOPSY  02/19/2020   Procedure: BIOPSY;  Surgeon: Eloise Harman, DO;  [facility];  Service: Endoscopy;;   BREAST LUMPECTOMY Left 2003   BREAST SURGERY     COLONOSCOPY WITH PROPOFOL N/A 02/19/2020   non-bleeding internal hemorrhoids, many small-mouthed diverticula in entire colon.   ESOPHAGOGASTRODUODENOSCOPY (EGD) WITH PROPOFOL N/A 02/19/2020    benign-appearing esophageal stenosis s/p dilation, gastritis s/p biopsy, normal duodenum. Negative H.pylori, negative duodenal biopsy.    GIVENS CAPSULE STUDY N/A 06/14/2020   Procedure: GIVENS CAPSULE STUDY;  Surgeon: Eloise Harman, DO;  [facility];  Service: Endoscopy;  Laterality: N/A;  7:30am   THYROIDECTOMY     tummy tuck     at time of breast surgery 2003    Prior to Admission medications   Medication Sig Start Date End Date Taking? Authorizing Provider  acetaminophen (TYLENOL) 325 MG tablet Take 650 mg by mouth every 4 (four) hours as needed.   Yes [provider]  amLODipine (NORVASC) 10 MG tablet Take 10 mg by mouth daily.   Yes [provider]  aspirin 81 MG chewable tablet Chew 81 mg by mouth daily.   Yes [provider]  docusate sodium (COLACE) 100 MG capsule Take 1 capsule (100 mg  total) by mouth 2 (two) times daily. 01/04/22  Yes Jessy Oto, MD  fluticasone (FLONASE) 50 MCG/ACT nasal spray Place 2 sprays into both nostrils daily.   Yes [provider]  furosemide (LASIX) 20 MG tablet Take 1 tablet (20 mg total) by mouth every other day. Takes 40 mg every other day. On alternate days take 80 mg 04/18/21  Yes Johnson, Clanford L, MD  glipiZIDE (GLUCOTROL XL) 5 MG 24 hr tablet Take 1 tablet (5 mg total) by mouth daily with breakfast. 03/21/22  Yes Reardon, Loree Fee J, NP  insulin degludec (TRESIBA FLEXTOUCH) 100 UNIT/ML FlexTouch Pen Inject 50 Units into the skin at bedtime. 03/21/22  Yes Brita Romp, NP  mirabegron ER (MYRBETRIQ) 50 MG TB24 tablet TAKE 1 TABLET BY MOUTH DAILY AT  9 AM 04/12/22  Yes Stoneking, Reece Leader., MD  pantoprazole (PROTONIX) 40 MG tablet Take 1 tablet (40 mg total) by mouth daily. 30 minutes before breakfast 02/07/22  Yes Annitta Needs, NP  Azelastine HCl 0.15 % SOLN Place 1 spray into both nostrils daily. 08/19/21   [provider]  Fluticasone Furoate (ARNUITY ELLIPTA) 200 MCG/ACT AEPB Inhale 1 puff into the lungs daily.    [provider]  lidocaine (LIDODERM) 5 % Place 1 patch onto the skin daily.    [provider]  losartan (COZAAR) 25 MG tablet Take 25 mg by mouth daily.    [provider]  potassium chloride SA (KLOR-CON M) 20 MEQ tablet Take 2 tablets (40 mEq total) by mouth 3 (three) times daily for 4 days. 04/19/22 04/23/22  Annitta Needs, NP  TRULICITY 3 EY/8.1KG [facility] INJECT THE CONTENTS OF ONE PEN  SUBCUTANEOUSLY WEEKLY AS  DIRECTED 03/30/22   Brita Romp, NP    Allergies as of 04/03/2022 - Review Complete 03/23/2022  Allergen Reaction Noted   Codeine Nausea And Vomiting and Anaphylaxis 12/18/2019   Carvedilol  09/16/2021   Hydrochlorothiazide Other (See Comments) 12/18/2019   Lisinopril Nausea And Vomiting 12/18/2019   Oxybutynin Other (See Comments) 12/18/2019   Penicillins Hives  01/05/2020    Family History  Problem Relation Age of Onset   Diabetes Mother    Thyroid disease Mother    Colon cancer Neg Hx     Social History   Socioeconomic History   Marital status: Divorced    Spouse name: Not on file   Number of children: Not on file   Years of education: Not on file   Highest education level: Not on file  Occupational History   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 [place]  Tobacco Use   Smoking status: Never   Smokeless tobacco: Never  Vaping Use   Vaping Use: Never used  Substance and Sexual Activity   Alcohol use: Not Currently   Drug use: Not Currently   Sexual activity: Not on file  Other Topics Concern   Not on file  Social History Narrative   Not on file   Social Determinants of Health   Financial Resource Strain: Not on file  Food Insecurity: Not on file  Transportation Needs: Not on file  Physical Activity: Not on file  Stress: Not on file  Social Connections: Not on file  Intimate Partner Violence: Not on file    Review of Systems: General: Negative for fever, chills, fatigue, weakness. Eyes: Negative for vision changes.  ENT: Negative for hoarseness, difficulty swallowing , nasal congestion. CV: Negative for chest pain, angina, palpitations, dyspnea on exertion, peripheral edema.  Respiratory: Negative for dyspnea at rest, dyspnea on exertion, cough, sputum, wheezing.  GI: See history of present illness. GU:  Negative for dysuria, hematuria, urinary incontinence, urinary frequency, nocturnal urination.  MS: Negative for joint pain, low back pain.  Derm: Negative for rash or itching.  Neuro: Negative for weakness, abnormal sensation, seizure, frequent headaches, memory loss, confusion.  Psych: Negative for anxiety, depression Endo: Negative for unusual weight change.  Heme: Negative for bruising or bleeding. Allergy: Negative for rash or hives.  Physical Exam: Vital signs in last 24 hours:      General:   Alert,  Well-developed, well-nourished, pleasant and cooperative in NAD Head:  Normocephalic and atraumatic. Eyes:  Sclera clear, no icterus.   Conjunctiva pink. Ears:  Normal auditory acuity. Nose:  No deformity, discharge,  or lesions. Mouth:  No deformity or lesions, dentition normal. Neck:  Supple; no masses or thyromegaly. Lungs:  Clear throughout to auscultation.   No wheezes, crackles, or rhonchi. No acute distress. Heart:  Regular rate and rhythm; no murmurs, clicks, rubs,  or gallops. Abdomen:  Soft, nontender and nondistended. No masses, hepatosplenomegaly or hernias noted. Normal bowel sounds, without guarding, and without rebound.   Msk:  Symmetrical without gross deformities. Normal posture. Extremities:  Without clubbing or edema. Neurologic:  Alert and  oriented x4;  grossly normal neurologically. Skin:  Intact without significant lesions or rashes. Cervical Nodes:  No significant cervical adenopathy. Psych:  Alert and cooperative. Normal mood and affect.   Impression/Plan: [name] is here for an EGD with possible dilation for GERD/dysphagia.  Risks, benefits, limitations, imponderables and alternatives regarding EGD have been reviewed with the patient. Questions have been answered. All parties agreeable.

## 2022-04-24 NOTE — Discharge Instructions (Signed)
EGD Discharge instructions Please read the instructions outlined below and refer to this sheet in the next few weeks. These discharge instructions provide you with general information on caring for yourself after you leave the hospital. Your doctor may also give you specific instructions. While your treatment has been planned according to the most current medical practices available, unavoidable complications occasionally occur. If you have any problems or questions after discharge, please call your doctor. ACTIVITY You may resume your regular activity but move at a slower pace for the next 24 hours.  Take frequent rest periods for the next 24 hours.  Walking will help expel (get rid of) the air and reduce the bloated feeling in your abdomen.  No driving for 24 hours (because of the anesthesia (medicine) used during the test).  You may shower.  Do not sign any important legal documents or operate any machinery for 24 hours (because of the anesthesia used during the test).  NUTRITION Drink plenty of fluids.  You may resume your normal diet.  Begin with a light meal and progress to your normal diet.  Avoid alcoholic beverages for 24 hours or as instructed by your caregiver.  MEDICATIONS You may resume your normal medications unless your caregiver tells you otherwise.  WHAT YOU CAN EXPECT TODAY You may experience abdominal discomfort such as a feeling of fullness or "gas" pains.  FOLLOW-UP Your doctor will discuss the results of your test with you.  SEEK IMMEDIATE MEDICAL ATTENTION IF ANY OF THE FOLLOWING OCCUR: Excessive nausea (feeling sick to your stomach) and/or vomiting.  Severe abdominal pain and distention (swelling).  Trouble swallowing.  Temperature over 101 F (37.8 C).  Rectal bleeding or vomiting of blood.    Your EGD revealed mild amount inflammation in your stomach.  I took biopsies of this to rule out infection with a bacteria called H. pylori.  Await pathology results, my  office will contact you.  You did have a tightening of your esophagus called a Schatzki's ring which I stretched today.  Hopefully this helps with your swallowing.  Continue on pantoprazole daily.  Follow-up with GI in 3 months.  I hope you have a great rest of your week!  Elon Alas. Abbey Chatters, D.O. Gastroenterology and Hepatology [facility]

## 2022-04-24 NOTE — Op Note (Signed)
[facility] Patient Name: Stephanie Moreno Procedure Date: 04/24/2022 12:16 PM MRN: [id number] Date of Birth: [birth year] Attending MD: Elon Alas. Abbey Chatters DO CSN: [id number] Age: 76 Admit Type: Outpatient Procedure:                Upper GI endoscopy Indications:              Dysphagia Providers:                Elon Alas. Abbey Chatters, DO, Janeece Riggers, RN, Kristine L.                            Risa Grill, Technician Referring MD:             Elon Alas. Abbey Chatters, DO Medicines:                See the Anesthesia note for documentation of the                            administered medications Complications:            No immediate complications. Estimated Blood Loss:     Estimated blood loss was minimal. Procedure:                Pre-Anesthesia Assessment:                           - The anesthesia plan was to use monitored                            anesthesia care (MAC).                           After obtaining informed consent, the endoscope was                            passed under direct vision. Throughout the                            procedure, the patient's blood pressure, pulse, and                            oxygen saturations were monitored continuously. The                            GIF-H190 ([id number]) scope was introduced through the                            mouth, and advanced to the second part of duodenum.                            The upper GI endoscopy was accomplished without                            difficulty. The patient tolerated the procedure                            well. Scope  In: 1:05:24 PM Scope Out: 1:10:33 PM Total Procedure Duration: 0 hours 5 minutes 9 seconds  Findings:      A small hiatal hernia was present.      A mild Schatzki ring was found in the lower third of the esophagus. A       TTS dilator was passed through the scope. Dilation with an 18-19-20 mm       balloon dilator was performed to 20 mm. The dilation site was examined       and  showed mild mucosal disruption and moderate improvement in luminal       narrowing.      Patchy mild inflammation characterized by erythema was found in the       gastric body and in the gastric antrum. Biopsies were taken with a cold       forceps for Helicobacter pylori testing.      The duodenal bulb, first portion of the duodenum and second portion of       the duodenum were normal. Impression:               - Small hiatal hernia.                           - Mild Schatzki ring. Dilated.                           - Gastritis. Biopsied.                           - Normal duodenal bulb, first portion of the                            duodenum and second portion of the duodenum. Moderate Sedation:      Per Anesthesia Care Recommendation:           - Patient has a contact number available for                            emergencies. The signs and symptoms of potential                            delayed complications were discussed with the                            patient. Return to normal activities tomorrow.                            Written discharge instructions were provided to the                            patient.                           - Resume previous diet.                           - Continue present medications.                           -  Await pathology results.                           - Repeat upper endoscopy PRN for retreatment.                           - Use Protonix (pantoprazole) 40 mg PO daily.                           - Return to GI clinic in 3 months. Procedure Code(s):        --- Professional ---                           [phone], Esophagogastroduodenoscopy, flexible,                            transoral; with transendoscopic balloon dilation of                            esophagus (less than 30 mm diameter)                           43239, 59, Esophagogastroduodenoscopy, flexible,                            transoral; with biopsy, single or  multiple Diagnosis Code(s):        --- Professional ---                           K44.9, Diaphragmatic hernia without obstruction or                            gangrene                           K22.2, Esophageal obstruction                           K29.70, Gastritis, unspecified, without bleeding                           R13.10, Dysphagia, unspecified CPT copyright 2019 American Medical Association. All rights reserved. The codes documented in this report are preliminary and upon coder review may  be revised to meet current compliance requirements. Elon Alas. Abbey Chatters, DO [place] Abbey Chatters, DO 04/24/2022 1:13:59 PM This report has been signed electronically. Number of Addenda: 0

## 2022-04-24 NOTE — Anesthesia Preprocedure Evaluation (Signed)
Anesthesia Evaluation  Patient identified by MRN, date of birth, ID band Patient awake    Reviewed: Allergy & Precautions, H&P , NPO status , Patient's Chart, lab work & pertinent test results, reviewed documented beta blocker date and time   Airway Mallampati: II  TM Distance: >3 FB Neck ROM: full    Dental no notable dental hx.    Pulmonary asthma , sleep apnea ,    Pulmonary exam normal breath sounds clear to auscultation       Cardiovascular Exercise Tolerance: Good hypertension, negative cardio ROS   Rhythm:regular Rate:Normal     Neuro/Psych PSYCHIATRIC DISORDERS Anxiety Depression negative neurological ROS     GI/Hepatic Neg liver ROS, GERD  Medicated,  Endo/Other  negative endocrine ROSdiabetes, Type 2  Renal/GU CRFRenal disease  negative genitourinary   Musculoskeletal   Abdominal   Peds  Hematology  (+) Blood dyscrasia, anemia ,   Anesthesia Other Findings   Reproductive/Obstetrics negative OB ROS                             Anesthesia Physical Anesthesia Plan  ASA: 3  Anesthesia Plan: General   Post-op Pain Management:    Induction:   PONV Risk Score and Plan: Propofol infusion  Airway Management Planned:   Additional Equipment:   Intra-op Plan:   Post-operative Plan:   Informed Consent: I have reviewed the patients History and Physical, chart, labs and discussed the procedure including the risks, benefits and alternatives for the proposed anesthesia with the patient or authorized representative who has indicated his/her understanding and acceptance.     Dental Advisory Given  Plan Discussed with: CRNA  Anesthesia Plan Comments:         Anesthesia Quick Evaluation

## 2022-04-24 NOTE — Transfer of Care (Signed)
Immediate Anesthesia Transfer of Care Note  Patient: Stephanie Moreno  Procedure(s) Performed: ESOPHAGOGASTRODUODENOSCOPY (EGD) WITH PROPOFOL BALLOON DILATION BIOPSY  Patient Location: Short Stay  Anesthesia Type:General  Level of Consciousness: awake, alert , oriented and patient cooperative  Airway & Oxygen Therapy: Patient Spontanous Breathing  Post-op Assessment: Report given to RN, Post -op Vital signs reviewed and stable and Patient moving all extremities  Post vital signs: Reviewed and stable  Last Vitals:  Vitals Value Taken Time  BP    Temp    Pulse    Resp    SpO2      Last Pain:  Vitals:   04/24/22 1304  PainSc: 0-No pain         Complications: No notable events documented.

## 2022-04-25 ENCOUNTER — Telehealth: Payer: Self-pay

## 2022-04-25 ENCOUNTER — Telehealth: Payer: Self-pay | Admitting: *Deleted

## 2022-04-25 NOTE — Telephone Encounter (Signed)
Patient has cancelled her upcoming appointment due to transportation, please call to reschedule.

## 2022-04-25 NOTE — Telephone Encounter (Signed)
Spoke with the pt, she advises since she had EGD she has had diarrhea. I asked how many times had she gone to the bathroom she advised 3. She has abdomen pain at a level 6.5, she is very fatigued. I asked her did she feel like she was going to pass out she said she has to move or get up slow and when she does walk she uses her walker. She stated that after eating rice and salmon an hour later she went back to the bathroom. Please advise

## 2022-04-26 LAB — SURGICAL PATHOLOGY

## 2022-04-26 NOTE — Anesthesia Postprocedure Evaluation (Signed)
Anesthesia Post Note  Patient: Stephanie Moreno  Procedure(s) Performed: ESOPHAGOGASTRODUODENOSCOPY (EGD) WITH PROPOFOL BALLOON DILATION BIOPSY  Patient location during evaluation: Phase II Anesthesia Type: General Level of consciousness: awake Pain management: pain level controlled Vital Signs Assessment: post-procedure vital signs reviewed and stable Respiratory status: spontaneous breathing and respiratory function stable Cardiovascular status: blood pressure returned to baseline and stable Postop Assessment: no headache and no apparent nausea or vomiting Anesthetic complications: no Comments: Late entry   No notable events documented.   Last Vitals:  Vitals:   04/24/22 1315  BP: 111/63  Pulse: 94  Resp: 20  Temp: 36.6 C  SpO2: 93%    Last Pain:  Vitals:   04/25/22 1504  TempSrc:   PainSc: 0-No pain                 Louann Sjogren

## 2022-04-27 ENCOUNTER — Encounter (HOSPITAL_COMMUNITY): Payer: Self-pay | Admitting: Internal Medicine

## 2022-04-27 NOTE — Telephone Encounter (Signed)
I do not think her EGD and diarrhea are related.  Is she feeling better now?  She can take Imodium as needed.  If she is still having significant pain would recommend x-ray of her chest and abdomen.  If her pain is severe, she can go to the ER for evaluation.  Thank you

## 2022-04-27 NOTE — Telephone Encounter (Signed)
Call the pt and the phone hung up after it was answered.

## 2022-04-28 ENCOUNTER — Ambulatory Visit: Payer: Medicare Other | Admitting: Podiatry

## 2022-04-28 DIAGNOSIS — R0600 Dyspnea, unspecified: Secondary | ICD-10-CM | POA: Insufficient documentation

## 2022-04-28 NOTE — Telephone Encounter (Signed)
Phoned the pt and her phone just continuously rang. No vm/ans

## 2022-05-01 NOTE — Telephone Encounter (Signed)
FYI  Dr Abbey Chatters,  Phoned the pt and spoke with her regarding her symptoms and she is very fatigued and she stated that last month she had covid and was advised that her potassium was low. Now she is achy all over. I advised the pt that she needs to see her PCP because the way she is describing her symptoms this sounds like something viral. Pt agreed to call and schedule with her PCP.

## 2022-05-03 DIAGNOSIS — I129 Hypertensive chronic kidney disease with stage 1 through stage 4 chronic kidney disease, or unspecified chronic kidney disease: Secondary | ICD-10-CM | POA: Diagnosis not present

## 2022-05-03 DIAGNOSIS — E875 Hyperkalemia: Secondary | ICD-10-CM | POA: Diagnosis not present

## 2022-05-03 DIAGNOSIS — E8721 Acute metabolic acidosis: Secondary | ICD-10-CM | POA: Diagnosis not present

## 2022-05-03 DIAGNOSIS — R809 Proteinuria, unspecified: Secondary | ICD-10-CM | POA: Diagnosis not present

## 2022-05-03 DIAGNOSIS — E1122 Type 2 diabetes mellitus with diabetic chronic kidney disease: Secondary | ICD-10-CM | POA: Diagnosis not present

## 2022-05-03 DIAGNOSIS — R600 Localized edema: Secondary | ICD-10-CM | POA: Diagnosis not present

## 2022-05-03 DIAGNOSIS — E1129 Type 2 diabetes mellitus with other diabetic kidney complication: Secondary | ICD-10-CM | POA: Diagnosis not present

## 2022-05-03 DIAGNOSIS — N189 Chronic kidney disease, unspecified: Secondary | ICD-10-CM | POA: Diagnosis not present

## 2022-05-07 DIAGNOSIS — E1122 Type 2 diabetes mellitus with diabetic chronic kidney disease: Secondary | ICD-10-CM | POA: Diagnosis not present

## 2022-05-17 ENCOUNTER — Ambulatory Visit (INDEPENDENT_AMBULATORY_CARE_PROVIDER_SITE_OTHER): Payer: Medicare Other | Admitting: Gastroenterology

## 2022-05-17 ENCOUNTER — Encounter: Payer: Self-pay | Admitting: Gastroenterology

## 2022-05-17 VITALS — BP 137/82 | HR 88 | Temp 97.1°F | Ht 63.0 in | Wt 176.3 lb

## 2022-05-17 DIAGNOSIS — K219 Gastro-esophageal reflux disease without esophagitis: Secondary | ICD-10-CM | POA: Diagnosis not present

## 2022-05-17 DIAGNOSIS — K5900 Constipation, unspecified: Secondary | ICD-10-CM

## 2022-05-17 MED ORDER — POLYETHYLENE GLYCOL 3350 17 G PO PACK: 17.0000 g | PACK | Freq: Every day | ORAL | 3 refills | Status: DC

## 2022-05-17 MED ORDER — SUCRALFATE 1 G PO TABS: 1.0000 g | ORAL_TABLET | Freq: Three times a day (TID) | ORAL | 3 refills | Status: DC

## 2022-05-17 MED ORDER — PANTOPRAZOLE SODIUM 40 MG PO TBEC: 40.0000 mg | DELAYED_RELEASE_TABLET | Freq: Two times a day (BID) | ORAL | 5 refills | Status: DC

## 2022-05-17 NOTE — Patient Instructions (Signed)
Let's increase pantoprazole to twice a day, 30 minutes before breakfast and dinner. If you still feel you have that sensation of something in your throat after a month, please call and let me know.  I have sent in Miralax to your pharmacy.   I also refilled sucralfate (Carafate) to take up to 3 times a day if needed.  We will see you in 3 months!  Have a great Thanksgiving!  I enjoyed seeing you again today! As you know, I value our relationship and want to provide genuine, compassionate, and quality care. I welcome your feedback. If you receive a survey regarding your visit,  I greatly appreciate you taking time to fill this out. See you next time!  Annitta Needs, PhD, ANP-BC [facility]

## 2022-05-17 NOTE — Progress Notes (Signed)
Gastroenterology Office Note     Primary Care Physician:  Margretta Sidle, MD  Primary Gastroenterologist: Dr. Abbey Chatters    Chief Complaint   Chief Complaint  Patient presents with   Follow-up    Patient here today for a follow up on reflux. Patient says reflux is under control with pantoprazole 40 mg once per day prior to breakfast. She has some mid abdominal pains at times.     History of Present Illness   Stephanie Moreno is a 76 y.o. female presenting today in follow-up with a history of abdominal pain and nausea, constipation, GERD, anemia with component of IDA, chronic disease, with IDA resolved and previously evaluated. previously established with GI in [place] but moved to this area in June 2021.   In interim from last visit, completed EGD due to dysphagia with small hiatal hernia, mild Schatzki ring, s/p dilation. Gastritis s/p biopsy. Negative H.pylori.   Dysphagia resolved. Notes globus sensation. Pantoprazole once daily. Has to clear throat a lot.   Constipation: taking a stool softener BID Needs a prescription for Miralax. Has a BM once every 2-3 days. Was helping when taking Miralax every day. Needs miralax prescription.        Past Medical History:  Diagnosis Date   Acid reflux disease    Anemia    Anxiety    Asthma    Breast cancer ([place])    left side   Chronic back pain    Chronic kidney disease    Depression    Diabetes mellitus without complication ([place])    Diverticulitis    Hypertension    Lumbosacral spondylosis    Per previous PCP records   Sleep apnea    Thyroid disease     Past Surgical History:  Procedure Laterality Date   ABDOMINAL HYSTERECTOMY     BACK SURGERY     BALLOON DILATION N/A 04/24/2022   Procedure: BALLOON DILATION;  Surgeon: Eloise Harman, DO;  [facility];  Service: Endoscopy;  Laterality: N/A;   BIOPSY  02/19/2020   Procedure: BIOPSY;  Surgeon: Eloise Harman, DO;  [facility];   Service: Endoscopy;;   BIOPSY  04/24/2022   Procedure: BIOPSY;  Surgeon: Eloise Harman, DO;  [facility];  Service: Endoscopy;;   BREAST LUMPECTOMY Left 2003   BREAST SURGERY     COLONOSCOPY WITH PROPOFOL N/A 02/19/2020   non-bleeding internal hemorrhoids, many small-mouthed diverticula in entire colon.   ESOPHAGOGASTRODUODENOSCOPY (EGD) WITH PROPOFOL N/A 02/19/2020    benign-appearing esophageal stenosis s/p dilation, gastritis s/p biopsy, normal duodenum. Negative H.pylori, negative duodenal biopsy.    ESOPHAGOGASTRODUODENOSCOPY (EGD) WITH PROPOFOL N/A 04/24/2022   Procedure: ESOPHAGOGASTRODUODENOSCOPY (EGD) WITH PROPOFOL;  Surgeon: Eloise Harman, DO;  [facility];  Service: Endoscopy;  Laterality: N/A;  1:30 pm   GIVENS CAPSULE STUDY N/A 06/14/2020   Procedure: GIVENS CAPSULE STUDY;  Surgeon: Eloise Harman, DO;  [facility];  Service: Endoscopy;  Laterality: N/A;  7:30am   THYROIDECTOMY     tummy tuck     at time of breast surgery 2003    Current Outpatient Medications  Medication Sig Dispense Refill   acetaminophen (TYLENOL) 325 MG tablet Take 650 mg by mouth every 4 (four) hours as needed.     amLODipine (NORVASC) 10 MG tablet Take 10 mg by mouth daily.     aspirin 81 MG chewable tablet Chew 81 mg by mouth daily.  Azelastine HCl 0.15 % SOLN Place 1 spray into both nostrils daily.     docusate sodium (COLACE) 100 MG capsule Take 1 capsule (100 mg total) by mouth 2 (two) times daily. 60 capsule 6   fluticasone (FLONASE) 50 MCG/ACT nasal spray Place 2 sprays into both nostrils daily.     Fluticasone Furoate (ARNUITY ELLIPTA) 200 MCG/ACT AEPB Inhale 1 puff into the lungs daily.     furosemide (LASIX) 20 MG tablet Take 1 tablet (20 mg total) by mouth every other day. Takes 40 mg every other day. On alternate days take 80 mg (Patient taking differently: Take 20 mg by mouth daily.)     glipiZIDE (GLUCOTROL XL) 5 MG 24 hr tablet Take 1  tablet (5 mg total) by mouth daily with breakfast. 90 tablet 3   hydrALAZINE (APRESOLINE) 50 MG tablet Take 50 mg by mouth 2 (two) times daily.     insulin degludec (TRESIBA FLEXTOUCH) 100 UNIT/ML FlexTouch Pen Inject 50 Units into the skin at bedtime. 15 mL 3   lidocaine (LIDODERM) 5 % Place 1 patch onto the skin daily.     losartan (COZAAR) 25 MG tablet Take 25 mg by mouth daily.     mirabegron ER (MYRBETRIQ) 50 MG TB24 tablet TAKE 1 TABLET BY MOUTH DAILY AT  9 AM 100 tablet 2   pantoprazole (PROTONIX) 40 MG tablet Take 1 tablet (40 mg total) by mouth daily. 30 minutes before breakfast 90 tablet 3   TRULICITY 3 FX/9.0WI SOPN INJECT THE CONTENTS OF ONE PEN  SUBCUTANEOUSLY WEEKLY AS  DIRECTED 6 mL 1   No current facility-administered medications for this visit.    Allergies as of 05/17/2022 - Review Complete 05/17/2022  Allergen Reaction Noted   Codeine Nausea And Vomiting and Anaphylaxis 12/18/2019   Carvedilol  09/16/2021   Hydrochlorothiazide Other (See Comments) 12/18/2019   Lisinopril Nausea And Vomiting 12/18/2019   Oxybutynin Other (See Comments) 12/18/2019   Penicillins Hives 01/05/2020    Family History  Problem Relation Age of Onset   Diabetes Mother    Thyroid disease Mother    Colon cancer Neg Hx     Social History   Socioeconomic History   Marital status: Divorced    Spouse name: Not on file   Number of children: Not on file   Years of education: Not on file   Highest education level: Not on file  Occupational History   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 [place]  Tobacco Use   Smoking status: Never   Smokeless tobacco: Never  Vaping Use   Vaping Use: Never used  Substance and Sexual Activity   Alcohol use: Not Currently   Drug use: Not Currently   Sexual activity: Not on file  Other Topics Concern   Not on file  Social History Narrative   Not on file   Social Determinants of Health   Financial Resource Strain: Not on file   Food Insecurity: Not on file  Transportation Needs: Not on file  Physical Activity: Not on file  Stress: Not on file  Social Connections: Not on file  Intimate Partner Violence: Not on file     Review of Systems   Gen: Denies any fever, chills, fatigue, weight loss, lack of appetite.  CV: Denies chest pain, heart palpitations, peripheral edema, syncope.  Resp: Denies shortness of breath at rest or with exertion. Denies wheezing or cough.  GI: Denies dysphagia or odynophagia. Denies jaundice, hematemesis, fecal  incontinence. GU : Denies urinary burning, urinary frequency, urinary hesitancy MS: Denies joint pain, muscle weakness, cramps, or limitation of movement.  Derm: Denies rash, itching, dry skin Psych: Denies depression, anxiety, memory loss, and confusion Heme: Denies bruising, bleeding, and enlarged lymph nodes.   Physical Exam   BP 137/82 (BP Location: Left Arm, Patient Position: Sitting, Cuff Size: Large)   Pulse 88   Temp (!) 97.1 F (36.2 C) (Temporal)   Ht '5\' 3"'$  (1.6 m)   Wt 176 lb 4.8 oz (80 kg)   BMI 31.23 kg/m  General:   Alert and oriented. Pleasant and cooperative. Well-nourished and well-developed.  Head:  Normocephalic and atraumatic. Eyes:  Without icterus Abdomen:  +BS, soft, non-tender and non-distended. No HSM noted. No guarding or rebound. No masses appreciated.  Rectal:  Deferred  Msk:  Symmetrical without gross deformities. Normal posture. Extremities:  Without edema. Neurologic:  Alert and  oriented x4;  grossly normal neurologically. Skin:  Intact without significant lesions or rashes. Psych:  Alert and cooperative. Normal mood and affect.   Assessment   Stephanie Moreno is a 76 y.o. female presenting today in follow-up with a history of abdominal pain and nausea, constipation, GERD, anemia with component of IDA, chronic disease, with IDA resolved and previously evaluated.   Dysphagia: resolved s/p dilation.   GERD: with persistent globus  sensation and throat clearing. May have component of LPR. Increase PPI to BID and call with progress report in one month. If persistent, refer to ENT.    Constipation: stool softener BID. Refill Miralax, as this works well for her.    PLAN    Increase PPI to BID Miralax refilled Colace BID Call with update on globus sensation and refer to ENT if persistent Return in 3 months   Annitta Needs, PhD, [facility]

## 2022-06-06 DIAGNOSIS — E1122 Type 2 diabetes mellitus with diabetic chronic kidney disease: Secondary | ICD-10-CM | POA: Diagnosis not present

## 2022-06-07 DIAGNOSIS — N189 Chronic kidney disease, unspecified: Secondary | ICD-10-CM | POA: Diagnosis not present

## 2022-06-07 DIAGNOSIS — E1122 Type 2 diabetes mellitus with diabetic chronic kidney disease: Secondary | ICD-10-CM | POA: Diagnosis not present

## 2022-06-07 DIAGNOSIS — R809 Proteinuria, unspecified: Secondary | ICD-10-CM | POA: Diagnosis not present

## 2022-06-07 DIAGNOSIS — E1129 Type 2 diabetes mellitus with other diabetic kidney complication: Secondary | ICD-10-CM | POA: Diagnosis not present

## 2022-06-07 DIAGNOSIS — E875 Hyperkalemia: Secondary | ICD-10-CM | POA: Diagnosis not present

## 2022-06-13 DIAGNOSIS — N189 Chronic kidney disease, unspecified: Secondary | ICD-10-CM | POA: Diagnosis not present

## 2022-06-13 DIAGNOSIS — E1122 Type 2 diabetes mellitus with diabetic chronic kidney disease: Secondary | ICD-10-CM | POA: Diagnosis not present

## 2022-06-13 DIAGNOSIS — M25552 Pain in left hip: Secondary | ICD-10-CM | POA: Diagnosis not present

## 2022-06-13 DIAGNOSIS — R809 Proteinuria, unspecified: Secondary | ICD-10-CM | POA: Diagnosis not present

## 2022-06-13 DIAGNOSIS — E876 Hypokalemia: Secondary | ICD-10-CM | POA: Diagnosis not present

## 2022-06-13 DIAGNOSIS — I129 Hypertensive chronic kidney disease with stage 1 through stage 4 chronic kidney disease, or unspecified chronic kidney disease: Secondary | ICD-10-CM | POA: Diagnosis not present

## 2022-06-13 DIAGNOSIS — E1129 Type 2 diabetes mellitus with other diabetic kidney complication: Secondary | ICD-10-CM | POA: Diagnosis not present

## 2022-06-15 ENCOUNTER — Other Ambulatory Visit: Payer: Self-pay

## 2022-06-15 DIAGNOSIS — N1832 Chronic kidney disease, stage 3b: Secondary | ICD-10-CM

## 2022-06-15 MED ORDER — FREESTYLE LIBRE 2 READER DEVI: 0 refills | Status: DC

## 2022-06-20 DIAGNOSIS — K5900 Constipation, unspecified: Secondary | ICD-10-CM | POA: Diagnosis not present

## 2022-06-20 DIAGNOSIS — R0600 Dyspnea, unspecified: Secondary | ICD-10-CM | POA: Diagnosis not present

## 2022-06-20 DIAGNOSIS — R252 Cramp and spasm: Secondary | ICD-10-CM | POA: Insufficient documentation

## 2022-06-20 DIAGNOSIS — R0981 Nasal congestion: Secondary | ICD-10-CM | POA: Insufficient documentation

## 2022-06-20 DIAGNOSIS — M5442 Lumbago with sciatica, left side: Secondary | ICD-10-CM | POA: Diagnosis not present

## 2022-06-20 DIAGNOSIS — M5440 Lumbago with sciatica, unspecified side: Secondary | ICD-10-CM | POA: Insufficient documentation

## 2022-06-29 ENCOUNTER — Telehealth: Payer: Self-pay

## 2022-06-29 NOTE — Telephone Encounter (Signed)
Tried to call pt, but did not receive an answer and was unable to leave a message.

## 2022-07-07 DIAGNOSIS — M5442 Lumbago with sciatica, left side: Secondary | ICD-10-CM | POA: Diagnosis not present

## 2022-07-07 DIAGNOSIS — E1122 Type 2 diabetes mellitus with diabetic chronic kidney disease: Secondary | ICD-10-CM | POA: Diagnosis not present

## 2022-07-07 DIAGNOSIS — I10 Essential (primary) hypertension: Secondary | ICD-10-CM | POA: Diagnosis not present

## 2022-07-12 ENCOUNTER — Other Ambulatory Visit (HOSPITAL_COMMUNITY): Payer: Self-pay | Admitting: Internal Medicine

## 2022-07-12 DIAGNOSIS — Z1231 Encounter for screening mammogram for malignant neoplasm of breast: Secondary | ICD-10-CM

## 2022-07-18 ENCOUNTER — Ambulatory Visit (INDEPENDENT_AMBULATORY_CARE_PROVIDER_SITE_OTHER): Payer: 59

## 2022-07-18 ENCOUNTER — Ambulatory Visit (INDEPENDENT_AMBULATORY_CARE_PROVIDER_SITE_OTHER): Payer: 59 | Admitting: Orthopaedic Surgery

## 2022-07-18 DIAGNOSIS — M25552 Pain in left hip: Secondary | ICD-10-CM

## 2022-07-18 MED ORDER — HYDROCODONE-ACETAMINOPHEN 5-325 MG PO TABS: 1.0000 | ORAL_TABLET | Freq: Two times a day (BID) | ORAL | 0 refills | Status: DC | PRN

## 2022-07-18 NOTE — Progress Notes (Signed)
Office Visit Note   Patient: Stephanie Moreno           Date of Birth: 03/31/1946           MRN: [id number] Visit Date: 07/18/2022              Requested by: No referring provider defined for this encounter. PCP: No primary care provider on file.   Assessment & Plan: Visit Diagnoses:  1. Pain in left hip     Plan: Impression is left hip pain likely aggravation of underlying arthritis.  X-rays are negative for fracture.  I will prescribe hydrocodone for severe pain and also order intra-articular hip injection to be done by Dr. Rolena Infante.  Will see her back as needed.  Follow-Up Instructions: No follow-ups on file.   Orders:  Orders Placed This Encounter  Procedures   XR HIP UNILAT W OR W/O PELVIS 2-3 VIEWS LEFT   AMB referral to sports medicine   Meds ordered this encounter  Medications   HYDROcodone-acetaminophen (NORCO) 5-325 MG tablet    Sig: Take 1 tablet by mouth 2 (two) times daily as needed.    Dispense:  10 tablet    Refill:  0      Procedures: No procedures performed   Clinical Data: No additional findings.   Subjective: Chief Complaint  Patient presents with   Left Hip - Pain    HPI Stephanie Moreno is a 77 year old female here for left hip pain since Monday.  Had a fall and has had trouble with walking.  She can weight-bear but she does limp.  Denies any numbness and tingling.  She is reporting groin pain.  Uses a rollator.  Previous patient of Dr. Louanne Skye. Review of Systems  Constitutional: Negative.   HENT: Negative.    Eyes: Negative.   Respiratory: Negative.    Cardiovascular: Negative.   Endocrine: Negative.   Musculoskeletal: Negative.   Neurological: Negative.   Hematological: Negative.   Psychiatric/Behavioral: Negative.    All other systems reviewed and are negative.    Objective: Vital Signs: There were no vitals taken for this visit.  Physical Exam Vitals and nursing note reviewed.  Constitutional:      Appearance: She is well-developed.   HENT:     Head: Atraumatic.     Nose: Nose normal.  Eyes:     Extraocular Movements: Extraocular movements intact.  Cardiovascular:     Pulses: Normal pulses.  Pulmonary:     Effort: Pulmonary effort is normal.  Abdominal:     Palpations: Abdomen is soft.  Musculoskeletal:     Cervical back: Neck supple.  Skin:    General: Skin is warm.     Capillary Refill: Capillary refill takes less than 2 seconds.  Neurological:     Mental Status: She is alert. Mental status is at baseline.  Psychiatric:        Behavior: Behavior normal.        Thought Content: Thought content normal.        Judgment: Judgment normal.     Ortho Exam Examination of the left hip shows pain with FADIR.  There is no tenderness.  She can weight-bear with a rollator with a slight antalgic gait. Specialty Comments:  MRI LUMBAR SPINE WITHOUT AND WITH CONTRAST   TECHNIQUE: Multiplanar and multiecho pulse sequences of the lumbar spine were obtained without and with intravenous contrast.   CONTRAST:  67m MULTIHANCE GADOBENATE DIMEGLUMINE 529 MG/ML IV SOLN   COMPARISON:  Lumbar MRI  10/22/2020   FINDINGS: Segmentation:  5 lumbar vertebra   Alignment:  Normal   Vertebrae: Normal bone marrow. Negative for fracture or mass. No evidence of spinal infection.   Conus medullaris and cauda equina: Conus extends to the L2-3 level. Conus and cauda equina appear normal.   Paraspinal and other soft tissues: Bilateral renal cysts. No paraspinous mass, adenopathy, or fluid collection   Disc levels:   L1-2: Negative   L2-3: Negative   L3-4: Mild facet degeneration. Negative for disc protrusion or stenosis   L4-5: Shallow central disc protrusion unchanged. Bilateral facet hypertrophy. Mild spinal stenosis improved likely due to laminotomy. Mild subarticular stenosis bilaterally.   L5-S1: Pedicle screw and interbody fusion. Negative for spinal or foraminal stenosis.   IMPRESSION: 1. Improved spinal  stenosis at L4-5 likely due to interval laminotomy 2. Interval pedicle screw and interbody fusion L5-S1. Negative for stenosis.     Electronically Signed   By: Franchot Gallo M.D.   On: 10/11/2021 14:03  Imaging: XR HIP UNILAT W OR W/O PELVIS 2-3 VIEWS LEFT  Result Date: 07/18/2022 No acute or structure abnormalities.  X-rays show hip osteoarthritis.    PMFS History: Patient Active Problem List   Diagnosis Date Noted   History of UTI 04/12/2022   Urge incontinence 07/26/2021   Urinary retention 04/15/2021   Acute kidney injury ([place]) 04/12/2021   Acute renal failure superimposed on chronic kidney disease, unspecified CKD stage, unspecified acute renal failure type 04/12/2021   Acute renal failure superimposed on stage 3b chronic kidney disease ([place]) 04/12/2021   Dehydration    Spondylolisthesis, lumbar region    Spinal stenosis, lumbar region, with neurogenic claudication    Dysphagia 03/18/2021   Speech disturbance 03/18/2021   Wheezing 03/18/2021   Ileus ([place]) 03/18/2021   Fusion of spine of lumbar region 03/15/2021   Cerebrovascular disease 12/20/2020   GERD (gastroesophageal reflux disease) 11/18/2020   Type 2 diabetes mellitus with stage 3b chronic kidney disease, without long-term current use of insulin ([place]) 07/12/2020   Mixed hyperlipidemia 07/12/2020   Essential hypertension, benign 07/12/2020   Iron deficiency anemia    Loss of weight 04/09/2020   Dyspepsia 01/28/2020   Constipation 01/28/2020   Lower abdominal pain 01/28/2020   Normocytic anemia 01/28/2020   Past Medical History:  Diagnosis Date   Acid reflux disease    Anemia    Anxiety    Asthma    Breast cancer ([place])    left side   Chronic back pain    Chronic kidney disease    Depression    Diabetes mellitus without complication ([place])    Diverticulitis    Hypertension    Lumbosacral spondylosis    Per previous PCP records   Sleep apnea    Thyroid disease     Family History  Problem  Relation Age of Onset   Diabetes Mother    Thyroid disease Mother    Colon cancer Neg Hx     Past Surgical History:  Procedure Laterality Date   ABDOMINAL HYSTERECTOMY     BACK SURGERY     BALLOON DILATION N/A 04/24/2022   Procedure: BALLOON DILATION;  Surgeon: Eloise Harman, DO;  [facility];  Service: Endoscopy;  Laterality: N/A;   BIOPSY  02/19/2020   Procedure: BIOPSY;  Surgeon: Eloise Harman, DO;  [facility];  Service: Endoscopy;;   BIOPSY  04/24/2022   Procedure: BIOPSY;  Surgeon: Eloise Harman, DO;  [facility];  Service: Endoscopy;;   BREAST LUMPECTOMY Left 2003   BREAST SURGERY     COLONOSCOPY WITH PROPOFOL N/A 02/19/2020   non-bleeding internal hemorrhoids, many small-mouthed diverticula in entire colon.   ESOPHAGOGASTRODUODENOSCOPY (EGD) WITH PROPOFOL N/A 02/19/2020    benign-appearing esophageal stenosis s/p dilation, gastritis s/p biopsy, normal duodenum. Negative H.pylori, negative duodenal biopsy.    ESOPHAGOGASTRODUODENOSCOPY (EGD) WITH PROPOFOL N/A 04/24/2022   small hiatal hernia, mild Schatzki ring, s/p dilation. Gastritis s/p biopsy. Negative H.pylori.   GIVENS CAPSULE STUDY N/A 06/14/2020   Procedure: GIVENS CAPSULE STUDY;  Surgeon: Eloise Harman, DO;  [facility];  Service: Endoscopy;  Laterality: N/A;  7:30am   THYROIDECTOMY     tummy tuck     at time of breast surgery 2003   Social History   Occupational History   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 [place]  Tobacco Use   Smoking status: Never   Smokeless tobacco: Never  Vaping Use   Vaping Use: Never used  Substance and Sexual Activity   Alcohol use: Not Currently   Drug use: Not Currently   Sexual activity: Not on file

## 2022-07-24 ENCOUNTER — Encounter: Payer: Self-pay | Admitting: Sports Medicine

## 2022-07-24 ENCOUNTER — Ambulatory Visit: Payer: Self-pay

## 2022-07-24 ENCOUNTER — Ambulatory Visit (INDEPENDENT_AMBULATORY_CARE_PROVIDER_SITE_OTHER): Payer: 59 | Admitting: Sports Medicine

## 2022-07-24 DIAGNOSIS — M25552 Pain in left hip: Secondary | ICD-10-CM | POA: Diagnosis not present

## 2022-07-24 MED ORDER — METHYLPREDNISOLONE ACETATE 40 MG/ML IJ SUSP
40.0000 mg | INTRAMUSCULAR | Status: AC | PRN
  Administered 2022-07-24: 40 mg via INTRA_ARTICULAR

## 2022-07-24 MED ORDER — LIDOCAINE HCL 1 % IJ SOLN
4.0000 mL | INTRAMUSCULAR | Status: AC | PRN
  Administered 2022-07-24: 4 mL

## 2022-07-24 NOTE — Progress Notes (Addendum)
   Procedure Note  Patient: Stephanie Moreno             Date of Birth: [birth year]           MRN: [id number]             Visit Date: 07/24/2022  Procedures: Visit Diagnoses:  1. Pain in left hip    Large Joint Inj: L hip joint on 07/24/2022 3:08 PM Indications: pain Details: 22 G 3.5 in needle, ultrasound-guided anterior approach Medications: 4 mL lidocaine 1 %; 40 mg methylPREDNISolone acetate 40 MG/ML Outcome: tolerated well, no immediate complications  Procedure: US-guided intra-articular hip injection, left After discussion on risks/benefits/indications and informed verbal consent was obtained, a timeout was performed. Patient was lying supine on exam table. The hip was cleaned with betadine and alcohol swabs. Then utilizing ultrasound guidance, the patient's femoral head and neck junction was identified and subsequently injected with 4:1 lidocaine:depomedrol via an in-plane approach with ultrasound visualization of the injectate administered into the hip joint. Patient tolerated procedure well without immediate complications.  Procedure, treatment alternatives, risks and benefits explained, specific risks discussed. Consent was given by the patient. Immediately prior to procedure a time out was called to verify the correct patient, procedure, equipment, support staff and site/side marked as required. Patient was prepped and draped in the usual sterile fashion.     - I evaluated the patient about 10 minutes post-injection and she still had rather significant pain with range of motion. Discussed the injection medicine would take 2-3 days to start kicking in. If by Th/Fri she is still having significant pain, recommend she follow-up with Dr. Erlinda Hong to discuss next steps. Return precautions discussed/ - I am happy to see them as needed  Elba Barman, DO [place]  This note was dictated using Dragon naturally speaking software and  may contain errors in syntax, spelling, or content which have not been identified prior to signing this note.

## 2022-07-25 ENCOUNTER — Telehealth: Payer: Self-pay | Admitting: Orthopaedic Surgery

## 2022-07-25 ENCOUNTER — Ambulatory Visit
Admission: EM | Admit: 2022-07-25 | Discharge: 2022-07-25 | Disposition: A | Discharge status: Home / Self Care | Payer: 59 | Attending: Family Medicine | Admitting: Family Medicine

## 2022-07-25 DIAGNOSIS — R519 Headache, unspecified: Secondary | ICD-10-CM | POA: Insufficient documentation

## 2022-07-25 DIAGNOSIS — Z20822 Contact with and (suspected) exposure to covid-19: Secondary | ICD-10-CM | POA: Insufficient documentation

## 2022-07-25 DIAGNOSIS — R1084 Generalized abdominal pain: Secondary | ICD-10-CM | POA: Insufficient documentation

## 2022-07-25 DIAGNOSIS — R110 Nausea: Secondary | ICD-10-CM | POA: Diagnosis not present

## 2022-07-25 MED ORDER — TRAMADOL HCL 50 MG PO TABS: 50.0000 mg | ORAL_TABLET | Freq: Every day | ORAL | 0 refills | Status: DC | PRN

## 2022-07-25 NOTE — ED Provider Notes (Signed)
[facility]    CSN: [id number] Arrival date & time: 07/25/22  1131      History   Chief Complaint No chief complaint on file.   HPI Stephanie Moreno is a 77 y.o. female.   Patient presenting today with 3 to 4 days of weakness, fatigue, headache, generalized abdominal pain, bouts of nausea, constipation, nasal congestion.  Denies known fever, chills, chest pain, shortness of breath, cough, vomiting, rashes.  Was recently around 2 family members with COVID but does not feel like she has COVID.  Has been working with orthopedics on pain control for her left hip after a recent fall, was on hydrocodone on last week and got a steroid injection into her hip yesterday which has caused some instability of her blood sugars.  So far not trying anything additionally over-the-counter for her symptoms.    Past Medical History:  Diagnosis Date   Acid reflux disease    Anemia    Anxiety    Asthma    Breast cancer ([place])    left side   Chronic back pain    Chronic kidney disease    Depression    Diabetes mellitus without complication ([place])    Diverticulitis    Hypertension    Lumbosacral spondylosis    Per previous PCP records   Sleep apnea    Thyroid disease     Patient Active Problem List   Diagnosis Date Noted   History of UTI 04/12/2022   Urge incontinence 07/26/2021   Urinary retention 04/15/2021   Acute kidney injury ([place]) 04/12/2021   Acute renal failure superimposed on chronic kidney disease, unspecified CKD stage, unspecified acute renal failure type 04/12/2021   Acute renal failure superimposed on stage 3b chronic kidney disease ([place]) 04/12/2021   Dehydration    Spondylolisthesis, lumbar region    Spinal stenosis, lumbar region, with neurogenic claudication    Dysphagia 03/18/2021   Speech disturbance 03/18/2021   Wheezing 03/18/2021   Ileus ([place]) 03/18/2021   Fusion of spine of lumbar region 03/15/2021   Cerebrovascular disease 12/20/2020   GERD  (gastroesophageal reflux disease) 11/18/2020   Type 2 diabetes mellitus with stage 3b chronic kidney disease, without long-term current use of insulin ([place]) 07/12/2020   Mixed hyperlipidemia 07/12/2020   Essential hypertension, benign 07/12/2020   Iron deficiency anemia    Loss of weight 04/09/2020   Dyspepsia 01/28/2020   Constipation 01/28/2020   Lower abdominal pain 01/28/2020   Normocytic anemia 01/28/2020    Past Surgical History:  Procedure Laterality Date   ABDOMINAL HYSTERECTOMY     BACK SURGERY     BALLOON DILATION N/A 04/24/2022   Procedure: BALLOON DILATION;  Surgeon: Eloise Harman, DO;  [facility];  Service: Endoscopy;  Laterality: N/A;   BIOPSY  02/19/2020   Procedure: BIOPSY;  Surgeon: Eloise Harman, DO;  [facility];  Service: Endoscopy;;   BIOPSY  04/24/2022   Procedure: BIOPSY;  Surgeon: Eloise Harman, DO;  [facility];  Service: Endoscopy;;   BREAST LUMPECTOMY Left 2003   BREAST SURGERY     COLONOSCOPY WITH PROPOFOL N/A 02/19/2020   non-bleeding internal hemorrhoids, many small-mouthed diverticula in entire colon.   ESOPHAGOGASTRODUODENOSCOPY (EGD) WITH PROPOFOL N/A 02/19/2020    benign-appearing esophageal stenosis s/p dilation, gastritis s/p biopsy, normal duodenum. Negative H.pylori, negative duodenal biopsy.    ESOPHAGOGASTRODUODENOSCOPY (EGD) WITH PROPOFOL N/A 04/24/2022   small hiatal hernia, mild Schatzki ring, s/p dilation. Gastritis s/p biopsy.  Negative H.pylori.   GIVENS CAPSULE STUDY N/A 06/14/2020   Procedure: GIVENS CAPSULE STUDY;  Surgeon: Eloise Harman, DO;  [facility];  Service: Endoscopy;  Laterality: N/A;  7:30am   THYROIDECTOMY     tummy tuck     at time of breast surgery 2003    OB History   No obstetric history on file.      Home Medications    Prior to Admission medications   Medication Sig Start Date End Date Taking? Authorizing Provider  acetaminophen (TYLENOL)  325 MG tablet Take 650 mg by mouth every 4 (four) hours as needed.    [provider]  amLODipine (NORVASC) 10 MG tablet Take 10 mg by mouth daily.    [provider]  aspirin 81 MG chewable tablet Chew 81 mg by mouth daily.    [provider]  Azelastine HCl 0.15 % SOLN Place 1 spray into both nostrils daily. 08/19/21   [provider]  Continuous Blood Gluc Receiver (FREESTYLE LIBRE 2 READER) DEVI Use to check glucose as directed. 06/15/22   Cassandria Anger, MD  docusate sodium (COLACE) 100 MG capsule Take 1 capsule (100 mg total) by mouth 2 (two) times daily. 01/04/22   Jessy Oto, MD  fluticasone (FLONASE) 50 MCG/ACT nasal spray Place 2 sprays into both nostrils daily.    [provider]  Fluticasone Furoate (ARNUITY ELLIPTA) 200 MCG/ACT AEPB Inhale 1 puff into the lungs daily.    [provider]  furosemide (LASIX) 20 MG tablet Take 1 tablet (20 mg total) by mouth every other day. Takes 40 mg every other day. On alternate days take 80 mg Patient taking differently: Take 20 mg by mouth daily. 04/18/21   Johnson, Clanford L, MD  glipiZIDE (GLUCOTROL XL) 5 MG 24 hr tablet Take 1 tablet (5 mg total) by mouth daily with breakfast. 03/21/22   Brita Romp, NP  hydrALAZINE (APRESOLINE) 50 MG tablet Take 50 mg by mouth 2 (two) times daily.    [provider]  HYDROcodone-acetaminophen (NORCO) 5-325 MG tablet Take 1 tablet by mouth 2 (two) times daily as needed. 07/18/22   Leandrew Koyanagi, MD  insulin degludec (TRESIBA FLEXTOUCH) 100 UNIT/ML FlexTouch Pen Inject 50 Units into the skin at bedtime. 03/21/22   Brita Romp, NP  lidocaine (LIDODERM) 5 % Place 1 patch onto the skin daily.    [provider]  losartan (COZAAR) 25 MG tablet Take 25 mg by mouth daily.    [provider]  mirabegron ER (MYRBETRIQ) 50 MG TB24 tablet TAKE 1 TABLET BY MOUTH DAILY AT  9 AM 04/12/22   Stoneking, Reece Leader., MD  pantoprazole  (PROTONIX) 40 MG tablet Take 1 tablet (40 mg total) by mouth daily. 30 minutes before breakfast 02/07/22   Annitta Needs, NP  pantoprazole (PROTONIX) 40 MG tablet Take 1 tablet (40 mg total) by mouth 2 (two) times daily before a meal. 05/17/22   Annitta Needs, NP  polyethylene glycol (MIRALAX / GLYCOLAX) 17 g packet Take 17 g by mouth daily. 05/17/22   Annitta Needs, NP  sucralfate (CARAFATE) 1 g tablet Take 1 tablet (1 g total) by mouth in the morning, at noon, and at bedtime. May crush and mix with water if needed. 05/17/22   Annitta Needs, NP  TRULICITY 3 GD/9.2EQ [facility] INJECT THE CONTENTS OF ONE PEN  SUBCUTANEOUSLY WEEKLY AS  DIRECTED 03/30/22   Brita Romp, NP  Family History Family History  Problem Relation Age of Onset   Diabetes Mother    Thyroid disease Mother    Colon cancer Neg Hx     Social History Social History   Tobacco Use   Smoking status: Never   Smokeless tobacco: Never  Vaping Use   Vaping Use: Never used  Substance Use Topics   Alcohol use: Not Currently   Drug use: Not Currently     Allergies   Codeine, Carvedilol, Hydrochlorothiazide, Lisinopril, Oxybutynin, and Penicillins   Review of Systems Review of Systems Per HPI  Physical Exam Triage Vital Signs ED Triage Vitals  Enc Vitals Group     BP 07/25/22 1147 (!) 163/89     Pulse Rate 07/25/22 1147 81     Resp 07/25/22 1147 18     Temp 07/25/22 1147 99.3 F (37.4 C)     Temp Source 07/25/22 1147 Oral     SpO2 07/25/22 1147 92 %     Weight --      Height --      Head Circumference --      Peak Flow --      Pain Score 07/25/22 1149 0     Pain Loc --      Pain Edu? --      Excl. in [place]? --    No data found.  Updated Vital Signs BP (!) 163/89 (BP Location: Right Arm)   Pulse 81   Temp 99.3 F (37.4 C) (Oral)   Resp 18   SpO2 92%   Visual Acuity Right Eye Distance:   Left Eye Distance:   Bilateral Distance:    Right Eye Near:   Left Eye Near:    Bilateral Near:      Physical Exam Vitals and nursing note reviewed.  Constitutional:      Appearance: Normal appearance. She is not ill-appearing.  HENT:     Head: Atraumatic.     Nose: Rhinorrhea present.     Mouth/Throat:     Mouth: Mucous membranes are moist.     Pharynx: Oropharynx is clear. No posterior oropharyngeal erythema.  Eyes:     Extraocular Movements: Extraocular movements intact.     Conjunctiva/sclera: Conjunctivae normal.  Cardiovascular:     Rate and Rhythm: Normal rate and regular rhythm.     Heart sounds: Normal heart sounds.  Pulmonary:     Effort: Pulmonary effort is normal.     Breath sounds: Normal breath sounds. No wheezing or rales.  Abdominal:     General: Bowel sounds are normal. There is no distension.     Palpations: Abdomen is soft. There is no mass.     Tenderness: There is no abdominal tenderness. There is no right CVA tenderness, left CVA tenderness, guarding or rebound.  Musculoskeletal:        General: Normal range of motion.     Cervical back: Normal range of motion and neck supple.  Skin:    General: Skin is warm and dry.  Neurological:     Mental Status: She is alert and oriented to person, place, and time.     Motor: No weakness.     Gait: Gait normal.  Psychiatric:        Mood and Affect: Mood normal.        Thought Content: Thought content normal.        Judgment: Judgment normal.      UC Treatments / Results  Labs (all labs ordered are  listed, but only abnormal results are displayed) Labs Reviewed  SARS CORONAVIRUS 2 (TAT 6-24 HRS)  COMPREHENSIVE METABOLIC PANEL  CBC WITH DIFFERENTIAL/PLATELET    EKG   Radiology No results found.  Procedures Procedures (including critical care time)  Medications Ordered in UC Medications - No data to display  Initial Impression / Assessment and Plan / UC Course  I have reviewed the triage vital signs and the nursing notes.  Pertinent labs & imaging results that were available during my care of  the patient were reviewed by me and considered in my medical decision making (see chart for details).     Minimally hypertensive in triage, otherwise vital signs reassuring.  Given her exposures to COVID and vague generalized symptoms will rule out COVID-19, this test is pending, will also perform basic labs for other rule out.  She is overall well-appearing today and in no acute distress and exam is reassuring.  Discussed to push fluids, eat a bland diet, Tylenol as needed and return for worsening symptoms. Final Clinical Impressions(s) / UC Diagnoses   Final diagnoses:  Exposure to COVID-19 virus  Acute nonintractable headache, unspecified headache type  Nausea without vomiting  Generalized abdominal pain   Discharge Instructions   None    ED Prescriptions   None    PDMP not reviewed this encounter.   Volney American, [place] 07/25/22 1320

## 2022-07-25 NOTE — ED Triage Notes (Signed)
Pt reports she has a headache and feels tired and weak, x 4 days.    Pt started getting cortisone shots x 11 days ago along with hydrocodone pills.

## 2022-07-25 NOTE — Telephone Encounter (Signed)
Pt called requesting a refill of hydrocodone. Please send to pharmacy on file. Pt phone number is [phone].

## 2022-07-25 NOTE — Telephone Encounter (Signed)
We cannot refill norco.  I can send tramadol

## 2022-07-26 ENCOUNTER — Ambulatory Visit: Payer: Medicare Other | Admitting: Nurse Practitioner

## 2022-07-26 DIAGNOSIS — N1832 Chronic kidney disease, stage 3b: Secondary | ICD-10-CM

## 2022-07-26 DIAGNOSIS — I10 Essential (primary) hypertension: Secondary | ICD-10-CM

## 2022-07-26 DIAGNOSIS — E782 Mixed hyperlipidemia: Secondary | ICD-10-CM

## 2022-07-26 LAB — CBC WITH DIFFERENTIAL/PLATELET
Basophils Absolute: 0.1 10*3/uL (ref 0.0–0.2)
Basos: 1 %
EOS (ABSOLUTE): 0.1 10*3/uL (ref 0.0–0.4)
Eos: 1 %
Hematocrit: 38.9 % (ref 34.0–46.6)
Hemoglobin: 12.7 g/dL (ref 11.1–15.9)
Immature Grans (Abs): 0 10*3/uL (ref 0.0–0.1)
Immature Granulocytes: 0 %
Lymphocytes Absolute: 3.4 10*3/uL — ABNORMAL HIGH (ref 0.7–3.1)
Lymphs: 46 %
MCH: 27.3 pg (ref 26.6–33.0)
MCHC: 32.6 g/dL (ref 31.5–35.7)
MCV: 84 fL (ref 79–97)
Monocytes Absolute: 0.3 10*3/uL (ref 0.1–0.9)
Monocytes: 5 %
Neutrophils Absolute: 3.6 10*3/uL (ref 1.4–7.0)
Neutrophils: 47 %
Platelets: 330 10*3/uL (ref 150–450)
RBC: 4.65 x10E6/uL (ref 3.77–5.28)
RDW: 12.7 % (ref 11.7–15.4)
WBC: 7.4 10*3/uL (ref 3.4–10.8)

## 2022-07-26 LAB — COMPREHENSIVE METABOLIC PANEL
ALT: 19 IU/L (ref 0–32)
AST: 22 IU/L (ref 0–40)
Albumin/Globulin Ratio: 1.3 (ref 1.2–2.2)
Albumin: 4.5 g/dL (ref 3.8–4.8)
Alkaline Phosphatase: 102 IU/L (ref 44–121)
BUN/Creatinine Ratio: 10 — ABNORMAL LOW (ref 12–28)
BUN: 16 mg/dL (ref 8–27)
Bilirubin Total: 0.3 mg/dL (ref 0.0–1.2)
CO2: 17 mmol/L — ABNORMAL LOW (ref 20–29)
Calcium: 9.7 mg/dL (ref 8.7–10.3)
Chloride: 105 mmol/L (ref 96–106)
Creatinine, Ser: 1.58 mg/dL — ABNORMAL HIGH (ref 0.57–1.00)
Globulin, Total: 3.6 g/dL (ref 1.5–4.5)
Glucose: 102 mg/dL — ABNORMAL HIGH (ref 70–99)
Potassium: 4.2 mmol/L (ref 3.5–5.2)
Sodium: 141 mmol/L (ref 134–144)
Total Protein: 8.1 g/dL (ref 6.0–8.5)
eGFR: 34 mL/min/{1.73_m2} — ABNORMAL LOW (ref >59)

## 2022-07-26 LAB — SARS CORONAVIRUS 2 (TAT 6-24 HRS): SARS Coronavirus 2: NEGATIVE

## 2022-07-26 NOTE — Telephone Encounter (Signed)
Called and relayed information to patient.

## 2022-08-02 ENCOUNTER — Telehealth: Payer: Self-pay | Admitting: Nurse Practitioner

## 2022-08-02 NOTE — Telephone Encounter (Signed)
Pt left voicemail stating that her glucose is dropping at night and wants a nurse to call her back.

## 2022-08-02 NOTE — Telephone Encounter (Signed)
Pt states she is experiencing drops in her BG. She was not able to give me specific readings but did state she's sleeps in until 12 or 1 in the afternoon and her glucose has been in the 50s and 60s when she gets up, states she notices her glucose drops in the evening around 7pm. Pt is taking glipizide '5mg'$  daily and tresiba 50 units at bedtime. I told pt she may have to bring her Elenor Legato 2 reader to the office so we can upload her data.

## 2022-08-03 NOTE — Telephone Encounter (Signed)
Discussed with pt, understanding voiced.She stated she would bring her reader to the office one day next week.

## 2022-08-03 NOTE — Telephone Encounter (Signed)
In the meantime, have her stop the Glipizide.

## 2022-08-04 ENCOUNTER — Other Ambulatory Visit: Payer: Self-pay | Admitting: *Deleted

## 2022-08-04 ENCOUNTER — Telehealth: Payer: Self-pay | Admitting: *Deleted

## 2022-08-04 DIAGNOSIS — E1122 Type 2 diabetes mellitus with diabetic chronic kidney disease: Secondary | ICD-10-CM

## 2022-08-04 MED ORDER — FREESTYLE LIBRE 2 SENSOR MISC: 2.0000 | 2 refills | Status: DC

## 2022-08-04 MED ORDER — INSULIN PEN NEEDLE 31G X 6 MM MISC: 1.0000 | Freq: Every day | 1 refills | Status: DC

## 2022-08-04 NOTE — Telephone Encounter (Signed)
Patient brought by her Elenor Legato reader for [place] to download.  Whitney reviewed the readings and advised that the patient stop the Glipizide. Patient was made aware. She shared that she has been having injections in her hip, and feels that this may be some of the issue. She will call our office next week with a progress report.

## 2022-08-07 DIAGNOSIS — E1122 Type 2 diabetes mellitus with diabetic chronic kidney disease: Secondary | ICD-10-CM | POA: Diagnosis not present

## 2022-08-08 ENCOUNTER — Telehealth: Payer: Self-pay | Admitting: Orthopaedic Surgery

## 2022-08-08 ENCOUNTER — Other Ambulatory Visit: Payer: Self-pay | Admitting: Physician Assistant

## 2022-08-08 MED ORDER — HYDROCODONE-ACETAMINOPHEN 5-325 MG PO TABS: ORAL_TABLET | ORAL | 0 refills | Status: DC

## 2022-08-08 NOTE — Telephone Encounter (Signed)
Patient called asked if she can get another cortisone injection in her left hip. Patient said sometimes she can hardly walk because of the pain. The number to contact patient is [phone]

## 2022-08-08 NOTE — Telephone Encounter (Signed)
It looks like she just had this done on 1/22

## 2022-08-08 NOTE — Telephone Encounter (Signed)
Called and notified patient.

## 2022-08-08 NOTE — Telephone Encounter (Signed)
Allergic to codeine so sent in norco to take sparinly

## 2022-08-11 ENCOUNTER — Ambulatory Visit (HOSPITAL_COMMUNITY)
Admission: RE | Admit: 2022-08-11 | Discharge: 2022-08-11 | Disposition: A | Discharge status: Home / Self Care | Payer: 59 | Source: Ambulatory Visit | Attending: Internal Medicine | Admitting: Internal Medicine

## 2022-08-11 DIAGNOSIS — Z1231 Encounter for screening mammogram for malignant neoplasm of breast: Secondary | ICD-10-CM | POA: Insufficient documentation

## 2022-08-18 ENCOUNTER — Other Ambulatory Visit: Payer: Self-pay | Admitting: Nurse Practitioner

## 2022-08-21 ENCOUNTER — Telehealth: Payer: Self-pay | Admitting: Orthopedic Surgery

## 2022-08-21 ENCOUNTER — Other Ambulatory Visit (HOSPITAL_COMMUNITY): Payer: Self-pay | Admitting: Family Medicine

## 2022-08-21 DIAGNOSIS — M1990 Unspecified osteoarthritis, unspecified site: Secondary | ICD-10-CM | POA: Insufficient documentation

## 2022-08-21 DIAGNOSIS — M79661 Pain in right lower leg: Secondary | ICD-10-CM

## 2022-08-21 DIAGNOSIS — Z79891 Long term (current) use of opiate analgesic: Secondary | ICD-10-CM | POA: Diagnosis not present

## 2022-08-21 DIAGNOSIS — M79662 Pain in left lower leg: Secondary | ICD-10-CM | POA: Diagnosis not present

## 2022-08-21 DIAGNOSIS — M79604 Pain in right leg: Secondary | ICD-10-CM

## 2022-08-21 NOTE — Telephone Encounter (Signed)
Sharee Pimple w/Dr. Josue Hector office called for Stephanie Moreno stating that the patient is being seen at our [facility] office but is having serious transportation issues.  Stephanie Moreno is wanting her seen ASAP for left hip pain.  Stated that the [facility] office would not give her a refill on her pain medications.  Please advised.

## 2022-08-22 NOTE — Progress Notes (Unsigned)
Gastroenterology Office Note     Primary Care Physician:  Celene Squibb, MD  Primary Gastroenterologist: Dr. Abbey Chatters    Chief Complaint   Chief Complaint  Patient presents with   Gastroesophageal Reflux    Patient here today for a follow up on Gerd. She is on pantoprazole 40 mg bid, which is controlling her symptoms. She has some issues with nausea in the mornings, no vomiting she takes Ondansetron every other morning.     History of Present Illness   Stephanie Moreno is a 77 y.o. female presenting today in follow-up with a history of abdominal pain and nausea, constipation, GERD, anemia with component of IDA, chronic disease, with IDA resolved and previously evaluated. previously established with GI in [place] but moved to this area in June 2021.     PPI increased to BID at last visit due to globus sensation and throat clearing, suspecting LPR. She has noted improvement with this. Unable to decrease to once daily.    Started prednisone and sugars spiking up. Has headache today. Some nausea in the mornings. Not much appetite.   Constipation: Miralax daily. Needs prescription. She would like to stick with this.    Oct 2023: small hiatal hernia, mild Schatzki ring, s/p dilation. Gastritis s/p biopsy. Negative H.pylori.    Past Medical History:  Diagnosis Date   Acid reflux disease    Anemia    Anxiety    Asthma    Breast cancer ([facility])    left side   Chronic back pain    Chronic kidney disease    Depression    Diabetes mellitus without complication ([place])    Diverticulitis    Hypertension    Lumbosacral spondylosis    Per previous PCP records   Sleep apnea    Thyroid disease     Past Surgical History:  Procedure Laterality Date   ABDOMINAL HYSTERECTOMY     BACK SURGERY     BALLOON DILATION N/A 04/24/2022   Procedure: BALLOON DILATION;  Surgeon: Eloise Harman, DO;  [facility];  Service: Endoscopy;  Laterality: N/A;   BIOPSY  02/19/2020    Procedure: BIOPSY;  Surgeon: Eloise Harman, DO;  [facility];  Service: Endoscopy;;   BIOPSY  04/24/2022   Procedure: BIOPSY;  Surgeon: Eloise Harman, DO;  [facility];  Service: Endoscopy;;   BREAST LUMPECTOMY Left 2003   BREAST SURGERY     COLONOSCOPY WITH PROPOFOL N/A 02/19/2020   non-bleeding internal hemorrhoids, many small-mouthed diverticula in entire colon.   ESOPHAGOGASTRODUODENOSCOPY (EGD) WITH PROPOFOL N/A 02/19/2020    benign-appearing esophageal stenosis s/p dilation, gastritis s/p biopsy, normal duodenum. Negative H.pylori, negative duodenal biopsy.    ESOPHAGOGASTRODUODENOSCOPY (EGD) WITH PROPOFOL N/A 04/24/2022   small hiatal hernia, mild Schatzki ring, s/p dilation. Gastritis s/p biopsy. Negative H.pylori.   GIVENS CAPSULE STUDY N/A 06/14/2020   Procedure: GIVENS CAPSULE STUDY;  Surgeon: Eloise Harman, DO;  [facility]: [facility];  Service: Endoscopy;  Laterality: N/A;  7:30am   THYROIDECTOMY     tummy tuck     at time of breast surgery 2003    Current Outpatient Medications  Medication Sig Dispense Refill   acetaminophen (TYLENOL) 325 MG tablet Take 650 mg by mouth every 4 (four) hours as needed.     amLODipine (NORVASC) 10 MG tablet Take 10 mg by mouth daily.     Azelastine HCl 0.15 % SOLN Place 1 spray  into both nostrils daily.     Continuous Blood Gluc Receiver (FREESTYLE LIBRE 2 READER) DEVI Use to check glucose as directed. 1 each 0   Continuous Blood Gluc Sensor (FREESTYLE LIBRE 2 SENSOR) MISC 2 each by Does not apply route every 14 (fourteen) days. 2 each 2   docusate sodium (COLACE) 100 MG capsule Take 1 capsule (100 mg total) by mouth 2 (two) times daily. 60 capsule 6   fluticasone (FLONASE) 50 MCG/ACT nasal spray Place 2 sprays into both nostrils daily.     Fluticasone Furoate (ARNUITY ELLIPTA) 200 MCG/ACT AEPB Inhale 1 puff into the lungs daily.     furosemide (LASIX) 20 MG tablet Take 1 tablet (20 mg total) by mouth  every other day. Takes 40 mg every other day. On alternate days take 80 mg (Patient taking differently: Take 20 mg by mouth daily.)     hydrALAZINE (APRESOLINE) 50 MG tablet Take 50 mg by mouth 2 (two) times daily.     HYDROcodone-acetaminophen (NORCO) 5-325 MG tablet Take 1 tablet by mouth 2 (two) times daily as needed. 10 tablet 0   insulin degludec (TRESIBA FLEXTOUCH) 100 UNIT/ML FlexTouch Pen Inject 50 Units into the skin at bedtime. 15 mL 3   Insulin Pen Needle 31G X 6 MM MISC 1 each by Does not apply route daily. 90 each 1   lidocaine (LIDODERM) 5 % Place 1 patch onto the skin daily.     losartan (COZAAR) 25 MG tablet Take 25 mg by mouth daily.     mirabegron ER (MYRBETRIQ) 50 MG TB24 tablet TAKE 1 TABLET BY MOUTH DAILY AT  9 AM 100 tablet 2   OVER THE COUNTER MEDICATION Prevagine one per day for memory.     pantoprazole (PROTONIX) 40 MG tablet Take 1 tablet (40 mg total) by mouth 2 (two) times daily before a meal. 60 tablet 5   sucralfate (CARAFATE) 1 g tablet Take 1 tablet (1 g total) by mouth in the morning, at noon, and at bedtime. May crush and mix with water if needed. 90 tablet 3   TRULICITY 3 0000000 SOPN INJECT THE CONTENTS OF ONE PEN  SUBCUTANEOUSLY WEEKLY AS  DIRECTED 6 mL 0   polyethylene glycol (MIRALAX / GLYCOLAX) 17 g packet Take 17 g by mouth daily. (Patient not taking: Reported on 08/23/2022) 90 each 3   No current facility-administered medications for this visit.    Allergies as of 08/23/2022 - Review Complete 08/23/2022  Allergen Reaction Noted   Codeine Nausea And Vomiting and Anaphylaxis 12/18/2019   Carvedilol  09/16/2021   Hydrochlorothiazide Other (See Comments) 12/18/2019   Lisinopril Nausea And Vomiting 12/18/2019   Oxybutynin Other (See Comments) 12/18/2019   Penicillins Hives 01/05/2020    Family History  Problem Relation Age of Onset   Diabetes Mother    Thyroid disease Mother    Colon cancer Neg Hx     Social History   Socioeconomic History    Marital status: Divorced    Spouse name: Not on file   Number of children: Not on file   Years of education: Not on file   Highest education level: Not on file  Occupational History   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 [place]  Tobacco Use   Smoking status: Never   Smokeless tobacco: Never  Vaping Use   Vaping Use: Never used  Substance and Sexual Activity   Alcohol use: Not Currently   Drug use: Not Currently  Sexual activity: Not on file  Other Topics Concern   Not on file  Social History Narrative   Not on file   Social Determinants of Health   Financial Resource Strain: Not on file  Food Insecurity: Not on file  Transportation Needs: Not on file  Physical Activity: Not on file  Stress: Not on file  Social Connections: Not on file  Intimate Partner Violence: Not on file     Review of Systems   Gen: Denies any fever, chills, fatigue, weight loss, lack of appetite.  CV: Denies chest pain, heart palpitations, peripheral edema, syncope.  Resp: Denies shortness of breath at rest or with exertion. Denies wheezing or cough.  GI: Denies dysphagia or odynophagia. Denies jaundice, hematemesis, fecal incontinence. GU : Denies urinary burning, urinary frequency, urinary hesitancy MS: Denies joint pain, muscle weakness, cramps, or limitation of movement.  Derm: Denies rash, itching, dry skin Psych: Denies depression, anxiety, memory loss, and confusion Heme: Denies bruising, bleeding, and enlarged lymph nodes.   Physical Exam   BP 137/85 (BP Location: Right Arm, Patient Position: Sitting, Cuff Size: Large)   Pulse (!) 109   Temp 98.6 F (37 C) (Temporal)   Ht 5' 3"$  (1.6 m)   Wt 171 lb 12.8 oz (77.9 kg)   BMI 30.43 kg/m  General:   Alert and oriented. Pleasant and cooperative. Well-nourished and well-developed.  Head:  Normocephalic and atraumatic. Eyes:  Without icterus Abdomen:  +BS, soft, non-tender and non-distended. No HSM noted. No  guarding or rebound. No masses appreciated.  Rectal:  Deferred  Msk:  Symmetrical without gross deformities. Normal posture. Extremities:  Without edema. Neurologic:  Alert and  oriented x4;  grossly normal neurologically. Skin:  Intact without significant lesions or rashes. Psych:  Alert and cooperative. Normal mood and affect.   Assessment   Stephanie Moreno is a 77 y.o. female presenting today in follow-up with a history of  abdominal pain and nausea, constipation, GERD, anemia with component of IDA, chronic disease, with IDA resolved and previously evaluated. previously established with GI in [place] but moved to this area in June 2021.    GERD: PPI BID has improved symptoms. She has been unable to wean down at this time. Will discuss at next visit decreasing to lowest effective dose that controls symptoms.   Constipation: would like to stay with Miralax. I have sent to pharmacy, although I suspect they will direct her to over the counter.     PLAN    PPI BID, discuss decreasing to once daily at next visit Miralax daily Zofran prn nausea Return in 6 months   Annitta Needs, PhD, [facility]enter[facility]

## 2022-08-23 ENCOUNTER — Ambulatory Visit (INDEPENDENT_AMBULATORY_CARE_PROVIDER_SITE_OTHER): Payer: 59 | Admitting: Gastroenterology

## 2022-08-23 ENCOUNTER — Encounter: Payer: Self-pay | Admitting: Gastroenterology

## 2022-08-23 ENCOUNTER — Ambulatory Visit: Payer: 59 | Admitting: Nurse Practitioner

## 2022-08-23 VITALS — BP 137/85 | HR 109 | Temp 98.6°F | Ht 63.0 in | Wt 171.8 lb

## 2022-08-23 DIAGNOSIS — K5900 Constipation, unspecified: Secondary | ICD-10-CM | POA: Diagnosis not present

## 2022-08-23 DIAGNOSIS — K219 Gastro-esophageal reflux disease without esophagitis: Secondary | ICD-10-CM | POA: Diagnosis not present

## 2022-08-23 MED ORDER — ONDANSETRON HCL 4 MG PO TABS: 4.0000 mg | ORAL_TABLET | Freq: Three times a day (TID) | ORAL | 1 refills | Status: DC | PRN

## 2022-08-23 MED ORDER — POLYETHYLENE GLYCOL 3350 17 GM/SCOOP PO POWD: ORAL | 3 refills | Status: DC

## 2022-08-23 NOTE — Patient Instructions (Signed)
We will continue pantoprazole twice a day for now.  I refilled Zofran for you.  I have sent in generic version of Miralax. This may or may not be covered. If not, it will need to be over the counter.   I will see you in 6 months!  I enjoyed seeing you again today! At our first visit, I mentioned how I value our relationship and want to provide genuine, compassionate, and quality care. You may receive a survey regarding your visit with me, and I welcome your feedback! Thanks so much for taking the time to complete this. I look forward to seeing you again.   Annitta Needs, PhD, ANP-BC [facility] Gastroenterology

## 2022-08-24 NOTE — Telephone Encounter (Signed)
Dr. Aline Brochure, will you please review the documentation below and advise.  Thank you

## 2022-08-24 NOTE — Telephone Encounter (Signed)
I called and lvm for Stephanie Moreno at Dr. Josue Hector letting her know that this patient will need to continue seeing her doctor in [place].

## 2022-08-24 NOTE — Telephone Encounter (Signed)
Its a no for me

## 2022-08-30 ENCOUNTER — Ambulatory Visit (HOSPITAL_COMMUNITY)
Admission: RE | Admit: 2022-08-30 | Discharge: 2022-08-30 | Disposition: A | Discharge status: Home / Self Care | Payer: 59 | Source: Ambulatory Visit | Attending: Family Medicine | Admitting: Family Medicine

## 2022-08-30 DIAGNOSIS — M79662 Pain in left lower leg: Secondary | ICD-10-CM | POA: Insufficient documentation

## 2022-08-30 DIAGNOSIS — M79605 Pain in left leg: Secondary | ICD-10-CM | POA: Insufficient documentation

## 2022-08-30 DIAGNOSIS — M79661 Pain in right lower leg: Secondary | ICD-10-CM

## 2022-08-30 DIAGNOSIS — M79604 Pain in right leg: Secondary | ICD-10-CM

## 2022-08-31 ENCOUNTER — Ambulatory Visit: Payer: 59 | Admitting: Orthopedic Surgery

## 2022-08-31 ENCOUNTER — Encounter: Payer: Self-pay | Admitting: Radiology

## 2022-09-05 DIAGNOSIS — E1122 Type 2 diabetes mellitus with diabetic chronic kidney disease: Secondary | ICD-10-CM | POA: Diagnosis not present

## 2022-09-06 ENCOUNTER — Ambulatory Visit: Payer: 59 | Admitting: Nurse Practitioner

## 2022-09-06 DIAGNOSIS — I10 Essential (primary) hypertension: Secondary | ICD-10-CM

## 2022-09-06 DIAGNOSIS — E782 Mixed hyperlipidemia: Secondary | ICD-10-CM

## 2022-09-06 DIAGNOSIS — E1122 Type 2 diabetes mellitus with diabetic chronic kidney disease: Secondary | ICD-10-CM

## 2022-09-08 DIAGNOSIS — E1129 Type 2 diabetes mellitus with other diabetic kidney complication: Secondary | ICD-10-CM | POA: Diagnosis not present

## 2022-09-08 DIAGNOSIS — E1122 Type 2 diabetes mellitus with diabetic chronic kidney disease: Secondary | ICD-10-CM | POA: Diagnosis not present

## 2022-09-08 DIAGNOSIS — R809 Proteinuria, unspecified: Secondary | ICD-10-CM | POA: Diagnosis not present

## 2022-09-08 DIAGNOSIS — N189 Chronic kidney disease, unspecified: Secondary | ICD-10-CM | POA: Diagnosis not present

## 2022-09-08 DIAGNOSIS — E876 Hypokalemia: Secondary | ICD-10-CM | POA: Diagnosis not present

## 2022-09-13 ENCOUNTER — Ambulatory Visit: Payer: 59 | Admitting: Nurse Practitioner

## 2022-09-13 DIAGNOSIS — I10 Essential (primary) hypertension: Secondary | ICD-10-CM

## 2022-09-13 DIAGNOSIS — E782 Mixed hyperlipidemia: Secondary | ICD-10-CM

## 2022-09-13 DIAGNOSIS — E1122 Type 2 diabetes mellitus with diabetic chronic kidney disease: Secondary | ICD-10-CM

## 2022-09-14 DIAGNOSIS — E119 Type 2 diabetes mellitus without complications: Secondary | ICD-10-CM | POA: Diagnosis not present

## 2022-09-14 DIAGNOSIS — E785 Hyperlipidemia, unspecified: Secondary | ICD-10-CM | POA: Diagnosis not present

## 2022-09-14 DIAGNOSIS — E041 Nontoxic single thyroid nodule: Secondary | ICD-10-CM | POA: Diagnosis not present

## 2022-09-14 DIAGNOSIS — I10 Essential (primary) hypertension: Secondary | ICD-10-CM | POA: Diagnosis not present

## 2022-09-17 DIAGNOSIS — F329 Major depressive disorder, single episode, unspecified: Secondary | ICD-10-CM | POA: Insufficient documentation

## 2022-09-19 DIAGNOSIS — R809 Proteinuria, unspecified: Secondary | ICD-10-CM | POA: Diagnosis not present

## 2022-09-19 DIAGNOSIS — E1122 Type 2 diabetes mellitus with diabetic chronic kidney disease: Secondary | ICD-10-CM | POA: Diagnosis not present

## 2022-09-19 DIAGNOSIS — N189 Chronic kidney disease, unspecified: Secondary | ICD-10-CM | POA: Diagnosis not present

## 2022-09-19 DIAGNOSIS — I129 Hypertensive chronic kidney disease with stage 1 through stage 4 chronic kidney disease, or unspecified chronic kidney disease: Secondary | ICD-10-CM | POA: Diagnosis not present

## 2022-09-19 DIAGNOSIS — E1129 Type 2 diabetes mellitus with other diabetic kidney complication: Secondary | ICD-10-CM | POA: Diagnosis not present

## 2022-09-20 DIAGNOSIS — K5900 Constipation, unspecified: Secondary | ICD-10-CM | POA: Diagnosis not present

## 2022-09-20 DIAGNOSIS — N1831 Chronic kidney disease, stage 3a: Secondary | ICD-10-CM | POA: Diagnosis not present

## 2022-09-20 DIAGNOSIS — R7401 Elevation of levels of liver transaminase levels: Secondary | ICD-10-CM | POA: Diagnosis not present

## 2022-09-20 DIAGNOSIS — E1122 Type 2 diabetes mellitus with diabetic chronic kidney disease: Secondary | ICD-10-CM | POA: Diagnosis not present

## 2022-09-20 DIAGNOSIS — K219 Gastro-esophageal reflux disease without esophagitis: Secondary | ICD-10-CM | POA: Diagnosis not present

## 2022-09-20 DIAGNOSIS — Z0001 Encounter for general adult medical examination with abnormal findings: Secondary | ICD-10-CM | POA: Diagnosis not present

## 2022-09-20 DIAGNOSIS — M25552 Pain in left hip: Secondary | ICD-10-CM | POA: Diagnosis not present

## 2022-09-20 DIAGNOSIS — E1165 Type 2 diabetes mellitus with hyperglycemia: Secondary | ICD-10-CM | POA: Diagnosis not present

## 2022-09-20 DIAGNOSIS — E782 Mixed hyperlipidemia: Secondary | ICD-10-CM | POA: Diagnosis not present

## 2022-09-20 DIAGNOSIS — K30 Functional dyspepsia: Secondary | ICD-10-CM | POA: Diagnosis not present

## 2022-09-20 DIAGNOSIS — R0600 Dyspnea, unspecified: Secondary | ICD-10-CM | POA: Diagnosis not present

## 2022-09-20 DIAGNOSIS — I129 Hypertensive chronic kidney disease with stage 1 through stage 4 chronic kidney disease, or unspecified chronic kidney disease: Secondary | ICD-10-CM | POA: Diagnosis not present

## 2022-09-21 ENCOUNTER — Ambulatory Visit (INDEPENDENT_AMBULATORY_CARE_PROVIDER_SITE_OTHER): Payer: 59 | Admitting: Orthopedic Surgery

## 2022-09-21 ENCOUNTER — Telehealth: Payer: Self-pay | Admitting: Orthopedic Surgery

## 2022-09-21 ENCOUNTER — Other Ambulatory Visit (INDEPENDENT_AMBULATORY_CARE_PROVIDER_SITE_OTHER): Payer: 59

## 2022-09-21 ENCOUNTER — Encounter: Payer: Self-pay | Admitting: Orthopedic Surgery

## 2022-09-21 VITALS — BP 150/96 | HR 88 | Ht 63.0 in | Wt 170.0 lb

## 2022-09-21 DIAGNOSIS — M25561 Pain in right knee: Secondary | ICD-10-CM

## 2022-09-21 DIAGNOSIS — M5416 Radiculopathy, lumbar region: Secondary | ICD-10-CM | POA: Diagnosis not present

## 2022-09-21 DIAGNOSIS — M25552 Pain in left hip: Secondary | ICD-10-CM

## 2022-09-21 DIAGNOSIS — M48062 Spinal stenosis, lumbar region with neurogenic claudication: Secondary | ICD-10-CM

## 2022-09-21 DIAGNOSIS — G8929 Other chronic pain: Secondary | ICD-10-CM

## 2022-09-21 DIAGNOSIS — M4316 Spondylolisthesis, lumbar region: Secondary | ICD-10-CM

## 2022-09-21 DIAGNOSIS — R29818 Other symptoms and signs involving the nervous system: Secondary | ICD-10-CM

## 2022-09-21 NOTE — Telephone Encounter (Signed)
Referral placed called to advise she will get a call so she doesn't have to worry about scheduling of those.

## 2022-09-21 NOTE — Patient Instructions (Addendum)
See neurosurgery for injections lumbar   See chronic pain manager

## 2022-09-21 NOTE — Telephone Encounter (Signed)
I discussed with Dr Lemmie Evens she can go back to the [facility] office / Dr Laurance Flatten, I will call her to discuss. I will put in a new referral as well

## 2022-09-21 NOTE — Telephone Encounter (Signed)
Dr. Aline Brochure patient - pt stated that Dr. Aline Brochure is sending her for back injections.  She stated that the people are confused on who she is supposed to see and what she is supposed to have done, she would like a call back [phone].

## 2022-09-21 NOTE — Progress Notes (Signed)
Chief Complaint  Patient presents with   Hip Pain    Left    Fell jan 15 seen by ortho jan 16 xrays mild oa  This is a 77 year old female status post lumbar fusion who fell on January 15 seen by orthopedics on January 16 x-rays of the left hip showed mild arthritis.  She was sent for injection in the hip joint which she received on 22 January.  She says she got relief for maybe a day or 2  She presents with severe left hip pain, she is using a rolling walker, she requested treatment in [place] after being seen in [facility] [place]  Her pain is in the lower back lateral leg anterior thigh and medial thigh.  S/p l fusion  PROCEDURE:  Procedure(s): CENTRAL LAMINECTOMY LUMBAR FOUR-FIVE  AND LUMBAR FIVE-SACRAL ONE,TRANSFORAMINAL LUMBAR INTERBODY FUSION LEFT LUMBAR FIVE-SACRAL ONE WITH GLOBUS PEDICLE SCREWS, RODS AND SABLE CAGE, LOCAL BONE GRAFT, ALLOGRAFT BONE GRAFT  * COMPLICATIONS:  Small 1mm left shoulder of L5 nerve root dural tear, closed with a single 4.0 neurolon suture and tusseal.   Current Outpatient Medications  Medication Instructions   acetaminophen (TYLENOL) 650 mg, Oral, Every 4 hours PRN   amLODipine (NORVASC) 10 mg, Oral, Daily   Azelastine HCl 0.15 % SOLN 1 spray, Each Nare, Daily   Continuous Blood Gluc Receiver (FREESTYLE LIBRE 2 READER) DEVI Use to check glucose as directed.   Continuous Blood Gluc Sensor (FREESTYLE LIBRE 2 SENSOR) MISC 2 each, Does not apply, Every 14 days   docusate sodium (COLACE) 100 mg, Oral, 2 times daily   fluticasone (FLONASE) 50 MCG/ACT nasal spray 2 sprays, Each Nare, Daily   Fluticasone Furoate (ARNUITY ELLIPTA) 200 MCG/ACT AEPB 1 puff, Inhalation, Daily   furosemide (LASIX) 20 mg, Oral, Every other day, Takes 40 mg every other day. On alternate days take 80 mg   hydrALAZINE (APRESOLINE) 50 mg, Oral, 2 times daily   HYDROcodone-acetaminophen (NORCO) 5-325 MG tablet 1 tablet, Oral, 2 times daily PRN   Insulin Pen Needle 31G X 6 MM MISC  1 each, Does not apply, Daily   lidocaine (LIDODERM) 5 % 1 patch, Transdermal, Every 24 hours   losartan (COZAAR) 25 mg, Oral, Daily   mirabegron ER (MYRBETRIQ) 50 MG TB24 tablet TAKE 1 TABLET BY MOUTH DAILY AT  9 AM   ondansetron (ZOFRAN) 4 mg, Oral, Every 8 hours PRN   OVER THE COUNTER MEDICATION Prevagine one per day for memory.   pantoprazole (PROTONIX) 40 mg, Oral, 2 times daily before meals   polyethylene glycol (MIRALAX / GLYCOLAX) 17 g, Oral, Daily   polyethylene glycol powder (GLYCOLAX/MIRALAX) 17 GM/SCOOP powder Take 1 capful daily as needed for constipation.   sucralfate (CARAFATE) 1 g, Oral, 3 times daily, May crush and mix with water if needed.   Tyler Aas FlexTouch 50 Units, Subcutaneous, Daily at bedtime   TRULICITY 3 0000000 SOPN INJECT THE CONTENTS OF ONE PEN  SUBCUTANEOUSLY WEEKLY AS  DIRECTED    BP (!) 150/96   Pulse 88   Ht 5\' 3"  (1.6 m)   Wt 170 lb (77.1 kg)   BMI 30.11 kg/m   Physical Exam Vitals and nursing note reviewed.  Constitutional:      Appearance: Normal appearance.  HENT:     Head: Normocephalic and atraumatic.  Eyes:     General: No scleral icterus.       Right eye: No discharge.        Left eye: No discharge.  Extraocular Movements: Extraocular movements intact.     Conjunctiva/sclera: Conjunctivae normal.     Pupils: Pupils are equal, round, and reactive to light.  Cardiovascular:     Rate and Rhythm: Normal rate.     Pulses: Normal pulses.  Skin:    General: Skin is warm and dry.     Capillary Refill: Capillary refill takes less than 2 seconds.  Neurological:     General: No focal deficit present.     Mental Status: She is alert and oriented to person, place, and time.     Gait: Gait abnormal.  Psychiatric:        Mood and Affect: Mood normal.        Behavior: Behavior normal.        Thought Content: Thought content normal.        Judgment: Judgment normal.    Examination reveals tenderness in the lower back on the left side  also down the lateral side of the left leg down to the knee area.  She has 135 degrees of hip flexion without pain she does have some pain in the hip with internal and external rotation which is in the groin but this is a minor portion of her symptoms  Right knee no effusion full range of motion normal strength no muscle tone abnormalities no atrophy no meniscal signs no ligamentous instability, tenderness noted on the lateral anterior joint line.  Imaging of the right knee showed valgus alignment maybe 1 or 2 degrees more than normal without any signs of osteophyte formation or sclerosis minimal if any joint space narrowing.  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 Herbie Baltimore office indicating the patient's pain, assessment which indicated osteoarthritis and recommendation for ultrasound  I also reviewed the operative report from neurosurgery  Previous x-ray also Evaluated.  My interpretation is that the left hip has mild OA   Encounter Diagnoses  Name Primary?   Chronic pain of right knee    Chronic left hip pain    Radiculopathy, lumbar region Yes    Right knee I did not find any right knee I did not find any physical or radiologic findings to suggest anything more than topical liniment ointment Bengay type treatment for that  As far as her left hip and groin and lateral leg pain recommend she see neurosurgery again for possible injections for radiculopathy

## 2022-09-26 ENCOUNTER — Other Ambulatory Visit (INDEPENDENT_AMBULATORY_CARE_PROVIDER_SITE_OTHER): Payer: 59

## 2022-09-26 ENCOUNTER — Encounter: Payer: Self-pay | Admitting: Orthopedic Surgery

## 2022-09-26 ENCOUNTER — Ambulatory Visit (INDEPENDENT_AMBULATORY_CARE_PROVIDER_SITE_OTHER): Payer: 59 | Admitting: Orthopedic Surgery

## 2022-09-26 VITALS — BP 148/79 | HR 85 | Ht 63.0 in | Wt 170.0 lb

## 2022-09-26 DIAGNOSIS — M4316 Spondylolisthesis, lumbar region: Secondary | ICD-10-CM

## 2022-09-26 DIAGNOSIS — M48062 Spinal stenosis, lumbar region with neurogenic claudication: Secondary | ICD-10-CM

## 2022-09-26 DIAGNOSIS — M5416 Radiculopathy, lumbar region: Secondary | ICD-10-CM

## 2022-09-26 NOTE — Progress Notes (Addendum)
Orthopedic Spine Surgery Office Note  Assessment: Patient is a 77 y.o. female with low back pain that radiates into her left anterior thigh   Plan: -Recommended IM toradol shot (procedure note below), referral to Dr. Ernestina Patches for left L4/5 transforaminal injection, pain management referral -If surgery is ever considered as a treatment option, patient A1c would need to be 7.5 or less -Patient should return to office on an as needed basis   Patient expressed understanding of the plan and all questions were answered to the patient's satisfaction.   Intramuscular toradol injection note: After discussing the risks, benefits, and alternatives of intramuscular toradol injection, patient elected to proceed. The left buttock was sterilized with alcohol based prep. The area was anesthestized with ethyl chloride. A 20 gauge needle was inject 30mg  of toradol into the left gluteus muscle under standard sterile technique. Needle was withdrawn and band aid was applied. Patient tolerated the procedure well.   ___________________________________________________________________________   History:  Patient is a 77 y.o. female who presents today for lumbar spine.  Patient underwent L4-S1 laminectomy with L5/S1 TLIF and PSIF on 03/15/2021.  She had done well postoperatively and noted improvement in her pain but then had a fall about 6 months after surgery and after the fall noted worsening pain.  Pain is felt in her low back and radiates into her left anterior thigh.  It is felt on a daily basis.  She rates the pain as a 7 out of 10.  She has tried hip injections but does not get any relief with those.  She feels the pain is worse with activity and improves with rest but is still significant even at rest.  Pain has not really improved since the fall in March 2023.  No right-sided symptoms.  Has a history of urinary incontinence that she said started after the surgery and has not changed since surgery.  No saddle  anesthesia.   Weakness: yes, left leg feels weaker. No other weakness noted Symptoms of imbalance: yes, chronic for the last several years. Uses a walker. No recent changes Hand dexterity problems: denies Paresthesias and numbness: bilateral numbness/paresthesias in feet Bowel or bladder incontinence: yes, has had urinary incontinence since her lumbar surgery. Uses depends. No recent changes.  Saddle anesthesia: denies  Review of systems: Denies fevers and chills, night sweats, unexplained weight loss. Has history of breast cancer. Has had pain that wakes her at night  Past medical history: GERD Breast cancer Asthma  Depression/Anxiety Chronic low back pain DM (A1C was 8.3 on 04/20/2022) OSA HTN CKD  Allergies: codeine, carvedilol, HCTZ, lisinopril, oxybutynin, penicillin  Past surgical history:  Hysterectomy L4/5 laminectomy, L5/S1 TLIF and PSIF Breast lumpectomy Thyroidectomy  Social history: Denies use of nicotine product (smoking, vaping, patches, smokeless) Alcohol use: denies Denies recreational drug use  Physical Exam:  BMI of 30.1  General: no acute distress, appears stated age Neurologic: alert, answering questions appropriately, following commands Respiratory: unlabored breathing on room air, symmetric chest rise Psychiatric: appropriate affect, normal cadence to speech   MSK (spine):  -Strength exam      Left  Right EHL    4/5  4/5 TA    5/5  5/5 GSC    5/5  5/5 Knee extension  5/5  5/5 Hip flexion   5/5  5/5  -Sensory exam    Sensation intact to light touch in L3-S1 nerve distributions of bilateral lower extremities  -Achilles DTR: 1/4 on the left, 1/4 on the right -Patellar tendon DTR:  1/4 on the left, 1/4 on the right  -Straight leg raise: negative -Contralateral straight leg raise: negative -Femoral nerve stretch test: negative bilaterally -Clonus: no beats bilaterally  -Left hip exam: positive FADIR, negative FABER, negative  stinchfield -Right hip exam: negative FABER, negative FADIR, negative stinchfield  Imaging: XR of the lumbar spine from 09/26/2022 was independently reviewed and interpreted, showing posterior instrumentation into L5 and S1 with interbody device in the disc base.  Interbody cage appears in similar position to prior films.  No lucency around the screws.  Screws are not backed out.  No fracture or dislocation seen.  Disc heights maintained above fusion.  MRI of the lumbar spine from 10/11/2021 was independently reviewed and interpreted, showing minimal lateral recess stenosis at L4-5 (worse on the left).  Prior decompression at L5/S1 with no residual stenosis.  Instrumentation in place there is not appear to be any breach though CT is a better imaging modality for that.   Patient name: Stephanie Moreno Patient MRN: [id number] Date of visit: 09/26/22

## 2022-10-03 ENCOUNTER — Ambulatory Visit (INDEPENDENT_AMBULATORY_CARE_PROVIDER_SITE_OTHER): Payer: 59 | Admitting: Nurse Practitioner

## 2022-10-03 ENCOUNTER — Encounter: Payer: Self-pay | Admitting: Nurse Practitioner

## 2022-10-03 VITALS — BP 130/74 | HR 89 | Ht 63.0 in | Wt 169.8 lb

## 2022-10-03 DIAGNOSIS — I10 Essential (primary) hypertension: Secondary | ICD-10-CM | POA: Diagnosis not present

## 2022-10-03 DIAGNOSIS — E782 Mixed hyperlipidemia: Secondary | ICD-10-CM | POA: Diagnosis not present

## 2022-10-03 DIAGNOSIS — N1832 Chronic kidney disease, stage 3b: Secondary | ICD-10-CM | POA: Diagnosis not present

## 2022-10-03 DIAGNOSIS — E1122 Type 2 diabetes mellitus with diabetic chronic kidney disease: Secondary | ICD-10-CM | POA: Diagnosis not present

## 2022-10-03 MED ORDER — TRESIBA FLEXTOUCH 100 UNIT/ML ~~LOC~~ SOPN: 50.0000 [IU] | PEN_INJECTOR | Freq: Every day | SUBCUTANEOUS | 3 refills | Status: DC

## 2022-10-03 MED ORDER — TRULICITY 3 MG/0.5ML ~~LOC~~ SOAJ: 3.0000 mg | SUBCUTANEOUS | 3 refills | Status: DC

## 2022-10-03 NOTE — Progress Notes (Signed)
10/03/2022, 2:56 PM  Endocrinology follow-up note   Subjective:    Patient ID: Stephanie Moreno, female    DOB: 29-May-1946.  Stephanie Moreno is being seen in follow-up after she was seen in consultation for management of currently uncontrolled symptomatic diabetes requested by  Celene Squibb, MD.   Past Medical History:  Diagnosis Date   Acid reflux disease    Anemia    Anxiety    Asthma    Breast cancer    left side   Chronic back pain    Chronic kidney disease    Depression    Diabetes mellitus without complication    Diverticulitis    Hypertension    Lumbosacral spondylosis    Per previous PCP records   Sleep apnea    Thyroid disease     Past Surgical History:  Procedure Laterality Date   ABDOMINAL HYSTERECTOMY     BACK SURGERY     BALLOON DILATION N/A 04/24/2022   Procedure: BALLOON DILATION;  Surgeon: Eloise Harman, DO;  [facility]: [facility];  Service: Endoscopy;  Laterality: N/A;   BIOPSY  02/19/2020   Procedure: BIOPSY;  Surgeon: Eloise Harman, DO;  [facility]: [facility];  Service: Endoscopy;;   BIOPSY  04/24/2022   Procedure: BIOPSY;  Surgeon: Eloise Harman, DO;  Location: [facility];  Service: Endoscopy;;   BREAST LUMPECTOMY Left 2003   BREAST SURGERY     COLONOSCOPY WITH PROPOFOL N/A 02/19/2020   non-bleeding internal hemorrhoids, many small-mouthed diverticula in entire colon.   ESOPHAGOGASTRODUODENOSCOPY (EGD) WITH PROPOFOL N/A 02/19/2020    benign-appearing esophageal stenosis s/p dilation, gastritis s/p biopsy, normal duodenum. Negative H.pylori, negative duodenal biopsy.    ESOPHAGOGASTRODUODENOSCOPY (EGD) WITH PROPOFOL N/A 04/24/2022   small hiatal hernia, mild Schatzki ring, s/p dilation. Gastritis s/p biopsy. Negative H.pylori.   GIVENS CAPSULE STUDY N/A 06/14/2020   Procedure: GIVENS CAPSULE STUDY;  Surgeon: Eloise Harman, DO;  Location: [facility];  Service: Endoscopy;  Laterality: N/A;  7:30am    THYROIDECTOMY     tummy tuck     at time of breast surgery 2003    Social History   Socioeconomic History   Marital status: Divorced    Spouse name: Not on file   Number of children: Not on file   Years of education: Not on file   Highest education level: Not on file  Occupational History   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 [place]  Tobacco Use   Smoking status: Never   Smokeless tobacco: Never  Vaping Use   Vaping Use: Never used  Substance and Sexual Activity   Alcohol use: Not Currently   Drug use: Not Currently   Sexual activity: Not on file  Other Topics Concern   Not on file  Social History Narrative   Not on file   Social Determinants of Health   Financial Resource Strain: Not on file  Food Insecurity: Not on file  Transportation Needs: Not on file  Physical Activity: Not on file  Stress: Not on file  Social Connections: Not on file    Family History  Problem Relation Age of Onset   Diabetes Mother    Thyroid disease Mother    Colon cancer Neg Hx     Outpatient Encounter Medications as of 10/03/2022  Medication Sig   acetaminophen (TYLENOL) 325 MG tablet Take 650 mg by mouth every  4 (four) hours as needed.   amLODipine (NORVASC) 10 MG tablet Take 10 mg by mouth daily.   Azelastine HCl 0.15 % SOLN Place 1 spray into both nostrils daily.   Continuous Blood Gluc Receiver (FREESTYLE LIBRE 2 READER) DEVI Use to check glucose as directed.   Continuous Blood Gluc Sensor (FREESTYLE LIBRE 2 SENSOR) MISC 2 each by Does not apply route every 14 (fourteen) days.   docusate sodium (COLACE) 100 MG capsule Take 1 capsule (100 mg total) by mouth 2 (two) times daily.   FARXIGA 5 MG TABS tablet Take 5 mg by mouth. Take 1 by mouth on Monday, Wednesday and Friday   fluticasone (FLONASE) 50 MCG/ACT nasal spray Place 2 sprays into both nostrils daily.   Fluticasone Furoate (ARNUITY ELLIPTA) 200 MCG/ACT AEPB Inhale 1 puff into the lungs daily.    furosemide (LASIX) 20 MG tablet Take 1 tablet (20 mg total) by mouth every other day. Takes 40 mg every other day. On alternate days take 80 mg (Patient taking differently: Take 20 mg by mouth daily.)   hydrALAZINE (APRESOLINE) 50 MG tablet Take 50 mg by mouth 2 (two) times daily.   HYDROcodone-acetaminophen (NORCO) 5-325 MG tablet Take 1 tablet by mouth 2 (two) times daily as needed.   Insulin Pen Needle 31G X 6 MM MISC 1 each by Does not apply route daily.   lidocaine (LIDODERM) 5 % Place 1 patch onto the skin daily.   losartan (COZAAR) 25 MG tablet Take 25 mg by mouth daily.   mirabegron ER (MYRBETRIQ) 50 MG TB24 tablet TAKE 1 TABLET BY MOUTH DAILY AT  9 AM   ondansetron (ZOFRAN) 4 MG tablet Take 1 tablet (4 mg total) by mouth every 8 (eight) hours as needed for nausea or vomiting.   OVER THE COUNTER MEDICATION Prevagine one per day for memory.   pantoprazole (PROTONIX) 40 MG tablet Take 1 tablet (40 mg total) by mouth 2 (two) times daily before a meal.   polyethylene glycol (MIRALAX / GLYCOLAX) 17 g packet Take 17 g by mouth daily.   polyethylene glycol powder (GLYCOLAX/MIRALAX) 17 GM/SCOOP powder Take 1 capful daily as needed for constipation.   sucralfate (CARAFATE) 1 g tablet Take 1 tablet (1 g total) by mouth in the morning, at noon, and at bedtime. May crush and mix with water if needed.   [DISCONTINUED] insulin degludec (TRESIBA FLEXTOUCH) 100 UNIT/ML FlexTouch Pen Inject 50 Units into the skin at bedtime.   [DISCONTINUED] TRULICITY 3 0000000 SOPN INJECT THE CONTENTS OF ONE PEN  SUBCUTANEOUSLY WEEKLY AS  DIRECTED   insulin degludec (TRESIBA FLEXTOUCH) 100 UNIT/ML FlexTouch Pen Inject 50 Units into the skin at bedtime.   TRULICITY 3 0000000 SOPN Inject 3 mg into the skin once a week.   No facility-administered encounter medications on file as of 10/03/2022.    ALLERGIES: Allergies  Allergen Reactions   Codeine Nausea And Vomiting and Anaphylaxis   Carvedilol     Cold sweats,  diarrhea, severe hot flashes   Hydrochlorothiazide Other (See Comments)    Other reaction(s): stomach upset   Lisinopril Nausea And Vomiting    Other reaction(s): cough   Oxybutynin Other (See Comments)   Penicillins Hives    VACCINATION STATUS: Immunization History  Administered Date(s) Administered   Fluad Quad(high Dose 65+) 03/17/2021   Pneumococcal Conjugate-13 09/02/2014   Pneumococcal Polysaccharide-23 07/03/2010   Tdap 07/03/2000    Diabetes She presents for her follow-up diabetic visit. She has type 2 diabetes mellitus. Onset  time: Diagnosed at approx age of 37. Her disease course has been improving. There are no hypoglycemic associated symptoms. Associated symptoms include fatigue and foot paresthesias. Pertinent negatives for diabetes include no polydipsia, no polyuria and no weight loss. There are no hypoglycemic complications. Symptoms are stable. Diabetic complications include a CVA, nephropathy and peripheral neuropathy. Risk factors for coronary artery disease include diabetes mellitus, dyslipidemia, hypertension, post-menopausal and sedentary lifestyle. Current diabetic treatment includes oral agent (monotherapy) and insulin injections (and Trulicity). She is compliant with treatment most of the time. Her weight is fluctuating minimally. She is following a generally healthy diet. When asked about meal planning, she reported none. She has not had a previous visit with a dietitian. She rarely participates in exercise. Her home blood glucose trend is decreasing steadily. Her overall blood glucose range is 140-180 mg/dl. (She presents today with her CGM showing improved glycemic profile overall.  Her most recent A1c from 3/14 was 8.1%, improving from last visit of 8.3%.  Analysis of her CGM shows TIR 89%, TAR 11%, TBR 0% with a GMI of 6.4%.  She denies any hypoglycemia.  Her nephrologist did just start her on Farxiga 5 mg po on MWF.  She did note increase in urinary frequency and  stress incontinence as a result.) An ACE inhibitor/angiotensin II receptor blocker is being taken. She does not see a podiatrist.Eye exam is current.  Hypertension This is a chronic problem. The current episode started more than 1 year ago. The problem is unchanged. The problem is controlled. Associated symptoms include peripheral edema. Agents associated with hypertension include thyroid hormones. Risk factors for coronary artery disease include diabetes mellitus, dyslipidemia, post-menopausal state and sedentary lifestyle. Past treatments include diuretics, beta blockers, ACE inhibitors and angiotensin blockers. There are no compliance problems.  Hypertensive end-organ damage includes kidney disease and CVA. Identifiable causes of hypertension include chronic renal disease, sleep apnea and a thyroid problem.  Hyperlipidemia This is a chronic problem. The current episode started more than 1 year ago. The problem is controlled. Recent lipid tests were reviewed and are normal. Exacerbating diseases include chronic renal disease and hypothyroidism. Factors aggravating her hyperlipidemia include beta blockers. Current antihyperlipidemic treatment includes statins. The current treatment provides moderate improvement of lipids. There are no compliance problems.  Risk factors for coronary artery disease include diabetes mellitus, dyslipidemia, hypertension, obesity, post-menopausal and a sedentary lifestyle.     Review of systems  Constitutional: + minimally fluctuating body weight,  current Body mass index is 30.08 kg/m. , + fatigue, no subjective hyperthermia, no subjective hypothermia Eyes: no blurry vision, no xerophthalmia ENT: no sore throat, no nodules palpated in throat, no dysphagia/odynophagia, no hoarseness Cardiovascular: no chest pain, no shortness of breath, no palpitations Respiratory: no cough, no shortness of breath Gastrointestinal: no nausea/vomiting/diarrhea Genitourinary: + polyuria,  stress incontinence (recently started on Farxiga by nephrology) Musculoskeletal: ongoing back pain-will be getting injections soon, walks with walker due to previous CVA and disequilibrium- has frequent falls Skin: no rashes, no hyperemia Neurological: no tremors, no dizziness, + numbness/tingling to BLE (on gabapentin) Psychiatric: no depression, no anxiety  Objective:     BP 130/74 (BP Location: Left Arm, Patient Position: Sitting, Cuff Size: Normal)   Pulse 89   Ht 5\' 3"  (1.6 m)   Wt 169 lb 12.8 oz (77 kg)   BMI 30.08 kg/m   Wt Readings from Last 3 Encounters:  10/03/22 169 lb 12.8 oz (77 kg)  09/26/22 170 lb (77.1 kg)  09/21/22 170 lb (77.1  kg)    BP Readings from Last 3 Encounters:  10/03/22 130/74  09/26/22 (!) 148/79  09/21/22 (!) 150/96     Physical Exam- Limited  Constitutional:  Body mass index is 30.08 kg/m. , not in acute distress, normal state of mind Eyes:  EOMI, no exophthalmos Musculoskeletal: no gross deformities,  walks with walker due to disequilibrium and frequent falls Skin:  no rashes, no hyperemia Neurological: no tremor with outstretched hands   Diabetic Foot Exam - Simple   No data filed     CMP ( most recent) CMP     Component Value Date/Time   NA 141 07/25/2022 1232   K 4.2 07/25/2022 1232   CL 105 07/25/2022 1232   CO2 17 (L) 07/25/2022 1232   GLUCOSE 102 (H) 07/25/2022 1232   GLUCOSE 185 (H) 04/24/2022 1251   BUN 16 07/25/2022 1232   CREATININE 1.58 (H) 07/25/2022 1232   CREATININE 1.42 (H) 10/12/2020 1009   CALCIUM 9.7 07/25/2022 1232   PROT 8.1 07/25/2022 1232   ALBUMIN 4.5 07/25/2022 1232   AST 22 07/25/2022 1232   ALT 19 07/25/2022 1232   ALKPHOS 102 07/25/2022 1232   BILITOT 0.3 07/25/2022 1232   GFRNONAA 35 (L) 04/19/2022 1045   GFRNONAA 36 (L) 10/12/2020 1009   GFRAA 42 (L) 10/12/2020 1009     Diabetic Labs (most recent): Lab Results  Component Value Date   HGBA1C 8.3 (A) 04/20/2022   HGBA1C 8.7 12/19/2021    HGBA1C 7.8 (A) 09/16/2021   MICROALBUR 80 10/26/2020     Lipid Panel ( most recent) Lipid Panel     Component Value Date/Time   CHOL 91 04/19/2021 0000   TRIG 78 04/19/2021 0000   HDL 42 04/19/2021 0000   CHOLHDL 2.0 10/12/2020 1009   LDLCALC 34 04/19/2021 0000   LDLCALC 46 10/12/2020 1009      Lab Results  Component Value Date   TSH 3.94 10/12/2020   TSH 1.18 03/09/2020      Assessment & Plan:   1) Type 2 diabetes mellitus with stage 3b chronic kidney disease, without long-term current use of insulin ([place])  - Stephanie Moreno has currently uncontrolled symptomatic type 2 DM since  77 years of age.  She presents today with her CGM showing improved glycemic profile overall.  Her most recent A1c from 3/14 was 8.1%, improving from last visit of 8.3%.  Analysis of her CGM shows TIR 89%, TAR 11%, TBR 0% with a GMI of 6.4%.  She denies any hypoglycemia.  Her nephrologist did just start her on Farxiga 5 mg po on MWF.  She did note increase in urinary frequency and stress incontinence as a result.   Recent labs reviewed.    - I had a long discussion with her about the progressive nature of diabetes and the pathology behind its complications. -her diabetes is complicated by CKD, CVA and she remains at a high risk for more acute and chronic complications which include CAD, CVA, CKD, retinopathy, and neuropathy. These are all discussed in detail with her.  - Nutritional counseling repeated at each appointment due to patients tendency to fall back in to old habits.  - The patient admits there is a room for improvement in their diet and drink choices. -  Suggestion is made for the patient to avoid simple carbohydrates from their diet including Cakes, Sweet Desserts / Pastries, Ice Cream, Soda (diet and regular), Sweet Tea, Candies, Chips, Cookies, Sweet Pastries, Store Bought Juices, Alcohol  in Excess of 1-2 drinks a day, Artificial Sweeteners, Coffee Creamer, and "Sugar-free" Products. This  will help patient to have stable blood glucose profile and potentially avoid unintended weight gain.   - I encouraged the patient to switch to unprocessed or minimally processed complex starch and increased protein intake (animal or plant source), fruits, and vegetables.   - Patient is advised to stick to a routine mealtimes to eat 3 meals a day and avoid unnecessary snacks (to snack only to correct hypoglycemia).  - she will be scheduled with Jearld Fenton, RDN, CDE for diabetes education.  - I have approached her with the following individualized plan to manage  her diabetes and patient agrees:   - she will need at least basal insulin in order for her to achieve control of diabetes to target, and she accepts this treatment option at this time.    -Based on her stable and improving glycemic profile and lack of hypoglycemia, no changes will be made to her medication regimen today.  She is advised to continue her Tresiba 50 units SQ nightly, Glipizide 5 mg XL daily with breakfast, and continue her Trulicity 3 mg SQ weekly.  She can continue her Farxiga 5 mg on MWF as prescribed by nephrology.  I did go over side effects of this medication to watch out for.  -She is encouraged to continue monitoring blood glucose twice daily (using her CGM), before breakfast and before bed, and to call the clinic if she has readings less than 70 or greater than 300 for 3 tests in a row.   - Specific targets for  A1c;  LDL, HDL,  and Triglycerides were discussed with the patient.  2) Blood Pressure /Hypertension:   Her blood pressure is controlled to target for her age.   she is advised to continue her current medications including Losartan 25 mg p.o. daily with breakfast, and Lasix 20 every other day.  Will defer changes to nephrologist.  3) Lipids/Hyperlipidemia:    Review of her recent lipid panel from 09/14/22 showed controlled LDL at 45.  she  is advised to continue Crestor 10 mg p.o. daily at bedtime.   Side  effects and precautions discussed with her.  4)  Weight/Diet:  Her Body mass index is 30.08 kg/m.  -   clearly complicating her diabetes care.   she is  a candidate for weight loss. I discussed with her the fact that loss of 5 - 10% of her  current body weight will have the most impact on her diabetes management.  Exercise, and detailed carbohydrates information provided  -  detailed on discharge instructions.  5) Chronic Care/Health Maintenance: -she  is on ACEI/ARB and Statin medications and  is encouraged to initiate and continue to follow up with Ophthalmology, Dentist,  Podiatrist at least yearly or according to recommendations, and advised to  stay away from smoking. I have recommended yearly flu vaccine and pneumonia vaccine at least every 5 years; she cannot exercise optimally due to her disequilibrium.  She is advised to sleep for at least 7 hours a day.  - she is  advised to maintain close follow up with Celene Squibb, MD for primary care needs, as well as her other providers for optimal and coordinated care.      I spent  48  minutes in the care of the patient today including review of labs from [place], Lipids, Thyroid Function, Hematology (current and previous including abstractions from other facilities); face-to-face time discussing  her blood glucose readings/logs, discussing hypoglycemia and hyperglycemia episodes and symptoms, medications doses, her options of short and long term treatment based on the latest standards of care / guidelines;  discussion about incorporating lifestyle medicine;  and documenting the encounter. Risk reduction counseling performed per USPSTF guidelines to reduce obesity and cardiovascular risk factors.     Please refer to Patient Instructions for Blood Glucose Monitoring and Insulin/Medications Dosing Guide"  in media tab for additional information. Please  also refer to " Patient Self Inventory" in the Media  tab for reviewed elements of pertinent patient  history.  Stephanie Moreno participated in the discussions, expressed understanding, and voiced agreement with the above plans.  All questions were answered to her satisfaction. she is encouraged to contact clinic should she have any questions or concerns prior to her return visit.   Follow up plan: - Return in about 3 months (around 01/02/2023) for Diabetes F/U with A1c in office, No previsit labs, Bring meter and logs.  Rayetta Pigg, [facility] [address] [place], [place] [address] Phone: [phone] Fax: [phone]  10/03/2022, 2:56 PM

## 2022-10-13 ENCOUNTER — Telehealth: Payer: Self-pay | Admitting: Physical Medicine and Rehabilitation

## 2022-10-13 NOTE — Telephone Encounter (Signed)
Spoke with patient and she has [employer] now as of 10/02/22. [employer] member number is [id number]. Customer service #- [phone]

## 2022-10-13 NOTE — Telephone Encounter (Signed)
Patient asking for an injection and appointment with Dr. Alvester Morin, 2nd call

## 2022-10-18 ENCOUNTER — Ambulatory Visit: Payer: Medicare Other | Admitting: Urology

## 2022-10-19 ENCOUNTER — Other Ambulatory Visit: Payer: Self-pay

## 2022-10-19 ENCOUNTER — Ambulatory Visit (INDEPENDENT_AMBULATORY_CARE_PROVIDER_SITE_OTHER): Payer: Medicare HMO | Admitting: Physical Medicine and Rehabilitation

## 2022-10-19 VITALS — BP 168/82 | HR 78

## 2022-10-19 DIAGNOSIS — M5416 Radiculopathy, lumbar region: Secondary | ICD-10-CM

## 2022-10-19 DIAGNOSIS — R6889 Other general symptoms and signs: Secondary | ICD-10-CM | POA: Diagnosis not present

## 2022-10-19 MED ORDER — METHYLPREDNISOLONE ACETATE 80 MG/ML IJ SUSP
80.0000 mg | Freq: Once | INTRAMUSCULAR | Status: AC
  Administered 2022-10-19: 80 mg

## 2022-10-19 NOTE — Patient Instructions (Signed)

## 2022-10-19 NOTE — Progress Notes (Signed)
Functional Pain Scale - descriptive words and definitions  Unmanageable (7)  Pain interferes with normal ADL's/nothing seems to help/sleep is very difficult/active distractions are very difficult to concentrate on. Severe range order  Average Pain 6-7   +Driver, -BT, -Dye Allergies.  Lower back pain on left side that radiates down the left leg, some radiation in the right leg to the knee

## 2022-10-26 DIAGNOSIS — R6889 Other general symptoms and signs: Secondary | ICD-10-CM | POA: Diagnosis not present

## 2022-10-26 DIAGNOSIS — R350 Frequency of micturition: Secondary | ICD-10-CM | POA: Diagnosis not present

## 2022-10-26 DIAGNOSIS — R32 Unspecified urinary incontinence: Secondary | ICD-10-CM | POA: Diagnosis not present

## 2022-10-29 NOTE — Progress Notes (Signed)
Stephanie Moreno - 77 y.o. female MRN [id number]  Date of birth: March 18, 1946  Office Visit Note: Visit Date: 10/19/2022 PCP: Benita Stabile, MD Referred by: London Sheer, MD  Subjective: Chief Complaint  Patient presents with   Lower Back - Pain   HPI:  Stephanie Moreno is a 77 y.o. female who comes in today at the request of Dr. Willia Craze for planned Left L4-5 Lumbar Transforaminal epidural steroid injection with fluoroscopic guidance.  The patient has failed conservative care including home exercise, medications, time and activity modification.  This injection will be diagnostic and hopefully therapeutic.  Please see requesting physician notes for further details and justification.   ROS Otherwise per HPI.  Assessment & Plan: Visit Diagnoses:    ICD-10-CM   1. Lumbar radiculopathy  M54.16 XR C-ARM NO REPORT    Epidural Steroid injection    methylPREDNISolone acetate (DEPO-MEDROL) injection 80 mg      Plan: No additional findings.   Meds & Orders:  Meds ordered this encounter  Medications   methylPREDNISolone acetate (DEPO-MEDROL) injection 80 mg    Orders Placed This Encounter  Procedures   XR C-ARM NO REPORT   Epidural Steroid injection    Follow-up: Return for visit to requesting provider as needed.   Procedures: No procedures performed  Lumbosacral Transforaminal Epidural Steroid Injection - Sub-Pedicular Approach with Fluoroscopic Guidance  Patient: Stephanie Moreno      Date of Birth: May 03, 1946 MRN: [id number] PCP: Benita Stabile, MD      Visit Date: 10/19/2022   Universal Protocol:    Date/Time: 10/19/2022  Consent Given By: the patient  Position: PRONE  Additional Comments: Vital signs were monitored before and after the procedure. Patient was prepped and draped in the usual sterile fashion. The correct patient, procedure, and site was verified.   Injection Procedure Details:   Procedure diagnoses: Lumbar radiculopathy [M54.16]    Meds Administered:   Meds ordered this encounter  Medications   methylPREDNISolone acetate (DEPO-MEDROL) injection 80 mg    Laterality: Left  Location/Site: L4  Needle:5.0 in., 22 ga.  Short bevel or Quincke spinal needle  Needle Placement: Transforaminal  Findings:    -Comments: Excellent flow of contrast along the nerve, nerve root and into the epidural space.  Procedure Details: After squaring off the end-plates to get a true AP view, the C-arm was positioned so that an oblique view of the foramen as noted above was visualized. The target area is just inferior to the "nose of the scotty dog" or sub pedicular. The soft tissues overlying this structure were infiltrated with 2-3 ml. of 1% Lidocaine without Epinephrine.  The spinal needle was inserted toward the target using a "trajectory" view along the fluoroscope beam.  Under AP and lateral visualization, the needle was advanced so it did not puncture dura and was located close the 6 O'Clock position of the pedical in AP tracterory. Biplanar projections were used to confirm position. Aspiration was confirmed to be negative for CSF and/or blood. A 1-2 ml. volume of Isovue-250 was injected and flow of contrast was noted at each level. Radiographs were obtained for documentation purposes.   After attaining the desired flow of contrast documented above, a 0.5 to 1.0 ml test dose of 0.25% Marcaine was injected into each respective transforaminal space.  The patient was observed for 90 seconds post injection.  After no sensory deficits were reported, and normal lower extremity motor function was noted,   the above injectate  was administered so that equal amounts of the injectate were placed at each foramen (level) into the transforaminal epidural space.   Additional Comments:  No complications occurred Dressing: 2 x 2 sterile gauze and Band-Aid    Post-procedure details: Patient was observed during the procedure. Post-procedure instructions were  reviewed.  Patient left the clinic in stable condition.    Clinical History: MRI LUMBAR SPINE WITHOUT AND WITH CONTRAST   TECHNIQUE: Multiplanar and multiecho pulse sequences of the lumbar spine were obtained without and with intravenous contrast.   CONTRAST:  17mL MULTIHANCE GADOBENATE DIMEGLUMINE 529 MG/ML IV SOLN   COMPARISON:  Lumbar MRI 10/22/2020   FINDINGS: Segmentation:  5 lumbar vertebra   Alignment:  Normal   Vertebrae: Normal bone marrow. Negative for fracture or mass. No evidence of spinal infection.   Conus medullaris and cauda equina: Conus extends to the L2-3 level. Conus and cauda equina appear normal.   Paraspinal and other soft tissues: Bilateral renal cysts. No paraspinous mass, adenopathy, or fluid collection   Disc levels:   L1-2: Negative   L2-3: Negative   L3-4: Mild facet degeneration. Negative for disc protrusion or stenosis   L4-5: Shallow central disc protrusion unchanged. Bilateral facet hypertrophy. Mild spinal stenosis improved likely due to laminotomy. Mild subarticular stenosis bilaterally.   L5-S1: Pedicle screw and interbody fusion. Negative for spinal or foraminal stenosis.   IMPRESSION: 1. 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 Interval pedicle screw and interbody fusion L5-S1. Negative for stenosis.     Electronically Signed   By: Marlan Moreno M.D.   On: 10/11/2021 14:03     Objective:  VS:  HT:    WT:   BMI:     BP:(!) 168/82  HR:78bpm  TEMP: ( )  RESP:  Physical Exam Vitals and nursing note reviewed.  Constitutional:      General: She is not in acute distress.    Appearance: Normal appearance. She is not ill-appearing.  HENT:     Head: Normocephalic and atraumatic.     Right Ear: External ear normal.     Left Ear: External ear normal.  Eyes:     Extraocular Movements: Extraocular movements intact.  Cardiovascular:     Rate and Rhythm: Normal rate.     Pulses: Normal  pulses.  Pulmonary:     Effort: Pulmonary effort is normal. No respiratory distress.  Abdominal:     General: There is no distension.     Palpations: Abdomen is soft.  Musculoskeletal:        General: Tenderness present.     Cervical back: Neck supple.     Right lower leg: No edema.     Left lower leg: No edema.     Comments: Patient has good distal strength with no pain over the greater trochanters.  No clonus or focal weakness.  Skin:    Findings: No erythema, lesion or rash.  Neurological:     General: No focal deficit present.     Mental Status: She is alert and oriented to person, place, and time.     Sensory: No sensory deficit.     Motor: No weakness or abnormal muscle tone.     Coordination: Coordination normal.  Psychiatric:        Mood and Affect: Mood normal.        Behavior: Behavior normal.      Imaging: No results found.

## 2022-10-29 NOTE — Procedures (Signed)
Lumbosacral Transforaminal Epidural Steroid Injection - Sub-Pedicular Approach with Fluoroscopic Guidance  Patient: Stephanie Moreno      Date of Birth: 01-17-1946 MRN: [id number] PCP: Benita Stabile, MD      Visit Date: 10/19/2022   Universal Protocol:    Date/Time: 10/19/2022  Consent Given By: the patient  Position: PRONE  Additional Comments: Vital signs were monitored before and after the procedure. Patient was prepped and draped in the usual sterile fashion. The correct patient, procedure, and site was verified.   Injection Procedure Details:   Procedure diagnoses: Lumbar radiculopathy [M54.16]    Meds Administered:  Meds ordered this encounter  Medications   methylPREDNISolone acetate (DEPO-MEDROL) injection 80 mg    Laterality: Left  Location/Site: L4  Needle:5.0 in., 22 ga.  Short bevel or Quincke spinal needle  Needle Placement: Transforaminal  Findings:    -Comments: Excellent flow of contrast along the nerve, nerve root and into the epidural space.  Procedure Details: After squaring off the end-plates to get a true AP view, the C-arm was positioned so that an oblique view of the foramen as noted above was visualized. The target area is just inferior to the "nose of the scotty dog" or sub pedicular. The soft tissues overlying this structure were infiltrated with 2-3 ml. of 1% Lidocaine without Epinephrine.  The spinal needle was inserted toward the target using a "trajectory" view along the fluoroscope beam.  Under AP and lateral visualization, the needle was advanced so it did not puncture dura and was located close the 6 O'Clock position of the pedical in AP tracterory. Biplanar projections were used to confirm position. Aspiration was confirmed to be negative for CSF and/or blood. A 1-2 ml. volume of Isovue-250 was injected and flow of contrast was noted at each level. Radiographs were obtained for documentation purposes.   After attaining the desired flow of  contrast documented above, a 0.5 to 1.0 ml test dose of 0.25% Marcaine was injected into each respective transforaminal space.  The patient was observed for 90 seconds post injection.  After no sensory deficits were reported, and normal lower extremity motor function was noted,   the above injectate was administered so that equal amounts of the injectate were placed at each foramen (level) into the transforaminal epidural space.   Additional Comments:  No complications occurred Dressing: 2 x 2 sterile gauze and Band-Aid    Post-procedure details: Patient was observed during the procedure. Post-procedure instructions were reviewed.  Patient left the clinic in stable condition.

## 2022-11-01 ENCOUNTER — Other Ambulatory Visit: Payer: Self-pay | Admitting: "Endocrinology

## 2022-11-01 DIAGNOSIS — E1122 Type 2 diabetes mellitus with diabetic chronic kidney disease: Secondary | ICD-10-CM

## 2022-11-04 ENCOUNTER — Other Ambulatory Visit: Payer: Self-pay | Admitting: Gastroenterology

## 2022-11-28 ENCOUNTER — Other Ambulatory Visit: Payer: Self-pay | Admitting: Gastroenterology

## 2022-12-01 ENCOUNTER — Ambulatory Visit (INDEPENDENT_AMBULATORY_CARE_PROVIDER_SITE_OTHER): Payer: Medicare HMO | Admitting: Urology

## 2022-12-01 ENCOUNTER — Encounter: Payer: Self-pay | Admitting: Urology

## 2022-12-01 VITALS — BP 126/70 | HR 83

## 2022-12-01 DIAGNOSIS — Z87440 Personal history of urinary (tract) infections: Secondary | ICD-10-CM

## 2022-12-01 DIAGNOSIS — N3941 Urge incontinence: Secondary | ICD-10-CM | POA: Diagnosis not present

## 2022-12-01 DIAGNOSIS — R6889 Other general symptoms and signs: Secondary | ICD-10-CM | POA: Diagnosis not present

## 2022-12-01 LAB — BLADDER SCAN AMB NON-IMAGING

## 2022-12-01 LAB — URINALYSIS, ROUTINE W REFLEX MICROSCOPIC
Bilirubin, UA: NEGATIVE
Ketones, UA: NEGATIVE
Nitrite, UA: NEGATIVE
RBC, UA: NEGATIVE
Specific Gravity, UA: <1.005 — ABNORMAL LOW (ref 1.005–1.030)
Urobilinogen, Ur: 0.2 mg/dL (ref 0.2–1.0)
pH, UA: 6 (ref 5.0–7.5)

## 2022-12-01 LAB — MICROSCOPIC EXAMINATION

## 2022-12-01 MED ORDER — MIRABEGRON ER 50 MG PO TB24: ORAL_TABLET | ORAL | 3 refills | Status: DC

## 2022-12-01 NOTE — Progress Notes (Signed)
12/01/2022 10:54 AM   [name] Aug 08, 1945 [id number]  Referring provider: Benita Stabile, MD [address] [place],  [place] [address]  Followup UTi and OAb   HPI: Ms Weisman is a 77yo here for followup for OAB and UTI. No UTIs since last visit. She is currently on mirabegron 50mg  daily. She is on farxiga. She uses 3-4 pads per day. She has nocturia 3x per night which are large volumes. She is unhappy with her urinary frequency, urgency and nocturia.    PMH: Past Medical History:  Diagnosis Date   Acid reflux disease    Anemia    Anxiety    Asthma    Breast cancer (HCC)    left side   Chronic back pain    Chronic kidney disease    Depression    Diabetes mellitus without complication (HCC)    Diverticulitis    Hypertension    Lumbosacral spondylosis    Per previous PCP records   Sleep apnea    Thyroid disease     Surgical History: Past Surgical History:  Procedure Laterality Date   ABDOMINAL HYSTERECTOMY     BACK SURGERY     BALLOON DILATION N/A 04/24/2022   Procedure: BALLOON DILATION;  Surgeon: Lanelle Bal, DO;  [facility];  Service: Endoscopy;  Laterality: N/A;   BIOPSY  02/19/2020   Procedure: BIOPSY;  Surgeon: Lanelle Bal, DO;  [facility];  Service: Endoscopy;;   BIOPSY  04/24/2022   Procedure: BIOPSY;  Surgeon: Lanelle Bal, DO;  Location: [facility];  Service: Endoscopy;;   BREAST LUMPECTOMY Left 2003   BREAST SURGERY     COLONOSCOPY WITH PROPOFOL N/A 02/19/2020   non-bleeding internal hemorrhoids, many small-mouthed diverticula in entire colon.   ESOPHAGOGASTRODUODENOSCOPY (EGD) WITH PROPOFOL N/A 02/19/2020    benign-appearing esophageal stenosis s/p dilation, gastritis s/p biopsy, normal duodenum. Negative H.pylori, negative duodenal biopsy.    ESOPHAGOGASTRODUODENOSCOPY (EGD) WITH PROPOFOL N/A 04/24/2022   small hiatal hernia, mild Schatzki ring, s/p dilation. Gastritis s/p biopsy. Negative H.pylori.    GIVENS CAPSULE STUDY N/A 06/14/2020   Procedure: GIVENS CAPSULE STUDY;  Surgeon: Lanelle Bal, DO;  Location: [facility];  Service: Endoscopy;  Laterality: N/A;  7:30am   THYROIDECTOMY     tummy tuck     at time of breast surgery 2003    Home Medications:  Allergies as of 12/01/2022       Reactions   Codeine Nausea And Vomiting, Anaphylaxis   Carvedilol    Cold sweats, diarrhea, severe hot flashes   Hydrochlorothiazide Other (See Comments)   Other reaction(s): stomach upset   Lisinopril Nausea And Vomiting   Other reaction(s): cough   Oxybutynin Other (See Comments)   Penicillins Hives        Medication List        Accurate as of Dec 01, 2022 10:54 AM. If you have any questions, ask your nurse or doctor.          acetaminophen 325 MG tablet Commonly known as: TYLENOL Take 650 mg by mouth every 4 (four) hours as needed.   amLODipine 10 MG tablet Commonly known as: NORVASC Take 10 mg by mouth daily.   Arnuity Ellipta 200 MCG/ACT Aepb Generic drug: Fluticasone Furoate Inhale 1 puff into the lungs daily.   Azelastine HCl 0.15 % Soln Place 1 spray into both nostrils daily.   docusate sodium 100 MG capsule Commonly known as: COLACE  Take 1 capsule (100 mg total) by mouth 2 (two) times daily.   Farxiga 5 MG Tabs tablet Generic drug: dapagliflozin propanediol Take 5 mg by mouth. Take 1 by mouth on Monday, Wednesday and Friday   fluticasone 220 MCG/ACT inhaler Commonly known as: FLOVENT HFA Inhale 1 puff into the lungs 2 (two) times daily.   fluticasone 50 MCG/ACT nasal spray Commonly known as: FLONASE Place 2 sprays into both nostrils daily.   FreeStyle Libre 2 Reader Hardie Pulley USE TO CHECK GLUCOSE AS DIRECTED   FreeStyle Libre 2 Sensor Misc 2 each by Does not apply route every 14 (fourteen) days.   furosemide 20 MG tablet Commonly known as: LASIX Take 1 tablet (20 mg total) by mouth every other day. Takes 40 mg every other day. On alternate days  take 80 mg What changed:  when to take this additional instructions   glipiZIDE 5 MG 24 hr tablet Commonly known as: GLUCOTROL XL Take 5 mg by mouth every morning.   hydrALAZINE 50 MG tablet Commonly known as: APRESOLINE Take 50 mg by mouth 2 (two) times daily.   HYDROcodone-acetaminophen 5-325 MG tablet Commonly known as: Norco Take 1 tablet by mouth 2 (two) times daily as needed.   Insulin Pen Needle 31G X 6 MM Misc 1 each by Does not apply route daily.   lidocaine 5 % Commonly known as: LIDODERM Place 1 patch onto the skin daily.   losartan 25 MG tablet Commonly known as: COZAAR Take 25 mg by mouth daily.   mirabegron ER 50 MG Tb24 tablet Commonly known as: Myrbetriq TAKE 1 TABLET BY MOUTH DAILY AT  9 AM   ondansetron 4 MG tablet Commonly known as: ZOFRAN TAKE 1 TABLET BY MOUTH EVERY 8 HOURS AS NEEDED FOR NAUSEA AND VOMITING   OVER THE COUNTER MEDICATION Prevagine one per day for memory.   pantoprazole 40 MG tablet Commonly known as: Protonix Take 1 tablet (40 mg total) by mouth 2 (two) times daily before a meal.   polyethylene glycol 17 g packet Commonly known as: MIRALAX / GLYCOLAX Take 17 g by mouth daily.   polyethylene glycol powder 17 GM/SCOOP powder Commonly known as: GLYCOLAX/MIRALAX Take 1 capful daily as needed for constipation.   sucralfate 1 g tablet Commonly known as: CARAFATE TAKE 1 TABLET IN THE MORNING, AT NOON, AND AT BEDTIME. MAY CRUSH AND MIX WITH WATER IF NEEDED.   Evaristo Bury FlexTouch 100 UNIT/ML FlexTouch Pen Generic drug: insulin degludec Inject 50 Units into the skin at bedtime.   Trulicity 3 MG/0.5ML Sopn Generic drug: Dulaglutide Inject 3 mg into the skin once a week.        Allergies:  Allergies  Allergen Reactions   Codeine Nausea And Vomiting and Anaphylaxis   Carvedilol     Cold sweats, diarrhea, severe hot flashes   Hydrochlorothiazide Other (See Comments)    Other reaction(s): stomach upset   Lisinopril Nausea  And Vomiting    Other reaction(s): cough   Oxybutynin Other (See Comments)   Penicillins Hives    Family History: Family History  Problem Relation Age of Onset   Diabetes Mother    Thyroid disease Mother    Colon cancer Neg Hx     Social History:  reports that she has never smoked. She has never used smokeless tobacco. She reports that she does not currently use alcohol. She reports that she does not currently use drugs.  ROS: All other review of systems were reviewed and are negative except what  is noted above in HPI  Physical Exam: BP 126/70   Pulse 83   Constitutional:  Alert and oriented, No acute distress. HEENT: [place] AT, moist mucus membranes.  Trachea midline, no masses. Cardiovascular: No clubbing, cyanosis, or edema. Respiratory: Normal respiratory effort, no increased work of breathing. GI: Abdomen is soft, nontender, nondistended, no abdominal masses GU: No CVA tenderness.  Lymph: No cervical or inguinal lymphadenopathy. Skin: No rashes, bruises or suspicious lesions. Neurologic: Grossly intact, no focal deficits, moving all 4 extremities. Psychiatric: Normal mood and affect.  Laboratory Data: Lab Results  Component Value Date   WBC 7.4 07/25/2022   HGB 12.7 07/25/2022   HCT 38.9 07/25/2022   MCV 84 07/25/2022   PLT 330 07/25/2022    Lab Results  Component Value Date   CREATININE 1.58 (H) 07/25/2022    No results found for: "PSA"  No results found for: "TESTOSTERONE"  Lab Results  Component Value Date   HGBA1C 8.3 (A) 04/20/2022    Urinalysis    Component Value Date/Time   COLORURINE YELLOW 03/16/2022 1742   APPEARANCEUR Clear 04/12/2022 1137   LABSPEC 1.012 03/16/2022 1742   PHURINE 6.0 03/16/2022 1742   GLUCOSEU Negative 04/12/2022 1137   HGBUR MODERATE (A) 03/16/2022 1742   BILIRUBINUR Negative 04/12/2022 1137   KETONESUR NEGATIVE 03/16/2022 1742   PROTEINUR 2+ (A) 04/12/2022 1137   PROTEINUR >=300 (A) 03/16/2022 1742   UROBILINOGEN  0.2 03/13/2022 1322   UROBILINOGEN 0.2 01/01/2007 1207   NITRITE Negative 04/12/2022 1137   NITRITE NEGATIVE 03/16/2022 1742   LEUKOCYTESUR Trace (A) 04/12/2022 1137   LEUKOCYTESUR NEGATIVE 03/16/2022 1742    Lab Results  Component Value Date   LABMICR See below: 04/12/2022   WBCUA 0-5 04/12/2022   LABEPIT 0-10 04/12/2022   MUCUS Present 12/26/2021   BACTERIA Few 04/12/2022    Pertinent Imaging:  Results for orders placed during the hospital encounter of 03/15/21  DG Abd 1 View  Narrative CLINICAL DATA:  Constipation, wheezing.  EXAM: ABDOMEN - 1 VIEW  COMPARISON:  Prior abdominal imaging from 2021. No recent comparison.  FINDINGS: Moderate volume of stool in the rectum. Scattered gas-filled loops of large and small bowel throughout the abdomen, mild-to-moderately distended.  Postoperative changes related to L5-S1 fusion with skin staples projecting over the spine related to spinal fusion.  IMPRESSION: Moderate volume of stool in the rectum.  Scattered gas-filled loops of large and small bowel throughout the abdomen, mild-to-moderately distended. Findings may reflect postoperative ileus, correlate with any signs of fecal impaction given the large stool ball in the area of the rectum.   Electronically Signed By: Donzetta Kohut M.D. On: 03/17/2021 16:09  No results found for this or any previous visit.  No results found for this or any previous visit.  No results found for this or any previous visit.  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  Right Kidney:  Renal measurements: 9.2 x 93.9 x 5.2 cm = volume: 97 mL. Echogenicity increased. Couple cystic with the largest being parapelvic and measuring up to 9.1 x 2.8 x 5.1 cm. No  mass or hydronephrosis visualized.  Left Kidney:  Renal measurements: 9.7 x 4.6 x 4.6 cm = volume: 107 mL. Echogenicity increased. There is a simple renal cystic lesion measuring 4.4 x  3.8 x 4.4 cm. No solid mass. Mild hydronephrosis visualized.  Urinary bladder:  Appears normal for degree of bladder distention. Prevoid volume of 78 mL. Postvoid volume of 24 mL.  Other:  None.  IMPRESSION: 1. Markedly limited evaluation. 2. Mild left hydronephrosis. 3. Bilateral simple appearing cystic lesions of the kidneys. 4. Increased renal echogenicity suggestive renal parenchymal disease.   Electronically Signed By: Tish Frederickson M.D. On: 08/15/2021 17:27  No valid procedures specified. No results found for this or any previous visit.  No results found for this or any previous visit.   Assessment & Plan:    1. History of UTI -Continue observation - Urinalysis, Routine w reflex microscopic - BLADDER SCAN AMB NON-IMAGING  2. Urge incontinence We will continue mirabegron 50mg . The patient is instructed to contact Dr. Lucio Edward office to discuss alternative to farxiga since this medication is exacerbating her urinary isssues.    No follow-ups on file.  Wilkie Aye, MD  [facility] [place]

## 2022-12-01 NOTE — Patient Instructions (Signed)

## 2022-12-06 DIAGNOSIS — M6281 Muscle weakness (generalized): Secondary | ICD-10-CM | POA: Diagnosis not present

## 2022-12-06 DIAGNOSIS — M5416 Radiculopathy, lumbar region: Secondary | ICD-10-CM | POA: Diagnosis not present

## 2022-12-06 DIAGNOSIS — R2689 Other abnormalities of gait and mobility: Secondary | ICD-10-CM | POA: Diagnosis not present

## 2022-12-06 DIAGNOSIS — M79661 Pain in right lower leg: Secondary | ICD-10-CM | POA: Insufficient documentation

## 2022-12-06 DIAGNOSIS — R6889 Other general symptoms and signs: Secondary | ICD-10-CM | POA: Diagnosis not present

## 2022-12-06 DIAGNOSIS — Z79899 Other long term (current) drug therapy: Secondary | ICD-10-CM | POA: Diagnosis not present

## 2022-12-06 DIAGNOSIS — R32 Unspecified urinary incontinence: Secondary | ICD-10-CM | POA: Diagnosis not present

## 2022-12-06 DIAGNOSIS — R531 Weakness: Secondary | ICD-10-CM | POA: Diagnosis not present

## 2022-12-06 DIAGNOSIS — R2681 Unsteadiness on feet: Secondary | ICD-10-CM | POA: Insufficient documentation

## 2022-12-08 DIAGNOSIS — N189 Chronic kidney disease, unspecified: Secondary | ICD-10-CM | POA: Diagnosis not present

## 2022-12-08 DIAGNOSIS — N1832 Chronic kidney disease, stage 3b: Secondary | ICD-10-CM | POA: Diagnosis not present

## 2022-12-08 DIAGNOSIS — D509 Iron deficiency anemia, unspecified: Secondary | ICD-10-CM | POA: Diagnosis not present

## 2022-12-08 DIAGNOSIS — R809 Proteinuria, unspecified: Secondary | ICD-10-CM | POA: Diagnosis not present

## 2022-12-08 DIAGNOSIS — D631 Anemia in chronic kidney disease: Secondary | ICD-10-CM | POA: Diagnosis not present

## 2022-12-08 DIAGNOSIS — Z79899 Other long term (current) drug therapy: Secondary | ICD-10-CM | POA: Diagnosis not present

## 2022-12-12 DIAGNOSIS — R6889 Other general symptoms and signs: Secondary | ICD-10-CM | POA: Diagnosis not present

## 2022-12-12 DIAGNOSIS — E1122 Type 2 diabetes mellitus with diabetic chronic kidney disease: Secondary | ICD-10-CM | POA: Diagnosis not present

## 2022-12-12 DIAGNOSIS — R3914 Feeling of incomplete bladder emptying: Secondary | ICD-10-CM | POA: Diagnosis not present

## 2022-12-12 DIAGNOSIS — E1129 Type 2 diabetes mellitus with other diabetic kidney complication: Secondary | ICD-10-CM | POA: Diagnosis not present

## 2022-12-12 DIAGNOSIS — R809 Proteinuria, unspecified: Secondary | ICD-10-CM | POA: Diagnosis not present

## 2022-12-13 DIAGNOSIS — M6281 Muscle weakness (generalized): Secondary | ICD-10-CM | POA: Diagnosis not present

## 2022-12-13 DIAGNOSIS — R2689 Other abnormalities of gait and mobility: Secondary | ICD-10-CM | POA: Diagnosis not present

## 2022-12-13 DIAGNOSIS — R6889 Other general symptoms and signs: Secondary | ICD-10-CM | POA: Diagnosis not present

## 2022-12-16 ENCOUNTER — Other Ambulatory Visit: Payer: Self-pay | Admitting: Gastroenterology

## 2022-12-20 DIAGNOSIS — R6889 Other general symptoms and signs: Secondary | ICD-10-CM | POA: Diagnosis not present

## 2022-12-20 DIAGNOSIS — R2689 Other abnormalities of gait and mobility: Secondary | ICD-10-CM | POA: Diagnosis not present

## 2022-12-20 DIAGNOSIS — M6281 Muscle weakness (generalized): Secondary | ICD-10-CM | POA: Diagnosis not present

## 2022-12-22 DIAGNOSIS — M6281 Muscle weakness (generalized): Secondary | ICD-10-CM | POA: Diagnosis not present

## 2022-12-22 DIAGNOSIS — R6889 Other general symptoms and signs: Secondary | ICD-10-CM | POA: Diagnosis not present

## 2022-12-22 DIAGNOSIS — R2689 Other abnormalities of gait and mobility: Secondary | ICD-10-CM | POA: Diagnosis not present

## 2022-12-27 DIAGNOSIS — M6281 Muscle weakness (generalized): Secondary | ICD-10-CM | POA: Diagnosis not present

## 2022-12-27 DIAGNOSIS — R2689 Other abnormalities of gait and mobility: Secondary | ICD-10-CM | POA: Diagnosis not present

## 2022-12-27 DIAGNOSIS — R6889 Other general symptoms and signs: Secondary | ICD-10-CM | POA: Diagnosis not present

## 2022-12-29 DIAGNOSIS — R2689 Other abnormalities of gait and mobility: Secondary | ICD-10-CM | POA: Diagnosis not present

## 2022-12-29 DIAGNOSIS — R6889 Other general symptoms and signs: Secondary | ICD-10-CM | POA: Diagnosis not present

## 2022-12-29 DIAGNOSIS — M6281 Muscle weakness (generalized): Secondary | ICD-10-CM | POA: Diagnosis not present

## 2023-01-03 DIAGNOSIS — R6889 Other general symptoms and signs: Secondary | ICD-10-CM | POA: Diagnosis not present

## 2023-01-03 DIAGNOSIS — M6281 Muscle weakness (generalized): Secondary | ICD-10-CM | POA: Diagnosis not present

## 2023-01-03 DIAGNOSIS — R2689 Other abnormalities of gait and mobility: Secondary | ICD-10-CM | POA: Diagnosis not present

## 2023-01-10 DIAGNOSIS — M6281 Muscle weakness (generalized): Secondary | ICD-10-CM | POA: Diagnosis not present

## 2023-01-10 DIAGNOSIS — R6889 Other general symptoms and signs: Secondary | ICD-10-CM | POA: Diagnosis not present

## 2023-01-10 DIAGNOSIS — R2689 Other abnormalities of gait and mobility: Secondary | ICD-10-CM | POA: Diagnosis not present

## 2023-01-12 ENCOUNTER — Other Ambulatory Visit: Payer: Self-pay | Admitting: *Deleted

## 2023-01-12 DIAGNOSIS — R2689 Other abnormalities of gait and mobility: Secondary | ICD-10-CM | POA: Diagnosis not present

## 2023-01-12 DIAGNOSIS — M6281 Muscle weakness (generalized): Secondary | ICD-10-CM | POA: Diagnosis not present

## 2023-01-12 DIAGNOSIS — R6889 Other general symptoms and signs: Secondary | ICD-10-CM | POA: Diagnosis not present

## 2023-01-12 DIAGNOSIS — N1832 Chronic kidney disease, stage 3b: Secondary | ICD-10-CM

## 2023-01-12 MED ORDER — FREESTYLE LIBRE 3 READER DEVI: 0 refills | Status: DC

## 2023-01-12 MED ORDER — FREESTYLE LIBRE 3 SENSOR MISC: 1.0000 | 2 refills | Status: DC

## 2023-01-15 ENCOUNTER — Encounter: Payer: Self-pay | Admitting: Nurse Practitioner

## 2023-01-15 ENCOUNTER — Ambulatory Visit (INDEPENDENT_AMBULATORY_CARE_PROVIDER_SITE_OTHER): Payer: Medicare HMO | Admitting: Nurse Practitioner

## 2023-01-15 VITALS — BP 132/76 | HR 80 | Ht 63.0 in | Wt 169.8 lb

## 2023-01-15 DIAGNOSIS — N1832 Chronic kidney disease, stage 3b: Secondary | ICD-10-CM | POA: Diagnosis not present

## 2023-01-15 DIAGNOSIS — Z7984 Long term (current) use of oral hypoglycemic drugs: Secondary | ICD-10-CM

## 2023-01-15 DIAGNOSIS — R6889 Other general symptoms and signs: Secondary | ICD-10-CM | POA: Diagnosis not present

## 2023-01-15 DIAGNOSIS — E1122 Type 2 diabetes mellitus with diabetic chronic kidney disease: Secondary | ICD-10-CM

## 2023-01-15 DIAGNOSIS — Z7985 Long-term (current) use of injectable non-insulin antidiabetic drugs: Secondary | ICD-10-CM | POA: Diagnosis not present

## 2023-01-15 DIAGNOSIS — Z794 Long term (current) use of insulin: Secondary | ICD-10-CM | POA: Diagnosis not present

## 2023-01-15 DIAGNOSIS — E782 Mixed hyperlipidemia: Secondary | ICD-10-CM

## 2023-01-15 DIAGNOSIS — I10 Essential (primary) hypertension: Secondary | ICD-10-CM

## 2023-01-15 LAB — POCT GLYCOSYLATED HEMOGLOBIN (HGB A1C): Hemoglobin A1C: 7.7 % — AB (ref 4.0–5.6)

## 2023-01-15 MED ORDER — FREESTYLE LIBRE 3 SENSOR MISC: 1.0000 | 2 refills | Status: DC

## 2023-01-15 MED ORDER — FREESTYLE LIBRE 3 READER DEVI: 0 refills | Status: DC

## 2023-01-15 NOTE — Progress Notes (Signed)
01/15/2023, 3:08 PM  Endocrinology follow-up note   Subjective:    Patient ID: Stephanie Moreno, female    DOB: [birth year].  Stephanie Moreno is being seen in follow-up after she was seen in consultation for management of currently uncontrolled symptomatic diabetes requested by  Benita Stabile, MD.   Past Medical History:  Diagnosis Date  . Acid reflux disease   . Anemia   . Anxiety   . Asthma   . Breast cancer (HCC)    left side  . Chronic back pain   . Chronic kidney disease   . Depression   . Diabetes mellitus without complication (HCC)   . Diverticulitis   . Hypertension   . Lumbosacral spondylosis    Per previous PCP records  . Sleep apnea   . Thyroid disease     Past Surgical History:  Procedure Laterality Date  . ABDOMINAL HYSTERECTOMY    . BACK SURGERY    . BALLOON DILATION N/A 04/24/2022   Procedure: BALLOON DILATION;  Surgeon: Lanelle Bal, DO;  [facility]: [facility];  Service: Endoscopy;  Laterality: N/A;  . BIOPSY  02/19/2020   Procedure: BIOPSY;  Surgeon: Lanelle Bal, DO;  Location: [facility];  Service: Endoscopy;;  . BIOPSY  04/24/2022   Procedure: BIOPSY;  Surgeon: Lanelle Bal, DO;  Location: [facility];  Service: Endoscopy;;  . BREAST LUMPECTOMY Left 2003  . BREAST SURGERY    . COLONOSCOPY WITH PROPOFOL N/A 02/19/2020   non-bleeding internal hemorrhoids, many small-mouthed diverticula in entire colon.  . ESOPHAGOGASTRODUODENOSCOPY (EGD) WITH PROPOFOL N/A 02/19/2020    benign-appearing esophageal stenosis s/p dilation, gastritis s/p biopsy, normal duodenum. Negative H.pylori, negative duodenal biopsy.   . ESOPHAGOGASTRODUODENOSCOPY (EGD) WITH PROPOFOL N/A 04/24/2022   small hiatal hernia, mild Schatzki ring, s/p dilation. Gastritis s/p biopsy. Negative H.pylori.  Marland Kitchen GIVENS CAPSULE STUDY N/A 06/14/2020   Procedure: GIVENS CAPSULE STUDY;  Surgeon: Lanelle Bal, DO;  Location: [facility];  Service:  Endoscopy;  Laterality: N/A;  7:30am  . THYROIDECTOMY    . tummy tuck     at time of breast surgery 2003    Social History   Socioeconomic History  . Marital status: Divorced    Spouse name: Not on file  . Number of children: Not on file  . Years of education: Not on file  . Highest education level: Not on file  Occupational History  . 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 Smoking status: Never  . Smokeless tobacco: Never  Vaping Use  . Vaping status: Never Used  Substance and Sexual Activity  . Alcohol use: Not Currently  . Drug use: Not Currently  . Sexual activity: Not on file  Other Topics Concern  . Not on file  Social History Narrative  . Not on file   Social Determinants of Health   Financial Resource Strain: Not on file  Food Insecurity: Not on file  Transportation Needs: Not on file  Physical Activity: Not on file  Stress: Not on file  Social Connections: Not on file    Family History  Problem Relation Age of Onset  . Diabetes Mother   . Thyroid disease Mother   . Colon cancer Neg Hx     Outpatient Encounter Medications as of 01/15/2023  Medication Sig  . acetaminophen (TYLENOL) 325 MG tablet Take 650 mg by  mouth every 4 (four) hours as needed.  Marland Kitchen amLODipine (NORVASC) 10 MG tablet Take 10 mg by mouth daily.  . Azelastine HCl 0.15 % SOLN Place 1 spray into both nostrils daily.  . Continuous Blood Gluc Sensor (FREESTYLE LIBRE 2 SENSOR) MISC 2 each by Does not apply route every 14 (fourteen) days.  . Continuous Glucose Receiver (FREESTYLE LIBRE 2 READER) DEVI USE TO CHECK GLUCOSE AS DIRECTED  . fluticasone (FLONASE) 50 MCG/ACT nasal spray Place 2 sprays into both nostrils daily.  . fluticasone (FLOVENT HFA) 220 MCG/ACT inhaler Inhale 1 puff into the lungs 2 (two) times daily.  . Fluticasone Furoate (ARNUITY ELLIPTA) 200 MCG/ACT AEPB Inhale 1 puff into the lungs daily.  . furosemide (LASIX) 20 MG tablet Take 1  tablet (20 mg total) by mouth every other day. Takes 40 mg every other day. On alternate days take 80 mg (Patient taking differently: Take 20 mg by mouth daily.)  . hydrALAZINE (APRESOLINE) 50 MG tablet Take 50 mg by mouth 2 (two) times daily.  Marland Kitchen HYDROcodone-acetaminophen (NORCO) 5-325 MG tablet Take 1 tablet by mouth 2 (two) times daily as needed.  . insulin degludec (TRESIBA FLEXTOUCH) 100 UNIT/ML FlexTouch Pen Inject 50 Units into the skin at bedtime.  . Insulin Pen Needle 31G X 6 MM MISC 1 each by Does not apply route daily.  Marland Kitchen lidocaine (LIDODERM) 5 % Place 1 patch onto the skin daily.  Marland Kitchen losartan (COZAAR) 25 MG tablet Take 25 mg by mouth daily.  . mirabegron ER (MYRBETRIQ) 50 MG TB24 tablet TAKE 1 TABLET BY MOUTH DAILY AT  9 AM  . ondansetron (ZOFRAN) 4 MG tablet TAKE 1 TABLET BY MOUTH EVERY 8 HOURS AS NEEDED FOR NAUSEA AND VOMITING  . OVER THE COUNTER MEDICATION Prevagine one per day for memory.  . pantoprazole (PROTONIX) 40 MG tablet TAKE 1 TABLET (40 MG TOTAL) BY MOUTH TWICE A DAY BEFORE MEALS  . polyethylene glycol (MIRALAX / GLYCOLAX) 17 g packet Take 17 g by mouth daily.  . polyethylene glycol powder (GLYCOLAX/MIRALAX) 17 GM/SCOOP powder Take 1 capful daily as needed for constipation.  . sucralfate (CARAFATE) 1 g tablet TAKE 1 TABLET IN THE MORNING, AT NOON, AND AT BEDTIME. MAY CRUSH AND MIX WITH WATER IF NEEDED.  Marland Kitchen TRULICITY 3 MG/0.5ML SOPN Inject 3 mg into the skin once a week.  . Continuous Glucose Receiver (FREESTYLE LIBRE 3 READER) DEVI Use the Reader to monitor your blood sugar readings daily  . Continuous Glucose Sensor (FREESTYLE LIBRE 3 SENSOR) MISC 1 Application by Does not apply route every 14 (fourteen) days.  Marland Kitchen docusate sodium (COLACE) 100 MG capsule Take 1 capsule (100 mg total) by mouth 2 (two) times daily.  . [DISCONTINUED] Continuous Glucose Receiver (FREESTYLE LIBRE 3 READER) DEVI Use the Reader to monitor your blood sugar readings daily (Patient not taking: Reported  on 01/15/2023)  . [DISCONTINUED] Continuous Glucose Sensor (FREESTYLE LIBRE 3 SENSOR) MISC 1 Application by Does not apply route every 14 (fourteen) days. (Patient not taking: Reported on 01/15/2023)  . [DISCONTINUED] FARXIGA 5 MG TABS tablet Take 5 mg by mouth. Take 1 by mouth on Monday, Wednesday and Friday (Patient not taking: Reported on 01/15/2023)  . [DISCONTINUED] glipiZIDE (GLUCOTROL XL) 5 MG 24 hr tablet Take 5 mg by mouth every morning. (Patient not taking: Reported on 01/15/2023)   No facility-administered encounter medications on file as of 01/15/2023.    ALLERGIES: Allergies  Allergen Reactions  . Codeine Nausea And Vomiting and Anaphylaxis  .  Carvedilol     Cold sweats, diarrhea, severe hot flashes  . Hydrochlorothiazide Other (See Comments)    Other reaction(s): stomach upset  . Lisinopril Nausea And Vomiting    Other reaction(s): cough  . Oxybutynin Other (See Comments)  . Penicillins Hives    VACCINATION STATUS: Immunization History  Administered Date(s) Administered  . Fluad Quad(high Dose 65+) 03/17/2021  . Pneumococcal Conjugate-13 09/02/2014  . Pneumococcal Polysaccharide-23 07/03/2010  . Tdap 07/03/2000    Diabetes She presents for her follow-up diabetic visit. She has type 2 diabetes mellitus. Onset time: Diagnosed at approx age of 37. Her disease course has been improving. There are no hypoglycemic associated symptoms. Associated symptoms include fatigue and foot paresthesias. Pertinent negatives for diabetes include no polydipsia, no polyuria and no weight loss. There are no hypoglycemic complications. Symptoms are stable. Diabetic complications include a CVA, nephropathy and peripheral neuropathy. Risk factors for coronary artery disease include diabetes mellitus, dyslipidemia, hypertension, post-menopausal and sedentary lifestyle. Current diabetic treatment includes insulin injections (and Trulicity). She is compliant with treatment most of the time. Her weight  is fluctuating minimally. She is following a generally healthy diet. When asked about meal planning, she reported none. She has not had a previous visit with a dietitian. She rarely participates in exercise. Her home blood glucose trend is decreasing steadily. Her overall blood glucose range is 140-180 mg/dl. (She presents today with her CGM showing at goal glycemic profile overall.  Her POCT A1c today is 7.7%, improving from last visit of 8.1%.  Analysis of her CGM shows TIR 73%, TAR 27%, TBR 0% with a GMI of 7%.  She denies any hypoglycemia.  She was taken off Glipizide for hypoglycemia and off Farxiga due to incontinence issues.) An ACE inhibitor/angiotensin II receptor blocker is being taken. She does not see a podiatrist.Eye exam is current.  Hypertension This is a chronic problem. The current episode started more than 1 year ago. The problem is unchanged. The problem is controlled. Associated symptoms include peripheral edema. Agents associated with hypertension include thyroid hormones. Risk factors for coronary artery disease include diabetes mellitus, dyslipidemia, post-menopausal state and sedentary lifestyle. Past treatments include diuretics, beta blockers, ACE inhibitors and angiotensin blockers. There are no compliance problems.  Hypertensive end-organ damage includes kidney disease and CVA. Identifiable causes of hypertension include chronic renal disease, sleep apnea and a thyroid problem.  Hyperlipidemia This is a chronic problem. The current episode started more than 1 year ago. The problem is controlled. Recent lipid tests were reviewed and are normal. Exacerbating diseases include chronic renal disease and hypothyroidism. Factors aggravating her hyperlipidemia include beta blockers. Current antihyperlipidemic treatment includes statins. The current treatment provides moderate improvement of lipids. There are no compliance problems.  Risk factors for coronary artery disease include diabetes  mellitus, dyslipidemia, hypertension, obesity, post-menopausal and a sedentary lifestyle.     Review of systems  Constitutional: + minimally fluctuating body weight,  current Body mass index is 30.08 kg/m. , + fatigue, no subjective hyperthermia, no subjective hypothermia Eyes: no blurry vision, no xerophthalmia ENT: no sore throat, no nodules palpated in throat, no dysphagia/odynophagia, no hoarseness Cardiovascular: no chest pain, no shortness of breath, no palpitations Respiratory: no cough, no shortness of breath Gastrointestinal: no nausea/vomiting/diarrhea Genitourinary: + polyuria, stress incontinence Musculoskeletal: ongoing back pain-will be getting injections soon, walks with walker due to previous CVA and disequilibrium- has frequent falls Skin: no rashes, no hyperemia Neurological: no tremors, no dizziness, + numbness/tingling to BLE (on gabapentin) Psychiatric: no depression,  no anxiety  Objective:     BP 132/76 (BP Location: Left Arm, Patient Position: Sitting, Cuff Size: Large)   Pulse 80   Ht 5\' 3"  (1.6 m)   Wt 169 lb 12.8 oz (77 kg)   BMI 30.08 kg/m   Wt Readings from Last 3 Encounters:  01/15/23 169 lb 12.8 oz (77 kg)  10/03/22 169 lb 12.8 oz (77 kg)  09/26/22 170 lb (77.1 kg)    BP Readings from Last 3 Encounters:  01/15/23 132/76  12/01/22 126/70  10/19/22 (!) 168/82     Physical Exam- Limited  Constitutional:  Body mass index is 30.08 kg/m. , not in acute distress, normal state of mind Eyes:  EOMI, no exophthalmos Musculoskeletal: no gross deformities,  walks with walker due to disequilibrium and frequent falls Skin:  no rashes, no hyperemia Neurological: no tremor with outstretched hands   Diabetic Foot Exam - Simple   No data filed     CMP ( most recent) CMP     Component Value Date/Time   NA 141 07/25/2022 1232   K 4.2 07/25/2022 1232   CL 105 07/25/2022 1232   CO2 17 (L) 07/25/2022 1232   GLUCOSE 102 (H) 07/25/2022 1232    GLUCOSE 185 (H) 04/24/2022 1251   BUN 16 07/25/2022 1232   CREATININE 1.58 (H) 07/25/2022 1232   CREATININE 1.42 (H) 10/12/2020 1009   CALCIUM 9.7 07/25/2022 1232   PROT 8.1 07/25/2022 1232   ALBUMIN 4.5 07/25/2022 1232   AST 22 07/25/2022 1232   ALT 19 07/25/2022 1232   ALKPHOS 102 07/25/2022 1232   BILITOT 0.3 07/25/2022 1232   GFRNONAA 35 (L) 04/19/2022 1045   GFRNONAA 36 (L) 10/12/2020 1009   GFRAA 42 (L) 10/12/2020 1009     Diabetic Labs (most recent): Lab Results  Component Value Date   HGBA1C 7.7 (A) 01/15/2023   HGBA1C 8.3 (A) 04/20/2022   HGBA1C 8.7 12/19/2021   MICROALBUR 80 10/26/2020     Lipid Panel ( most recent) Lipid Panel     Component Value Date/Time   CHOL 91 04/19/2021 0000   TRIG 78 04/19/2021 0000   HDL 42 04/19/2021 0000   CHOLHDL 2.0 10/12/2020 1009   LDLCALC 34 04/19/2021 0000   LDLCALC 46 10/12/2020 1009      Lab Results  Component Value Date   TSH 3.94 10/12/2020   TSH 1.18 03/09/2020      Assessment & Plan:   1) Type 2 diabetes mellitus with stage 3b chronic kidney disease, without long-term current use of insulin (HCC)  - Stephanie Moreno has currently uncontrolled symptomatic type 2 DM since  77 years of age.  She presents today with her CGM showing at goal glycemic profile overall.  Her POCT A1c today is 7.7%, improving from last visit of 8.1%.  Analysis of her CGM shows TIR 73%, TAR 27%, TBR 0% with a GMI of 7%.  She denies any hypoglycemia.  She was taken off Glipizide for hypoglycemia and off Farxiga due to incontinence issues.   Recent labs reviewed.    - I had a long discussion with her about the progressive nature of diabetes and the pathology behind its complications. -her diabetes is complicated by CKD, CVA and she remains at a high risk for more acute and chronic complications which include CAD, CVA, CKD, retinopathy, and neuropathy. These are all discussed in detail with her.  - Nutritional counseling repeated at each  appointment due to patients tendency to fall  back in to old habits.  - The patient admits there is a room for improvement in their diet and drink choices. -  Suggestion is made for the patient to avoid simple carbohydrates from their diet including Cakes, Sweet Desserts / Pastries, Ice Cream, Soda (diet and regular), Sweet Tea, Candies, Chips, Cookies, Sweet Pastries, Store Bought Juices, Alcohol in Excess of 1-2 drinks a day, Artificial Sweeteners, Coffee Creamer, and "Sugar-free" Products. This will help patient to have stable blood glucose profile and potentially avoid unintended weight gain.   - I encouraged the patient to switch to unprocessed or minimally processed complex starch and increased protein intake (animal or plant source), fruits, and vegetables.   - Patient is advised to stick to a routine mealtimes to eat 3 meals a day and avoid unnecessary snacks (to snack only to correct hypoglycemia).  - she will be scheduled with Norm Salt, RDN, CDE for diabetes education.  - I have approached her with the following individualized plan to manage  her diabetes and patient agrees:   - she will need at least basal insulin in order for her to achieve control of diabetes to target, and she accepts this treatment option at this time.    -Based on her stable and improving glycemic profile and lack of hypoglycemia, no changes will be made to her medication regimen today.  She is advised to continue her Tresiba 50 units SQ nightly, and continue her Trulicity 3 mg SQ weekly.  She can stay off Farxiga (and like products- due to polyuria and stress incontinence) and Glipizide (due to hypoglycemia).  -She is encouraged to continue monitoring blood glucose twice daily (using her CGM), before breakfast and before bed, and to call the clinic if she has readings less than 70 or greater than 300 for 3 tests in a row.   - Specific targets for  A1c;  LDL, HDL,  and Triglycerides were discussed with the  patient.  2) Blood Pressure /Hypertension:   Her blood pressure is controlled to target for her age.   she is advised to continue her current medications including Losartan 25 mg p.o. daily with breakfast, and Lasix 20 every other day.  Will defer changes to nephrologist.  3) Lipids/Hyperlipidemia:    Review of her recent lipid panel from 09/14/22 showed controlled LDL at 45.  she  is advised to continue Crestor 10 mg p.o. daily at bedtime.   Side effects and precautions discussed with her.  4)  Weight/Diet:  Her Body mass index is 30.08 kg/m.  -   clearly complicating her diabetes care.   she is  a candidate for weight loss. I discussed with her the fact that loss of 5 - 10% of her  current body weight will have the most impact on her diabetes management.  Exercise, and detailed carbohydrates information provided  -  detailed on discharge instructions.  5) Chronic Care/Health Maintenance: -she  is on ACEI/ARB and Statin medications and  is encouraged to initiate and continue to follow up with Ophthalmology, Dentist,  Podiatrist at least yearly or according to recommendations, and advised to  stay away from smoking. I have recommended yearly flu vaccine and pneumonia vaccine at least every 5 years; she cannot exercise optimally due to her disequilibrium.  She is advised to sleep for at least 7 hours a day.  - she is  advised to maintain close follow up with Benita Stabile, MD for primary care needs, as well as her other providers  for optimal and coordinated care.     I spent  43  minutes in the care of the patient today including review of labs from CMP, Lipids, Thyroid Function, Hematology (current and previous including abstractions from other facilities); face-to-face time discussing  her blood glucose readings/logs, discussing hypoglycemia and hyperglycemia episodes and symptoms, medications doses, her options of short and long term treatment based on the latest standards of care / guidelines;   discussion about incorporating lifestyle medicine;  and documenting the encounter. Risk reduction counseling performed per USPSTF guidelines to reduce obesity and cardiovascular risk factors.     Please refer to Patient Instructions for Blood Glucose Monitoring and Insulin/Medications Dosing Guide"  in media tab for additional information. Please  also refer to " Patient Self Inventory" in the Media  tab for reviewed elements of pertinent patient history.  Stephanie Moreno participated in the discussions, expressed understanding, and voiced agreement with the above plans.  All questions were answered to her satisfaction. she is encouraged to contact clinic should she have any questions or concerns prior to her return visit.   Follow up plan: - Return in about 3 months (around 04/17/2023) for Diabetes F/U with A1c in office, Bring meter and logs, No previsit labs.  Ronny Bacon, [facility] [address] [place], [place] [address] Phone: [phone] Fax: [phone]  01/15/2023, 3:08 PM

## 2023-01-16 ENCOUNTER — Telehealth: Payer: Self-pay | Admitting: *Deleted

## 2023-01-16 DIAGNOSIS — E1122 Type 2 diabetes mellitus with diabetic chronic kidney disease: Secondary | ICD-10-CM

## 2023-01-16 MED ORDER — FREESTYLE LIBRE 3 READER DEVI: 0 refills | Status: AC

## 2023-01-16 MED ORDER — FREESTYLE LIBRE 3 SENSOR MISC: 1.0000 | 2 refills | Status: DC

## 2023-01-16 NOTE — Telephone Encounter (Signed)
Let me try sending it to [facility] and see if it is cheaper through them since she has medicare.  She should be getting a telephone call from them wanting to verify address and insurance info.  You could also give her their number to call and check on the script (number comes up as long distance number which most people dont answer in case it is spam).

## 2023-01-16 NOTE — Telephone Encounter (Signed)
Stephanie Moreno is leaving a message for [employer]. For her to get the [place] 3 and sensors at [facility] it will cost her $40. She is having to borrow the money to get this. She is asking if there is anywhere else that she may be able to get it cheaper?

## 2023-01-16 NOTE — Telephone Encounter (Signed)
Patient was called and made aware. She shares that she thought that she was going to be able to use [facility] but she will not be able too. I gave her the number for [facility] and suggested that she may want to answer any 800 numbers or questionable numbers as they may have other numbers that they may call her from. She will keep [place] posted.

## 2023-01-17 DIAGNOSIS — M6281 Muscle weakness (generalized): Secondary | ICD-10-CM | POA: Diagnosis not present

## 2023-01-17 DIAGNOSIS — R6889 Other general symptoms and signs: Secondary | ICD-10-CM | POA: Diagnosis not present

## 2023-01-17 DIAGNOSIS — R2689 Other abnormalities of gait and mobility: Secondary | ICD-10-CM | POA: Diagnosis not present

## 2023-01-19 DIAGNOSIS — R2689 Other abnormalities of gait and mobility: Secondary | ICD-10-CM | POA: Diagnosis not present

## 2023-01-19 DIAGNOSIS — R6889 Other general symptoms and signs: Secondary | ICD-10-CM | POA: Diagnosis not present

## 2023-01-19 DIAGNOSIS — M6281 Muscle weakness (generalized): Secondary | ICD-10-CM | POA: Diagnosis not present

## 2023-01-24 DIAGNOSIS — R6889 Other general symptoms and signs: Secondary | ICD-10-CM | POA: Diagnosis not present

## 2023-01-24 DIAGNOSIS — R2689 Other abnormalities of gait and mobility: Secondary | ICD-10-CM | POA: Diagnosis not present

## 2023-01-24 DIAGNOSIS — M6281 Muscle weakness (generalized): Secondary | ICD-10-CM | POA: Diagnosis not present

## 2023-01-25 ENCOUNTER — Other Ambulatory Visit: Payer: Self-pay | Admitting: Urology

## 2023-01-25 DIAGNOSIS — N3941 Urge incontinence: Secondary | ICD-10-CM

## 2023-01-26 DIAGNOSIS — R6889 Other general symptoms and signs: Secondary | ICD-10-CM | POA: Diagnosis not present

## 2023-01-26 DIAGNOSIS — M6281 Muscle weakness (generalized): Secondary | ICD-10-CM | POA: Diagnosis not present

## 2023-01-26 DIAGNOSIS — R2689 Other abnormalities of gait and mobility: Secondary | ICD-10-CM | POA: Diagnosis not present

## 2023-01-26 DIAGNOSIS — E1165 Type 2 diabetes mellitus with hyperglycemia: Secondary | ICD-10-CM | POA: Diagnosis not present

## 2023-01-29 ENCOUNTER — Emergency Department (HOSPITAL_COMMUNITY)
Admission: EM | Admit: 2023-01-29 | Discharge: 2023-01-29 | Disposition: A | Discharge status: Home / Self Care | Payer: Medicare HMO | Attending: Emergency Medicine | Admitting: Emergency Medicine

## 2023-01-29 ENCOUNTER — Emergency Department (HOSPITAL_COMMUNITY): Payer: Medicare HMO

## 2023-01-29 ENCOUNTER — Other Ambulatory Visit: Payer: Self-pay

## 2023-01-29 DIAGNOSIS — G8911 Acute pain due to trauma: Secondary | ICD-10-CM | POA: Diagnosis not present

## 2023-01-29 DIAGNOSIS — M25561 Pain in right knee: Secondary | ICD-10-CM | POA: Diagnosis not present

## 2023-01-29 DIAGNOSIS — W19XXXA Unspecified fall, initial encounter: Secondary | ICD-10-CM | POA: Diagnosis not present

## 2023-01-29 DIAGNOSIS — R269 Unspecified abnormalities of gait and mobility: Secondary | ICD-10-CM | POA: Diagnosis not present

## 2023-01-29 NOTE — ED Notes (Signed)
RN assisted pt to bathroom and out to the parking lot in a wheelchair. As well as managed her walker. Pt was alert and oriented. Pt expressed confidence to manage self from the parking lot.

## 2023-01-29 NOTE — Discharge Instructions (Signed)
You were seen in the emergency department for evaluation of right knee pain.  You had x-rays that did not show any obvious fracture or dislocation.  Please continue to ice it and use Tylenol as needed for pain.  Follow-up with your primary care doctor and orthopedics.  Return if any worsening or concerning symptoms.

## 2023-01-29 NOTE — ED Triage Notes (Signed)
Pt fell last week on 7/24 and injured knee. Pt went to Physical Therapy on Friday 01/26/23 and was told to go to ED, so pt came today.

## 2023-01-29 NOTE — ED Provider Notes (Signed)
[name] Provider Note   CSN: [id number] Arrival date & time: 01/29/23  4742     History  Chief Complaint  Patient presents with   Knee Pain    Stephanie Moreno is a 77 y.o. female.  She is here with a complaint of right knee pain.  She said her knee always gives her a little bit of problem but she fell last week and it has been hurting her more.  She went to physical therapy and they told her she needed to get some x-rays.  She has an appointment with Dr. Romeo Apple in a few weeks but the pain was getting worse so she wanted to get checked out.  She denies any other injuries or complaints.  The history is provided by the patient.  Knee Pain Location:  Knee Injury: yes   Mechanism of injury: fall   Fall:    Fall occurred:  Walking Knee location:  R knee Pain details:    Quality:  Throbbing   Severity:  Moderate   Timing:  Constant   Progression:  Unchanged Chronicity:  New Associated symptoms: stiffness and swelling   Associated symptoms: no fever, no muscle weakness and no numbness        Home Medications Prior to Admission medications   Medication Sig Start Date End Date Taking? Authorizing Provider  acetaminophen (TYLENOL) 325 MG tablet Take 650 mg by mouth every 4 (four) hours as needed.    [provider]  amLODipine (NORVASC) 10 MG tablet Take 10 mg by mouth daily.    [provider]  Azelastine HCl 0.15 % SOLN Place 1 spray into both nostrils daily. 08/19/21   [provider]  Continuous Blood Gluc Sensor (FREESTYLE LIBRE 2 SENSOR) MISC 2 each by Does not apply route every 14 (fourteen) days. 08/04/22   Dani Gobble, NP  Continuous Glucose Receiver (FREESTYLE LIBRE 2 READER) DEVI USE TO CHECK GLUCOSE AS DIRECTED 11/01/22   Roma Kayser, MD  Continuous Glucose Receiver (FREESTYLE LIBRE 3 READER) DEVI Use the Reader to monitor your blood sugar readings daily 01/16/23   Dani Gobble,  NP  Continuous Glucose Sensor (FREESTYLE LIBRE 3 SENSOR) MISC 1 Application by Does not apply route every 14 (fourteen) days. 01/16/23   Dani Gobble, NP  docusate sodium (COLACE) 100 MG capsule Take 1 capsule (100 mg total) by mouth 2 (two) times daily. 01/04/22   Kerrin Champagne, MD  fluticasone (FLONASE) 50 MCG/ACT nasal spray Place 2 sprays into both nostrils daily.    [provider]  fluticasone (FLOVENT HFA) 220 MCG/ACT inhaler Inhale 1 puff into the lungs 2 (two) times daily. 07/24/22   [provider]  Fluticasone Furoate (ARNUITY ELLIPTA) 200 MCG/ACT AEPB Inhale 1 puff into the lungs daily.    [provider]  furosemide (LASIX) 20 MG tablet Take 1 tablet (20 mg total) by mouth every other day. Takes 40 mg every other day. On alternate days take 80 mg Patient taking differently: Take 20 mg by mouth daily. 04/18/21   Johnson, Clanford L, MD  hydrALAZINE (APRESOLINE) 50 MG tablet Take 50 mg by mouth 2 (two) times daily.    [provider]  HYDROcodone-acetaminophen (NORCO) 5-325 MG tablet Take 1 tablet by mouth 2 (two) times daily as needed. 07/18/22   Tarry Kos, MD  insulin degludec (TRESIBA FLEXTOUCH) 100 UNIT/ML FlexTouch Pen Inject 50 Units into the skin at bedtime. 10/03/22  Dani Gobble, NP  Insulin Pen Needle 31G X 6 MM MISC 1 each by Does not apply route daily. 08/04/22   Dani Gobble, NP  lidocaine (LIDODERM) 5 % Place 1 patch onto the skin daily.    [provider]  losartan (COZAAR) 25 MG tablet Take 25 mg by mouth daily.    [provider]  mirabegron ER (MYRBETRIQ) 50 MG TB24 tablet TAKE 1 TABLET BY MOUTH DAILY AT  9 AM 12/01/22   McKenzie, Mardene Celeste, MD  ondansetron (ZOFRAN) 4 MG tablet TAKE 1 TABLET BY MOUTH EVERY 8 HOURS AS NEEDED FOR NAUSEA AND VOMITING 11/28/22   Gelene Mink, NP  OVER THE COUNTER MEDICATION Prevagine one per day for memory.    [provider]  pantoprazole (PROTONIX) 40 MG tablet  TAKE 1 TABLET (40 MG TOTAL) BY MOUTH TWICE A DAY BEFORE MEALS 12/18/22   Gelene Mink, NP  polyethylene glycol (MIRALAX / GLYCOLAX) 17 g packet Take 17 g by mouth daily. 05/17/22   Gelene Mink, NP  polyethylene glycol powder (GLYCOLAX/MIRALAX) 17 GM/SCOOP powder Take 1 capful daily as needed for constipation. 08/23/22   Gelene Mink, NP  sucralfate (CARAFATE) 1 g tablet TAKE 1 TABLET IN THE MORNING, AT NOON, AND AT BEDTIME. MAY CRUSH AND MIX WITH WATER IF NEEDED. 11/06/22   Gelene Mink, NP  TRULICITY 3 MG/0.5ML SOPN Inject 3 mg into the skin once a week. 10/03/22   Dani Gobble, NP      Allergies    Codeine, Carvedilol, Hydrochlorothiazide, Lisinopril, Oxybutynin, and Penicillins    Review of Systems   Review of Systems  Constitutional:  Negative for fever.  Respiratory:  Negative for shortness of breath.   Cardiovascular:  Negative for chest pain.  Musculoskeletal:  Positive for stiffness.  Neurological:  Negative for numbness.    Physical Exam Updated Vital Signs BP (!) 161/81 (BP Location: Left Arm)   Pulse 79   Temp 98.7 F (37.1 C) (Oral)   Resp 17   Ht 5\' 3"  (1.6 m)   Wt 77.1 kg   SpO2 100%   BMI 30.11 kg/m  Physical Exam Constitutional:      Appearance: Normal appearance. She is well-developed.  HENT:     Head: Normocephalic and atraumatic.  Eyes:     Conjunctiva/sclera: Conjunctivae normal.  Musculoskeletal:        General: Swelling and tenderness present. No deformity. Normal range of motion.     Cervical back: Neck supple.     Comments: Right knee she has probably a mild effusion.  She is diffusely tender.  There is no gross ligamentous laxity.  Calf is soft without cords no edema.  No open wounds appreciated  Skin:    General: Skin is warm and dry.  Neurological:     General: No focal deficit present.     Mental Status: She is alert.     GCS: GCS eye subscore is 4. GCS verbal subscore is 5. GCS motor subscore is 6.     Sensory: No sensory deficit.      Motor: No weakness.     Gait: Gait abnormal (Slight limp with walk).     ED Results / Procedures / Treatments   Labs (all labs ordered are listed, but only abnormal results are displayed) Labs Reviewed - No data to display  EKG None  Radiology DG Knee Complete 4 Views Right  Result Date: 01/29/2023 CLINICAL DATA:  Fall last  week.  Pain. EXAM: RIGHT KNEE - COMPLETE 4+ VIEW COMPARISON:  Right knee radiographs 09/21/2022 FINDINGS: No acute fracture, dislocation, or knee joint effusion is identified. Mild-to-moderate marginal spurring is again seen involving the medial and patellofemoral compartments. Atherosclerotic vascular calcifications are noted. IMPRESSION: No acute osseous abnormality. Electronically Signed   By: Sebastian Ache M.D.   On: 01/29/2023 11:28    Procedures Procedures    Medications Ordered in ED Medications - No data to display  ED Course/ Medical Decision Making/ A&P Clinical Course as of 01/29/23 1742  Mon Jan 29, 2023  1100 X-ray interpreted by me as no acute fracture or dislocation.  Awaiting radiology reading. [MB]    Clinical Course User Index [MB] Terrilee Files, MD                             Medical Decision Making Amount and/or Complexity of Data Reviewed Radiology: ordered.   This patient complains of right knee pain; this involves an extensive number of treatment Options and is a complaint that carries with it a high risk of complications and morbidity. The differential includes fracture, contusion, dislocation, hemarthrosis  I ordered imaging studies which included right knee x-ray and I independently    visualized and interpreted imaging which showed no acute findings Previous records obtained and reviewed in in epic no recent visits Social determinants considered, no significant barriers Critical Interventions: None  After the interventions stated above, I reevaluated the patient and found patient to be well-appearing in no  distress Admission and further testing considered, no indications for admission or further workup at this time.  I think knee immobilizer would impair her mobility even more and so recommended just using compression wrap and ice Tylenol following up with orthopedics.  Return instructions discussed.         Final Clinical Impression(s) / ED Diagnoses Final diagnoses:  Acute pain of right knee    Rx / DC Orders ED Discharge Orders     None         Terrilee Files, MD 01/29/23 1743

## 2023-01-31 DIAGNOSIS — R6889 Other general symptoms and signs: Secondary | ICD-10-CM | POA: Diagnosis not present

## 2023-01-31 DIAGNOSIS — M6281 Muscle weakness (generalized): Secondary | ICD-10-CM | POA: Diagnosis not present

## 2023-01-31 DIAGNOSIS — R2689 Other abnormalities of gait and mobility: Secondary | ICD-10-CM | POA: Diagnosis not present

## 2023-02-02 DIAGNOSIS — R6889 Other general symptoms and signs: Secondary | ICD-10-CM | POA: Diagnosis not present

## 2023-02-02 DIAGNOSIS — R2689 Other abnormalities of gait and mobility: Secondary | ICD-10-CM | POA: Diagnosis not present

## 2023-02-02 DIAGNOSIS — M6281 Muscle weakness (generalized): Secondary | ICD-10-CM | POA: Diagnosis not present

## 2023-02-14 DIAGNOSIS — R6889 Other general symptoms and signs: Secondary | ICD-10-CM | POA: Diagnosis not present

## 2023-02-14 DIAGNOSIS — R2689 Other abnormalities of gait and mobility: Secondary | ICD-10-CM | POA: Diagnosis not present

## 2023-02-14 DIAGNOSIS — M6281 Muscle weakness (generalized): Secondary | ICD-10-CM | POA: Diagnosis not present

## 2023-02-16 DIAGNOSIS — R2689 Other abnormalities of gait and mobility: Secondary | ICD-10-CM | POA: Diagnosis not present

## 2023-02-16 DIAGNOSIS — R6889 Other general symptoms and signs: Secondary | ICD-10-CM | POA: Diagnosis not present

## 2023-02-16 DIAGNOSIS — M6281 Muscle weakness (generalized): Secondary | ICD-10-CM | POA: Diagnosis not present

## 2023-02-19 ENCOUNTER — Ambulatory Visit: Payer: Medicare HMO | Admitting: Orthopedic Surgery

## 2023-02-19 ENCOUNTER — Encounter: Payer: Self-pay | Admitting: Orthopedic Surgery

## 2023-02-19 VITALS — BP 118/68 | HR 91 | Ht 63.0 in | Wt 170.0 lb

## 2023-02-19 DIAGNOSIS — M25561 Pain in right knee: Secondary | ICD-10-CM | POA: Diagnosis not present

## 2023-02-19 DIAGNOSIS — R6889 Other general symptoms and signs: Secondary | ICD-10-CM | POA: Diagnosis not present

## 2023-02-19 DIAGNOSIS — G8929 Other chronic pain: Secondary | ICD-10-CM | POA: Diagnosis not present

## 2023-02-19 MED ORDER — MELOXICAM 7.5 MG PO TABS: 7.5000 mg | ORAL_TABLET | Freq: Two times a day (BID) | ORAL | 0 refills | Status: DC

## 2023-02-19 NOTE — Patient Instructions (Signed)
While we are working on your approval for MRI please go ahead and call to schedule your appointment with [facility] within at least one (1) week.   Central Scheduling [phone]  

## 2023-02-19 NOTE — Progress Notes (Signed)
Chief Complaint  Patient presents with   Knee Problem    States has been going for PT helps some / hit knee last pm with increased pain in right knee had xrays in ER    77 year old female started having pain and giving way symptoms in her right knee.  She was sent to therapy by primary care.  She then injured her knee and went to the emergency room where x-rays were obtained there was no acute fracture she has been undergoing physical therapy with increasing pain  She took Norco and Tylenol without relief she also tried some topical medication  On examination she is well-developed well-nourished grooming and hygiene are normal mood and affect are normal she does have some anguish on her face from the pain in her right knee  The pain is located superiorly medially and inferiorly with a small to moderate-sized effusion she has weak extension poor range of motion without instability  McMurray's sign is equivocal mainly because of the positioning of the knee was inadequate because of pain to actually get a good test  X-rays show arthritis of the joint  Recommend MRI of the knee  Stop therapy  Start medication recommend stopping Vicodin  Meds ordered this encounter  Medications   meloxicam (MOBIC) 7.5 MG tablet    Sig: Take 1 tablet (7.5 mg total) by mouth 2 (two) times daily.    Dispense:  42 tablet    Refill:  0

## 2023-02-20 ENCOUNTER — Other Ambulatory Visit: Payer: Self-pay | Admitting: *Deleted

## 2023-02-20 ENCOUNTER — Telehealth: Payer: Self-pay

## 2023-02-20 DIAGNOSIS — Z7984 Long term (current) use of oral hypoglycemic drugs: Secondary | ICD-10-CM

## 2023-02-20 DIAGNOSIS — N1832 Chronic kidney disease, stage 3b: Secondary | ICD-10-CM

## 2023-02-20 DIAGNOSIS — Z7985 Long-term (current) use of injectable non-insulin antidiabetic drugs: Secondary | ICD-10-CM

## 2023-02-20 DIAGNOSIS — Z794 Long term (current) use of insulin: Secondary | ICD-10-CM

## 2023-02-20 MED ORDER — TRULICITY 3 MG/0.5ML ~~LOC~~ SOAJ: 3.0000 mg | SUBCUTANEOUS | 3 refills | Status: DC

## 2023-02-20 NOTE — Telephone Encounter (Signed)
Patient called asking for the pain medication that Dr. Romeo Apple was suppose to send in.  PATIENT USES [facility]

## 2023-02-20 NOTE — Telephone Encounter (Signed)
  Start medication recommend stopping Vicodin  Meds ordered this encounter Medications  meloxicam (MOBIC) 7.5 MG tablet     Sig: Take 1 tablet (7.5 mg total) by mouth 2 (two) times daily.     Dispense:  42 tablet     Refill:  0  Sent yesterday

## 2023-02-21 ENCOUNTER — Encounter: Payer: Self-pay | Admitting: Gastroenterology

## 2023-02-21 ENCOUNTER — Ambulatory Visit (INDEPENDENT_AMBULATORY_CARE_PROVIDER_SITE_OTHER): Payer: Medicare HMO | Admitting: Gastroenterology

## 2023-02-21 VITALS — BP 139/75 | HR 90 | Temp 96.6°F | Ht 63.0 in | Wt 169.7 lb

## 2023-02-21 DIAGNOSIS — R6889 Other general symptoms and signs: Secondary | ICD-10-CM | POA: Diagnosis not present

## 2023-02-21 DIAGNOSIS — K219 Gastro-esophageal reflux disease without esophagitis: Secondary | ICD-10-CM

## 2023-02-21 DIAGNOSIS — K5900 Constipation, unspecified: Secondary | ICD-10-CM

## 2023-02-21 MED ORDER — OMEPRAZOLE 40 MG PO CPDR: 40.0000 mg | DELAYED_RELEASE_CAPSULE | Freq: Every day | ORAL | 3 refills | Status: DC

## 2023-02-21 MED ORDER — ONDANSETRON HCL 4 MG PO TABS: 4.0000 mg | ORAL_TABLET | Freq: Three times a day (TID) | ORAL | 1 refills | Status: AC | PRN

## 2023-02-21 NOTE — Progress Notes (Signed)
Gastroenterology Office Note     Primary Care Physician:  Benita Stabile, MD  Primary Gastroenterologist: Dr. Marletta Lor    Chief Complaint   Chief Complaint  Patient presents with   Follow-up    Patient here today for a follow up on her Stephanie Moreno. She is still having issues with Stephanie Moreno on Pantoprazole 40 mg once bid.     History of Present Illness   Stephanie Moreno is a 77 y.o. female presenting today with a history of abdominal pain and nausea, constipation, GERD, anemia with component of IDA, chronic disease, with IDA resolved and previously evaluated. previously established with GI in [place] but moved to this area in June 2021.    Right knee with fluid. MRI upcoming.   Protonix BID. Feels sick on stomach in the mornings. Zofran has helped with nausea in the past. Has epigastric pain every other morning. Once having a BM will ease up some. BM once daily. Eats one good meal a day. Eases up after eating. Miralax can be pricey if taking daily.    Oct 2023: small hiatal hernia, mild Schatzki ring, s/p dilation. Gastritis s/p biopsy. Negative H.pylori.     Past Medical History:  Diagnosis Date   Acid reflux disease    Anemia    Anxiety    Asthma    Breast cancer (HCC)    left side   Chronic back pain    Chronic kidney disease    Depression    Diabetes mellitus without complication (HCC)    Diverticulitis    Hypertension    Lumbosacral spondylosis    Per previous PCP records   Sleep apnea    Thyroid disease     Past Surgical History:  Procedure Laterality Date   ABDOMINAL HYSTERECTOMY     BACK SURGERY     BALLOON DILATION N/A 04/24/2022   Procedure: BALLOON DILATION;  Surgeon: Lanelle Bal, DO;  [facility]: [facility];  Service: Endoscopy;  Laterality: N/A;   BIOPSY  02/19/2020   Procedure: BIOPSY;  Surgeon: Lanelle Bal, DO;  Location: [facility];  Service: Endoscopy;;   BIOPSY  04/24/2022   Procedure: BIOPSY;  Surgeon: Lanelle Bal, DO;   Location: [facility];  Service: Endoscopy;;   BREAST LUMPECTOMY Left 2003   BREAST SURGERY     COLONOSCOPY WITH PROPOFOL N/A 02/19/2020   non-bleeding internal hemorrhoids, many small-mouthed diverticula in entire colon.   ESOPHAGOGASTRODUODENOSCOPY (EGD) WITH PROPOFOL N/A 02/19/2020    benign-appearing esophageal stenosis s/p dilation, gastritis s/p biopsy, normal duodenum. Negative H.pylori, negative duodenal biopsy.    ESOPHAGOGASTRODUODENOSCOPY (EGD) WITH PROPOFOL N/A 04/24/2022   small hiatal hernia, mild Schatzki ring, s/p dilation. Gastritis s/p biopsy. Negative H.pylori.   GIVENS CAPSULE STUDY N/A 06/14/2020   Procedure: GIVENS CAPSULE STUDY;  Surgeon: Lanelle Bal, DO;  Location: [facility];  Service: Endoscopy;  Laterality: N/A;  7:30am   THYROIDECTOMY     tummy tuck     at time of breast surgery 2003    Current Outpatient Medications  Medication Sig Dispense Refill   acetaminophen (TYLENOL) 325 MG tablet Take 650 mg by mouth every 4 (four) hours as needed.     amLODipine (NORVASC) 10 MG tablet Take 5 mg by mouth daily.     Azelastine HCl 0.15 % SOLN Place 1 spray into both nostrils daily.     Continuous Blood Gluc Sensor (FREESTYLE LIBRE 2 SENSOR) MISC 2 each by Does not apply route  every 14 (fourteen) days. 2 each 2   Continuous Glucose Receiver (FREESTYLE LIBRE 2 READER) DEVI USE TO CHECK GLUCOSE AS DIRECTED 1 each 0   Continuous Glucose Receiver (FREESTYLE LIBRE 3 READER) DEVI Use the Reader to monitor your blood sugar readings daily 1 each 0   Continuous Glucose Sensor (FREESTYLE LIBRE 3 SENSOR) MISC 1 Application by Does not apply route every 14 (fourteen) days. 2 each 2   fluticasone (FLOVENT HFA) 220 MCG/ACT inhaler Inhale 1 puff into the lungs 2 (two) times daily.     Fluticasone Furoate (ARNUITY ELLIPTA) 200 MCG/ACT AEPB Inhale 1 puff into the lungs daily.     furosemide (LASIX) 20 MG tablet Take 1 tablet (20 mg total) by mouth every other day. Takes 40 mg  every other day. On alternate days take 80 mg (Patient taking differently: Take 20 mg by mouth 2 (two) times daily.)     hydrALAZINE (APRESOLINE) 50 MG tablet Take 50 mg by mouth 2 (two) times daily.     HYDROcodone-acetaminophen (NORCO) 5-325 MG tablet Take 1 tablet by mouth 2 (two) times daily as needed. 10 tablet 0   insulin degludec (TRESIBA FLEXTOUCH) 100 UNIT/ML FlexTouch Pen Inject 50 Units into the skin at bedtime. 45 mL 3   Insulin Pen Needle 31G X 6 MM MISC 1 each by Does not apply route daily. 90 each 1   lidocaine (LIDODERM) 5 % Place 1 patch onto the skin daily.     losartan (COZAAR) 25 MG tablet Take 25 mg by mouth daily.     lubiprostone (AMITIZA) 8 MCG capsule Take 8 mcg by mouth 2 (two) times daily with a meal.     meloxicam (MOBIC) 7.5 MG tablet Take 1 tablet (7.5 mg total) by mouth 2 (two) times daily. 42 tablet 0   mirabegron ER (MYRBETRIQ) 50 MG TB24 tablet TAKE 1 TABLET BY MOUTH DAILY AT  9 AM 90 tablet 3   ondansetron (ZOFRAN) 4 MG tablet TAKE 1 TABLET BY MOUTH EVERY 8 HOURS AS NEEDED FOR NAUSEA AND VOMITING (Patient taking differently: Take 4 mg by mouth every 8 (eight) hours as needed for nausea or vomiting.) 30 tablet 1   OVER THE COUNTER MEDICATION Prevagine one per day for memory.     pantoprazole (PROTONIX) 40 MG tablet TAKE 1 TABLET (40 MG TOTAL) BY MOUTH TWICE A DAY BEFORE MEALS (Patient taking differently: Take 40 mg by mouth 2 (two) times daily before a meal.) 180 tablet 1   rosuvastatin (CRESTOR) 10 MG tablet TAKE 1 TABLET BY MOUTH DAILY AT  9 PM (AT BEDTIME)     sucralfate (CARAFATE) 1 g tablet TAKE 1 TABLET IN THE MORNING, AT NOON, AND AT BEDTIME. MAY CRUSH AND MIX WITH WATER IF NEEDED. (Patient taking differently: Take 1 g by mouth 4 (four) times daily -  with meals and at bedtime.) 270 tablet 1   TRULICITY 3 MG/0.5ML SOPN Inject 3 mg into the skin once a week. 6 mL 3   No current facility-administered medications for this visit.    Allergies as of 02/21/2023  - Review Complete 02/21/2023  Allergen Reaction Noted   Codeine Nausea And Vomiting and Anaphylaxis 12/18/2019   Carvedilol  09/16/2021   Hydrochlorothiazide Other (See Comments) 12/18/2019   Lisinopril Nausea And Vomiting 12/18/2019   Oxybutynin Other (See Comments) 12/18/2019   Penicillins Hives 01/05/2020    Family History  Problem Relation Age of Onset   Diabetes Mother    Thyroid disease Mother  Colon cancer Neg Hx     Social History   Socioeconomic History   Marital status: Divorced    Spouse name: Not on file   Number of children: Not on file   Years of education: Not on file   Highest education level: Not on file  Occupational History   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 [place]  Tobacco Use   Smoking status: Never   Smokeless tobacco: Never  Vaping Use   Vaping status: Never Used  Substance and Sexual Activity   Alcohol use: Not Currently   Drug use: Not Currently   Sexual activity: Not on file  Other Topics Concern   Not on file  Social History Narrative   Not on file   Social Determinants of Health   Financial Resource Strain: Not on file  Food Insecurity: Not on file  Transportation Needs: Not on file  Physical Activity: Not on file  Stress: Not on file  Social Connections: Not on file  Intimate Partner Violence: Not on file     Review of Systems   Gen: Denies any fever, chills, fatigue, weight loss, lack of appetite.  CV: Denies chest pain, heart palpitations, peripheral edema, syncope.  Resp: Denies shortness of breath at rest or with exertion. Denies wheezing or cough.  GI: Denies dysphagia or odynophagia. Denies jaundice, hematemesis, fecal incontinence. GU : Denies urinary burning, urinary frequency, urinary hesitancy MS: Denies joint pain, muscle weakness, cramps, or limitation of movement.  Derm: Denies rash, itching, dry skin Psych: Denies depression, anxiety, memory loss, and confusion Heme: Denies  bruising, bleeding, and enlarged lymph nodes.   Physical Exam   BP 139/75 (BP Location: Left Arm, Patient Position: Sitting, Cuff Size: Large)   Pulse 90   Temp (!) 96.6 F (35.9 C) (Temporal)   Ht 5\' 3"  (1.6 m)   Wt 169 lb 11.2 oz (77 kg)   BMI 30.06 kg/m  General:   Alert and oriented. Pleasant and cooperative. Well-nourished and well-developed.  Head:  Normocephalic and atraumatic. Eyes:  Without icterus Abdomen:  +BS, soft, non-tender and non-distended. No HSM noted. No guarding or rebound. No masses appreciated.  Rectal:  Deferred  Msk:  Symmetrical without gross deformities. Normal posture. Extremities:  Without edema. Neurologic:  Alert and  oriented x4;  grossly normal neurologically. Skin:  Intact without significant lesions or rashes. Psych:  Alert and cooperative. Normal mood and affect.   Assessment   Stephanie Moreno is a 77 y.o. female presenting today with a history of  abdominal pain and nausea, constipation, GERD, anemia with component of IDA, chronic disease, with IDA resolved and previously evaluated. previously established with GI in [place] but moved to this area in June 2021.    GERD: chronically on pantoprazole. Some exacerbations. EGD on file. Will change to omeprazole once daily  Abdominal pain: improved after BM. Difficult to take Miralax daily due to cost; however, this has helped her in the past. Will provide samples and coupons.      PLAN    Stop pantoprazole. Start omeprazole Miralax daily prn Call in 4-6 weeks: consider imaging if persistent abdominal pain despite bowel regimen Return in December    Gelene Mink, PhD, [facility]

## 2023-02-21 NOTE — Patient Instructions (Signed)
Let's stop pantoprazole. Instead, take omeprazole once each morning, 30 minutes before breakfast.  I have sent in Zofran to take as needed for nausea.   I have given samples and coupons for Miralax.  Please call us in 4-6 weeks with an update!  We will see you in December!  I enjoyed seeing you again today! I value our relationship and want to provide genuine, compassionate, and quality care. You may receive a survey regarding your visit with me, and I welcome your feedback! Thanks so much for taking the time to complete this. I look forward to seeing you again.      Gelene Mink, PhD, ANP-BC [facility]

## 2023-03-03 ENCOUNTER — Ambulatory Visit (HOSPITAL_COMMUNITY)
Admission: RE | Admit: 2023-03-03 | Discharge: 2023-03-03 | Disposition: A | Discharge status: Home / Self Care | Payer: Medicare HMO | Source: Ambulatory Visit | Attending: Orthopedic Surgery | Admitting: Orthopedic Surgery

## 2023-03-03 DIAGNOSIS — M25361 Other instability, right knee: Secondary | ICD-10-CM | POA: Diagnosis not present

## 2023-03-03 DIAGNOSIS — G8929 Other chronic pain: Secondary | ICD-10-CM | POA: Diagnosis not present

## 2023-03-03 DIAGNOSIS — M1711 Unilateral primary osteoarthritis, right knee: Secondary | ICD-10-CM | POA: Diagnosis not present

## 2023-03-03 DIAGNOSIS — M25561 Pain in right knee: Secondary | ICD-10-CM | POA: Insufficient documentation

## 2023-03-03 DIAGNOSIS — M25461 Effusion, right knee: Secondary | ICD-10-CM | POA: Diagnosis not present

## 2023-03-08 ENCOUNTER — Telehealth: Payer: Self-pay | Admitting: Orthopedic Surgery

## 2023-03-08 NOTE — Telephone Encounter (Signed)
DR. Romeo Apple   Patient called and left a voicemail at 3:21 pm wants to know the results of her MRI and she needs some pain medicine called in her knee is killing her.

## 2023-03-08 NOTE — Telephone Encounter (Signed)
She has appointment to review the MRI / it has not resulted yet  She is asking for pain meds and MRI reports

## 2023-03-09 ENCOUNTER — Other Ambulatory Visit: Payer: Self-pay | Admitting: Orthopedic Surgery

## 2023-03-09 DIAGNOSIS — G8929 Other chronic pain: Secondary | ICD-10-CM

## 2023-03-09 DIAGNOSIS — M25561 Pain in right knee: Secondary | ICD-10-CM

## 2023-03-12 ENCOUNTER — Telehealth: Payer: Self-pay

## 2023-03-15 ENCOUNTER — Ambulatory Visit (INDEPENDENT_AMBULATORY_CARE_PROVIDER_SITE_OTHER): Payer: Medicare HMO | Admitting: Orthopedic Surgery

## 2023-03-15 DIAGNOSIS — G8929 Other chronic pain: Secondary | ICD-10-CM

## 2023-03-15 DIAGNOSIS — M1711 Unilateral primary osteoarthritis, right knee: Secondary | ICD-10-CM | POA: Diagnosis not present

## 2023-03-15 MED ORDER — METHYLPREDNISOLONE ACETATE 40 MG/ML IJ SUSP
40.0000 mg | Freq: Once | INTRAMUSCULAR | Status: AC
Start: 2023-03-15 — End: 2023-03-15
  Administered 2023-03-15: 40 mg via INTRA_ARTICULAR

## 2023-03-15 NOTE — Progress Notes (Signed)
Follow-up visit after MRI of the right knee  I interpreted the knee MRI : shows arthritis free edge fraying lateral meniscus.  The patient's lumbar surgery was completed within the last year  These are not surgical lesions on MRI and her plain films do not warrant total knee arthroplasty  Recommend arthritis treatment Patient education Selective injection Anti-inflammatories Acetaminophen Exercises Activity modification  Procedure note right knee injection   verbal consent was obtained to inject right knee joint  Timeout was completed to confirm the site of injection  The medications used were depomedrol 40 mg and 1% lidocaine 3 cc Anesthesia was provided by ethyl chloride and the skin was prepped with alcohol.  After cleaning the skin with alcohol a 20-gauge needle was used to inject the right knee joint. There were no complications. A sterile bandage was applied.

## 2023-03-15 NOTE — Addendum Note (Signed)
Addended byCaffie Damme on: 03/15/2023 11:13 AM   Modules accepted: Orders

## 2023-03-15 NOTE — Patient Instructions (Addendum)
For pain you can use topical arthritis medication that you can get over-the-counter capsaicin Bengay etc.  Take Tylenol 500 mg every 6 hours  Take your meloxicam daily  Wear the brace  Use the walker  Gentle knee exercises  Give the Meloxicam more time  We will send referral to [facility] in [place] for pain management , they will call you for appointment the number there is [phone]

## 2023-03-15 NOTE — Addendum Note (Signed)
Addended byCaffie Damme on: 03/15/2023 11:09 AM   Modules accepted: Orders

## 2023-03-19 ENCOUNTER — Other Ambulatory Visit (HOSPITAL_COMMUNITY): Admission: RE | Admit: 2023-03-19 | Payer: Medicare HMO | Source: Ambulatory Visit | Admitting: Nephrology

## 2023-03-19 DIAGNOSIS — E1122 Type 2 diabetes mellitus with diabetic chronic kidney disease: Secondary | ICD-10-CM | POA: Diagnosis not present

## 2023-03-19 DIAGNOSIS — E559 Vitamin D deficiency, unspecified: Secondary | ICD-10-CM | POA: Diagnosis not present

## 2023-03-19 DIAGNOSIS — N189 Chronic kidney disease, unspecified: Secondary | ICD-10-CM | POA: Diagnosis not present

## 2023-03-19 DIAGNOSIS — D631 Anemia in chronic kidney disease: Secondary | ICD-10-CM | POA: Diagnosis not present

## 2023-03-19 DIAGNOSIS — Z79899 Other long term (current) drug therapy: Secondary | ICD-10-CM | POA: Diagnosis not present

## 2023-03-19 DIAGNOSIS — I129 Hypertensive chronic kidney disease with stage 1 through stage 4 chronic kidney disease, or unspecified chronic kidney disease: Secondary | ICD-10-CM | POA: Diagnosis not present

## 2023-03-19 DIAGNOSIS — R809 Proteinuria, unspecified: Secondary | ICD-10-CM | POA: Diagnosis not present

## 2023-03-20 DIAGNOSIS — E785 Hyperlipidemia, unspecified: Secondary | ICD-10-CM | POA: Diagnosis not present

## 2023-03-20 DIAGNOSIS — E539 Vitamin B deficiency, unspecified: Secondary | ICD-10-CM | POA: Diagnosis not present

## 2023-03-20 DIAGNOSIS — E1165 Type 2 diabetes mellitus with hyperglycemia: Secondary | ICD-10-CM | POA: Diagnosis not present

## 2023-03-20 DIAGNOSIS — I10 Essential (primary) hypertension: Secondary | ICD-10-CM | POA: Diagnosis not present

## 2023-03-20 DIAGNOSIS — D509 Iron deficiency anemia, unspecified: Secondary | ICD-10-CM | POA: Diagnosis not present

## 2023-03-20 DIAGNOSIS — R6889 Other general symptoms and signs: Secondary | ICD-10-CM | POA: Diagnosis not present

## 2023-03-23 DIAGNOSIS — E1129 Type 2 diabetes mellitus with other diabetic kidney complication: Secondary | ICD-10-CM | POA: Diagnosis not present

## 2023-03-23 DIAGNOSIS — E1122 Type 2 diabetes mellitus with diabetic chronic kidney disease: Secondary | ICD-10-CM | POA: Diagnosis not present

## 2023-03-23 DIAGNOSIS — R809 Proteinuria, unspecified: Secondary | ICD-10-CM | POA: Diagnosis not present

## 2023-03-23 DIAGNOSIS — I129 Hypertensive chronic kidney disease with stage 1 through stage 4 chronic kidney disease, or unspecified chronic kidney disease: Secondary | ICD-10-CM | POA: Diagnosis not present

## 2023-03-25 DIAGNOSIS — R809 Proteinuria, unspecified: Secondary | ICD-10-CM | POA: Insufficient documentation

## 2023-03-26 DIAGNOSIS — M25551 Pain in right hip: Secondary | ICD-10-CM | POA: Insufficient documentation

## 2023-03-26 DIAGNOSIS — R0600 Dyspnea, unspecified: Secondary | ICD-10-CM | POA: Diagnosis not present

## 2023-03-26 DIAGNOSIS — N1831 Chronic kidney disease, stage 3a: Secondary | ICD-10-CM | POA: Diagnosis not present

## 2023-03-26 DIAGNOSIS — K5900 Constipation, unspecified: Secondary | ICD-10-CM | POA: Diagnosis not present

## 2023-03-26 DIAGNOSIS — E1165 Type 2 diabetes mellitus with hyperglycemia: Secondary | ICD-10-CM | POA: Diagnosis not present

## 2023-03-26 DIAGNOSIS — I129 Hypertensive chronic kidney disease with stage 1 through stage 4 chronic kidney disease, or unspecified chronic kidney disease: Secondary | ICD-10-CM | POA: Diagnosis not present

## 2023-03-26 DIAGNOSIS — M25552 Pain in left hip: Secondary | ICD-10-CM | POA: Diagnosis not present

## 2023-03-26 DIAGNOSIS — K30 Functional dyspepsia: Secondary | ICD-10-CM | POA: Diagnosis not present

## 2023-03-26 DIAGNOSIS — E782 Mixed hyperlipidemia: Secondary | ICD-10-CM | POA: Diagnosis not present

## 2023-03-28 DIAGNOSIS — R2689 Other abnormalities of gait and mobility: Secondary | ICD-10-CM | POA: Diagnosis not present

## 2023-03-28 DIAGNOSIS — M25562 Pain in left knee: Secondary | ICD-10-CM | POA: Diagnosis not present

## 2023-03-28 DIAGNOSIS — M25561 Pain in right knee: Secondary | ICD-10-CM | POA: Diagnosis not present

## 2023-03-28 DIAGNOSIS — M6281 Muscle weakness (generalized): Secondary | ICD-10-CM | POA: Diagnosis not present

## 2023-04-01 ENCOUNTER — Other Ambulatory Visit: Payer: Self-pay | Admitting: Orthopedic Surgery

## 2023-04-01 DIAGNOSIS — M25561 Pain in right knee: Secondary | ICD-10-CM

## 2023-04-01 DIAGNOSIS — G8929 Other chronic pain: Secondary | ICD-10-CM

## 2023-04-05 ENCOUNTER — Telehealth: Payer: Self-pay | Admitting: Orthopedic Surgery

## 2023-04-05 NOTE — Telephone Encounter (Signed)
Tried to return the patient's call, phone just rang and rang.  Pt had lvm stating that she was here to see Dr. Romeo Apple about 3 weeks ago and she still in a lot of pain and would like an appt

## 2023-04-09 DIAGNOSIS — M25561 Pain in right knee: Secondary | ICD-10-CM | POA: Diagnosis not present

## 2023-04-09 DIAGNOSIS — M25562 Pain in left knee: Secondary | ICD-10-CM | POA: Diagnosis not present

## 2023-04-09 DIAGNOSIS — R2689 Other abnormalities of gait and mobility: Secondary | ICD-10-CM | POA: Diagnosis not present

## 2023-04-09 DIAGNOSIS — M6281 Muscle weakness (generalized): Secondary | ICD-10-CM | POA: Diagnosis not present

## 2023-04-11 DIAGNOSIS — M6281 Muscle weakness (generalized): Secondary | ICD-10-CM | POA: Diagnosis not present

## 2023-04-11 DIAGNOSIS — M25561 Pain in right knee: Secondary | ICD-10-CM | POA: Diagnosis not present

## 2023-04-11 DIAGNOSIS — M25562 Pain in left knee: Secondary | ICD-10-CM | POA: Diagnosis not present

## 2023-04-11 DIAGNOSIS — R2689 Other abnormalities of gait and mobility: Secondary | ICD-10-CM | POA: Diagnosis not present

## 2023-04-16 DIAGNOSIS — M25561 Pain in right knee: Secondary | ICD-10-CM | POA: Diagnosis not present

## 2023-04-16 DIAGNOSIS — M6281 Muscle weakness (generalized): Secondary | ICD-10-CM | POA: Diagnosis not present

## 2023-04-16 DIAGNOSIS — R2689 Other abnormalities of gait and mobility: Secondary | ICD-10-CM | POA: Diagnosis not present

## 2023-04-16 DIAGNOSIS — M25562 Pain in left knee: Secondary | ICD-10-CM | POA: Diagnosis not present

## 2023-04-18 ENCOUNTER — Ambulatory Visit (INDEPENDENT_AMBULATORY_CARE_PROVIDER_SITE_OTHER): Payer: Medicare HMO | Admitting: Nurse Practitioner

## 2023-04-18 ENCOUNTER — Other Ambulatory Visit: Payer: Self-pay | Admitting: Nurse Practitioner

## 2023-04-18 ENCOUNTER — Encounter: Payer: Self-pay | Admitting: Nurse Practitioner

## 2023-04-18 VITALS — BP 134/84 | HR 74 | Ht 63.0 in | Wt 165.2 lb

## 2023-04-18 DIAGNOSIS — N1832 Chronic kidney disease, stage 3b: Secondary | ICD-10-CM

## 2023-04-18 DIAGNOSIS — Z794 Long term (current) use of insulin: Secondary | ICD-10-CM

## 2023-04-18 DIAGNOSIS — E782 Mixed hyperlipidemia: Secondary | ICD-10-CM | POA: Diagnosis not present

## 2023-04-18 DIAGNOSIS — I10 Essential (primary) hypertension: Secondary | ICD-10-CM | POA: Diagnosis not present

## 2023-04-18 DIAGNOSIS — Z7985 Long-term (current) use of injectable non-insulin antidiabetic drugs: Secondary | ICD-10-CM | POA: Diagnosis not present

## 2023-04-18 DIAGNOSIS — Z7984 Long term (current) use of oral hypoglycemic drugs: Secondary | ICD-10-CM | POA: Diagnosis not present

## 2023-04-18 DIAGNOSIS — E1122 Type 2 diabetes mellitus with diabetic chronic kidney disease: Secondary | ICD-10-CM | POA: Diagnosis not present

## 2023-04-18 MED ORDER — TRESIBA FLEXTOUCH 100 UNIT/ML ~~LOC~~ SOPN: 35.0000 [IU] | PEN_INJECTOR | Freq: Every day | SUBCUTANEOUS | 3 refills | Status: DC

## 2023-04-18 MED ORDER — INSULIN PEN NEEDLE 31G X 6 MM MISC: 1.0000 | Freq: Every day | 3 refills | Status: DC

## 2023-04-18 NOTE — Progress Notes (Signed)
04/18/2023, 1:50 PM  Endocrinology follow-up note   Subjective:    Patient ID: Stephanie Moreno, female    DOB: 1945-10-14.  Stephanie Moreno is being seen in follow-up after she was seen in consultation for management of currently uncontrolled symptomatic diabetes requested by  Benita Stabile, MD.   Past Medical History:  Diagnosis Date   Acid reflux disease    Anemia    Anxiety    Asthma    Breast cancer (HCC)    left side   Chronic back pain    Chronic kidney disease    Depression    Diabetes mellitus without complication (HCC)    Diverticulitis    Hypertension    Lumbosacral spondylosis    Per previous PCP records   Sleep apnea    Thyroid disease     Past Surgical History:  Procedure Laterality Date   ABDOMINAL HYSTERECTOMY     BACK SURGERY     BALLOON DILATION N/A 04/24/2022   Procedure: BALLOON DILATION;  Surgeon: Lanelle Bal, DO;  [facility]: [facility];  Service: Endoscopy;  Laterality: N/A;   BIOPSY  02/19/2020   Procedure: BIOPSY;  Surgeon: Lanelle Bal, DO;  [facility]: [facility];  Service: Endoscopy;;   BIOPSY  04/24/2022   Procedure: BIOPSY;  Surgeon: Lanelle Bal, DO;  Location: [facility];  Service: Endoscopy;;   BREAST LUMPECTOMY Left 2003   BREAST SURGERY     COLONOSCOPY WITH PROPOFOL N/A 02/19/2020   non-bleeding internal hemorrhoids, many small-mouthed diverticula in entire colon.   ESOPHAGOGASTRODUODENOSCOPY (EGD) WITH PROPOFOL N/A 02/19/2020    benign-appearing esophageal stenosis s/p dilation, gastritis s/p biopsy, normal duodenum. Negative H.pylori, negative duodenal biopsy.    ESOPHAGOGASTRODUODENOSCOPY (EGD) WITH PROPOFOL N/A 04/24/2022   small hiatal hernia, mild Schatzki ring, s/p dilation. Gastritis s/p biopsy. Negative H.pylori.   GIVENS CAPSULE STUDY N/A 06/14/2020   Procedure: GIVENS CAPSULE STUDY;  Surgeon: Lanelle Bal, DO;  Location: [facility];  Service: Endoscopy;  Laterality: N/A;   7:30am   THYROIDECTOMY     tummy tuck     at time of breast surgery 2003    Social History   Socioeconomic History   Marital status: Divorced    Spouse name: Not on file   Number of children: Not on file   Years of education: Not on file   Highest education level: Not on file  Occupational History   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 [place]  Tobacco Use   Smoking status: Never   Smokeless tobacco: Never  Vaping Use   Vaping status: Never Used  Substance and Sexual Activity   Alcohol use: Not Currently   Drug use: Not Currently   Sexual activity: Not on file  Other Topics Concern   Not on file  Social History Narrative   Not on file   Social Determinants of Health   Financial Resource Strain: Not on file  Food Insecurity: Not on file  Transportation Needs: Not on file  Physical Activity: Not on file  Stress: Not on file  Social Connections: Not on file    Family History  Problem Relation Age of Onset   Diabetes Mother    Thyroid disease Mother    Colon cancer Neg Hx     Outpatient Encounter Medications as of 04/18/2023  Medication Sig   acetaminophen (TYLENOL) 325 MG tablet Take 650 mg by  mouth every 4 (four) hours as needed.   amLODipine (NORVASC) 10 MG tablet Take 5 mg by mouth daily.   Azelastine HCl 0.15 % SOLN Place 1 spray into both nostrils daily.   Continuous Glucose Receiver (FREESTYLE LIBRE 3 READER) DEVI Use the Reader to monitor your blood sugar readings daily   Continuous Glucose Sensor (FREESTYLE LIBRE 3 SENSOR) MISC 1 Application by Does not apply route every 14 (fourteen) days.   fluticasone (FLOVENT HFA) 220 MCG/ACT inhaler Inhale 1 puff into the lungs 2 (two) times daily.   Fluticasone Furoate (ARNUITY ELLIPTA) 200 MCG/ACT AEPB Inhale 1 puff into the lungs daily.   furosemide (LASIX) 20 MG tablet Take 1 tablet (20 mg total) by mouth every other day. Takes 40 mg every other day. On alternate days take 80 mg (Patient  taking differently: Take 20 mg by mouth 2 (two) times daily.)   hydrALAZINE (APRESOLINE) 50 MG tablet Take 50 mg by mouth 2 (two) times daily.   HYDROcodone-acetaminophen (NORCO) 5-325 MG tablet Take 1 tablet by mouth 2 (two) times daily as needed.   lidocaine (LIDODERM) 5 % Place 1 patch onto the skin daily.   losartan (COZAAR) 25 MG tablet Take 25 mg by mouth daily.   lubiprostone (AMITIZA) 8 MCG capsule Take 8 mcg by mouth 2 (two) times daily with a meal.   meloxicam (MOBIC) 7.5 MG tablet TAKE 1 TABLET BY MOUTH 2 TIMES DAILY.   mirabegron ER (MYRBETRIQ) 50 MG TB24 tablet TAKE 1 TABLET BY MOUTH DAILY AT  9 AM   omeprazole (PRILOSEC) 40 MG capsule Take 1 capsule (40 mg total) by mouth daily before breakfast.   ondansetron (ZOFRAN) 4 MG tablet TAKE 1 TABLET BY MOUTH EVERY 8 HOURS AS NEEDED FOR NAUSEA AND VOMITING (Patient taking differently: Take 4 mg by mouth every 8 (eight) hours as needed for nausea or vomiting.)   ondansetron (ZOFRAN) 4 MG tablet Take 1 tablet (4 mg total) by mouth every 8 (eight) hours as needed for nausea or vomiting.   OVER THE COUNTER MEDICATION Prevagine one per day for memory.   pantoprazole (PROTONIX) 40 MG tablet TAKE 1 TABLET (40 MG TOTAL) BY MOUTH TWICE A DAY BEFORE MEALS (Patient taking differently: Take 40 mg by mouth 2 (two) times daily before a meal.)   rosuvastatin (CRESTOR) 10 MG tablet TAKE 1 TABLET BY MOUTH DAILY AT  9 PM (AT BEDTIME)   sucralfate (CARAFATE) 1 g tablet TAKE 1 TABLET IN THE MORNING, AT NOON, AND AT BEDTIME. MAY CRUSH AND MIX WITH WATER IF NEEDED. (Patient taking differently: Take 1 g by mouth 4 (four) times daily -  with meals and at bedtime.)   TRULICITY 3 MG/0.5ML SOPN Inject 3 mg into the skin once a week.   [DISCONTINUED] insulin degludec (TRESIBA FLEXTOUCH) 100 UNIT/ML FlexTouch Pen Inject 50 Units into the skin at bedtime.   [DISCONTINUED] Insulin Pen Needle 31G X 6 MM MISC 1 each by Does not apply route daily.   Continuous Blood Gluc  Sensor (FREESTYLE LIBRE 2 SENSOR) MISC 2 each by Does not apply route every 14 (fourteen) days. (Patient not taking: Reported on 04/18/2023)   Continuous Glucose Receiver (FREESTYLE LIBRE 2 READER) DEVI USE TO CHECK GLUCOSE AS DIRECTED (Patient not taking: Reported on 04/18/2023)   insulin degludec (TRESIBA FLEXTOUCH) 100 UNIT/ML FlexTouch Pen Inject 35 Units into the skin at bedtime.   Insulin Pen Needle 31G X 6 MM MISC 1 each by Does not apply route daily.  No facility-administered encounter medications on file as of 04/18/2023.    ALLERGIES: Allergies  Allergen Reactions   Codeine Nausea And Vomiting and Anaphylaxis   Carvedilol     Cold sweats, diarrhea, severe hot flashes   Hydrochlorothiazide Other (See Comments)    Other reaction(s): stomach upset   Lisinopril Nausea And Vomiting    Other reaction(s): cough   Oxybutynin Other (See Comments)   Penicillins Hives    VACCINATION STATUS: Immunization History  Administered Date(s) Administered   Fluad Quad(high Dose 65+) 03/17/2021   Pneumococcal Conjugate-13 09/02/2014   Pneumococcal Polysaccharide-23 07/03/2010   Tdap 07/03/2000    Diabetes She presents for her follow-up diabetic visit. She has type 2 diabetes mellitus. Onset time: Diagnosed at approx age of 23. Her disease course has been improving. There are no hypoglycemic associated symptoms. Associated symptoms include fatigue, foot paresthesias and weight loss. Pertinent negatives for diabetes include no polydipsia and no polyuria. There are no hypoglycemic complications. Symptoms are stable. Diabetic complications include a CVA, nephropathy and peripheral neuropathy. Risk factors for coronary artery disease include diabetes mellitus, dyslipidemia, hypertension, post-menopausal and sedentary lifestyle. Current diabetic treatment includes insulin injections (and Trulicity). She is compliant with treatment most of the time. Her weight is decreasing steadily. She is following a  generally healthy diet. When asked about meal planning, she reported none. She has not had a previous visit with a dietitian. She rarely participates in exercise. Her home blood glucose trend is decreasing steadily. Her overall blood glucose range is 110-130 mg/dl. (She presents today with her CGM showing tightening goal glycemic profile overall.  Her most recent A1c on 9/17 was 7%, improving from last visit of 7.7%.  Analysis of her CGM shows TIR 94%, TAR 6%, TBR 0% with a GMI of 6.1%.  She denies any hypoglycemia.  ) An ACE inhibitor/angiotensin II receptor blocker is being taken. She does not see a podiatrist.Eye exam is current.  Hypertension This is a chronic problem. The current episode started more than 1 year ago. The problem is unchanged. The problem is controlled. Associated symptoms include peripheral edema. Agents associated with hypertension include thyroid hormones. Risk factors for coronary artery disease include diabetes mellitus, dyslipidemia, post-menopausal state and sedentary lifestyle. Past treatments include diuretics, beta blockers, ACE inhibitors and angiotensin blockers. There are no compliance problems.  Hypertensive end-organ damage includes kidney disease and CVA. Identifiable causes of hypertension include chronic renal disease, sleep apnea and a thyroid problem.  Hyperlipidemia This is a chronic problem. The current episode started more than 1 year ago. The problem is controlled. Recent lipid tests were reviewed and are normal. Exacerbating diseases include chronic renal disease and hypothyroidism. Factors aggravating her hyperlipidemia include beta blockers. Current antihyperlipidemic treatment includes statins. The current treatment provides moderate improvement of lipids. There are no compliance problems.  Risk factors for coronary artery disease include diabetes mellitus, dyslipidemia, hypertension, obesity, post-menopausal and a sedentary lifestyle.     Review of  systems  Constitutional: + steadily decreasing body weight,  current Body mass index is 29.26 kg/m. , + fatigue, no subjective hyperthermia, no subjective hypothermia Eyes: no blurry vision, no xerophthalmia ENT: no sore throat, no nodules palpated in throat, no dysphagia/odynophagia, no hoarseness Cardiovascular: no chest pain, no shortness of breath, no palpitations, + ankle swelling Respiratory: no cough, no shortness of breath Gastrointestinal: no nausea/vomiting/diarrhea Genitourinary: + polyuria, stress incontinence (improved now that off SGLT2i) Musculoskeletal: ongoing back pain-will be getting injections soon, walks with walker due to previous CVA and  disequilibrium- has frequent falls Skin: no rashes, no hyperemia Neurological: no tremors, no dizziness, + numbness/tingling to BLE (on gabapentin) Psychiatric: no depression, no anxiety  Objective:     BP 134/84 (BP Location: Left Arm, Patient Position: Sitting, Cuff Size: Large)   Pulse 74   Ht 5\' 3"  (1.6 m)   Wt 165 lb 3.2 oz (74.9 kg)   BMI 29.26 kg/m   Wt Readings from Last 3 Encounters:  04/18/23 165 lb 3.2 oz (74.9 kg)  02/21/23 169 lb 11.2 oz (77 kg)  02/19/23 170 lb (77.1 kg)    BP Readings from Last 3 Encounters:  04/18/23 134/84  02/21/23 139/75  02/19/23 118/68     Physical Exam- Limited  Constitutional:  Body mass index is 29.26 kg/m. , not in acute distress, normal state of mind Eyes:  EOMI, no exophthalmos Musculoskeletal: no gross deformities,  walks with walker due to disequilibrium and frequent falls Skin:  no rashes, no hyperemia Neurological: no tremor with outstretched hands   Diabetic Foot Exam - Simple   Simple Foot Form Diabetic Foot exam was performed with the following findings: Yes 04/18/2023  1:43 PM  Visual Inspection No deformities, no ulcerations, no other skin breakdown bilaterally: Yes Sensation Testing Intact to touch and monofilament testing bilaterally: Yes Pulse  Check Posterior Tibialis and Dorsalis pulse intact bilaterally: Yes Comments     CMP ( most recent) CMP     Component Value Date/Time   NA 141 07/25/2022 1232   K 4.2 07/25/2022 1232   CL 105 07/25/2022 1232   CO2 17 (L) 07/25/2022 1232   GLUCOSE 102 (H) 07/25/2022 1232   GLUCOSE 185 (H) 04/24/2022 1251   BUN 16 07/25/2022 1232   CREATININE 1.58 (H) 07/25/2022 1232   CREATININE 1.42 (H) 10/12/2020 1009   CALCIUM 9.7 07/25/2022 1232   PROT 8.1 07/25/2022 1232   ALBUMIN 4.5 07/25/2022 1232   AST 22 07/25/2022 1232   ALT 19 07/25/2022 1232   ALKPHOS 102 07/25/2022 1232   BILITOT 0.3 07/25/2022 1232   GFRNONAA 35 (L) 04/19/2022 1045   GFRNONAA 36 (L) 10/12/2020 1009   GFRAA 42 (L) 10/12/2020 1009     Diabetic Labs (most recent): Lab Results  Component Value Date   HGBA1C 7.7 (A) 01/15/2023   HGBA1C 8.3 (A) 04/20/2022   HGBA1C 8.7 12/19/2021   MICROALBUR 80 10/26/2020     Lipid Panel ( most recent) Lipid Panel     Component Value Date/Time   CHOL 91 04/19/2021 0000   TRIG 78 04/19/2021 0000   HDL 42 04/19/2021 0000   CHOLHDL 2.0 10/12/2020 1009   LDLCALC 34 04/19/2021 0000   LDLCALC 46 10/12/2020 1009      Lab Results  Component Value Date   TSH 3.94 10/12/2020   TSH 1.18 03/09/2020      Assessment & Plan:   1) Type 2 diabetes mellitus with stage 3b chronic kidney disease, without long-term current use of insulin (HCC)  - Stephanie Moreno has currently uncontrolled symptomatic type 2 DM since  77 years of age.  She presents today with her CGM showing tightening goal glycemic profile overall.  Her most recent A1c on 9/17 was 7%, improving from last visit of 7.7%.  Analysis of her CGM shows TIR 94%, TAR 6%, TBR 0% with a GMI of 6.1%.  She denies any hypoglycemia.     Recent labs reviewed.    - I had a long discussion with her about the progressive nature  of diabetes and the pathology behind its complications. -her diabetes is complicated by CKD, CVA and  she remains at a high risk for more acute and chronic complications which include CAD, CVA, CKD, retinopathy, and neuropathy. These are all discussed in detail with her.  - Nutritional counseling repeated at each appointment due to patients tendency to fall back in to old habits.  - The patient admits there is a room for improvement in their diet and drink choices. -  Suggestion is made for the patient to avoid simple carbohydrates from their diet including Cakes, Sweet Desserts / Pastries, Ice Cream, Soda (diet and regular), Sweet Tea, Candies, Chips, Cookies, Sweet Pastries, Store Bought Juices, Alcohol in Excess of 1-2 drinks a day, Artificial Sweeteners, Coffee Creamer, and "Sugar-free" Products. This will help patient to have stable blood glucose profile and potentially avoid unintended weight gain.   - I encouraged the patient to switch to unprocessed or minimally processed complex starch and increased protein intake (animal or plant source), fruits, and vegetables.   - Patient is advised to stick to a routine mealtimes to eat 3 meals a day and avoid unnecessary snacks (to snack only to correct hypoglycemia).  - I have approached her with the following individualized plan to manage  her diabetes and patient agrees:   - she will need at least basal insulin in order for her to achieve control of diabetes to target, and she accepts this treatment option at this time.    -Based on her tightening glycemic profile, will lower her Evaristo Bury to 35 units SQ nightly, and she can continue her Trulicity 3 mg SQ weekly.  She can stay off Farxiga (and like products- due to polyuria and stress incontinence) and Glipizide (due to hypoglycemia).  -She is encouraged to continue monitoring blood glucose twice daily (using her CGM), before breakfast and before bed, and to call the clinic if she has readings less than 70 or greater than 300 for 3 tests in a row.   - Specific targets for  A1c;  LDL, HDL,  and  Triglycerides were discussed with the patient.  2) Blood Pressure /Hypertension:   Her blood pressure is controlled to target for her age.   she is advised to continue her current medications including Losartan 25 mg p.o. daily with breakfast, and Lasix 20 every other day.  Will defer changes to nephrologist.  3) Lipids/Hyperlipidemia:    Review of her recent lipid panel from 09/14/22 showed controlled LDL at 45.  she  is advised to continue Crestor 10 mg p.o. daily at bedtime.   Side effects and precautions discussed with her.  4)  Weight/Diet:  Her Body mass index is 29.26 kg/m.  -   clearly complicating her diabetes care.   she is  a candidate for weight loss. I discussed with her the fact that loss of 5 - 10% of her  current body weight will have the most impact on her diabetes management.  Exercise, and detailed carbohydrates information provided  -  detailed on discharge instructions.  5) Chronic Care/Health Maintenance: -she  is on ACEI/ARB and Statin medications and  is encouraged to initiate and continue to follow up with Ophthalmology, Dentist,  Podiatrist at least yearly or according to recommendations, and advised to  stay away from smoking. I have recommended yearly flu vaccine and pneumonia vaccine at least every 5 years; she cannot exercise optimally due to her disequilibrium.  She is advised to sleep for at least 7 hours  a day.  - she is  advised to maintain close follow up with Benita Stabile, MD for primary care needs, as well as her other providers for optimal and coordinated care.     I spent  37  minutes in the care of the patient today including review of labs from CMP, Lipids, Thyroid Function, Hematology (current and previous including abstractions from other facilities); face-to-face time discussing  her blood glucose readings/logs, discussing hypoglycemia and hyperglycemia episodes and symptoms, medications doses, her options of short and long term treatment based on the  latest standards of care / guidelines;  discussion about incorporating lifestyle medicine;  and documenting the encounter. Risk reduction counseling performed per USPSTF guidelines to reduce obesity and cardiovascular risk factors.     Please refer to Patient Instructions for Blood Glucose Monitoring and Insulin/Medications Dosing Guide"  in media tab for additional information. Please  also refer to " Patient Self Inventory" in the Media  tab for reviewed elements of pertinent patient history.  Stephanie Moreno participated in the discussions, expressed understanding, and voiced agreement with the above plans.  All questions were answered to her satisfaction. she is encouraged to contact clinic should she have any questions or concerns prior to her return visit.   Follow up plan: - Return in about 4 months (around 08/19/2023) for Diabetes F/U with A1c in office, No previsit labs, Bring meter and logs.  Ronny Bacon, [facility] [address] [place], [place] [address] Phone: [phone] Fax: [phone]  04/18/2023, 1:50 PM

## 2023-04-19 ENCOUNTER — Encounter: Payer: Self-pay | Admitting: Orthopedic Surgery

## 2023-04-19 ENCOUNTER — Ambulatory Visit: Payer: Medicare HMO | Admitting: Orthopedic Surgery

## 2023-04-19 ENCOUNTER — Other Ambulatory Visit (INDEPENDENT_AMBULATORY_CARE_PROVIDER_SITE_OTHER): Payer: Self-pay

## 2023-04-19 VITALS — BP 148/81 | HR 85 | Ht 63.0 in | Wt 165.0 lb

## 2023-04-19 DIAGNOSIS — G8929 Other chronic pain: Secondary | ICD-10-CM

## 2023-04-19 DIAGNOSIS — M25562 Pain in left knee: Secondary | ICD-10-CM | POA: Diagnosis not present

## 2023-04-19 DIAGNOSIS — M1712 Unilateral primary osteoarthritis, left knee: Secondary | ICD-10-CM

## 2023-04-19 DIAGNOSIS — M4326 Fusion of spine, lumbar region: Secondary | ICD-10-CM | POA: Diagnosis not present

## 2023-04-19 MED ORDER — METHYLPREDNISOLONE ACETATE 40 MG/ML IJ SUSP
40.0000 mg | Freq: Once | INTRAMUSCULAR | Status: AC
Start: 2023-04-19 — End: 2023-04-19
  Administered 2023-04-19: 40 mg via INTRA_ARTICULAR

## 2023-04-19 NOTE — Patient Instructions (Signed)

## 2023-04-19 NOTE — Progress Notes (Signed)
Chief Complaint  Patient presents with   Knee Pain    L/ hurts from thigh area to below knee/hurting about a month now.   History 77 year old female status post previous spinal fusion complains of pain in her left leg from just above the kneecap to about the middle lower leg.  She complains of medial knee pain for the last 3 months no trauma  She has significant problems walking prior to surgery this has persisted after surgery.  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, lumbar spondylolisthesis L5-S1   POST-OPERATIVE DIAGNOSIS:  neurogenic claudication L4-5 and L5-S1, lumbar spondylolisthesis L5-S1   PROCEDURE:  Procedure(s): CENTRAL LAMINECTOMY LUMBAR FOUR-FIVE  AND LUMBAR FIVE-SACRAL ONE,TRANSFORAMINAL LUMBAR INTERBODY FUSION LEFT LUMBAR FIVE-SACRAL ONE WITH GLOBUS PEDICLE SCREWS, RODS AND SABLE CAGE, LOCAL BONE GRAFT, ALLOGRAFT BONE GRAFT    SURGEON:  Kerrin Champagne, MD   Her exam shows a positive effusion medial joint line tenderness passive range of motion is normal she does have adequate extension power  Her hip exam was negative and she was nontender in her lower back  We took imaging of the knee arthritis and showed in the knee it was grade 2 she had adequate joint space but definitive osteophytes.  She has obvious weakness in this leg it is most likely related to the lumbar spine condition she has mild arthritis in the knee with effusion  I attempted aspiration of the knee could not get any fluid out I did inject her with 40 mg of Depo-Medrol as noted below  Procedure note injection and aspiration left knee joint  Verbal consent was obtained to aspirate and inject the left knee joint   Timeout was completed to confirm the site of aspiration and injection  An 18-gauge needle was used to  aspirate the left knee joint from a suprapatellar lateral approach.  The medications used were 40 mg of Depo-Medrol and 1% lidocaine 3 cc  Anesthesia was provided by ethyl chloride and the skin was prepped with alcohol.  After cleaning the skin with alcohol an 18-gauge needle was used to aspirate the right knee joint.  We obtained 0 cc of fluid   We followed this by injection of 40 mg of Depo-Medrol and 3 cc 1% lidocaine.  There were no complications. A sterile bandage was applied.   Brace applied hinged knee brace economy hinged type wear for support  Follow-up as needed most of her symptoms of weakness and giving way are coming from the spinal condition

## 2023-04-23 DIAGNOSIS — M25561 Pain in right knee: Secondary | ICD-10-CM | POA: Diagnosis not present

## 2023-04-23 DIAGNOSIS — R2689 Other abnormalities of gait and mobility: Secondary | ICD-10-CM | POA: Diagnosis not present

## 2023-04-23 DIAGNOSIS — M6281 Muscle weakness (generalized): Secondary | ICD-10-CM | POA: Diagnosis not present

## 2023-04-23 DIAGNOSIS — M25562 Pain in left knee: Secondary | ICD-10-CM | POA: Diagnosis not present

## 2023-04-25 DIAGNOSIS — M25562 Pain in left knee: Secondary | ICD-10-CM | POA: Diagnosis not present

## 2023-04-25 DIAGNOSIS — M6281 Muscle weakness (generalized): Secondary | ICD-10-CM | POA: Diagnosis not present

## 2023-04-25 DIAGNOSIS — M25561 Pain in right knee: Secondary | ICD-10-CM | POA: Diagnosis not present

## 2023-04-25 DIAGNOSIS — R2689 Other abnormalities of gait and mobility: Secondary | ICD-10-CM | POA: Diagnosis not present

## 2023-04-26 DIAGNOSIS — E1165 Type 2 diabetes mellitus with hyperglycemia: Secondary | ICD-10-CM | POA: Diagnosis not present

## 2023-04-30 ENCOUNTER — Other Ambulatory Visit: Payer: Self-pay | Admitting: Gastroenterology

## 2023-04-30 ENCOUNTER — Other Ambulatory Visit: Payer: Self-pay | Admitting: Nurse Practitioner

## 2023-04-30 DIAGNOSIS — M6281 Muscle weakness (generalized): Secondary | ICD-10-CM | POA: Diagnosis not present

## 2023-04-30 DIAGNOSIS — R2689 Other abnormalities of gait and mobility: Secondary | ICD-10-CM | POA: Diagnosis not present

## 2023-04-30 DIAGNOSIS — M25562 Pain in left knee: Secondary | ICD-10-CM | POA: Diagnosis not present

## 2023-04-30 DIAGNOSIS — M25561 Pain in right knee: Secondary | ICD-10-CM | POA: Diagnosis not present

## 2023-05-02 DIAGNOSIS — M25562 Pain in left knee: Secondary | ICD-10-CM | POA: Diagnosis not present

## 2023-05-02 DIAGNOSIS — M25561 Pain in right knee: Secondary | ICD-10-CM | POA: Diagnosis not present

## 2023-05-02 DIAGNOSIS — R2689 Other abnormalities of gait and mobility: Secondary | ICD-10-CM | POA: Diagnosis not present

## 2023-05-02 DIAGNOSIS — M6281 Muscle weakness (generalized): Secondary | ICD-10-CM | POA: Diagnosis not present

## 2023-05-07 ENCOUNTER — Other Ambulatory Visit: Payer: Self-pay | Admitting: Gastroenterology

## 2023-05-14 DIAGNOSIS — M6281 Muscle weakness (generalized): Secondary | ICD-10-CM | POA: Diagnosis not present

## 2023-05-14 DIAGNOSIS — R2689 Other abnormalities of gait and mobility: Secondary | ICD-10-CM | POA: Diagnosis not present

## 2023-05-14 DIAGNOSIS — M25562 Pain in left knee: Secondary | ICD-10-CM | POA: Diagnosis not present

## 2023-05-14 DIAGNOSIS — M25561 Pain in right knee: Secondary | ICD-10-CM | POA: Diagnosis not present

## 2023-05-16 DIAGNOSIS — H52223 Regular astigmatism, bilateral: Secondary | ICD-10-CM | POA: Diagnosis not present

## 2023-05-16 DIAGNOSIS — H524 Presbyopia: Secondary | ICD-10-CM | POA: Diagnosis not present

## 2023-05-16 DIAGNOSIS — H2513 Age-related nuclear cataract, bilateral: Secondary | ICD-10-CM | POA: Diagnosis not present

## 2023-05-16 DIAGNOSIS — Z7984 Long term (current) use of oral hypoglycemic drugs: Secondary | ICD-10-CM | POA: Diagnosis not present

## 2023-05-16 DIAGNOSIS — E119 Type 2 diabetes mellitus without complications: Secondary | ICD-10-CM | POA: Diagnosis not present

## 2023-05-16 DIAGNOSIS — Z794 Long term (current) use of insulin: Secondary | ICD-10-CM | POA: Diagnosis not present

## 2023-05-16 DIAGNOSIS — H40013 Open angle with borderline findings, low risk, bilateral: Secondary | ICD-10-CM | POA: Diagnosis not present

## 2023-05-16 LAB — HM DIABETES EYE EXAM

## 2023-05-20 ENCOUNTER — Other Ambulatory Visit: Payer: Self-pay | Admitting: Gastroenterology

## 2023-05-20 ENCOUNTER — Other Ambulatory Visit: Payer: Self-pay | Admitting: Nurse Practitioner

## 2023-05-21 NOTE — Progress Notes (Deleted)
Name: Stephanie Moreno DOB: 10/10/1945 MRN: [id number]  History of Present Illness: [name] is a 77 y.o. female who presents today for follow up visit at [facility]. - GU history: 1. OAB with urinary frequency, nocturia, urgency, and urge incontinence. - ***Taking Myrbetriq 50 mg daily. - ***Likely exacerbated by T2DM, glucosuria (take [place]), OSA.  At last visit with Dr. Ronne Binning on 12/01/2022: The plan was: - Continue Myrbetriq 50 mg daily. - "The patient is instructed to contact Dr. Lucio Edward office to discuss alternative to farxiga since this medication is exacerbating her urinary isssues."  Since last visit: ***  Today: She reports ***  She reports {Blank multiple:19197::"improved","persistent / unchanged"} urinary ***frequency, ***nocturia, ***urgency, and ***urge incontinence. Voiding ***x/day and ***x/night on average. Leaking ***x/day on average; using *** ***pads / ***diapers per day on average.  She {Actions; denies-reports:120008} caffeine intake (*** caffeinated beverages per day on average).  She {Actions; denies-reports:120008} dysuria, gross hematuria, straining to void, or sensations of incomplete emptying.   Fall Screening: Do you usually have a device to assist in your mobility? {yes/no:20286} ***cane / ***walker / ***wheelchair   Medications: Current Outpatient Medications  Medication Sig Dispense Refill   acetaminophen (TYLENOL) 325 MG tablet Take 650 mg by mouth every 4 (four) hours as needed.     amLODipine (NORVASC) 10 MG tablet Take 5 mg by mouth daily.     Azelastine HCl 0.15 % SOLN Place 1 spray into both nostrils daily.     Continuous Blood Gluc Sensor (FREESTYLE LIBRE 2 SENSOR) MISC 2 each by Does not apply route every 14 (fourteen) days. 2 each 2   Continuous Glucose Receiver (FREESTYLE LIBRE 2 READER) DEVI USE TO CHECK GLUCOSE AS DIRECTED 1 each 0   Continuous Glucose Receiver (FREESTYLE LIBRE 3 READER) DEVI Use the Reader  to monitor your blood sugar readings daily 1 each 0   Continuous Glucose Sensor (FREESTYLE LIBRE 3 SENSOR) MISC 1 Application by Does not apply route every 14 (fourteen) days. 2 each 2   fluticasone (FLOVENT HFA) 220 MCG/ACT inhaler Inhale 1 puff into the lungs 2 (two) times daily.     Fluticasone Furoate (ARNUITY ELLIPTA) 200 MCG/ACT AEPB Inhale 1 puff into the lungs daily.     furosemide (LASIX) 20 MG tablet Take 1 tablet (20 mg total) by mouth every other day. Takes 40 mg every other day. On alternate days take 80 mg (Patient taking differently: Take 20 mg by mouth 2 (two) times daily.)     hydrALAZINE (APRESOLINE) 50 MG tablet Take 50 mg by mouth 2 (two) times daily.     HYDROcodone-acetaminophen (NORCO) 5-325 MG tablet Take 1 tablet by mouth 2 (two) times daily as needed. 10 tablet 0   insulin glargine (LANTUS SOLOSTAR) 100 UNIT/ML Solostar Pen INJECT 35 UNITS INTO THE SKIN AT BEDTIME. 30 mL 1   Insulin Pen Needle 31G X 6 MM MISC 1 each by Does not apply route daily. 100 each 3   lidocaine (LIDODERM) 5 % Place 1 patch onto the skin daily.     losartan (COZAAR) 25 MG tablet Take 25 mg by mouth daily.     lubiprostone (AMITIZA) 8 MCG capsule Take 8 mcg by mouth 2 (two) times daily with a meal.     meloxicam (MOBIC) 7.5 MG tablet TAKE 1 TABLET BY MOUTH 2 TIMES DAILY. 42 tablet 0   mirabegron ER (MYRBETRIQ) 50 MG TB24 tablet TAKE 1 TABLET BY MOUTH DAILY AT  9 AM 90  tablet 3   omeprazole (PRILOSEC) 40 MG capsule Take 1 capsule (40 mg total) by mouth daily before breakfast. 90 capsule 3   ondansetron (ZOFRAN) 4 MG tablet TAKE 1 TABLET BY MOUTH EVERY 8 HOURS AS NEEDED FOR NAUSEA AND VOMITING (Patient taking differently: Take 4 mg by mouth every 8 (eight) hours as needed for nausea or vomiting.) 30 tablet 1   ondansetron (ZOFRAN) 4 MG tablet Take 1 tablet (4 mg total) by mouth every 8 (eight) hours as needed for nausea or vomiting. 30 tablet 1   OVER THE COUNTER MEDICATION Prevagine one per day for  memory.     pantoprazole (PROTONIX) 40 MG tablet TAKE 1 TABLET (40 MG TOTAL) BY MOUTH TWICE A DAY BEFORE MEALS (Patient taking differently: Take 40 mg by mouth 2 (two) times daily before a meal.) 180 tablet 1   rosuvastatin (CRESTOR) 10 MG tablet TAKE 1 TABLET BY MOUTH DAILY AT  9 PM (AT BEDTIME)     sucralfate (CARAFATE) 1 g tablet Take 1 tablet (1 g total) by mouth 4 (four) times daily -  with meals and at bedtime. 270 tablet 1   TRULICITY 3 MG/0.5ML SOPN Inject 3 mg into the skin once a week. 6 mL 3   No current facility-administered medications for this visit.    Allergies: Allergies  Allergen Reactions   Codeine Nausea And Vomiting and Anaphylaxis   Carvedilol     Cold sweats, diarrhea, severe hot flashes   Hydrochlorothiazide Other (See Comments)    Other reaction(s): stomach upset   Lisinopril Nausea And Vomiting    Other reaction(s): cough   Oxybutynin Other (See Comments)   Penicillins Hives    Past Medical History:  Diagnosis Date   Acid reflux disease    Anemia    Anxiety    Asthma    Breast cancer (HCC)    left side   Chronic back pain    Chronic kidney disease    Depression    Diabetes mellitus without complication (HCC)    Diverticulitis    Hypertension    Lumbosacral spondylosis    Per previous PCP records   Sleep apnea    Thyroid disease    Past Surgical History:  Procedure Laterality Date   ABDOMINAL HYSTERECTOMY     BACK SURGERY     BALLOON DILATION N/A 04/24/2022   Procedure: BALLOON DILATION;  Surgeon: Lanelle Bal, DO;  [facility];  Service: Endoscopy;  Laterality: N/A;   BIOPSY  02/19/2020   Procedure: BIOPSY;  Surgeon: Lanelle Bal, DO;  [facility];  Service: Endoscopy;;   BIOPSY  04/24/2022   Procedure: BIOPSY;  Surgeon: Lanelle Bal, DO;  [facility];  Service: Endoscopy;;   BREAST LUMPECTOMY Left 2003   BREAST SURGERY     COLONOSCOPY WITH PROPOFOL N/A 02/19/2020   non-bleeding  internal hemorrhoids, many small-mouthed diverticula in entire colon.   ESOPHAGOGASTRODUODENOSCOPY (EGD) WITH PROPOFOL N/A 02/19/2020    benign-appearing esophageal stenosis s/p dilation, gastritis s/p biopsy, normal duodenum. Negative H.pylori, negative duodenal biopsy.    ESOPHAGOGASTRODUODENOSCOPY (EGD) WITH PROPOFOL N/A 04/24/2022   small hiatal hernia, mild Schatzki ring, s/p dilation. Gastritis s/p biopsy. Negative H.pylori.   GIVENS CAPSULE STUDY N/A 06/14/2020   Procedure: GIVENS CAPSULE STUDY;  Surgeon: Lanelle Bal, DO;  [facility];  Service: Endoscopy;  Laterality: N/A;  7:30am   THYROIDECTOMY     tummy tuck     at time  of breast surgery 2003   Family History  Problem Relation Age of Onset   Diabetes Mother    Thyroid disease Mother    Colon cancer Neg Hx    Social History   Socioeconomic History   Marital status: Divorced    Spouse name: Not on file   Number of children: Not on file   Years of education: Not on file   Highest education level: Not on file  Occupational History   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 [place]  Tobacco Use   Smoking status: Never   Smokeless tobacco: Never  Vaping Use   Vaping status: Never Used  Substance and Sexual Activity   Alcohol use: Not Currently   Drug use: Not Currently   Sexual activity: Not on file  Other Topics Concern   Not on file  Social History Narrative   Not on file   Social Determinants of Health   Financial Resource Strain: Not on file  Food Insecurity: Not on file  Transportation Needs: Not on file  Physical Activity: Not on file  Stress: Not on file  Social Connections: Not on file  Intimate Partner Violence: Not on file    Review of Systems*** Constitutional: Patient denies any unintentional weight loss or change in strength lntegumentary: Patient denies any rashes or pruritus Eyes: Patient {Actions; denies-reports:120008} dry eyes ENT: Patient {Actions;  denies-reports:120008} dry mouth Cardiovascular: Patient denies chest pain or syncope Respiratory: Patient denies shortness of breath Gastrointestinal: Patient ***denies nausea, vomiting, constipation, or diarrhea Musculoskeletal: Patient denies muscle cramps or weakness Neurologic: Patient denies convulsions or seizures Allergic/Immunologic: Patient denies recent allergic reaction(s) Hematologic/Lymphatic: Patient denies bleeding tendencies Endocrine: Patient denies heat/cold intolerance  GU: As per HPI.  OBJECTIVE There were no vitals filed for this visit. There is no height or weight on file to calculate BMI.  Physical Examination*** Constitutional: No obvious distress; patient is non-toxic appearing  Cardiovascular: No visible lower extremity edema.  Respiratory: The patient does not have audible wheezing/stridor; respirations do not appear labored  Gastrointestinal: Abdomen non-distended Musculoskeletal: Normal ROM of UEs  Skin: No obvious rashes/open sores  Neurologic: CN 2-12 grossly intact Psychiatric: Answered questions appropriately with normal affect  Hematologic/Lymphatic/Immunologic: No obvious bruises or sites of spontaneous bleeding  UA: ***negative *** WBC/hpf, *** RBC/hpf, bacteria (***) PVR: *** ml  ASSESSMENT No diagnosis found. *** We discussed the symptoms of overactive bladder (OAB), which include urinary urgency, frequency, nocturia, with or without urge incontinence.   While we may not know the exact etiology of OAB, several risk factors can be identified.  - We discussed this patient's neurogenic risk factors for OAB-type symptoms including ***T2DM, ***nicotine use, ***.  - Likely exacerbated by ***diuretic use, ***caffeine intake, ***consumption of bladder irritants such as (acidic foods, spicy foods, alcohol).   We discussed the following management options in detail including potential benefits, risks, and side effects: Behavioral therapy: Modify  fluid intake Decreasing bladder irritants (such as caffeine, acidic foods, spicy foods, alcohol) Urge suppression strategies Bladder retraining / timed voiding Double voiding Medication(s): - For anticholinergic medications, we discussed the potential side effects of anticholinergics including dry eyes, dry mouth, constipation, cognitive impairment and urinary retention.  - For beta-3 agonist medication, we discussed the risk for urinary retention and the potential side effect of elevated blood pressure specific to Myrbetriq (which is more likely to occur in individuals with uncontrolled hypertension).  For refractory cases: PTNS (posterior tibial nerve stimulation) Sacral neuromodulation trial (  Medtronic lnterStim or Axonics implant) Bladder Botox injections In extreme cases, SP tube placement  ***In order to further evaluate urinary incontinence, She was instructed to complete a 3-day bladder diary.  ***Discussed recommendation for urodynamic testing, which was described in detail including risks such as bleeding, infection, organ/tissue/nerve damage.  She decided to proceed with *** ***work on behavioral modifications including ***minimizing caffeine intake and working on ***timed voiding.  Will plan for follow up in ***8 weeks / *** months / ***1 year or sooner if needed. Pt verbalized understanding and agreement. All questions were answered.  PLAN Advised the following: *** ***. Minimize caffeine intake. ***. Work on timed voiding. ***. Urge suppression strategies. ***. Double/ triple voiding. ***. No follow-ups on file.  No orders of the defined types were placed in this encounter.   It has been explained that the patient is to follow regularly with their PCP in addition to all other providers involved in their care and to follow instructions provided by these respective offices. Patient advised to contact urology clinic if any urologic-pertaining questions, concerns, new  symptoms or problems arise in the interim period.  There are no Patient Instructions on file for this visit.  Electronically signed by:  Donnita Falls, FNP   05/21/23    3:05 PM

## 2023-05-22 DIAGNOSIS — M25562 Pain in left knee: Secondary | ICD-10-CM | POA: Diagnosis not present

## 2023-05-22 DIAGNOSIS — R2689 Other abnormalities of gait and mobility: Secondary | ICD-10-CM | POA: Diagnosis not present

## 2023-05-22 DIAGNOSIS — M6281 Muscle weakness (generalized): Secondary | ICD-10-CM | POA: Diagnosis not present

## 2023-05-22 DIAGNOSIS — M25561 Pain in right knee: Secondary | ICD-10-CM | POA: Diagnosis not present

## 2023-05-24 DIAGNOSIS — M6281 Muscle weakness (generalized): Secondary | ICD-10-CM | POA: Diagnosis not present

## 2023-05-24 DIAGNOSIS — M25561 Pain in right knee: Secondary | ICD-10-CM | POA: Diagnosis not present

## 2023-05-24 DIAGNOSIS — R2689 Other abnormalities of gait and mobility: Secondary | ICD-10-CM | POA: Diagnosis not present

## 2023-05-24 DIAGNOSIS — M25562 Pain in left knee: Secondary | ICD-10-CM | POA: Diagnosis not present

## 2023-05-28 ENCOUNTER — Ambulatory Visit: Payer: Medicare HMO | Admitting: Urology

## 2023-05-28 DIAGNOSIS — N3281 Overactive bladder: Secondary | ICD-10-CM

## 2023-05-28 DIAGNOSIS — N3941 Urge incontinence: Secondary | ICD-10-CM

## 2023-05-29 DIAGNOSIS — R2689 Other abnormalities of gait and mobility: Secondary | ICD-10-CM | POA: Diagnosis not present

## 2023-05-29 DIAGNOSIS — M6281 Muscle weakness (generalized): Secondary | ICD-10-CM | POA: Diagnosis not present

## 2023-05-29 DIAGNOSIS — M25562 Pain in left knee: Secondary | ICD-10-CM | POA: Diagnosis not present

## 2023-05-29 DIAGNOSIS — M25561 Pain in right knee: Secondary | ICD-10-CM | POA: Diagnosis not present

## 2023-05-30 DIAGNOSIS — M6281 Muscle weakness (generalized): Secondary | ICD-10-CM | POA: Diagnosis not present

## 2023-05-30 DIAGNOSIS — M25561 Pain in right knee: Secondary | ICD-10-CM | POA: Diagnosis not present

## 2023-05-30 DIAGNOSIS — R2689 Other abnormalities of gait and mobility: Secondary | ICD-10-CM | POA: Diagnosis not present

## 2023-05-30 DIAGNOSIS — M25562 Pain in left knee: Secondary | ICD-10-CM | POA: Diagnosis not present

## 2023-06-04 ENCOUNTER — Telehealth: Payer: Self-pay | Admitting: Nurse Practitioner

## 2023-06-04 ENCOUNTER — Other Ambulatory Visit: Payer: Self-pay | Admitting: Nurse Practitioner

## 2023-06-04 ENCOUNTER — Other Ambulatory Visit: Payer: Self-pay | Admitting: Gastroenterology

## 2023-06-04 NOTE — Telephone Encounter (Signed)
Patient was called and made aware that she should lower her Evaristo Bury further to 15 units at night, she is call us in a week with her readings. Patient verbalized understanding.

## 2023-06-13 DIAGNOSIS — M6281 Muscle weakness (generalized): Secondary | ICD-10-CM | POA: Diagnosis not present

## 2023-06-13 DIAGNOSIS — M25562 Pain in left knee: Secondary | ICD-10-CM | POA: Diagnosis not present

## 2023-06-13 DIAGNOSIS — R2689 Other abnormalities of gait and mobility: Secondary | ICD-10-CM | POA: Diagnosis not present

## 2023-06-13 DIAGNOSIS — M25561 Pain in right knee: Secondary | ICD-10-CM | POA: Diagnosis not present

## 2023-06-15 DIAGNOSIS — M25561 Pain in right knee: Secondary | ICD-10-CM | POA: Diagnosis not present

## 2023-06-15 DIAGNOSIS — R2689 Other abnormalities of gait and mobility: Secondary | ICD-10-CM | POA: Diagnosis not present

## 2023-06-15 DIAGNOSIS — M25562 Pain in left knee: Secondary | ICD-10-CM | POA: Diagnosis not present

## 2023-06-15 DIAGNOSIS — M6281 Muscle weakness (generalized): Secondary | ICD-10-CM | POA: Diagnosis not present

## 2023-06-20 ENCOUNTER — Ambulatory Visit (INDEPENDENT_AMBULATORY_CARE_PROVIDER_SITE_OTHER): Payer: Medicare HMO | Admitting: Gastroenterology

## 2023-06-20 ENCOUNTER — Encounter: Payer: Self-pay | Admitting: Gastroenterology

## 2023-06-20 VITALS — BP 114/67 | HR 84 | Temp 98.4°F | Ht 63.0 in | Wt 166.2 lb

## 2023-06-20 DIAGNOSIS — K2100 Gastro-esophageal reflux disease with esophagitis, without bleeding: Secondary | ICD-10-CM

## 2023-06-20 DIAGNOSIS — Z8719 Personal history of other diseases of the digestive system: Secondary | ICD-10-CM | POA: Diagnosis not present

## 2023-06-20 DIAGNOSIS — K5900 Constipation, unspecified: Secondary | ICD-10-CM | POA: Diagnosis not present

## 2023-06-20 DIAGNOSIS — R1310 Dysphagia, unspecified: Secondary | ICD-10-CM

## 2023-06-20 DIAGNOSIS — K219 Gastro-esophageal reflux disease without esophagitis: Secondary | ICD-10-CM | POA: Diagnosis not present

## 2023-06-20 MED ORDER — LUBIPROSTONE 8 MCG PO CAPS: 8.0000 ug | ORAL_CAPSULE | Freq: Two times a day (BID) | ORAL | 3 refills | Status: DC

## 2023-06-20 MED ORDER — OMEPRAZOLE 40 MG PO CPDR: 40.0000 mg | DELAYED_RELEASE_CAPSULE | Freq: Every day | ORAL | 3 refills | Status: AC

## 2023-06-20 MED ORDER — LEVOCETIRIZINE DIHYDROCHLORIDE 5 MG PO TABS: 5.0000 mg | ORAL_TABLET | Freq: Every evening | ORAL | 3 refills | Status: DC

## 2023-06-20 NOTE — Progress Notes (Unsigned)
Gastroenterology Office Note     Primary Care Physician:  Benita Stabile, MD  Primary Gastroenterologist:   Chief Complaint   Chief Complaint  Patient presents with   Follow-up    Pt arrives for follow up. Pt is needing refill on Amitiza and Omeprazole. Pt states no issues/concerns/questions today. Prescription for allergies if possible      History of Present Illness   Stephanie Moreno is a 77 y.o. female presenting today with a history of     77 y.o. female presenting today with a history of abdominal pain and nausea, constipation, GERD, anemia with component of IDA, chronic disease, with IDA resolved and previously evaluated. previously established with GI in [place] but moved to this area in June 2021.    Mild dysphagia at times. Wants to hold off on EGD.   Oct 2023: small hiatal hernia, mild Schatzki ring, s/p dilation. Gastritis s/p biopsy. Negative H.pylori.    Past Medical History:  Diagnosis Date   Acid reflux disease    Anemia    Anxiety    Asthma    Breast cancer (HCC)    left side   Chronic back pain    Chronic kidney disease    Depression    Diabetes mellitus without complication (HCC)    Diverticulitis    Hypertension    Lumbosacral spondylosis    Per previous PCP records   Sleep apnea    Thyroid disease     Past Surgical History:  Procedure Laterality Date   ABDOMINAL HYSTERECTOMY     BACK SURGERY     BALLOON DILATION N/A 04/24/2022   Procedure: BALLOON DILATION;  Surgeon: Lanelle Bal, DO;  [facility];  Service: Endoscopy;  Laterality: N/A;   BIOPSY  02/19/2020   Procedure: BIOPSY;  Surgeon: Lanelle Bal, DO;  [facility];  Service: Endoscopy;;   BIOPSY  04/24/2022   Procedure: BIOPSY;  Surgeon: Lanelle Bal, DO;  [facility];  Service: Endoscopy;;   BREAST LUMPECTOMY Left 2003   BREAST SURGERY     COLONOSCOPY WITH PROPOFOL N/A 02/19/2020   non-bleeding internal hemorrhoids, many  small-mouthed diverticula in entire colon.   ESOPHAGOGASTRODUODENOSCOPY (EGD) WITH PROPOFOL N/A 02/19/2020    benign-appearing esophageal stenosis s/p dilation, gastritis s/p biopsy, normal duodenum. Negative H.pylori, negative duodenal biopsy.    ESOPHAGOGASTRODUODENOSCOPY (EGD) WITH PROPOFOL N/A 04/24/2022   small hiatal hernia, mild Schatzki ring, s/p dilation. Gastritis s/p biopsy. Negative H.pylori.   GIVENS CAPSULE STUDY N/A 06/14/2020   Procedure: GIVENS CAPSULE STUDY;  Surgeon: Lanelle Bal, DO;  [facility];  Service: Endoscopy;  Laterality: N/A;  7:30am   THYROIDECTOMY     tummy tuck     at time of breast surgery 2003    Current Outpatient Medications  Medication Sig Dispense Refill   acetaminophen (TYLENOL) 325 MG tablet Take 650 mg by mouth every 4 (four) hours as needed.     amLODipine (NORVASC) 10 MG tablet Take 5 mg by mouth daily.     Azelastine HCl 0.15 % SOLN Place 1 spray into both nostrils daily.     Continuous Blood Gluc Sensor (FREESTYLE LIBRE 2 SENSOR) MISC 2 each by Does not apply route every 14 (fourteen) days. 2 each 2   Continuous Glucose Receiver (FREESTYLE LIBRE 2 READER) DEVI USE TO CHECK GLUCOSE AS DIRECTED 1 each 0   Continuous Glucose Receiver (FREESTYLE LIBRE 3 READER) DEVI Use the Reader to  monitor your blood sugar readings daily 1 each 0   Continuous Glucose Sensor (FREESTYLE LIBRE 3 SENSOR) MISC 1 Application by Does not apply route every 14 (fourteen) days. 2 each 2   fluticasone (FLOVENT HFA) 220 MCG/ACT inhaler Inhale 1 puff into the lungs 2 (two) times daily.     Fluticasone Furoate (ARNUITY ELLIPTA) 200 MCG/ACT AEPB Inhale 1 puff into the lungs daily.     furosemide (LASIX) 20 MG tablet Take 1 tablet (20 mg total) by mouth every other day. Takes 40 mg every other day. On alternate days take 80 mg (Patient taking differently: Take 20 mg by mouth 2 (two) times daily.)     hydrALAZINE (APRESOLINE) 50 MG tablet Take 50 mg by mouth 2 (two)  times daily.     HYDROcodone-acetaminophen (NORCO) 5-325 MG tablet Take 1 tablet by mouth 2 (two) times daily as needed. 10 tablet 0   insulin glargine (LANTUS SOLOSTAR) 100 UNIT/ML Solostar Pen INJECT 35 UNITS INTO THE SKIN AT BEDTIME. 30 mL 1   Insulin Pen Needle 31G X 6 MM MISC 1 each by Does not apply route daily. 100 each 3   lidocaine (LIDODERM) 5 % Place 1 patch onto the skin daily.     losartan (COZAAR) 25 MG tablet Take 25 mg by mouth daily.     lubiprostone (AMITIZA) 8 MCG capsule Take 8 mcg by mouth 2 (two) times daily with a meal.     meloxicam (MOBIC) 7.5 MG tablet TAKE 1 TABLET BY MOUTH 2 TIMES DAILY. 42 tablet 0   mirabegron ER (MYRBETRIQ) 50 MG TB24 tablet TAKE 1 TABLET BY MOUTH DAILY AT  9 AM 90 tablet 3   omeprazole (PRILOSEC) 40 MG capsule Take 1 capsule (40 mg total) by mouth daily before breakfast. 90 capsule 3   ondansetron (ZOFRAN) 4 MG tablet TAKE 1 TABLET BY MOUTH EVERY 8 HOURS AS NEEDED FOR NAUSEA AND VOMITING (Patient taking differently: Take 4 mg by mouth every 8 (eight) hours as needed for nausea or vomiting.) 30 tablet 1   ondansetron (ZOFRAN) 4 MG tablet Take 1 tablet (4 mg total) by mouth every 8 (eight) hours as needed for nausea or vomiting. 30 tablet 1   OVER THE COUNTER MEDICATION Prevagine one per day for memory.     pantoprazole (PROTONIX) 40 MG tablet Take 1 tablet (40 mg total) by mouth 2 (two) times daily before a meal. 180 tablet 3   rosuvastatin (CRESTOR) 10 MG tablet TAKE 1 TABLET BY MOUTH DAILY AT  9 PM (AT BEDTIME)     sucralfate (CARAFATE) 1 g tablet Take 1 tablet (1 g total) by mouth 4 (four) times daily -  with meals and at bedtime. 270 tablet 1   TRULICITY 3 MG/0.5ML SOPN Inject 3 mg into the skin once a week. 6 mL 3   No current facility-administered medications for this visit.    Allergies as of 06/20/2023 - Review Complete 06/20/2023  Allergen Reaction Noted   Codeine Nausea And Vomiting and Anaphylaxis 12/18/2019   Carvedilol  09/16/2021    Hydrochlorothiazide Other (See Comments) 12/18/2019   Lisinopril Nausea And Vomiting 12/18/2019   Oxybutynin Other (See Comments) 12/18/2019   Penicillins Hives 01/05/2020    Family History  Problem Relation Age of Onset   Diabetes Mother    Thyroid disease Mother    Colon cancer Neg Hx     Social History   Socioeconomic History   Marital status: Divorced    Spouse name: Not  on file   Number of children: Not on file   Years of education: Not on file   Highest education level: Not on file  Occupational History   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 [place]  Tobacco Use   Smoking status: Never   Smokeless tobacco: Never  Vaping Use   Vaping status: Never Used  Substance and Sexual Activity   Alcohol use: Not Currently   Drug use: Not Currently   Sexual activity: Not on file  Other Topics Concern   Not on file  Social History Narrative   Not on file   Social Drivers of Health   Financial Resource Strain: Not on file  Food Insecurity: Not on file  Transportation Needs: Not on file  Physical Activity: Not on file  Stress: Not on file  Social Connections: Not on file  Intimate Partner Violence: Not on file     Review of Systems   Gen: Denies any fever, chills, fatigue, weight loss, lack of appetite.  CV: Denies chest pain, heart palpitations, peripheral edema, syncope.  Resp: Denies shortness of breath at rest or with exertion. Denies wheezing or cough.  GI: Denies dysphagia or odynophagia. Denies jaundice, hematemesis, fecal incontinence. GU : Denies urinary burning, urinary frequency, urinary hesitancy MS: Denies joint pain, muscle weakness, cramps, or limitation of movement.  Derm: Denies rash, itching, dry skin Psych: Denies depression, anxiety, memory loss, and confusion Heme: Denies bruising, bleeding, and enlarged lymph nodes.   Physical Exam   BP 114/67   Pulse 84   Temp 98.4 F (36.9 C)   Ht 5\' 3"  (1.6 m)   Wt 166 lb  3.2 oz (75.4 kg)   BMI 29.44 kg/m  General:   Alert and oriented. Pleasant and cooperative. Well-nourished and well-developed.  Head:  Normocephalic and atraumatic. Eyes:  Without icterus Abdomen:  +BS, soft, non-tender and non-distended. No HSM noted. No guarding or rebound. No masses appreciated.  Rectal:  Deferred  Msk:  Symmetrical without gross deformities. Normal posture. Extremities:  Without edema. Neurologic:  Alert and  oriented x4;  grossly normal neurologically. Skin:  Intact without significant lesions or rashes. Psych:  Alert and cooperative. Normal mood and affect.   Assessment   Stephanie Moreno is a 76 y.o. female presenting today with a history of    PLAN   *****    Gelene Mink, PhD, ANP-BC [facility]

## 2023-06-20 NOTE — Patient Instructions (Addendum)
Continue omeprazole once daily, 30 minutes before breakfast.  Continue Amitiza twice a day with food (to avoid nausea).  I sent in Xyzal, and allergy medication you can take at night.  We will see you in 6 months! Call if any worsening problems swallowing!  I hope you have a wonderful Christmas!  I enjoyed seeing you again today! I value our relationship and want to provide genuine, compassionate, and quality care. You may receive a survey regarding your visit with me, and I welcome your feedback! Thanks so much for taking the time to complete this. I look forward to seeing you again.      Gelene Mink, PhD, ANP-BC [facility]

## 2023-06-21 ENCOUNTER — Ambulatory Visit: Payer: Medicare HMO | Admitting: Urology

## 2023-06-22 ENCOUNTER — Encounter (HOSPITAL_COMMUNITY): Payer: Self-pay | Admitting: Emergency Medicine

## 2023-06-22 ENCOUNTER — Emergency Department (HOSPITAL_COMMUNITY)
Admission: EM | Admit: 2023-06-22 | Discharge: 2023-06-22 | Disposition: A | Discharge status: Home / Self Care | Payer: Medicare HMO | Attending: Emergency Medicine | Admitting: Emergency Medicine

## 2023-06-22 ENCOUNTER — Other Ambulatory Visit: Payer: Self-pay

## 2023-06-22 ENCOUNTER — Emergency Department (HOSPITAL_COMMUNITY): Payer: Medicare HMO

## 2023-06-22 ENCOUNTER — Ambulatory Visit: Payer: Medicare HMO | Admitting: Orthopedic Surgery

## 2023-06-22 VITALS — BP 148/78 | Ht 63.0 in | Wt 166.0 lb

## 2023-06-22 DIAGNOSIS — J45909 Unspecified asthma, uncomplicated: Secondary | ICD-10-CM | POA: Diagnosis not present

## 2023-06-22 DIAGNOSIS — I129 Hypertensive chronic kidney disease with stage 1 through stage 4 chronic kidney disease, or unspecified chronic kidney disease: Secondary | ICD-10-CM | POA: Diagnosis not present

## 2023-06-22 DIAGNOSIS — E1122 Type 2 diabetes mellitus with diabetic chronic kidney disease: Secondary | ICD-10-CM | POA: Diagnosis not present

## 2023-06-22 DIAGNOSIS — Z79899 Other long term (current) drug therapy: Secondary | ICD-10-CM | POA: Diagnosis not present

## 2023-06-22 DIAGNOSIS — M79604 Pain in right leg: Secondary | ICD-10-CM | POA: Diagnosis not present

## 2023-06-22 DIAGNOSIS — R600 Localized edema: Secondary | ICD-10-CM | POA: Diagnosis not present

## 2023-06-22 DIAGNOSIS — N189 Chronic kidney disease, unspecified: Secondary | ICD-10-CM | POA: Insufficient documentation

## 2023-06-22 DIAGNOSIS — I700 Atherosclerosis of aorta: Secondary | ICD-10-CM | POA: Diagnosis not present

## 2023-06-22 DIAGNOSIS — Z794 Long term (current) use of insulin: Secondary | ICD-10-CM | POA: Insufficient documentation

## 2023-06-22 DIAGNOSIS — M1711 Unilateral primary osteoarthritis, right knee: Secondary | ICD-10-CM | POA: Diagnosis not present

## 2023-06-22 DIAGNOSIS — G8929 Other chronic pain: Secondary | ICD-10-CM

## 2023-06-22 DIAGNOSIS — R110 Nausea: Secondary | ICD-10-CM | POA: Diagnosis not present

## 2023-06-22 DIAGNOSIS — R55 Syncope and collapse: Secondary | ICD-10-CM | POA: Insufficient documentation

## 2023-06-22 DIAGNOSIS — E876 Hypokalemia: Secondary | ICD-10-CM | POA: Insufficient documentation

## 2023-06-22 LAB — BASIC METABOLIC PANEL
Anion gap: 13 (ref 5–15)
BUN: 18 mg/dL (ref 8–23)
CO2: 19 mmol/L — ABNORMAL LOW (ref 22–32)
Calcium: 9.3 mg/dL (ref 8.9–10.3)
Chloride: 103 mmol/L (ref 98–111)
Creatinine, Ser: 1.47 mg/dL — ABNORMAL HIGH (ref 0.44–1.00)
GFR, Estimated: 37 mL/min — ABNORMAL LOW (ref >60)
Glucose, Bld: 198 mg/dL — ABNORMAL HIGH (ref 70–99)
Potassium: 3.1 mmol/L — ABNORMAL LOW (ref 3.5–5.1)
Sodium: 135 mmol/L (ref 135–145)

## 2023-06-22 LAB — CBC WITH DIFFERENTIAL/PLATELET
Abs Immature Granulocytes: 0.01 10*3/uL (ref 0.00–0.07)
Basophils Absolute: 0.1 10*3/uL (ref 0.0–0.1)
Basophils Relative: 1 %
Eosinophils Absolute: 0.1 10*3/uL (ref 0.0–0.5)
Eosinophils Relative: 2 %
HCT: 39.4 % (ref 36.0–46.0)
Hemoglobin: 12.3 g/dL (ref 12.0–15.0)
Immature Granulocytes: 0 %
Lymphocytes Relative: 39 %
Lymphs Abs: 2.9 10*3/uL (ref 0.7–4.0)
MCH: 27.2 pg (ref 26.0–34.0)
MCHC: 31.2 g/dL (ref 30.0–36.0)
MCV: 87 fL (ref 80.0–100.0)
Monocytes Absolute: 0.5 10*3/uL (ref 0.1–1.0)
Monocytes Relative: 6 %
Neutro Abs: 4 10*3/uL (ref 1.7–7.7)
Neutrophils Relative %: 52 %
Platelets: 282 10*3/uL (ref 150–400)
RBC: 4.53 MIL/uL (ref 3.87–5.11)
RDW: 14.1 % (ref 11.5–15.5)
WBC: 7.6 10*3/uL (ref 4.0–10.5)
nRBC: 0 % (ref 0.0–0.2)

## 2023-06-22 LAB — TROPONIN I (HIGH SENSITIVITY)
Troponin I (High Sensitivity): 3 ng/L (ref <18)
Troponin I (High Sensitivity): 3 ng/L (ref <18)

## 2023-06-22 LAB — CBG MONITORING, ED: Glucose-Capillary: 117 mg/dL — ABNORMAL HIGH (ref 70–99)

## 2023-06-22 MED ORDER — KETOROLAC TROMETHAMINE 30 MG/ML IJ SOLN
15.0000 mg | Freq: Once | INTRAMUSCULAR | Status: AC
  Administered 2023-06-22: 15 mg via INTRAVENOUS
  Filled 2023-06-22: qty 1

## 2023-06-22 MED ORDER — POTASSIUM CHLORIDE CRYS ER 20 MEQ PO TBCR
40.0000 meq | EXTENDED_RELEASE_TABLET | Freq: Once | ORAL | Status: DC
Start: 2023-06-22 — End: 2023-06-23

## 2023-06-22 MED ORDER — SODIUM CHLORIDE 0.9 % IV BOLUS: 1000.0000 mL | Freq: Once | INTRAVENOUS | Status: DC

## 2023-06-22 NOTE — Discharge Instructions (Addendum)
Rest make sure you are drinking plenty of fluids.  Plan to follow-up with your primary doctor if your symptoms of lightheadedness return, or return here for a recheck if your symptoms return.  I also recommend continuing to take the medications you are prescribed by Dr. Romeo Apple for your knee pain including adding back your meloxicam.  Your potassium level is a little low today, you have received a dose of potassium to help replace this.

## 2023-06-22 NOTE — ED Notes (Signed)
Pt taken to US

## 2023-06-22 NOTE — ED Notes (Signed)
Pt returned from [place] Pt off to CXR Pt stated she does not have a pacemaker Pt stated she has a dexcom for BGL

## 2023-06-22 NOTE — ED Notes (Signed)
Call daughter Aurther Loft when needs D/C.

## 2023-06-22 NOTE — ED Provider Notes (Signed)
[place] EMERGENCY DEPARTMENT AT [facility] Provider Note   CSN: [id number] Arrival date & time: 06/22/23  1037     History {Add pertinent medical, surgical, social history, OB history to HPI:1} Chief Complaint  Patient presents with   Leg Pain    Stephanie Moreno is a 77 y.o. female with a history including hypertension, chronic back pain, right knee pain associated with arthritis, GERD, chronic kidney disease, type 2 diabetes and asthma presenting for evaluation of a syncopal event.  She was in Dr. Mort Sawyers office this morning being evaluated for her chronic right knee pain, she endorses having increased pain after his exam, he left the room, she states she became lightheaded and passed out on the exam table.  This was confirmed upon reading Dr. Mort Sawyers note, they checked her blood pressure which was 80/50, her pulse was 55, she presents here for further evaluation secondary to the syncopal event.  She denies chest pain, shortness of breath, palpitations.  She does endorse feeling lightheaded and remembering severe pain in the right knee prior to her event.  She is currently symptom-free.  The history is provided by the patient.       Home Medications Prior to Admission medications   Medication Sig Start Date End Date Taking? Authorizing Provider  acetaminophen (TYLENOL) 325 MG tablet Take 650 mg by mouth every 4 (four) hours as needed.    [provider]  amLODipine (NORVASC) 10 MG tablet Take 5 mg by mouth daily.    [provider]  Azelastine HCl 0.15 % SOLN Place 1 spray into both nostrils daily. 08/19/21   [provider]  Continuous Blood Gluc Sensor (FREESTYLE LIBRE 2 SENSOR) MISC 2 each by Does not apply route every 14 (fourteen) days. 08/04/22   Dani Gobble, NP  Continuous Glucose Receiver (FREESTYLE LIBRE 2 READER) DEVI USE TO CHECK GLUCOSE AS DIRECTED 11/01/22   Roma Kayser, MD  Continuous Glucose Receiver (FREESTYLE  LIBRE 3 READER) DEVI Use the Reader to monitor your blood sugar readings daily 01/16/23   Dani Gobble, NP  Continuous Glucose Sensor (FREESTYLE LIBRE 3 SENSOR) MISC 1 Application by Does not apply route every 14 (fourteen) days. 01/16/23   Dani Gobble, NP  fluticasone (FLOVENT HFA) 220 MCG/ACT inhaler Inhale 1 puff into the lungs 2 (two) times daily. 07/24/22   [provider]  Fluticasone Furoate (ARNUITY ELLIPTA) 200 MCG/ACT AEPB Inhale 1 puff into the lungs daily.    [provider]  furosemide (LASIX) 20 MG tablet Take 1 tablet (20 mg total) by mouth every other day. Takes 40 mg every other day. On alternate days take 80 mg Patient taking differently: Take 20 mg by mouth 2 (two) times daily. 04/18/21   Johnson, Clanford L, MD  hydrALAZINE (APRESOLINE) 50 MG tablet Take 50 mg by mouth 2 (two) times daily.    [provider]  HYDROcodone-acetaminophen (NORCO) 5-325 MG tablet Take 1 tablet by mouth 2 (two) times daily as needed. 07/18/22   Tarry Kos, MD  insulin glargine (LANTUS SOLOSTAR) 100 UNIT/ML Solostar Pen INJECT 35 UNITS INTO THE SKIN AT BEDTIME. 05/21/23   Reardon, Freddi Starr, NP  Insulin Pen Needle 31G X 6 MM MISC 1 each by Does not apply route daily. 04/18/23   Dani Gobble, NP  levocetirizine (XYZAL) 5 MG tablet Take 1 tablet (5 mg total) by mouth every evening. As needed for allergies. 06/20/23   Gelene Mink, NP  lidocaine (LIDODERM) 5 % Place 1 patch onto the skin daily.    [provider]  losartan (COZAAR) 25 MG tablet Take 25 mg by mouth daily.    [provider]  lubiprostone (AMITIZA) 8 MCG capsule Take 1 capsule (8 mcg total) by mouth 2 (two) times daily with a meal. 06/20/23   Gelene Mink, NP  meloxicam (MOBIC) 7.5 MG tablet TAKE 1 TABLET BY MOUTH 2 TIMES DAILY. 04/02/23   Vickki Hearing, MD  mirabegron ER (MYRBETRIQ) 50 MG TB24 tablet TAKE 1 TABLET BY MOUTH DAILY AT  9 AM 12/01/22   McKenzie, Mardene Celeste, MD   omeprazole (PRILOSEC) 40 MG capsule Take 1 capsule (40 mg total) by mouth daily before breakfast. 06/20/23   Gelene Mink, NP  ondansetron (ZOFRAN) 4 MG tablet TAKE 1 TABLET BY MOUTH EVERY 8 HOURS AS NEEDED FOR NAUSEA AND VOMITING Patient taking differently: Take 4 mg by mouth every 8 (eight) hours as needed for nausea or vomiting. 11/28/22   Gelene Mink, NP  ondansetron (ZOFRAN) 4 MG tablet Take 1 tablet (4 mg total) by mouth every 8 (eight) hours as needed for nausea or vomiting. 02/21/23   Gelene Mink, NP  OVER THE COUNTER MEDICATION Prevagine one per day for memory.    [provider]  pantoprazole (PROTONIX) 40 MG tablet Take 1 tablet (40 mg total) by mouth 2 (two) times daily before a meal. 05/21/23   Gelene Mink, NP  rosuvastatin (CRESTOR) 10 MG tablet TAKE 1 TABLET BY MOUTH DAILY AT  9 PM (AT BEDTIME) 10/30/22   [provider]  sucralfate (CARAFATE) 1 g tablet Take 1 tablet (1 g total) by mouth 4 (four) times daily -  with meals and at bedtime. 05/07/23   Gelene Mink, NP  TRULICITY 3 MG/0.5ML SOPN Inject 3 mg into the skin once a week. 02/20/23   Dani Gobble, NP      Allergies    Codeine, Carvedilol, Hydrochlorothiazide, Lisinopril, Oxybutynin, and Penicillins    Review of Systems   Review of Systems  Constitutional:  Negative for fever.  HENT:  Negative for congestion and sore throat.   Eyes: Negative.   Respiratory:  Negative for chest tightness and shortness of breath.   Cardiovascular:  Negative for chest pain.  Gastrointestinal:  Negative for abdominal pain and nausea.  Genitourinary: Negative.   Musculoskeletal:  Positive for arthralgias. Negative for joint swelling and neck pain.  Skin: Negative.  Negative for rash and wound.  Neurological:  Positive for syncope. Negative for dizziness, weakness, light-headedness, numbness and headaches.  Psychiatric/Behavioral: Negative.      Physical Exam Updated Vital Signs BP 124/76   Pulse 90   Temp  98.5 F (36.9 C) (Oral)   Resp 20   Ht 5\' 3"  (1.6 m)   Wt 75.3 kg   SpO2 100%   BMI 29.41 kg/m  Physical Exam Vitals and nursing note reviewed.  Constitutional:      Appearance: She is well-developed.  HENT:     Head: Normocephalic and atraumatic.     Mouth/Throat:     Mouth: Mucous membranes are moist.  Eyes:     Conjunctiva/sclera: Conjunctivae normal.  Cardiovascular:     Rate and Rhythm: Normal rate and regular rhythm.     Heart sounds: Normal heart sounds.  Pulmonary:     Effort: Pulmonary effort is normal.     Breath sounds: Normal breath sounds. No wheezing.  Abdominal:  General: Bowel sounds are normal.     Palpations: Abdomen is soft.     Tenderness: There is no abdominal tenderness.  Musculoskeletal:     Cervical back: Normal range of motion.  Skin:    General: Skin is warm and dry.  Neurological:     Mental Status: She is alert.     ED Results / Procedures / Treatments   Labs (all labs ordered are listed, but only abnormal results are displayed) Labs Reviewed  BASIC METABOLIC PANEL - Abnormal; Notable for the following components:      Result Value   Potassium 3.1 (*)    CO2 19 (*)    Glucose, Bld 198 (*)    Creatinine, Ser 1.47 (*)    GFR, Estimated 37 (*)    All other components within normal limits  CBG MONITORING, ED - Abnormal; Notable for the following components:   Glucose-Capillary 117 (*)    All other components within normal limits  CBC WITH DIFFERENTIAL/PLATELET  TROPONIN I (HIGH SENSITIVITY)  TROPONIN I (HIGH SENSITIVITY)    EKG None  Radiology DG Chest Portable 1 View Result Date: 06/22/2023 CLINICAL DATA:  Syncopal episode. EXAM: PORTABLE CHEST 1 VIEW COMPARISON:  03/16/2022 FINDINGS: Unchanged cardiac silhouette and mediastinal contours with atherosclerotic plaque within thoracic aorta. No focal parenchymal opacities. No pleural effusion or pneumothorax no evidence of edema. No acute osseous abnormalities. Stigmata of dish  within the thoracic spine. IMPRESSION: No acute cardiopulmonary disease. Electronically Signed   By: Simonne Come M.D.   On: 06/22/2023 14:53   US Venous Img Lower Right (DVT Study) Result Date: 06/22/2023 CLINICAL DATA:  Right lower extremity pain and edema for the past 3 days. History of breast cancer. Evaluate for DVT. EXAM: RIGHT LOWER EXTREMITY VENOUS DOPPLER ULTRASOUND TECHNIQUE: Gray-scale sonography with graded compression, as well as color Doppler and duplex ultrasound were performed to evaluate the lower extremity deep venous systems from the level of the common femoral vein and including the common femoral, femoral, profunda femoral, popliteal and calf veins including the posterior tibial, peroneal and gastrocnemius veins when visible. The superficial great saphenous vein was also interrogated. Spectral Doppler was utilized to evaluate flow at rest and with distal augmentation maneuvers in the common femoral, femoral and popliteal veins. COMPARISON:  None Available. FINDINGS: Contralateral Common Femoral Vein: Respiratory phasicity is normal and symmetric with the symptomatic side. No evidence of thrombus. Normal compressibility. Common Femoral Vein: No evidence of thrombus. Normal compressibility, respiratory phasicity and response to augmentation. Saphenofemoral Junction: No evidence of thrombus. Normal compressibility and flow on color Doppler imaging. Profunda Femoral Vein: No evidence of thrombus. Normal compressibility and flow on color Doppler imaging. Femoral Vein: No evidence of thrombus. Normal compressibility, respiratory phasicity and response to augmentation. Popliteal Vein: No evidence of thrombus. Normal compressibility, respiratory phasicity and response to augmentation. Calf Veins: No evidence of thrombus. Normal compressibility and flow on color Doppler imaging. Superficial Great Saphenous Vein: No evidence of thrombus. Normal compressibility. Other Findings:  None. IMPRESSION: No  evidence of DVT within the right lower extremity. Electronically Signed   By: Simonne Come M.D.   On: 06/22/2023 14:52    Procedures Procedures  {Document cardiac monitor, telemetry assessment procedure when appropriate:1}  Medications Ordered in ED Medications - No data to display  ED Course/ Medical Decision Making/ A&P   {   Click here for ABCD2, HEART and other calculatorsREFRESH Note before signing :1}  Medical Decision Making Risk Prescription drug management.     {Document critical care time when appropriate:1} {Document review of labs and clinical decision tools ie heart score, Chads2Vasc2 etc:1}  {Document your independent review of radiology images, and any outside records:1} {Document your discussion with family members, caretakers, and with consultants:1} {Document social determinants of health affecting pt's care:1} {Document your decision making why or why not admission, treatments were needed:1} Final Clinical Impression(s) / ED Diagnoses Final diagnoses:  None    Rx / DC Orders ED Discharge Orders     None

## 2023-06-22 NOTE — ED Provider Triage Note (Signed)
Emergency Medicine Provider Triage Evaluation Note  Stephanie Moreno , a 77 y.o. female  was evaluated in triage.  Pt complains of syncope in the orthopedic office this morning, was being seen for right leg pain has been going on for months, states that after left the room and she got dizzy and syncopized, per their notes she was hypotensive and diaphoretic.  Sent further evaluation.  Denies chest pain or shortness of breath, no history of VTE.Marland Kitchen  Review of Systems  Positive: R leg pain, syncope Negative: Chest pain, sob  Physical Exam  BP 128/74   Pulse 83   Temp 98.5 F (36.9 C) (Oral)   Resp 18   Ht 5\' 3"  (1.6 m)   Wt 75.3 kg   SpO2 99%   BMI 29.41 kg/m  Gen:   Awake, no distress   Resp:  Normal effort  MSK:   Moves extremities without difficulty  Other:    Medical Decision Making  Medically screening exam initiated at 12:40 PM.  Appropriate orders placed.  [name] was informed that the remainder of the evaluation will be completed by another provider, this initial triage assessment does not replace that evaluation, and the importance of remaining in the ED until their evaluation is complete.     Ma Rings, [place] 06/22/23 1241

## 2023-06-22 NOTE — ED Triage Notes (Signed)
Pt c/o right leg/knee pain, was at orthopedic office and had low bp. No other complaints at this time.

## 2023-06-22 NOTE — Progress Notes (Signed)
Chief Complaint  Patient presents with   Follow-up    Recheck on right knee   Stephanie Moreno came into check on her right knee she is having a lot of pain  I looked at her x-rays today and from before and her MRI she has a mild case of arthritis.  Her symptoms do not match her imaging studies  During the course of her visit we found her passed out diaphoretic blood pressure 80/50 pulse 55 and we called EMS.  She went to the hospital for further evaluation and management  Obviously this will preclude any surgical intervention and based on her prior lumbar fusion I am concerned that her knee pain may be referred pain from her back

## 2023-06-22 NOTE — ED Notes (Signed)
Vital reassessed Pain 7/10 12 lead completed  BGL checked Pt moved back to lobby

## 2023-06-29 DIAGNOSIS — J069 Acute upper respiratory infection, unspecified: Secondary | ICD-10-CM | POA: Diagnosis not present

## 2023-06-29 DIAGNOSIS — M25561 Pain in right knee: Secondary | ICD-10-CM | POA: Diagnosis not present

## 2023-06-29 DIAGNOSIS — Z8679 Personal history of other diseases of the circulatory system: Secondary | ICD-10-CM | POA: Diagnosis not present

## 2023-06-29 DIAGNOSIS — M25562 Pain in left knee: Secondary | ICD-10-CM | POA: Diagnosis not present

## 2023-07-02 ENCOUNTER — Ambulatory Visit (INDEPENDENT_AMBULATORY_CARE_PROVIDER_SITE_OTHER): Payer: Medicare HMO | Admitting: Orthopedic Surgery

## 2023-07-02 DIAGNOSIS — Z981 Arthrodesis status: Secondary | ICD-10-CM | POA: Diagnosis not present

## 2023-07-02 DIAGNOSIS — G8929 Other chronic pain: Secondary | ICD-10-CM | POA: Diagnosis not present

## 2023-07-02 DIAGNOSIS — M25562 Pain in left knee: Secondary | ICD-10-CM | POA: Diagnosis not present

## 2023-07-02 DIAGNOSIS — M25561 Pain in right knee: Secondary | ICD-10-CM

## 2023-07-02 DIAGNOSIS — M1712 Unilateral primary osteoarthritis, left knee: Secondary | ICD-10-CM

## 2023-07-02 DIAGNOSIS — M1711 Unilateral primary osteoarthritis, right knee: Secondary | ICD-10-CM

## 2023-07-02 MED ORDER — METHYLPREDNISOLONE ACETATE 40 MG/ML IJ SUSP
40.0000 mg | Freq: Once | INTRAMUSCULAR | Status: AC
  Administered 2023-07-02: 40 mg via INTRA_ARTICULAR

## 2023-07-02 NOTE — Progress Notes (Signed)
FOLLOW-UP OFFICE VISIT   Patient: Stephanie Moreno           Date of Birth: January 09, 1946           MRN: [id number] Visit Date: 07/02/2023 Requested by: Benita Stabile, MD [address] [place],  [place] [address] PCP: Benita Stabile, MD    Encounter Diagnoses  Name Primary?   S/P lumbar fusion    Chronic pain of right knee    Primary osteoarthritis of right knee Yes   Chronic pain of left knee    Primary osteoarthritis of left knee     Chief Complaint  Patient presents with   Knee Pain    Pain right knee      77 year old female was recently in our office had a diabetic hypoglycemic episode responded well to sugar placement  Complains of right knee pain  Status post lumbar fusion  X-rays and MRI do not warrant surgical intervention in her right knee  Today also complaining of some left knee pain  Exam findings include lower lumbar spine midline tenderness right sided tenderness no buttock tenderness  She has right thigh pain with normal hip exam and then some pain along the medial shin down to the level of the ankle  Recommend bilateral knee injections and reevaluation by neurosurgery/spine Ortho as her MRI and knee findings on x-ray do not warrant any surgical intervention  ASSESSMENT AND PLAN Procedure note for bilateral knee injections  Procedure note left knee injection verbal consent was obtained to inject left knee joint  Timeout was completed to confirm the site of injection  The medications used were 40 mg depomedrol and 3 cc of 1% lidocaine  Anesthesia was provided by ethyl chloride and the skin was prepped with alcohol.  After cleaning the skin with alcohol a 20-gauge needle was used to inject the left knee joint. There were no complications. A sterile bandage was applied.   Procedure note right knee injection verbal consent was obtained to inject right knee joint  Timeout was completed to confirm the site of injection  The medications used were 40 mg  depomedrol and 3 cc of 1% lidocaine  Anesthesia was provided by ethyl chloride and the skin was prepped with alcohol.  After cleaning the skin with alcohol a 20-gauge needle was used to inject the right knee joint. There were no complications. A sterile bandage was applied.

## 2023-07-02 NOTE — Addendum Note (Signed)
Addended by: Michaele Offer on: 07/02/2023 11:49 AM   Modules accepted: Orders

## 2023-07-02 NOTE — Patient Instructions (Signed)
Recommend follow up with surgeon who did the back surgery

## 2023-07-05 DIAGNOSIS — R809 Proteinuria, unspecified: Secondary | ICD-10-CM | POA: Diagnosis not present

## 2023-07-05 DIAGNOSIS — E1122 Type 2 diabetes mellitus with diabetic chronic kidney disease: Secondary | ICD-10-CM | POA: Diagnosis not present

## 2023-07-06 ENCOUNTER — Other Ambulatory Visit: Payer: Self-pay | Admitting: Orthopedic Surgery

## 2023-07-06 ENCOUNTER — Other Ambulatory Visit: Payer: Self-pay | Admitting: Nurse Practitioner

## 2023-07-06 ENCOUNTER — Other Ambulatory Visit: Payer: Self-pay | Admitting: Urology

## 2023-07-06 DIAGNOSIS — Z7985 Long-term (current) use of injectable non-insulin antidiabetic drugs: Secondary | ICD-10-CM

## 2023-07-06 DIAGNOSIS — Z794 Long term (current) use of insulin: Secondary | ICD-10-CM

## 2023-07-06 DIAGNOSIS — N1832 Chronic kidney disease, stage 3b: Secondary | ICD-10-CM

## 2023-07-06 DIAGNOSIS — G8929 Other chronic pain: Secondary | ICD-10-CM

## 2023-07-06 DIAGNOSIS — M25561 Pain in right knee: Secondary | ICD-10-CM

## 2023-07-06 DIAGNOSIS — N3941 Urge incontinence: Secondary | ICD-10-CM

## 2023-07-06 DIAGNOSIS — Z7984 Long term (current) use of oral hypoglycemic drugs: Secondary | ICD-10-CM

## 2023-07-09 ENCOUNTER — Telehealth: Payer: Self-pay

## 2023-07-09 ENCOUNTER — Other Ambulatory Visit: Payer: Self-pay | Admitting: Orthopedic Surgery

## 2023-07-09 DIAGNOSIS — G8929 Other chronic pain: Secondary | ICD-10-CM

## 2023-07-09 DIAGNOSIS — N3941 Urge incontinence: Secondary | ICD-10-CM

## 2023-07-09 DIAGNOSIS — M25561 Pain in right knee: Secondary | ICD-10-CM

## 2023-07-09 MED ORDER — MIRABEGRON ER 50 MG PO TB24: ORAL_TABLET | ORAL | 3 refills | Status: DC

## 2023-07-09 MED ORDER — MELOXICAM 7.5 MG PO TABS: 7.5000 mg | ORAL_TABLET | Freq: Two times a day (BID) | ORAL | 10 refills | Status: DC

## 2023-07-09 NOTE — Telephone Encounter (Signed)
 Pt called and notified she confirmed pharmacy change

## 2023-07-10 ENCOUNTER — Telehealth: Payer: Self-pay

## 2023-07-10 ENCOUNTER — Other Ambulatory Visit: Payer: Self-pay | Admitting: Gastroenterology

## 2023-07-10 NOTE — Telephone Encounter (Signed)
 Faxed the fill out documentation's back to [facility] @ [phone]. Confirmation has returned

## 2023-07-10 NOTE — Telephone Encounter (Signed)
 Documentation from [facility] requiring your attention regarding refills and Rx's. Your desk yellow folder

## 2023-07-12 ENCOUNTER — Other Ambulatory Visit: Payer: Self-pay | Admitting: *Deleted

## 2023-07-12 DIAGNOSIS — Z794 Long term (current) use of insulin: Secondary | ICD-10-CM

## 2023-07-12 DIAGNOSIS — E1122 Type 2 diabetes mellitus with diabetic chronic kidney disease: Secondary | ICD-10-CM | POA: Diagnosis not present

## 2023-07-12 DIAGNOSIS — R809 Proteinuria, unspecified: Secondary | ICD-10-CM | POA: Diagnosis not present

## 2023-07-12 DIAGNOSIS — I129 Hypertensive chronic kidney disease with stage 1 through stage 4 chronic kidney disease, or unspecified chronic kidney disease: Secondary | ICD-10-CM | POA: Diagnosis not present

## 2023-07-12 DIAGNOSIS — E1129 Type 2 diabetes mellitus with other diabetic kidney complication: Secondary | ICD-10-CM | POA: Diagnosis not present

## 2023-07-12 DIAGNOSIS — Z7984 Long term (current) use of oral hypoglycemic drugs: Secondary | ICD-10-CM

## 2023-07-12 MED ORDER — FREESTYLE LIBRE 3 PLUS SENSOR MISC: 6 refills | Status: AC

## 2023-07-20 ENCOUNTER — Encounter: Payer: Self-pay | Admitting: Urology

## 2023-07-20 ENCOUNTER — Ambulatory Visit (INDEPENDENT_AMBULATORY_CARE_PROVIDER_SITE_OTHER): Payer: 59 | Admitting: Urology

## 2023-07-20 VITALS — BP 129/75 | HR 81 | Temp 98.6°F

## 2023-07-20 DIAGNOSIS — Z87898 Personal history of other specified conditions: Secondary | ICD-10-CM | POA: Diagnosis not present

## 2023-07-20 DIAGNOSIS — N3941 Urge incontinence: Secondary | ICD-10-CM | POA: Diagnosis not present

## 2023-07-20 DIAGNOSIS — Z87440 Personal history of urinary (tract) infections: Secondary | ICD-10-CM

## 2023-07-20 LAB — MICROSCOPIC EXAMINATION

## 2023-07-20 LAB — URINALYSIS, ROUTINE W REFLEX MICROSCOPIC
Bilirubin, UA: NEGATIVE
Glucose, UA: NEGATIVE
Ketones, UA: NEGATIVE
Nitrite, UA: NEGATIVE
RBC, UA: NEGATIVE
Specific Gravity, UA: 1.01 (ref 1.005–1.030)
Urobilinogen, Ur: 0.2 mg/dL (ref 0.2–1.0)
pH, UA: 6 (ref 5.0–7.5)

## 2023-07-20 LAB — BLADDER SCAN AMB NON-IMAGING: Scan Result: 8

## 2023-07-20 NOTE — Progress Notes (Signed)
Name: Stephanie Moreno DOB: [birth year] MRN: [id number]  History of Present Illness: Stephanie Moreno is a 78 y.o. female who presents today for follow up visit at [facility] .  - GU history: 1. OAB with urinary frequency, nocturia, urgency, and urge incontinence. - Likely exacerbated by T2DM, glucosuria (take [place]), OSA.  At last visit with Dr. Ronne Binning on 12/01/2022: The plan was: - Continue Myrbetriq 50 mg daily. - "The patient is instructed to contact Dr. Lucio Edward office to discuss alternative to farxiga since this medication is exacerbating her urinary isssues."  Today: She reports concern for UTI due to malodorous urine. Denies fever, dysuria, gross hematuria, acutely increased urinary urgency/frequency, straining to void, or sensations of incomplete emptying. She reports that overall the Myrbetriq continues to be helpful and that her OAB symptoms are well managed.  Fall Screening: Do you usually have a device to assist in your mobility? Yes - walker   Medications: Current Outpatient Medications  Medication Sig Dispense Refill   acetaminophen (TYLENOL) 325 MG tablet Take 650 mg by mouth every 4 (four) hours as needed.     amLODipine (NORVASC) 10 MG tablet Take 5 mg by mouth daily.     Azelastine HCl 0.15 % SOLN Place 1 spray into both nostrils daily.     Continuous Blood Gluc Sensor (FREESTYLE LIBRE 2 SENSOR) MISC 2 each by Does not apply route every 14 (fourteen) days. 2 each 2   Continuous Glucose Receiver (FREESTYLE LIBRE 2 READER) DEVI USE TO CHECK GLUCOSE AS DIRECTED 1 each 0   Continuous Glucose Receiver (FREESTYLE LIBRE 3 READER) DEVI Use the Reader to monitor your blood sugar readings daily 1 each 0   Continuous Glucose Sensor (FREESTYLE LIBRE 14 DAY SENSOR) MISC APPLY NEW SENSOR EVERY 14 DAYS *NEW PRESCRIPTION REQUEST* 6 each 1   Continuous Glucose Sensor (FREESTYLE LIBRE 3 PLUS SENSOR) MISC Change sensor every 15 days. 2 each 6   Continuous Glucose Sensor  (FREESTYLE LIBRE 3 SENSOR) MISC 1 Application by Does not apply route every 14 (fourteen) days. 2 each 2   Dulaglutide (TRULICITY) 3 MG/0.5ML SOAJ INJECT 3MG  INTO THE SKIN 1 TIME PER WEEK *NEW PRESCRIPTION REQUEST* 6 mL 1   fluticasone (FLOVENT HFA) 220 MCG/ACT inhaler Inhale 1 puff into the lungs 2 (two) times daily.     Fluticasone Furoate (ARNUITY ELLIPTA) 200 MCG/ACT AEPB Inhale 1 puff into the lungs daily.     furosemide (LASIX) 20 MG tablet Take 1 tablet (20 mg total) by mouth every other day. Takes 40 mg every other day. On alternate days take 80 mg (Patient taking differently: Take 20 mg by mouth 2 (two) times daily.)     hydrALAZINE (APRESOLINE) 50 MG tablet Take 50 mg by mouth 2 (two) times daily.     HYDROcodone-acetaminophen (NORCO) 5-325 MG tablet Take 1 tablet by mouth 2 (two) times daily as needed. 10 tablet 0   insulin glargine (LANTUS SOLOSTAR) 100 UNIT/ML Solostar Pen INJECT 35 UNITS INTO THE SKIN AT BEDTIME *NEW PRESCRIPTION REQUEST* 30 mL 1   Insulin Pen Needle (BD PEN NEEDLE MICRO U/F) 32G X 6 MM MISC USE AS DIRECTED ONCE DAILY *NEW PRESCRIPTION REQUEST* 100 each 1   Insulin Pen Needle 31G X 6 MM MISC 1 each by Does not apply route daily. 100 each 3   levocetirizine (XYZAL) 5 MG tablet TAKE 1 TABLET BY MOUTH EVERY EVENING AS NEEDED FOR ALLERGIES 30 tablet 10   lidocaine (LIDODERM) 5 % Place 1 patch  onto the skin daily.     losartan (COZAAR) 25 MG tablet Take 25 mg by mouth daily.     lubiprostone (AMITIZA) 8 MCG capsule Take 1 capsule (8 mcg total) by mouth 2 (two) times daily with a meal. 180 capsule 3   meloxicam (MOBIC) 7.5 MG tablet Take 1 tablet (7.5 mg total) by mouth 2 (two) times daily. 60 tablet 10   mirabegron ER (MYRBETRIQ) 50 MG TB24 tablet TAKE 1 TABLET BY MOUTH EVERY DAY AT 9 AM *NEW PRESCRIPTION REQUEST* 30 tablet 10   mirabegron ER (MYRBETRIQ) 50 MG TB24 tablet TAKE 1 TABLET BY MOUTH DAILY AT  9 AM 90 tablet 3   omeprazole (PRILOSEC) 40 MG capsule Take 1 capsule  (40 mg total) by mouth daily before breakfast. 90 capsule 3   ondansetron (ZOFRAN) 4 MG tablet TAKE 1 TABLET BY MOUTH EVERY 8 HOURS AS NEEDED FOR NAUSEA AND VOMITING (Patient taking differently: Take 4 mg by mouth every 8 (eight) hours as needed for nausea or vomiting.) 30 tablet 1   ondansetron (ZOFRAN) 4 MG tablet Take 1 tablet (4 mg total) by mouth every 8 (eight) hours as needed for nausea or vomiting. 30 tablet 1   OVER THE COUNTER MEDICATION Prevagine one per day for memory.     pantoprazole (PROTONIX) 40 MG tablet Take 1 tablet (40 mg total) by mouth 2 (two) times daily before a meal. 180 tablet 3   rosuvastatin (CRESTOR) 10 MG tablet TAKE 1 TABLET BY MOUTH DAILY AT  9 PM (AT BEDTIME)     sucralfate (CARAFATE) 1 g tablet Take 1 tablet (1 g total) by mouth 4 (four) times daily -  with meals and at bedtime. 270 tablet 1   No current facility-administered medications for this visit.    Allergies: Allergies  Allergen Reactions   Codeine Nausea And Vomiting and Anaphylaxis   Carvedilol     Cold sweats, diarrhea, severe hot flashes   Hydrochlorothiazide Other (See Comments)    Other reaction(s): stomach upset   Lisinopril Nausea And Vomiting    Other reaction(s): cough   Oxybutynin Other (See Comments)   Penicillins Hives    Past Medical History:  Diagnosis Date   Acid reflux disease    Anemia    Anxiety    Asthma    Breast cancer (HCC)    left side   Chronic back pain    Chronic kidney disease    Depression    Diabetes mellitus without complication (HCC)    Diverticulitis    Hypertension    Lumbosacral spondylosis    Per previous PCP records   Sleep apnea    Thyroid disease    Past Surgical History:  Procedure Laterality Date   ABDOMINAL HYSTERECTOMY     BACK SURGERY     BALLOON DILATION N/A 04/24/2022   Procedure: BALLOON DILATION;  Surgeon: Lanelle Bal, DO;  Location: [facility];  Service: Endoscopy;  Laterality: N/A;   BIOPSY  02/19/2020   Procedure:  BIOPSY;  Surgeon: Lanelle Bal, DO;  Location: [facility];  Service: Endoscopy;;   BIOPSY  04/24/2022   Procedure: BIOPSY;  Surgeon: Lanelle Bal, DO;  Location: [facility];  Service: Endoscopy;;   BREAST LUMPECTOMY Left 2003   BREAST SURGERY     COLONOSCOPY WITH PROPOFOL N/A 02/19/2020   non-bleeding internal hemorrhoids, many small-mouthed diverticula in entire colon.   ESOPHAGOGASTRODUODENOSCOPY (EGD) WITH PROPOFOL N/A 02/19/2020    benign-appearing esophageal stenosis s/p  dilation, gastritis s/p biopsy, normal duodenum. Negative H.pylori, negative duodenal biopsy.    ESOPHAGOGASTRODUODENOSCOPY (EGD) WITH PROPOFOL N/A 04/24/2022   small hiatal hernia, mild Schatzki ring, s/p dilation. Gastritis s/p biopsy. Negative H.pylori.   GIVENS CAPSULE STUDY N/A 06/14/2020   Procedure: GIVENS CAPSULE STUDY;  Surgeon: Lanelle Bal, DO;  Location: [facility];  Service: Endoscopy;  Laterality: N/A;  7:30am   THYROIDECTOMY     tummy tuck     at time of breast surgery 2003   Family History  Problem Relation Age of Onset   Diabetes Mother    Thyroid disease Mother    Colon cancer Neg Hx    Social History   Socioeconomic History   Marital status: Divorced    Spouse name: Not on file   Number of children: Not on file   Years of education: Not on file   Highest education level: Not on file  Occupational History   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 [place]  Tobacco Use   Smoking status: Never   Smokeless tobacco: Never  Vaping Use   Vaping status: Never Used  Substance and Sexual Activity   Alcohol use: Not Currently   Drug use: Not Currently   Sexual activity: Not on file  Other Topics Concern   Not on file  Social History Narrative   Not on file   Social Drivers of Health   Financial Resource Strain: Not on file  Food Insecurity: Not on file  Transportation Needs: Not on file  Physical Activity: Not on file  Stress: Not on file   Social Connections: Not on file  Intimate Partner Violence: Not on file    Review of Systems Constitutional: Patient denies any unintentional weight loss or change in strength lntegumentary: Patient denies any rashes or pruritus Cardiovascular: Patient denies chest pain or syncope Respiratory: Patient denies shortness of breath Gastrointestinal: Patient denies constipation  Musculoskeletal: Patient denies muscle cramps or weakness Neurologic: Patient denies convulsions or seizures Allergic/Immunologic: Patient denies recent allergic reaction(s) Hematologic/Lymphatic: Patient denies bleeding tendencies Endocrine: Patient denies heat/cold intolerance  GU: As per HPI.  OBJECTIVE Vitals:   07/20/23 1106 07/20/23 1124  BP: (!) 171/83 129/75  Pulse: 94 81  Temp: 98.6 F (37 C)    There is no height or weight on file to calculate BMI.  Physical Examination Constitutional: No obvious distress; patient is non-toxic appearing  Cardiovascular: No visible lower extremity edema.  Respiratory: The patient does not have audible wheezing/stridor; respirations do not appear labored  Gastrointestinal: Abdomen non-distended Musculoskeletal: Normal ROM of UEs  Skin: No obvious rashes/open sores  Neurologic: CN 2-12 grossly intact Psychiatric: Answered questions appropriately with normal affect  Hematologic/Lymphatic/Immunologic: No obvious bruises or sites of spontaneous bleeding  Urine microscopy: few bacteria, otherwise unremarkable PVR: 8 ml  ASSESSMENT Urge incontinence - Plan: Urinalysis, Routine w reflex microscopic, BLADDER SCAN AMB NON-IMAGING  History of UTI - Plan: Urinalysis, Routine w reflex microscopic, BLADDER SCAN AMB NON-IMAGING  History of urinary retention - Plan: Urinalysis, Routine w reflex microscopic, BLADDER SCAN AMB NON-IMAGING  OAB well managed with Myrbetriq 50 mg daily. She elected to continue that and has refills available.  No significant evidence to  suggest acute UTI. We discussed that urine color, clarity, and odor are not considered to be clinical indicators of UTI  We agreed to plan for follow up in 1 year or sooner if needed. Pt verbalized understanding and agreement. All questions were answered.  PLAN Advised the following: 1. Continue Myrbetriq 50 mg daily. 2. Return in about 1 year (around 07/19/2024) for UA, PVR, & f/u with Evette Georges NP.  Orders Placed This Encounter  Procedures   Urinalysis, Routine w reflex microscopic   BLADDER SCAN AMB NON-IMAGING    It has been explained that the patient is to follow regularly with their PCP in addition to all other providers involved in their care and to follow instructions provided by these respective offices. Patient advised to contact urology clinic if any urologic-pertaining questions, concerns, new symptoms or problems arise in the interim period.  Patient Instructions      Overactive bladder (OAB) overview for patients:  Symptoms may include: urinary urgency ("gotta go" feeling) urinary frequency (voiding >8 times per day) night time urination (nocturia) urge incontinence of urine (UUI)  While we do not know the exact etiology of OAB, several treatment options exist including:  Behavioral therapy: Reducing fluid intake Decreasing bladder stimulants (such as caffeine) and irritants (such as acidic food, spicy foods, alcohol) Urge suppression strategies Bladder retraining via timed voiding  Pelvic floor physical therapy  Medication(s) - can use one or both of the drug classes below. Anticholinergic / antimuscarinic medications:  Mechanism of action: Activate M3 receptors to reduce detrusor stimulation and increase bladder capacity   (parasympathetic nervous system). Effect: Relaxes the bladder to decrease overactivity, increase bladder storage capacity, and increase time between voids. Onset: Slow acting (may take 8-12 weeks to determine efficacy). Medications  include: Vesicare (Solifenacin), Ditropan (Oxybutynin), Detrol (Tolterodine), Toviaz (Fesoterodine), Sanctura (Trospium), Urispas (Flavoxate), Enablex (Darifenacin), Bentyl (Dicyclomine), Levsin (Hyoscyamine ). Potential side effects include but are not limited to: Dry eyes, dry mouth, constipation, cognitive impairment, dementia risk with long term use, and urinary retention/ incomplete bladder emptying. Insurance companies generally prefer for patients to try 1-2 anticholinergic / antimuscarinic medications first due to low cost. Some exceptions are made based on patient-specific comorbidities / risk factors. Beta-3 agonist medications: Mechanism of action: Stimulates selective B3 adrenergic receptors to cause smooth muscle bladder relaxation (sympathetic nervous system). Effect: Relaxes the bladder to decrease overactivity, increase bladder storage capacity, and increase time between voids. Onset: Slow acting (may take 8-12 weeks to determine efficacy). Medications include: Myrbetriq (Mirabegron) and Vibegron Leslye Peer). Potential side effects include but are not limited to: urinary retention / incomplete bladder emptying and elevated blood pressure (more likely to occur in individuals with pre-existing uncontrolled hypertension). These medications tend to be more expensive than the anticholinergic / antimuscarinic medications.   For patients with refractory OAB (if the above treatment options have been unsuccessful): Posterior tibial nerve stimulation (PTNS). Small acupuncture-type needle inserted near ankle with electric current to stimulate bladder via posterior tibial nerve pathway. Initially requires 12 weekly in-office treatments lasting 30 minutes each; followed by monthly in-office treatments lasting 30 minutes each for 1 year.  Bladder Botox injections. How it is done: Typically done via in-office cystoscopy; sometimes done in the OR depending on the situation. The bladder is numbed with  lidocaine instilled via a catheter. Then the urologist injects Botox into the bladder muscle wall in about 20 locations. Causes local paralysis of the bladder muscle at the injection sites to reduce bladder muscle overactivity / spasms. The effect lasts for approximately 6 months and cannot be reversed once performed. Risks may included but are not limited to: infection, incomplete bladder emptying/ urinary retention, short term need for self-catheterization or indwelling catheter, and need for repeat therapy. There is a 5-12% chance of needing  to catheterize with Botox - that usually resolves in a few months as the Botox wears off. Typically Botox injections would need to be repeated every 3-12 months since this is not a permanent therapy.  Sacral neuromodulation trial (Medtronic lnterStim or Axonics implant). Sacral neuromodulation is FDA-approved for uncontrolled urinary urgency, urinary frequency, urinary urge incontinence, non-obstructive urinary retention, or fecal incontinence. It is not FDA-approved as a treatment for pain. The goal of this therapy is at least a 50% improvement in symptoms. It is NOT realistic to expect a 100% cure. This is a a 2-step outpatient procedure. After a successful test period, a permanent wire and generator are placed in the OR. We discussed the risk of infection. We reviewed the fact that about 30% of patients fail the test phase and are not candidates for permanent generator placement. During the 1-2 week trial phase, symptoms are documented by the patient to determine response. If patient gets at least a 50% improvement in symptoms, they may then proceed with Step 2. Step 1: Trial lead placement. Per physician discretion, may done one of two ways: Percutaneous nerve evaluation (PNE) in the [facility] urology office. Performed by urologist under local anesthesia (numbing the area with lidocaine) using a spinal needle for placement of test wire, which usually stays in place  for 5-7 days to determine therapy response. Test lead placement in OR under anesthesia. Usually stays in place 2 weeks to determine therapy response. > Step 2: Permanent implantation of sacral neuromodulation device, which is performed in the OR.  Sacral neuromodulation implants: All are conditionally MRI safe. Manufacturer: Medtronic Website: [employer] therapy/right-for-you.html Options: lnterStim X: Non-rechargeable. The battery lasts 10 years on average. lnterStim Micro: Rechargeable. The battery lasts 15 years on average and must be charged routinely. Approximately 50% smaller implant than lnterStim X implant.  Manufacturer: Axonics Website: Findrealrelief.axonics.com Options: Non-rechargeable (Axonics F15): The battery lasts 15 years on average. Rechargeable (Axonics R20): The battery lasts 20 years on average and must be charged in office for about 1 hour every 6-10 months on average. Approximately 50% smaller implant than Axonics non-rechargeable implant.  Note: Generally the rechargeable devices are only advised for very small or thin patients who may not have sufficient adipose tissue to comfortably overlay the implanted device.  Suprapubic catheter (SP tube) placement. Only done in severely refractory OAB when all other options have failed or are not a viable treatment choice depending on patient factors. Involves placement of a catheter through the lower abdomen into the bladder to continuously drain the bladder into an external collection bag, which patient can then empty at their convenience every few hours. Done via an outpatient surgical procedure in the OR under anesthesia. Risks may included but are not limited to: surgical site pain, infections, skin irritation / breakdown, chronic bacteriuria, symptomatic UTls. The SP tube must stay in place continuously. This is a reversible procedure  however - the insertion site will close if catheter is removed for more than a few hours. The SP tube must be exchanged routinely every 4 weeks to prevent the catheter from becoming clogged with sediment. SP tube exchanges are typically performed at a urology nurse visit or by a home health nurse.   Electronically signed by:  Donnita Falls, FNP   07/20/23    11:41 AM

## 2023-07-20 NOTE — Patient Instructions (Signed)
Overactive bladder (OAB) overview for patients:  Symptoms may include: urinary urgency ("gotta go" feeling) urinary frequency (voiding >8 times per day) night time urination (nocturia) urge incontinence of urine (UUI)  While we do not know the exact etiology of OAB, several treatment options exist including:  Behavioral therapy: Reducing fluid intake Decreasing bladder stimulants (such as caffeine) and irritants (such as acidic food, spicy foods, alcohol) Urge suppression strategies Bladder retraining via timed voiding  Pelvic floor physical therapy  Medication(s) - can use one or both of the drug classes below. Anticholinergic / antimuscarinic medications:  Mechanism of action: Activate M3 receptors to reduce detrusor stimulation and increase bladder capacity  (parasympathetic nervous system). Effect: Relaxes the bladder to decrease overactivity, increase bladder storage capacity, and increase time between voids. Onset: Slow acting (may take 8-12 weeks to determine efficacy). Medications include: Vesicare (Solifenacin), Ditropan (Oxybutynin), Detrol (Tolterodine), Toviaz (Fesoterodine), Sanctura (Trospium), Urispas (Flavoxate), Enablex (Darifenacin), Bentyl (Dicyclomine), Levsin (Hyoscyamine ). Potential side effects include but are not limited to: Dry eyes, dry mouth, constipation, cognitive impairment, dementia risk with long term use, and urinary retention/ incomplete bladder emptying. Insurance companies generally prefer for patients to try 1-2 anticholinergic / antimuscarinic medications first due to low cost. Some exceptions are made based on patient-specific comorbidities / risk factors. Beta-3 agonist medications: Mechanism of action: Stimulates selective B3 adrenergic receptors to cause smooth muscle bladder relaxation (sympathetic nervous system). Effect: Relaxes the bladder to decrease overactivity, increase bladder storage capacity, and increase time  between voids. Onset: Slow acting (may take 8-12 weeks to determine efficacy). Medications include: Myrbetriq (Mirabegron) and Vibegron Stephanie Moreno). Potential side effects include but are not limited to: urinary retention / incomplete bladder emptying and elevated blood pressure (more likely to occur in individuals with pre-existing uncontrolled hypertension). These medications tend to be more expensive than the anticholinergic / antimuscarinic medications.   For patients with refractory OAB (if the above treatment options have been unsuccessful): Posterior tibial nerve stimulation (PTNS). Small acupuncture-type needle inserted near ankle with electric current to stimulate bladder via posterior tibial nerve pathway. Initially requires 12 weekly in-office treatments lasting 30 minutes each; followed by monthly in-office treatments lasting 30 minutes each for 1 year.  Bladder Botox injections. How it is done: Typically done via in-office cystoscopy; sometimes done in the OR depending on the situation. The bladder is numbed with lidocaine instilled via a catheter. Then the urologist injects Botox into the bladder muscle wall in about 20 locations. Causes local paralysis of the bladder muscle at the injection sites to reduce bladder muscle overactivity / spasms. The effect lasts for approximately 6 months and cannot be reversed once performed. Risks may included but are not limited to: infection, incomplete bladder emptying/ urinary retention, short term need for self-catheterization or indwelling catheter, and need for repeat therapy. There is a 5-12% chance of needing to catheterize with Botox - that usually resolves in a few months as the Botox wears off. Typically Botox injections would need to be repeated every 3-12 months since this is not a permanent therapy.  Sacral neuromodulation trial (Medtronic lnterStim or Axonics implant). Sacral neuromodulation is FDA-approved for uncontrolled urinary  urgency, urinary frequency, urinary urge incontinence, non-obstructive urinary retention, or fecal incontinence. It is not FDA-approved as a treatment for pain. The goal of this therapy is at least a 50% improvement in symptoms. It is NOT realistic to expect a 100% cure. This is a a 2-step outpatient procedure. After a successful  test period, a permanent wire and generator are placed in the OR. We discussed the risk of infection. We reviewed the fact that about 30% of patients fail the test phase and are not candidates for permanent generator placement. During the 1-2 week trial phase, symptoms are documented by the patient to determine response. If patient gets at least a 50% improvement in symptoms, they may then proceed with Step 2. Step 1: Trial lead placement. Per physician discretion, may done one of two ways: Percutaneous nerve evaluation (PNE) in the [facility] urology office. Performed by urologist under local anesthesia (numbing the area with lidocaine) using a spinal needle for placement of test wire, which usually stays in place for 5-7 days to determine therapy response. Test lead placement in OR under anesthesia. Usually stays in place 2 weeks to determine therapy response. > Step 2: Permanent implantation of sacral neuromodulation device, which is performed in the OR.  Sacral neuromodulation implants: All are conditionally MRI safe. Manufacturer: Medtronic Website: [employer] therapy/right-for-you.[email or web address] Options: lnterStim X: Non-rechargeable. The battery lasts 10 years on average. lnterStim Micro: Rechargeable. The battery lasts 15 years on average and must be charged routinely. Approximately 50% smaller implant than lnterStim X implant.  Manufacturer: Axonics Website: [employer]realrelief[employer]axonics[employer]com Options: Non-rechargeable (Axonics F15): The battery lasts 15 years on average. Rechargeable (Axonics R20):  The battery lasts 20 years on average and must be charged in office for about 1 hour every 6-10 months on average. Approximately 50% smaller implant than Axonics non-rechargeable implant.  Note: Generally the rechargeable devices are only advised for very small or thin patients who may not have sufficient adipose tissue to comfortably overlay the implanted device.  Suprapubic catheter (SP tube) placement. Only done in severely refractory OAB when all other options have failed or are not a viable treatment choice depending on patient factors. Involves placement of a catheter through the lower abdomen into the bladder to continuously drain the bladder into an external collection bag, which patient can then empty at their convenience every few hours. Done via an outpatient surgical procedure in the OR under anesthesia. Risks may included but are not limited to: surgical site pain, infections, skin irritation / breakdown, chronic bacteriuria, symptomatic UTls. The SP tube must stay in place continuously. This is a reversible procedure however - the insertion site will close if catheter is removed for more than a few hours. The SP tube must be exchanged routinely every 4 weeks to prevent the catheter from becoming clogged with sediment. SP tube exchanges are typically performed at a urology nurse visit or by a home health nurse.

## 2023-07-24 DIAGNOSIS — E1165 Type 2 diabetes mellitus with hyperglycemia: Secondary | ICD-10-CM | POA: Diagnosis not present

## 2023-07-24 DIAGNOSIS — E539 Vitamin B deficiency, unspecified: Secondary | ICD-10-CM | POA: Diagnosis not present

## 2023-07-24 DIAGNOSIS — I10 Essential (primary) hypertension: Secondary | ICD-10-CM | POA: Diagnosis not present

## 2023-07-25 ENCOUNTER — Telehealth: Payer: Self-pay | Admitting: Orthopedic Surgery

## 2023-07-25 DIAGNOSIS — G8929 Other chronic pain: Secondary | ICD-10-CM

## 2023-07-25 DIAGNOSIS — M25561 Pain in right knee: Secondary | ICD-10-CM

## 2023-07-25 MED ORDER — MELOXICAM 7.5 MG PO TABS: 7.5000 mg | ORAL_TABLET | Freq: Two times a day (BID) | ORAL | 10 refills | Status: DC

## 2023-07-25 NOTE — Telephone Encounter (Signed)
Was resent to different pharmacy previous was sent to mail order

## 2023-07-25 NOTE — Telephone Encounter (Signed)
Dr. Mort Sawyers pt - spoke w/the pt, she is requesting a refill on Meloxicam 7.5, 60 tablets, 2 times daily to be sent to [facility]

## 2023-07-27 ENCOUNTER — Other Ambulatory Visit (HOSPITAL_COMMUNITY): Payer: Self-pay | Admitting: Internal Medicine

## 2023-07-27 DIAGNOSIS — D509 Iron deficiency anemia, unspecified: Secondary | ICD-10-CM | POA: Diagnosis not present

## 2023-07-27 DIAGNOSIS — K219 Gastro-esophageal reflux disease without esophagitis: Secondary | ICD-10-CM | POA: Diagnosis not present

## 2023-07-27 DIAGNOSIS — K5900 Constipation, unspecified: Secondary | ICD-10-CM | POA: Diagnosis not present

## 2023-07-27 DIAGNOSIS — R945 Abnormal results of liver function studies: Secondary | ICD-10-CM | POA: Diagnosis not present

## 2023-07-27 DIAGNOSIS — Z1231 Encounter for screening mammogram for malignant neoplasm of breast: Secondary | ICD-10-CM

## 2023-07-27 DIAGNOSIS — I129 Hypertensive chronic kidney disease with stage 1 through stage 4 chronic kidney disease, or unspecified chronic kidney disease: Secondary | ICD-10-CM | POA: Diagnosis not present

## 2023-07-27 DIAGNOSIS — N1831 Chronic kidney disease, stage 3a: Secondary | ICD-10-CM | POA: Diagnosis not present

## 2023-07-27 DIAGNOSIS — E1165 Type 2 diabetes mellitus with hyperglycemia: Secondary | ICD-10-CM | POA: Diagnosis not present

## 2023-07-27 DIAGNOSIS — Z9012 Acquired absence of left breast and nipple: Secondary | ICD-10-CM | POA: Diagnosis not present

## 2023-07-27 DIAGNOSIS — K30 Functional dyspepsia: Secondary | ICD-10-CM | POA: Diagnosis not present

## 2023-07-27 DIAGNOSIS — E041 Nontoxic single thyroid nodule: Secondary | ICD-10-CM | POA: Diagnosis not present

## 2023-07-27 DIAGNOSIS — R0600 Dyspnea, unspecified: Secondary | ICD-10-CM | POA: Diagnosis not present

## 2023-07-27 DIAGNOSIS — E782 Mixed hyperlipidemia: Secondary | ICD-10-CM | POA: Diagnosis not present

## 2023-08-02 DIAGNOSIS — M5416 Radiculopathy, lumbar region: Secondary | ICD-10-CM | POA: Diagnosis not present

## 2023-08-02 DIAGNOSIS — M2569 Stiffness of other specified joint, not elsewhere classified: Secondary | ICD-10-CM | POA: Diagnosis not present

## 2023-08-02 DIAGNOSIS — M62562 Muscle wasting and atrophy, not elsewhere classified, left lower leg: Secondary | ICD-10-CM | POA: Diagnosis not present

## 2023-08-02 DIAGNOSIS — M62552 Muscle wasting and atrophy, not elsewhere classified, left thigh: Secondary | ICD-10-CM | POA: Diagnosis not present

## 2023-08-02 DIAGNOSIS — M62551 Muscle wasting and atrophy, not elsewhere classified, right thigh: Secondary | ICD-10-CM | POA: Diagnosis not present

## 2023-08-06 DIAGNOSIS — M62552 Muscle wasting and atrophy, not elsewhere classified, left thigh: Secondary | ICD-10-CM | POA: Diagnosis not present

## 2023-08-06 DIAGNOSIS — M62562 Muscle wasting and atrophy, not elsewhere classified, left lower leg: Secondary | ICD-10-CM | POA: Diagnosis not present

## 2023-08-06 DIAGNOSIS — M5416 Radiculopathy, lumbar region: Secondary | ICD-10-CM | POA: Diagnosis not present

## 2023-08-06 DIAGNOSIS — M2569 Stiffness of other specified joint, not elsewhere classified: Secondary | ICD-10-CM | POA: Diagnosis not present

## 2023-08-06 DIAGNOSIS — M62551 Muscle wasting and atrophy, not elsewhere classified, right thigh: Secondary | ICD-10-CM | POA: Diagnosis not present

## 2023-08-10 DIAGNOSIS — M62562 Muscle wasting and atrophy, not elsewhere classified, left lower leg: Secondary | ICD-10-CM | POA: Diagnosis not present

## 2023-08-10 DIAGNOSIS — M5416 Radiculopathy, lumbar region: Secondary | ICD-10-CM | POA: Diagnosis not present

## 2023-08-10 DIAGNOSIS — M2569 Stiffness of other specified joint, not elsewhere classified: Secondary | ICD-10-CM | POA: Diagnosis not present

## 2023-08-10 DIAGNOSIS — M62551 Muscle wasting and atrophy, not elsewhere classified, right thigh: Secondary | ICD-10-CM | POA: Diagnosis not present

## 2023-08-10 DIAGNOSIS — M62552 Muscle wasting and atrophy, not elsewhere classified, left thigh: Secondary | ICD-10-CM | POA: Diagnosis not present

## 2023-08-13 DIAGNOSIS — M62551 Muscle wasting and atrophy, not elsewhere classified, right thigh: Secondary | ICD-10-CM | POA: Diagnosis not present

## 2023-08-13 DIAGNOSIS — M2569 Stiffness of other specified joint, not elsewhere classified: Secondary | ICD-10-CM | POA: Diagnosis not present

## 2023-08-13 DIAGNOSIS — M5416 Radiculopathy, lumbar region: Secondary | ICD-10-CM | POA: Diagnosis not present

## 2023-08-13 DIAGNOSIS — M62562 Muscle wasting and atrophy, not elsewhere classified, left lower leg: Secondary | ICD-10-CM | POA: Diagnosis not present

## 2023-08-13 DIAGNOSIS — M62552 Muscle wasting and atrophy, not elsewhere classified, left thigh: Secondary | ICD-10-CM | POA: Diagnosis not present

## 2023-08-15 ENCOUNTER — Encounter (HOSPITAL_COMMUNITY): Payer: Self-pay

## 2023-08-15 ENCOUNTER — Ambulatory Visit (HOSPITAL_COMMUNITY)
Admission: RE | Admit: 2023-08-15 | Discharge: 2023-08-15 | Disposition: A | Discharge status: Home / Self Care | Payer: 59 | Source: Ambulatory Visit | Attending: Internal Medicine | Admitting: Internal Medicine

## 2023-08-15 DIAGNOSIS — Z1231 Encounter for screening mammogram for malignant neoplasm of breast: Secondary | ICD-10-CM | POA: Insufficient documentation

## 2023-08-17 ENCOUNTER — Telehealth: Payer: Self-pay

## 2023-08-17 DIAGNOSIS — M62551 Muscle wasting and atrophy, not elsewhere classified, right thigh: Secondary | ICD-10-CM | POA: Diagnosis not present

## 2023-08-17 DIAGNOSIS — M62552 Muscle wasting and atrophy, not elsewhere classified, left thigh: Secondary | ICD-10-CM | POA: Diagnosis not present

## 2023-08-17 DIAGNOSIS — M2569 Stiffness of other specified joint, not elsewhere classified: Secondary | ICD-10-CM | POA: Diagnosis not present

## 2023-08-17 DIAGNOSIS — M5416 Radiculopathy, lumbar region: Secondary | ICD-10-CM | POA: Diagnosis not present

## 2023-08-17 DIAGNOSIS — M62562 Muscle wasting and atrophy, not elsewhere classified, left lower leg: Secondary | ICD-10-CM | POA: Diagnosis not present

## 2023-08-17 NOTE — Telephone Encounter (Addendum)
Medication prior authorization request received.  Completed PA request through cover my meds for drug Myrbetriq 50 MG. KEY: [id number]  Approved: Pending   (Please appeal denial for Myrbetriq. She is not a safe candidate for anticholinergic medications due to risk for side effects based on patient's age and comorbidities (including prior stroke). )

## 2023-08-20 ENCOUNTER — Other Ambulatory Visit (HOSPITAL_COMMUNITY): Payer: Self-pay | Admitting: Internal Medicine

## 2023-08-20 DIAGNOSIS — R928 Other abnormal and inconclusive findings on diagnostic imaging of breast: Secondary | ICD-10-CM

## 2023-08-20 DIAGNOSIS — M5416 Radiculopathy, lumbar region: Secondary | ICD-10-CM | POA: Diagnosis not present

## 2023-08-20 DIAGNOSIS — M62551 Muscle wasting and atrophy, not elsewhere classified, right thigh: Secondary | ICD-10-CM | POA: Diagnosis not present

## 2023-08-20 DIAGNOSIS — M2569 Stiffness of other specified joint, not elsewhere classified: Secondary | ICD-10-CM | POA: Diagnosis not present

## 2023-08-20 DIAGNOSIS — M62562 Muscle wasting and atrophy, not elsewhere classified, left lower leg: Secondary | ICD-10-CM | POA: Diagnosis not present

## 2023-08-20 DIAGNOSIS — M62552 Muscle wasting and atrophy, not elsewhere classified, left thigh: Secondary | ICD-10-CM | POA: Diagnosis not present

## 2023-08-21 ENCOUNTER — Ambulatory Visit (INDEPENDENT_AMBULATORY_CARE_PROVIDER_SITE_OTHER): Payer: 59 | Admitting: Nurse Practitioner

## 2023-08-21 ENCOUNTER — Encounter: Payer: Self-pay | Admitting: Nurse Practitioner

## 2023-08-21 VITALS — BP 120/74 | HR 93 | Ht 63.0 in | Wt 171.0 lb

## 2023-08-21 DIAGNOSIS — Z7984 Long term (current) use of oral hypoglycemic drugs: Secondary | ICD-10-CM

## 2023-08-21 DIAGNOSIS — Z7985 Long-term (current) use of injectable non-insulin antidiabetic drugs: Secondary | ICD-10-CM

## 2023-08-21 DIAGNOSIS — E1122 Type 2 diabetes mellitus with diabetic chronic kidney disease: Secondary | ICD-10-CM | POA: Diagnosis not present

## 2023-08-21 DIAGNOSIS — Z794 Long term (current) use of insulin: Secondary | ICD-10-CM

## 2023-08-21 DIAGNOSIS — N1832 Chronic kidney disease, stage 3b: Secondary | ICD-10-CM | POA: Diagnosis not present

## 2023-08-21 DIAGNOSIS — E782 Mixed hyperlipidemia: Secondary | ICD-10-CM

## 2023-08-21 DIAGNOSIS — I10 Essential (primary) hypertension: Secondary | ICD-10-CM | POA: Diagnosis not present

## 2023-08-21 MED ORDER — TRULICITY 3 MG/0.5ML ~~LOC~~ SOAJ: 3.0000 mg | SUBCUTANEOUS | 1 refills | Status: DC

## 2023-08-21 MED ORDER — ONETOUCH VERIO W/DEVICE KIT: PACK | 0 refills | Status: AC

## 2023-08-21 MED ORDER — FLUTICASONE FUROATE-VILANTEROL 100-25 MCG/ACT IN AEPB: 1.0000 | INHALATION_SPRAY | Freq: Every day | RESPIRATORY_TRACT | 3 refills | Status: AC

## 2023-08-21 NOTE — Progress Notes (Signed)
08/21/2023, 11:46 AM  Endocrinology follow-up note   Subjective:    Patient ID: Stephanie Moreno, female    DOB: 10-Aug-1945.  [name] is being seen in follow-up after she was seen in consultation for management of currently uncontrolled symptomatic diabetes requested by  Benita Stabile, MD.   Past Medical History:  Diagnosis Date   Acid reflux disease    Anemia    Anxiety    Asthma    Breast cancer (HCC)    left side   Chronic back pain    Chronic kidney disease    Depression    Diabetes mellitus without complication (HCC)    Diverticulitis    Hypertension    Lumbosacral spondylosis    Per previous PCP records   Sleep apnea    Thyroid disease     Past Surgical History:  Procedure Laterality Date   ABDOMINAL HYSTERECTOMY     BACK SURGERY     BALLOON DILATION N/A 04/24/2022   Procedure: BALLOON DILATION;  Surgeon: Lanelle Bal, DO;  Location: [facility];  Service: Endoscopy;  Laterality: N/A;   BIOPSY  02/19/2020   Procedure: BIOPSY;  Surgeon: Lanelle Bal, DO;  Location: [facility];  Service: Endoscopy;;   BIOPSY  04/24/2022   Procedure: BIOPSY;  Surgeon: Lanelle Bal, DO;  Location: [facility];  Service: Endoscopy;;   BREAST BIOPSY Right    x 3   BREAST LUMPECTOMY Left 2003   COLONOSCOPY WITH PROPOFOL N/A 02/19/2020   non-bleeding internal hemorrhoids, many small-mouthed diverticula in entire colon.   ESOPHAGOGASTRODUODENOSCOPY (EGD) WITH PROPOFOL N/A 02/19/2020    benign-appearing esophageal stenosis s/p dilation, gastritis s/p biopsy, normal duodenum. Negative H.pylori, negative duodenal biopsy.    ESOPHAGOGASTRODUODENOSCOPY (EGD) WITH PROPOFOL N/A 04/24/2022   small hiatal hernia, mild Schatzki ring, s/p dilation. Gastritis s/p biopsy. Negative H.pylori.   GIVENS CAPSULE STUDY N/A 06/14/2020   Procedure: GIVENS CAPSULE STUDY;  Surgeon: Lanelle Bal, DO;  Location: [facility];  Service: Endoscopy;   Laterality: N/A;  7:30am   THYROIDECTOMY     tummy tuck     at time of breast surgery 2003    Social History   Socioeconomic History   Marital status: Divorced    Spouse name: Not on file   Number of children: Not on file   Years of education: Not on file   Highest education level: Not on file  Occupational History   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 [place]  Tobacco Use   Smoking status: Never   Smokeless tobacco: Never  Vaping Use   Vaping status: Never Used  Substance and Sexual Activity   Alcohol use: Not Currently   Drug use: Not Currently   Sexual activity: Not on file  Other Topics Concern   Not on file  Social History Narrative   Not on file   Social Drivers of Health   Financial Resource Strain: Not on file  Food Insecurity: Not on file  Transportation Needs: Not on file  Physical Activity: Not on file  Stress: Not on file  Social Connections: Not on file    Family History  Problem Relation Age of Onset   Diabetes Mother    Thyroid disease Mother    Colon cancer Neg Hx     Outpatient Encounter Medications as of 08/21/2023  Medication Sig   acetaminophen (TYLENOL) 325 MG tablet  Take 650 mg by mouth every 4 (four) hours as needed.   amLODipine (NORVASC) 10 MG tablet Take 5 mg by mouth daily.   Azelastine HCl 0.15 % SOLN Place 1 spray into both nostrils daily.   Blood Glucose Monitoring Suppl (ONETOUCH VERIO) w/Device KIT Use to check glucose twice daily, use as backup to CGM   Continuous Blood Gluc Sensor (FREESTYLE LIBRE 2 SENSOR) MISC 2 each by Does not apply route every 14 (fourteen) days.   Continuous Glucose Receiver (FREESTYLE LIBRE 2 READER) DEVI USE TO CHECK GLUCOSE AS DIRECTED   Continuous Glucose Receiver (FREESTYLE LIBRE 3 READER) DEVI Use the Reader to monitor your blood sugar readings daily   Continuous Glucose Sensor (FREESTYLE LIBRE 14 DAY SENSOR) MISC APPLY NEW SENSOR EVERY 14 DAYS *NEW PRESCRIPTION REQUEST*    Continuous Glucose Sensor (FREESTYLE LIBRE 3 PLUS SENSOR) MISC Change sensor every 15 days.   Continuous Glucose Sensor (FREESTYLE LIBRE 3 SENSOR) MISC 1 Application by Does not apply route every 14 (fourteen) days.   fluticasone (FLOVENT HFA) 220 MCG/ACT inhaler Inhale 1 puff into the lungs 2 (two) times daily.   fluticasone furoate-vilanterol (BREO ELLIPTA) 100-25 MCG/ACT AEPB Inhale 1 puff into the lungs daily.   furosemide (LASIX) 20 MG tablet Take 1 tablet (20 mg total) by mouth every other day. Takes 40 mg every other day. On alternate days take 80 mg (Patient taking differently: Take 20 mg by mouth 2 (two) times daily.)   hydrALAZINE (APRESOLINE) 50 MG tablet Take 50 mg by mouth 2 (two) times daily.   HYDROcodone-acetaminophen (NORCO) 5-325 MG tablet Take 1 tablet by mouth 2 (two) times daily as needed.   Insulin Degludec (TRESIBA) 100 UNIT/ML SOLN Inject 20 Units into the skin daily.   Insulin Pen Needle (BD PEN NEEDLE MICRO U/F) 32G X 6 MM MISC USE AS DIRECTED ONCE DAILY *NEW PRESCRIPTION REQUEST*   Insulin Pen Needle 31G X 6 MM MISC 1 each by Does not apply route daily.   levocetirizine (XYZAL) 5 MG tablet TAKE 1 TABLET BY MOUTH EVERY EVENING AS NEEDED FOR ALLERGIES   lidocaine (LIDODERM) 5 % Place 1 patch onto the skin daily.   losartan (COZAAR) 25 MG tablet Take 25 mg by mouth daily.   lubiprostone (AMITIZA) 8 MCG capsule Take 1 capsule (8 mcg total) by mouth 2 (two) times daily with a meal.   meloxicam (MOBIC) 7.5 MG tablet Take 1 tablet (7.5 mg total) by mouth 2 (two) times daily.   mirabegron ER (MYRBETRIQ) 50 MG TB24 tablet TAKE 1 TABLET BY MOUTH EVERY DAY AT 9 AM *NEW PRESCRIPTION REQUEST*   mirabegron ER (MYRBETRIQ) 50 MG TB24 tablet TAKE 1 TABLET BY MOUTH DAILY AT  9 AM   omeprazole (PRILOSEC) 40 MG capsule Take 1 capsule (40 mg total) by mouth daily before breakfast.   ondansetron (ZOFRAN) 4 MG tablet TAKE 1 TABLET BY MOUTH EVERY 8 HOURS AS NEEDED FOR NAUSEA AND VOMITING  (Patient taking differently: Take 4 mg by mouth every 8 (eight) hours as needed for nausea or vomiting.)   ondansetron (ZOFRAN) 4 MG tablet Take 1 tablet (4 mg total) by mouth every 8 (eight) hours as needed for nausea or vomiting.   OVER THE COUNTER MEDICATION Prevagine one per day for memory.   pantoprazole (PROTONIX) 40 MG tablet Take 1 tablet (40 mg total) by mouth 2 (two) times daily before a meal.   rosuvastatin (CRESTOR) 10 MG tablet TAKE 1 TABLET BY MOUTH DAILY AT  9  PM (AT BEDTIME)   sucralfate (CARAFATE) 1 g tablet Take 1 tablet (1 g total) by mouth 4 (four) times daily -  with meals and at bedtime.   [DISCONTINUED] Dulaglutide (TRULICITY) 3 MG/0.5ML SOAJ INJECT 3MG  INTO THE SKIN 1 TIME PER WEEK *NEW PRESCRIPTION REQUEST*   [DISCONTINUED] Fluticasone Furoate (ARNUITY ELLIPTA) 200 MCG/ACT AEPB Inhale 1 puff into the lungs daily.   Dulaglutide (TRULICITY) 3 MG/0.5ML SOAJ Inject 3 mg into the skin once a week.   insulin glargine (LANTUS SOLOSTAR) 100 UNIT/ML Solostar Pen INJECT 35 UNITS INTO THE SKIN AT BEDTIME *NEW PRESCRIPTION REQUEST* (Patient not taking: Reported on 08/21/2023)   No facility-administered encounter medications on file as of 08/21/2023.    ALLERGIES: Allergies  Allergen Reactions   Codeine Nausea And Vomiting and Anaphylaxis   Carvedilol     Cold sweats, diarrhea, severe hot flashes   Hydrochlorothiazide Other (See Comments)    Other reaction(s): stomach upset   Lisinopril Nausea And Vomiting    Other reaction(s): cough   Oxybutynin Other (See Comments)   Penicillins Hives    VACCINATION STATUS: Immunization History  Administered Date(s) Administered   Fluad Quad(high Dose 65+) 03/17/2021   Pneumococcal Conjugate-13 09/02/2014   Pneumococcal Polysaccharide-23 07/03/2010   Tdap 07/03/2000    Diabetes She presents for her follow-up diabetic visit. She has type 2 diabetes mellitus. Onset time: Diagnosed at approx age of 85. Her disease course has been  improving. There are no hypoglycemic associated symptoms. Associated symptoms include fatigue and foot paresthesias. Pertinent negatives for diabetes include no polydipsia, no polyuria and no weight loss. There are no hypoglycemic complications. Symptoms are stable. Diabetic complications include a CVA, nephropathy and peripheral neuropathy. Risk factors for coronary artery disease include diabetes mellitus, dyslipidemia, hypertension, post-menopausal and sedentary lifestyle. Current diabetic treatment includes insulin injections (and Trulicity). She is compliant with treatment most of the time. Her weight is fluctuating minimally. She is following a generally healthy diet. When asked about meal planning, she reported none. She has not had a previous visit with a dietitian. She rarely participates in exercise. (She presents today with her CGM (no new data since January 1st), says her receiver broke, she did call to get a replacement shipped but has not yet received it.  Her most recent A1c on 07/24/23 was 6.8%, improving from last visit of 7.7%.  She does not have a back up meter at home to check glucose.) An ACE inhibitor/angiotensin II receptor blocker is being taken. She does not see a podiatrist.Eye exam is current.  Hypertension This is a chronic problem. The current episode started more than 1 year ago. The problem is unchanged. The problem is controlled. Associated symptoms include peripheral edema. Agents associated with hypertension include thyroid hormones. Risk factors for coronary artery disease include diabetes mellitus, dyslipidemia, post-menopausal state and sedentary lifestyle. Past treatments include diuretics, beta blockers, ACE inhibitors and angiotensin blockers. There are no compliance problems.  Hypertensive end-organ damage includes kidney disease and CVA. Identifiable causes of hypertension include chronic renal disease, sleep apnea and a thyroid problem.  Hyperlipidemia This is a chronic  problem. The current episode started more than 1 year ago. The problem is controlled. Recent lipid tests were reviewed and are normal. Exacerbating diseases include chronic renal disease and hypothyroidism. Factors aggravating her hyperlipidemia include beta blockers. Current antihyperlipidemic treatment includes statins. The current treatment provides moderate improvement of lipids. There are no compliance problems.  Risk factors for coronary artery disease include diabetes mellitus, dyslipidemia, hypertension, obesity,  post-menopausal and a sedentary lifestyle.     Review of systems  Constitutional: + stable body weight,  current Body mass index is 30.29 kg/m. , + fatigue, no subjective hyperthermia, no subjective hypothermia Eyes: no blurry vision, no xerophthalmia ENT: no sore throat, no nodules palpated in throat, no dysphagia/odynophagia, no hoarseness Cardiovascular: no chest pain, no shortness of breath, no palpitations, + ankle swelling Respiratory: no cough, no shortness of breath Gastrointestinal: no nausea/vomiting/diarrhea Genitourinary: + polyuria, stress incontinence (improved now that off SGLT2i) Musculoskeletal: ongoing back pain-will be getting injections soon, walks with walker due to previous CVA and disequilibrium- has frequent falls Skin: no rashes, no hyperemia Neurological: no tremors, no dizziness, + numbness/tingling to BLE (on gabapentin) Psychiatric: no depression, no anxiety  Objective:     BP 120/74 (BP Location: Right Arm, Patient Position: Sitting, Cuff Size: Large)   Pulse 93   Ht 5\' 3"  (1.6 m)   Wt 171 lb (77.6 kg)   BMI 30.29 kg/m   Wt Readings from Last 3 Encounters:  08/21/23 171 lb (77.6 kg)  06/22/23 166 lb (75.3 kg)  06/22/23 166 lb (75.3 kg)    BP Readings from Last 3 Encounters:  08/21/23 120/74  07/20/23 129/75  06/22/23 120/88     Physical Exam- Limited  Constitutional:  Body mass index is 30.29 kg/m. , not in acute distress,  normal state of mind Eyes:  EOMI, no exophthalmos Musculoskeletal: no gross deformities,  walks with walker due to disequilibrium and frequent falls Skin:  no rashes, no hyperemia Neurological: no tremor with outstretched hands   Diabetic Foot Exam - Simple   No data filed     CMP ( most recent) CMP     Component Value Date/Time   NA 135 06/22/2023 1305   NA 141 07/25/2022 1232   K 3.1 (L) 06/22/2023 1305   CL 103 06/22/2023 1305   CO2 19 (L) 06/22/2023 1305   GLUCOSE 198 (H) 06/22/2023 1305   BUN 18 06/22/2023 1305   BUN 16 07/25/2022 1232   CREATININE 1.47 (H) 06/22/2023 1305   CREATININE 1.42 (H) 10/12/2020 1009   CALCIUM 9.3 06/22/2023 1305   PROT 8.1 07/25/2022 1232   ALBUMIN 4.5 07/25/2022 1232   AST 22 07/25/2022 1232   ALT 19 07/25/2022 1232   ALKPHOS 102 07/25/2022 1232   BILITOT 0.3 07/25/2022 1232   GFRNONAA 37 (L) 06/22/2023 1305   GFRNONAA 36 (L) 10/12/2020 1009   GFRAA 42 (L) 10/12/2020 1009     Diabetic Labs (most recent): Lab Results  Component Value Date   HGBA1C 7.7 (A) 01/15/2023   HGBA1C 8.3 (A) 04/20/2022   HGBA1C 8.7 12/19/2021   MICROALBUR 80 10/26/2020     Lipid Panel ( most recent) Lipid Panel     Component Value Date/Time   CHOL 91 04/19/2021 0000   TRIG 78 04/19/2021 0000   HDL 42 04/19/2021 0000   CHOLHDL 2.0 10/12/2020 1009   LDLCALC 34 04/19/2021 0000   LDLCALC 46 10/12/2020 1009      Lab Results  Component Value Date   TSH 3.94 10/12/2020   TSH 1.18 03/09/2020      Assessment & Plan:   1) Type 2 diabetes mellitus with stage 3b chronic kidney disease, without long-term current use of insulin (HCC)  - [name] has currently uncontrolled symptomatic type 2 DM since  78 years of age.  She presents today with her CGM (no new data since January 1st), says her receiver broke,  she did call to get a replacement shipped but has not yet received it.  Her most recent A1c on 07/24/23 was 6.8%, improving from last visit of  7.7%.  She does not have a back up meter at home to check glucose.   Recent labs reviewed.    - I had a long discussion with her about the progressive nature of diabetes and the pathology behind its complications. -her diabetes is complicated by CKD, CVA and she remains at a high risk for more acute and chronic complications which include CAD, CVA, CKD, retinopathy, and neuropathy. These are all discussed in detail with her.  - Nutritional counseling repeated at each appointment due to patients tendency to fall back in to old habits.  - The patient admits there is a room for improvement in their diet and drink choices. -  Suggestion is made for the patient to avoid simple carbohydrates from their diet including Cakes, Sweet Desserts / Pastries, Ice Cream, Soda (diet and regular), Sweet Tea, Candies, Chips, Cookies, Sweet Pastries, Store Bought Juices, Alcohol in Excess of 1-2 drinks a day, Artificial Sweeteners, Coffee Creamer, and "Sugar-free" Products. This will help patient to have stable blood glucose profile and potentially avoid unintended weight gain.   - I encouraged the patient to switch to unprocessed or minimally processed complex starch and increased protein intake (animal or plant source), fruits, and vegetables.   - Patient is advised to stick to a routine mealtimes to eat 3 meals a day and avoid unnecessary snacks (to snack only to correct hypoglycemia).  - I have approached her with the following individualized plan to manage  her diabetes and patient agrees:   - she will need at least basal insulin in order for her to achieve control of diabetes to target, and she accepts this treatment option at this time.    -She is advised to lower her Tresiba to 12 units SQ nightly (would rather err on side of caution given her inability to properly monitor glucose at this time), and she can continue her Trulicity 3 mg SQ weekly.  She can stay off Farxiga (and like products- due to polyuria  and stress incontinence) and Glipizide (due to hypoglycemia).  -She is encouraged to continue monitoring blood glucose twice daily (using her CGM), before breakfast and before bed, and to call the clinic if she has readings less than 70 or greater than 300 for 3 tests in a row.   - Specific targets for  A1c;  LDL, HDL,  and Triglycerides were discussed with the patient.  2) Blood Pressure /Hypertension:   Her blood pressure is controlled to target for her age.   she is advised to continue her current medications including Losartan 25 mg p.o. daily with breakfast, and Lasix 20 every other day.  Will defer changes to nephrologist.  3) Lipids/Hyperlipidemia:    Review of her recent lipid panel from 09/14/22 showed controlled LDL at 45.  she  is advised to continue Crestor 10 mg p.o. daily at bedtime.   Side effects and precautions discussed with her.  4)  Weight/Diet:  Her Body mass index is 30.29 kg/m.  -   clearly complicating her diabetes care.   she is  a candidate for weight loss. I discussed with her the fact that loss of 5 - 10% of her  current body weight will have the most impact on her diabetes management.  Exercise, and detailed carbohydrates information provided  -  detailed on discharge instructions.  5) Chronic Care/Health Maintenance: -she  is on ACEI/ARB and Statin medications and  is encouraged to initiate and continue to follow up with Ophthalmology, Dentist,  Podiatrist at least yearly or according to recommendations, and advised to  stay away from smoking. I have recommended yearly flu vaccine and pneumonia vaccine at least every 5 years; she cannot exercise optimally due to her disequilibrium.  She is advised to sleep for at least 7 hours a day.  - she is  advised to maintain close follow up with Benita Stabile, MD for primary care needs, as well as her other providers for optimal and coordinated care.     I spent  30  minutes in the care of the patient today including review  of labs from CMP, Lipids, Thyroid Function, Hematology (current and previous including abstractions from other facilities); face-to-face time discussing  her blood glucose readings/logs, discussing hypoglycemia and hyperglycemia episodes and symptoms, medications doses, her options of short and long term treatment based on the latest standards of care / guidelines;  discussion about incorporating lifestyle medicine;  and documenting the encounter. Risk reduction counseling performed per USPSTF guidelines to reduce obesity and cardiovascular risk factors.     Please refer to Patient Instructions for Blood Glucose Monitoring and Insulin/Medications Dosing Guide"  in media tab for additional information. Please  also refer to " Patient Self Inventory" in the Media  tab for reviewed elements of pertinent patient history.  [name] participated in the discussions, expressed understanding, and voiced agreement with the above plans.  All questions were answered to her satisfaction. she is encouraged to contact clinic should she have any questions or concerns prior to her return visit.   Follow up plan: - Return in about 4 months (around 12/19/2023) for Diabetes F/U with A1c in office, No previsit labs, Bring meter and logs.  Ronny Bacon, [facility] [address] [place], [place] [address] Phone: [phone] Fax: [phone]  08/21/2023, 11:46 AM

## 2023-08-25 ENCOUNTER — Other Ambulatory Visit: Payer: Self-pay | Admitting: Nurse Practitioner

## 2023-08-27 ENCOUNTER — Emergency Department (HOSPITAL_COMMUNITY): Payer: 59

## 2023-08-27 ENCOUNTER — Inpatient Hospital Stay (HOSPITAL_COMMUNITY)
Admission: EM | Admit: 2023-08-27 | Discharge: 2023-08-30 | DRG: 177 | Disposition: A | Discharge status: Home Health | Payer: 59 | Attending: Internal Medicine | Admitting: Internal Medicine

## 2023-08-27 ENCOUNTER — Other Ambulatory Visit: Payer: Self-pay

## 2023-08-27 DIAGNOSIS — E66811 Obesity, class 1: Secondary | ICD-10-CM | POA: Diagnosis present

## 2023-08-27 DIAGNOSIS — J1282 Pneumonia due to coronavirus disease 2019: Secondary | ICD-10-CM | POA: Diagnosis present

## 2023-08-27 DIAGNOSIS — Z6830 Body mass index (BMI) 30.0-30.9, adult: Secondary | ICD-10-CM

## 2023-08-27 DIAGNOSIS — M6282 Rhabdomyolysis: Secondary | ICD-10-CM | POA: Diagnosis present

## 2023-08-27 DIAGNOSIS — E876 Hypokalemia: Secondary | ICD-10-CM | POA: Diagnosis not present

## 2023-08-27 DIAGNOSIS — G4489 Other headache syndrome: Secondary | ICD-10-CM | POA: Diagnosis not present

## 2023-08-27 DIAGNOSIS — R748 Abnormal levels of other serum enzymes: Secondary | ICD-10-CM | POA: Insufficient documentation

## 2023-08-27 DIAGNOSIS — Z7985 Long-term (current) use of injectable non-insulin antidiabetic drugs: Secondary | ICD-10-CM | POA: Diagnosis not present

## 2023-08-27 DIAGNOSIS — E890 Postprocedural hypothyroidism: Secondary | ICD-10-CM | POA: Diagnosis not present

## 2023-08-27 DIAGNOSIS — E785 Hyperlipidemia, unspecified: Secondary | ICD-10-CM | POA: Diagnosis not present

## 2023-08-27 DIAGNOSIS — R531 Weakness: Secondary | ICD-10-CM

## 2023-08-27 DIAGNOSIS — R519 Headache, unspecified: Secondary | ICD-10-CM | POA: Diagnosis not present

## 2023-08-27 DIAGNOSIS — Z888 Allergy status to other drugs, medicaments and biological substances status: Secondary | ICD-10-CM

## 2023-08-27 DIAGNOSIS — W19XXXA Unspecified fall, initial encounter: Secondary | ICD-10-CM

## 2023-08-27 DIAGNOSIS — Z7951 Long term (current) use of inhaled steroids: Secondary | ICD-10-CM

## 2023-08-27 DIAGNOSIS — Z79899 Other long term (current) drug therapy: Secondary | ICD-10-CM

## 2023-08-27 DIAGNOSIS — N1832 Chronic kidney disease, stage 3b: Secondary | ICD-10-CM | POA: Diagnosis not present

## 2023-08-27 DIAGNOSIS — U071 COVID-19: Secondary | ICD-10-CM | POA: Diagnosis not present

## 2023-08-27 DIAGNOSIS — Z791 Long term (current) use of non-steroidal anti-inflammatories (NSAID): Secondary | ICD-10-CM

## 2023-08-27 DIAGNOSIS — Z853 Personal history of malignant neoplasm of breast: Secondary | ICD-10-CM | POA: Diagnosis not present

## 2023-08-27 DIAGNOSIS — Z885 Allergy status to narcotic agent status: Secondary | ICD-10-CM | POA: Diagnosis not present

## 2023-08-27 DIAGNOSIS — Z794 Long term (current) use of insulin: Secondary | ICD-10-CM

## 2023-08-27 DIAGNOSIS — R296 Repeated falls: Secondary | ICD-10-CM

## 2023-08-27 DIAGNOSIS — I129 Hypertensive chronic kidney disease with stage 1 through stage 4 chronic kidney disease, or unspecified chronic kidney disease: Secondary | ICD-10-CM | POA: Diagnosis present

## 2023-08-27 DIAGNOSIS — E1122 Type 2 diabetes mellitus with diabetic chronic kidney disease: Secondary | ICD-10-CM | POA: Diagnosis present

## 2023-08-27 DIAGNOSIS — J45909 Unspecified asthma, uncomplicated: Secondary | ICD-10-CM | POA: Diagnosis present

## 2023-08-27 DIAGNOSIS — R918 Other nonspecific abnormal finding of lung field: Secondary | ICD-10-CM | POA: Diagnosis not present

## 2023-08-27 DIAGNOSIS — Z8349 Family history of other endocrine, nutritional and metabolic diseases: Secondary | ICD-10-CM

## 2023-08-27 DIAGNOSIS — I672 Cerebral atherosclerosis: Secondary | ICD-10-CM | POA: Diagnosis not present

## 2023-08-27 DIAGNOSIS — R059 Cough, unspecified: Secondary | ICD-10-CM | POA: Diagnosis not present

## 2023-08-27 DIAGNOSIS — Z90710 Acquired absence of both cervix and uterus: Secondary | ICD-10-CM

## 2023-08-27 DIAGNOSIS — I6782 Cerebral ischemia: Secondary | ICD-10-CM | POA: Diagnosis not present

## 2023-08-27 DIAGNOSIS — Z833 Family history of diabetes mellitus: Secondary | ICD-10-CM | POA: Diagnosis not present

## 2023-08-27 DIAGNOSIS — Z880 Allergy status to penicillin: Secondary | ICD-10-CM

## 2023-08-27 LAB — URINALYSIS, ROUTINE W REFLEX MICROSCOPIC
Bacteria, UA: NONE SEEN
Bilirubin Urine: NEGATIVE
Glucose, UA: NEGATIVE mg/dL
Ketones, ur: NEGATIVE mg/dL
Leukocytes,Ua: NEGATIVE
Nitrite: NEGATIVE
Protein, ur: 100 mg/dL — AB
Specific Gravity, Urine: 1.01 (ref 1.005–1.030)
pH: 6 (ref 5.0–8.0)

## 2023-08-27 LAB — BASIC METABOLIC PANEL
Anion gap: 13 (ref 5–15)
BUN: 16 mg/dL (ref 8–23)
CO2: 20 mmol/L — ABNORMAL LOW (ref 22–32)
Calcium: 9.3 mg/dL (ref 8.9–10.3)
Chloride: 106 mmol/L (ref 98–111)
Creatinine, Ser: 1.51 mg/dL — ABNORMAL HIGH (ref 0.44–1.00)
GFR, Estimated: 35 mL/min — ABNORMAL LOW (ref >60)
Glucose, Bld: 111 mg/dL — ABNORMAL HIGH (ref 70–99)
Potassium: 3 mmol/L — ABNORMAL LOW (ref 3.5–5.1)
Sodium: 139 mmol/L (ref 135–145)

## 2023-08-27 LAB — CBC
HCT: 37.1 % (ref 36.0–46.0)
Hemoglobin: 12.2 g/dL (ref 12.0–15.0)
MCH: 27.7 pg (ref 26.0–34.0)
MCHC: 32.9 g/dL (ref 30.0–36.0)
MCV: 84.1 fL (ref 80.0–100.0)
Platelets: 253 10*3/uL (ref 150–400)
RBC: 4.41 MIL/uL (ref 3.87–5.11)
RDW: 13.7 % (ref 11.5–15.5)
WBC: 8.1 10*3/uL (ref 4.0–10.5)
nRBC: 0 % (ref 0.0–0.2)

## 2023-08-27 LAB — RESP PANEL BY RT-PCR (RSV, FLU A&B, COVID)  RVPGX2
Influenza A by PCR: NEGATIVE
Influenza B by PCR: NEGATIVE
Resp Syncytial Virus by PCR: NEGATIVE
SARS Coronavirus 2 by RT PCR: POSITIVE — AB

## 2023-08-27 MED ORDER — LACTATED RINGERS IV SOLN: INTRAVENOUS | Status: AC

## 2023-08-27 NOTE — ED Notes (Signed)
 ED Provider at bedside.

## 2023-08-27 NOTE — ED Provider Notes (Signed)
 [place] EMERGENCY DEPARTMENT AT [facility] Provider Note   CSN: [id number] Arrival date & time: 08/27/23  2050     History  Chief Complaint  Patient presents with   Stephanie Moreno is a 78 y.o. female.  HPI     78 year old female comes in with chief complaint of mechanical fall. Patient accompanied by daughter.  Patient has previous history of diabetes, hypertension, hyperlipidemia and UTI.  She lives by herself, uses a cane or walker to get around.  This morning she had a fall around 10:30 AM.  Subsequently she fell again at 5:30 PM once the family left.  Patient states that her legs gave out, she fell and was unable to get up.  She was on the floor for couple of hours.  Family came to get her up.  According to the daughter, when she left within minutes patient fell again.   Patient states that she has had some URI-like symptoms the last few days.  She has been having chills, but no fevers.  She is having headaches.  Also she has no appetite.  Home Medications Prior to Admission medications   Medication Sig Start Date End Date Taking? Authorizing Provider  acetaminophen (TYLENOL) 325 MG tablet Take 650 mg by mouth every 4 (four) hours as needed.    [provider]  amLODipine (NORVASC) 10 MG tablet Take 5 mg by mouth daily.    [provider]  Azelastine HCl 0.15 % SOLN Place 1 spray into both nostrils daily. 08/19/21   [provider]  Blood Glucose Monitoring Suppl (ONETOUCH VERIO) w/Device KIT Use to check glucose twice daily, use as backup to CGM 08/21/23   Dani Gobble, NP  Continuous Blood Gluc Sensor (FREESTYLE LIBRE 2 SENSOR) MISC 2 each by Does not apply route every 14 (fourteen) days. 08/04/22   Dani Gobble, NP  Continuous Glucose Receiver (FREESTYLE LIBRE 2 READER) DEVI USE TO CHECK GLUCOSE AS DIRECTED 11/01/22   Roma Kayser, MD  Continuous Glucose Receiver (FREESTYLE LIBRE 3 READER) DEVI Use the Reader  to monitor your blood sugar readings daily 01/16/23   Dani Gobble, NP  Continuous Glucose Sensor (FREESTYLE LIBRE 14 DAY SENSOR) MISC APPLY NEW SENSOR EVERY 14 DAYS *NEW PRESCRIPTION REQUEST* 07/10/23   Dani Gobble, NP  Continuous Glucose Sensor (FREESTYLE LIBRE 3 PLUS SENSOR) MISC Change sensor every 15 days. 07/12/23   Dani Gobble, NP  Continuous Glucose Sensor (FREESTYLE LIBRE 3 SENSOR) MISC 1 Application by Does not apply route every 14 (fourteen) days. 01/16/23   Dani Gobble, NP  Dulaglutide (TRULICITY) 3 MG/0.5ML SOAJ Inject 3 mg into the skin once a week. 08/21/23   Dani Gobble, NP  fluticasone (FLOVENT HFA) 220 MCG/ACT inhaler Inhale 1 puff into the lungs 2 (two) times daily. 07/24/22   [provider]  fluticasone furoate-vilanterol (BREO ELLIPTA) 100-25 MCG/ACT AEPB Inhale 1 puff into the lungs daily. 08/21/23   Dani Gobble, NP  furosemide (LASIX) 20 MG tablet Take 1 tablet (20 mg total) by mouth every other day. Takes 40 mg every other day. On alternate days take 80 mg Patient taking differently: Take 20 mg by mouth 2 (two) times daily. 04/18/21   Johnson, Clanford L, MD  hydrALAZINE (APRESOLINE) 50 MG tablet Take 50 mg by mouth 2 (two) times daily.    [provider]  HYDROcodone-acetaminophen (NORCO) 5-325 MG tablet Take 1 tablet by mouth 2 (two)  times daily as needed. 07/18/22   Tarry Kos, MD  Insulin Degludec (TRESIBA) 100 UNIT/ML SOLN Inject 20 Units into the skin daily. 10/06/20   [provider]  insulin glargine (LANTUS SOLOSTAR) 100 UNIT/ML Solostar Moreno INJECT 35 UNITS INTO THE SKIN AT BEDTIME *NEW PRESCRIPTION REQUEST* Patient not taking: Reported on 08/21/2023 07/10/23   Dani Gobble, NP  Insulin Moreno Needle (BD Moreno NEEDLE MICRO U/F) 32G X 6 MM MISC USE AS DIRECTED ONCE DAILY *NEW PRESCRIPTION REQUEST* 07/10/23   Dani Gobble, NP  Insulin Moreno Needle 31G X 6 MM MISC 1 each by Does not apply route daily. 04/18/23    Dani Gobble, NP  levocetirizine (XYZAL) 5 MG tablet TAKE 1 TABLET BY MOUTH EVERY EVENING AS NEEDED FOR ALLERGIES 07/10/23   Gelene Mink, NP  lidocaine (LIDODERM) 5 % Place 1 patch onto the skin daily.    [provider]  losartan (COZAAR) 25 MG tablet Take 25 mg by mouth daily.    [provider]  lubiprostone (AMITIZA) 8 MCG capsule Take 1 capsule (8 mcg total) by mouth 2 (two) times daily with a meal. 06/20/23   Gelene Mink, NP  meloxicam (MOBIC) 7.5 MG tablet Take 1 tablet (7.5 mg total) by mouth 2 (two) times daily. 07/25/23   Vickki Hearing, MD  mirabegron ER (MYRBETRIQ) 50 MG TB24 tablet TAKE 1 TABLET BY MOUTH EVERY DAY AT 9 AM *NEW PRESCRIPTION REQUEST* 07/18/23   McKenzie, Mardene Celeste, MD  mirabegron ER (MYRBETRIQ) 50 MG TB24 tablet TAKE 1 TABLET BY MOUTH DAILY AT  9 AM 07/09/23   McKenzie, Mardene Celeste, MD  omeprazole (PRILOSEC) 40 MG capsule Take 1 capsule (40 mg total) by mouth daily before breakfast. 06/20/23   Gelene Mink, NP  ondansetron (ZOFRAN) 4 MG tablet TAKE 1 TABLET BY MOUTH EVERY 8 HOURS AS NEEDED FOR NAUSEA AND VOMITING Patient taking differently: Take 4 mg by mouth every 8 (eight) hours as needed for nausea or vomiting. 11/28/22   Gelene Mink, NP  ondansetron (ZOFRAN) 4 MG tablet Take 1 tablet (4 mg total) by mouth every 8 (eight) hours as needed for nausea or vomiting. 02/21/23   Gelene Mink, NP  OVER THE COUNTER MEDICATION Prevagine one per day for memory.    [provider]  pantoprazole (PROTONIX) 40 MG tablet Take 1 tablet (40 mg total) by mouth 2 (two) times daily before a meal. 05/21/23   Gelene Mink, NP  rosuvastatin (CRESTOR) 10 MG tablet TAKE 1 TABLET BY MOUTH DAILY AT  9 PM (AT BEDTIME) 10/30/22   [provider]  sucralfate (CARAFATE) 1 g tablet Take 1 tablet (1 g total) by mouth 4 (four) times daily -  with meals and at bedtime. 05/07/23   Gelene Mink, NP      Allergies    Codeine, Carvedilol, Hydrochlorothiazide,  Lisinopril, Oxybutynin, and Penicillins    Review of Systems   Review of Systems  All other systems reviewed and are negative.   Physical Exam Updated Vital Signs BP (!) 156/81 (BP Location: Right Arm)   Pulse 98   Temp 100.2 F (37.9 C) (Oral)   Resp (!) 24   SpO2 94%  Physical Exam Vitals and nursing note reviewed.  Constitutional:      Appearance: She is well-developed.  HENT:     Head: Atraumatic.  Cardiovascular:     Rate and Rhythm: Normal rate.  Pulmonary:  Effort: Pulmonary effort is normal.     Breath sounds: No wheezing.  Musculoskeletal:        General: No swelling, tenderness or deformity.     Cervical back: Normal range of motion and neck supple.  Skin:    General: Skin is warm and dry.  Neurological:     Mental Status: She is alert and oriented to person, place, and time.     ED Results / Procedures / Treatments   Labs (all labs ordered are listed, but only abnormal results are displayed) Labs Reviewed  RESP PANEL BY RT-PCR (RSV, FLU A&B, COVID)  RVPGX2 - Abnormal; Notable for the following components:      Result Value   SARS Coronavirus 2 by RT PCR POSITIVE (*)    All other components within normal limits  BASIC METABOLIC PANEL - Abnormal; Notable for the following components:   Potassium 3.0 (*)    CO2 20 (*)    Glucose, Bld 111 (*)    Creatinine, Ser 1.51 (*)    GFR, Estimated 35 (*)    All other components within normal limits  URINALYSIS, ROUTINE W REFLEX MICROSCOPIC - Abnormal; Notable for the following components:   Hgb urine dipstick MODERATE (*)    Protein, ur 100 (*)    All other components within normal limits  CK - Abnormal; Notable for the following components:   Total CK 896 (*)    All other components within normal limits  CBC    EKG EKG Interpretation Date/Time:  Monday August 27 2023 21:35:17 EST Ventricular Rate:  101 PR Interval:  189 QRS Duration:  93 QT Interval:  338 QTC Calculation: 439 R  Axis:   83  Text Interpretation: Sinus tachycardia Borderline right axis deviation Minimal ST depression, inferior leads Minimal ST elevation, lateral leads Nonspecific ST and T wave abnormality Confirmed by Derwood Kaplan [phone]) on 08/28/2023 12:23:33 AM  Radiology No results found.  Procedures Procedures    Medications Ordered in ED Medications  lactated ringers infusion (has no administration in time range)    ED Course/ Medical Decision Making/ A&P                                 Medical Decision Making Amount and/or Complexity of Data Reviewed Labs: ordered. Radiology: ordered.  Risk Decision regarding hospitalization.   78 year old patient comes in after sustaining what appears to be a mechanical fall. Pertinent past medical includes diabetes, CKD Collateral history provided by patient's daughter  Based on my history and exam, differential diagnosis includes: - Traumatic brain injury including intracranial hemorrhage - Long bone fractures - Contusions - Soft tissue injury - Concussion  From nontrauma perspective, differential diagnosis includes severe anemia, electrolyte abnormality, acute on chronic renal failure, flu, COVID, acute UTI.  Based on the initial assessment, the following workup was initiated basic labs, flu/COVID test, CK and CT scan of the brain along with x-ray of the chest.  Patient's musculoskeletal exam is reassuring.  I have independently interpreted the following imaging from the perspective of acute trauma: X-ray of the chest and the results indicate no evidence of pneumothorax.  Patient's COVID-19 test is positive.  Given her profound weakness, we will proceed with admission.  Patient is tachypneic, tachycardic but not hypoxic.  Will not give any dexamethasone.   Final Clinical Impression(s) / ED Diagnoses Final diagnoses:  COVID-19  Fall, initial encounter  Generalized weakness  Rx / DC Orders ED Discharge Orders     None          Derwood Kaplan, MD 08/28/23 0025

## 2023-08-27 NOTE — H&P (Signed)
 History and Physical    Patient: Stephanie Moreno:[id number] DOB: 1945-09-01 DOA: 08/27/2023 DOS: the patient was seen and examined on 08/28/2023 PCP: Benita Stabile, MD  Patient coming from: Home  Chief Complaint:  Chief Complaint  Patient presents with   Fall   HPI: Stephanie Moreno is a 78 y.o. female with medical history significant of hypertension, type 2 diabetes mellitus, CKD 3B, GERD who presents to the emergency department from home via EMS due to falling at home..  Patient complained of several days of onset of not feeling well, she complained of cough, generalized weakness, decreased appetite.  She complained of sustaining a fall at home this morning around 10:30 AM without hurting self due to her legs giving out, she called her niece who activated EMS, but patient refused to go to the hospital at that time.  She later on sustained another fall around 5:30 PM and was on the floor for about 2 hours, she denies loss of consciousness or hitting her head.  Daughter then insisted that she needs to go to the ED for further evaluation and management.  EMS was activated and she was taken to the ED.  ED Course:  In the emergency department, patient was tachycardic and tachypneic, BP was 140/65, other vital signs are within normal range.  Workup in the ED showed normal CBC.  BMP was normal except for potassium of 3.0, bicarb 20, blood glucose 111, creatinine 1.51 (creatinine is within baseline range).  Urinalysis was normal, total CK was 896.  SARS coronavirus 2 was positive.  Influenza A, B, RSV was negative. Chest x-ray showed mild right lower lobe opacity, suspicious for pneumonia CT head without contrast showed no acute intraocular abnormality. Hospitalist was asked to admit patient for further evaluation and management.  Review of Systems: Review of systems as noted in the HPI. All other systems reviewed and are negative.   Past Medical History:  Diagnosis Date   Acid reflux disease     Anemia    Anxiety    Asthma    Breast cancer (HCC)    left side   Chronic back pain    Chronic kidney disease    Depression    Diabetes mellitus without complication (HCC)    Diverticulitis    Hypertension    Lumbosacral spondylosis    Per previous PCP records   Sleep apnea    Thyroid disease    Past Surgical History:  Procedure Laterality Date   ABDOMINAL HYSTERECTOMY     BACK SURGERY     BALLOON DILATION N/A 04/24/2022   Procedure: BALLOON DILATION;  Surgeon: Lanelle Bal, DO;  Location: [facility];  Service: Endoscopy;  Laterality: N/A;   BIOPSY  02/19/2020   Procedure: BIOPSY;  Surgeon: Lanelle Bal, DO;  Location: [facility];  Service: Endoscopy;;   BIOPSY  04/24/2022   Procedure: BIOPSY;  Surgeon: Lanelle Bal, DO;  Location: [facility];  Service: Endoscopy;;   BREAST BIOPSY Right    x 3   BREAST LUMPECTOMY Left 2003   COLONOSCOPY WITH PROPOFOL N/A 02/19/2020   non-bleeding internal hemorrhoids, many small-mouthed diverticula in entire colon.   ESOPHAGOGASTRODUODENOSCOPY (EGD) WITH PROPOFOL N/A 02/19/2020    benign-appearing esophageal stenosis s/p dilation, gastritis s/p biopsy, normal duodenum. Negative H.pylori, negative duodenal biopsy.    ESOPHAGOGASTRODUODENOSCOPY (EGD) WITH PROPOFOL N/A 04/24/2022   small hiatal hernia, mild Schatzki ring, s/p dilation. Gastritis s/p biopsy. Negative H.pylori.   GIVENS CAPSULE STUDY  N/A 06/14/2020   Procedure: GIVENS CAPSULE STUDY;  Surgeon: Lanelle Bal, DO;  Location: [facility];  Service: Endoscopy;  Laterality: N/A;  7:30am   THYROIDECTOMY     tummy tuck     at time of breast surgery 2003    Social History:  reports that she has never smoked. She has never used smokeless tobacco. She reports that she does not currently use alcohol. She reports that she does not currently use drugs.   Allergies  Allergen Reactions   Codeine Nausea And Vomiting and Anaphylaxis   Carvedilol     Cold  sweats, diarrhea, severe hot flashes   Hydrochlorothiazide Other (See Comments)    Other reaction(s): stomach upset   Lisinopril Nausea And Vomiting    Other reaction(s): cough   Oxybutynin Other (See Comments)   Penicillins Hives    Family History  Problem Relation Age of Onset   Diabetes Mother    Thyroid disease Mother    Colon cancer Neg Hx      Prior to Admission medications   Medication Sig Start Date End Date Taking? Authorizing Provider  acetaminophen (TYLENOL) 325 MG tablet Take 650 mg by mouth every 4 (four) hours as needed.    [provider]  amLODipine (NORVASC) 10 MG tablet Take 5 mg by mouth daily.    [provider]  Azelastine HCl 0.15 % SOLN Place 1 spray into both nostrils daily. 08/19/21   [provider]  Blood Glucose Monitoring Suppl (ONETOUCH VERIO) w/Device KIT Use to check glucose twice daily, use as backup to CGM 08/21/23   Dani Gobble, NP  Continuous Blood Gluc Sensor (FREESTYLE LIBRE 2 SENSOR) MISC 2 each by Does not apply route every 14 (fourteen) days. 08/04/22   Dani Gobble, NP  Continuous Glucose Receiver (FREESTYLE LIBRE 2 READER) DEVI USE TO CHECK GLUCOSE AS DIRECTED 11/01/22   Roma Kayser, MD  Continuous Glucose Receiver (FREESTYLE LIBRE 3 READER) DEVI Use the Reader to monitor your blood sugar readings daily 01/16/23   Dani Gobble, NP  Continuous Glucose Sensor (FREESTYLE LIBRE 14 DAY SENSOR) MISC APPLY NEW SENSOR EVERY 14 DAYS *NEW PRESCRIPTION REQUEST* 07/10/23   Dani Gobble, NP  Continuous Glucose Sensor (FREESTYLE LIBRE 3 PLUS SENSOR) MISC Change sensor every 15 days. 07/12/23   Dani Gobble, NP  Continuous Glucose Sensor (FREESTYLE LIBRE 3 SENSOR) MISC 1 Application by Does not apply route every 14 (fourteen) days. 01/16/23   Dani Gobble, NP  Dulaglutide (TRULICITY) 3 MG/0.5ML SOAJ Inject 3 mg into the skin once a week. 08/21/23   Dani Gobble, NP  fluticasone (FLOVENT HFA)  220 MCG/ACT inhaler Inhale 1 puff into the lungs 2 (two) times daily. 07/24/22   [provider]  fluticasone furoate-vilanterol (BREO ELLIPTA) 100-25 MCG/ACT AEPB Inhale 1 puff into the lungs daily. 08/21/23   Dani Gobble, NP  furosemide (LASIX) 20 MG tablet Take 1 tablet (20 mg total) by mouth every other day. Takes 40 mg every other day. On alternate days take 80 mg Patient taking differently: Take 20 mg by mouth 2 (two) times daily. 04/18/21   Johnson, Clanford L, MD  hydrALAZINE (APRESOLINE) 50 MG tablet Take 50 mg by mouth 2 (two) times daily.    [provider]  HYDROcodone-acetaminophen (NORCO) 5-325 MG tablet Take 1 tablet by mouth 2 (two) times daily as needed. 07/18/22   Tarry Kos, MD  Insulin Degludec (TRESIBA) 100 UNIT/ML SOLN Inject  20 Units into the skin daily. 10/06/20   [provider]  insulin glargine (LANTUS SOLOSTAR) 100 UNIT/ML Solostar Pen INJECT 35 UNITS INTO THE SKIN AT BEDTIME *NEW PRESCRIPTION REQUEST* Patient not taking: Reported on 08/21/2023 07/10/23   Dani Gobble, NP  Insulin Pen Needle (BD PEN NEEDLE MICRO U/F) 32G X 6 MM MISC USE AS DIRECTED ONCE DAILY *NEW PRESCRIPTION REQUEST* 07/10/23   Dani Gobble, NP  Insulin Pen Needle 31G X 6 MM MISC 1 each by Does not apply route daily. 04/18/23   Dani Gobble, NP  levocetirizine (XYZAL) 5 MG tablet TAKE 1 TABLET BY MOUTH EVERY EVENING AS NEEDED FOR ALLERGIES 07/10/23   Gelene Mink, NP  lidocaine (LIDODERM) 5 % Place 1 patch onto the skin daily.    [provider]  losartan (COZAAR) 25 MG tablet Take 25 mg by mouth daily.    [provider]  lubiprostone (AMITIZA) 8 MCG capsule Take 1 capsule (8 mcg total) by mouth 2 (two) times daily with a meal. 06/20/23   Gelene Mink, NP  meloxicam (MOBIC) 7.5 MG tablet Take 1 tablet (7.5 mg total) by mouth 2 (two) times daily. 07/25/23   Vickki Hearing, MD  mirabegron ER (MYRBETRIQ) 50 MG TB24 tablet TAKE 1 TABLET BY  MOUTH EVERY DAY AT 9 AM *NEW PRESCRIPTION REQUEST* 07/18/23   McKenzie, Mardene Celeste, MD  mirabegron ER (MYRBETRIQ) 50 MG TB24 tablet TAKE 1 TABLET BY MOUTH DAILY AT  9 AM 07/09/23   McKenzie, Mardene Celeste, MD  omeprazole (PRILOSEC) 40 MG capsule Take 1 capsule (40 mg total) by mouth daily before breakfast. 06/20/23   Gelene Mink, NP  ondansetron (ZOFRAN) 4 MG tablet TAKE 1 TABLET BY MOUTH EVERY 8 HOURS AS NEEDED FOR NAUSEA AND VOMITING Patient taking differently: Take 4 mg by mouth every 8 (eight) hours as needed for nausea or vomiting. 11/28/22   Gelene Mink, NP  ondansetron (ZOFRAN) 4 MG tablet Take 1 tablet (4 mg total) by mouth every 8 (eight) hours as needed for nausea or vomiting. 02/21/23   Gelene Mink, NP  OVER THE COUNTER MEDICATION Prevagine one per day for memory.    [provider]  pantoprazole (PROTONIX) 40 MG tablet Take 1 tablet (40 mg total) by mouth 2 (two) times daily before a meal. 05/21/23   Gelene Mink, NP  rosuvastatin (CRESTOR) 10 MG tablet TAKE 1 TABLET BY MOUTH DAILY AT  9 PM (AT BEDTIME) 10/30/22   [provider]  sucralfate (CARAFATE) 1 g tablet Take 1 tablet (1 g total) by mouth 4 (four) times daily -  with meals and at bedtime. 05/07/23   Gelene Mink, NP    Physical Exam: BP (!) 166/85   Pulse 96   Temp 99.5 F (37.5 C) (Oral)   Resp (!) 27   SpO2 92%   General: 78 y.o. year-old female well developed well nourished in no acute distress.  Alert and oriented x3. HEENT: NCAT, EOMI Neck: Supple, trachea medial Cardiovascular: Regular rate and rhythm with no rubs or gallops.  No thyromegaly or JVD noted.  No lower extremity edema. 2/4 pulses in all 4 extremities. Respiratory: Clear to auscultation with no wheezes or rales. Good inspiratory effort. Abdomen: Soft, nontender nondistended with normal bowel sounds x4 quadrants. Muskuloskeletal: No cyanosis, clubbing or edema noted bilaterally Neuro: CN II-XII intact, strength 5/5 x 4, sensation,  reflexes intact Skin: No ulcerative lesions noted or rashes Psychiatry: Judgement  and insight appear normal. Mood is appropriate for condition and setting          Labs on Admission:  Basic Metabolic Panel: Recent Labs  Lab 08/27/23 2148  NA 139  K 3.0*  CL 106  CO2 20*  GLUCOSE 111*  BUN 16  CREATININE 1.51*  CALCIUM 9.3   Liver Function Tests: No results for input(s): "AST", "ALT", "ALKPHOS", "BILITOT", "PROT", "ALBUMIN" in the last 168 hours. No results for input(s): "LIPASE", "AMYLASE" in the last 168 hours. No results for input(s): "AMMONIA" in the last 168 hours. CBC: Recent Labs  Lab 08/27/23 2148  WBC 8.1  HGB 12.2  HCT 37.1  MCV 84.1  PLT 253   Cardiac Enzymes: Recent Labs  Lab 08/27/23 2148  CKTOTAL 896*    BNP (last 3 results) No results for input(s): "BNP" in the last 8760 hours.  ProBNP (last 3 results) No results for input(s): "PROBNP" in the last 8760 hours.  CBG: No results for input(s): "GLUCAP" in the last 168 hours.  Radiological Exams on Admission: DG Chest Port 1 View Result Date: 08/28/2023 CLINICAL DATA:  COVID positive, cough EXAM: PORTABLE CHEST 1 VIEW COMPARISON:  None Available. FINDINGS: Mild right lower lobe opacity, suspicious for pneumonia. Left lung is clear. No pleural effusion or pneumothorax. The heart is normal in size. IMPRESSION: Mild right lower lobe opacity, suspicious for pneumonia. Electronically Signed   By: Charline Bills M.D.   On: 08/28/2023 00:45   CT Head Wo Contrast Result Date: 08/28/2023 CLINICAL DATA:  Multiple falls, weakness, headache EXAM: CT HEAD WITHOUT CONTRAST TECHNIQUE: Contiguous axial images were obtained from the base of the skull through the vertex without intravenous contrast. RADIATION DOSE REDUCTION: This exam was performed according to the departmental dose-optimization program which includes automated exposure control, adjustment of the mA and/or kV according to patient size and/or use of  iterative reconstruction technique. COMPARISON:  03/16/2022 FINDINGS: Brain: No evidence of acute infarction, hemorrhage, hydrocephalus, extra-axial collection or mass lesion/mass effect. Subcortical white matter and periventricular small vessel ischemic changes. Vascular: Intracranial atherosclerosis. Skull: Normal. Negative for fracture or focal lesion. Sinuses/Orbits: The visualized paranasal sinuses are essentially clear. The mastoid air cells are unopacified. Other: None. IMPRESSION: No acute intracranial abnormality. Small vessel ischemic changes. Electronically Signed   By: Charline Bills M.D.   On: 08/28/2023 00:38    EKG: I independently viewed the EKG done and my findings are as followed: Sinus tachycardia at a rate of 101 bpm  Assessment/Plan Present on Admission:  COVID-19 virus infection  Principal Problem:   COVID-19 virus infection Active Problems:   Hypokalemia   Recurrent falls   Elevated CK   Obesity, Class I, BMI 30-34.9  COVID-19 virus pneumonia SARS coronavirus 2 was positive Chest x-ray was suggestive of pneumonia Procalcitonin will be checked to ensure no superimposed bacterial pneumonia Continue albuterol q.6h Continue Mucinex, Robitussin  Continue Tylenol p.r.n. for fever Continue incentive spirometry Patient was not requiring oxygen at this time and was in no acute distress Continue symptomatic treatment at this time  Hypokalemia K+ 3.0, this will be replenished  Recurrent falls Continue fall precaution Continue PT/OT eval and treat  Elevated CK Total CK 896 Continue IV hydration and continue to monitor CK levels  Obesity class I (BMI 30.29) Diet and lifestyle modification  DVT prophylaxis: Lovenox  Code Status: Full code  Family Communication: None at bedside  Consults: None  Severity of Illness: The appropriate patient status for this patient is INPATIENT. Inpatient  status is judged to be reasonable and necessary in order to provide  the required intensity of service to ensure the patient's safety. The patient's presenting symptoms, physical exam findings, and initial radiographic and laboratory data in the context of their chronic comorbidities is felt to place them at high risk for further clinical deterioration. Furthermore, it is not anticipated that the patient will be medically stable for discharge from the hospital within 2 midnights of admission.   * I certify that at the point of admission it is my clinical judgment that the patient will require inpatient hospital care spanning beyond 2 midnights from the point of admission due to high intensity of service, high risk for further deterioration and high frequency of surveillance required.*  Author: Frankey Shown, DO 08/28/2023 5:25 AM  For on call review [email or web address].

## 2023-08-27 NOTE — ED Triage Notes (Signed)
 Pt bib [facility] from home. Pt states she has had multiple falls today. Denies any injury, no LOC. Reports she has been feeling weak since yesterday. C/o chills, headache and fatigue. Pt A&O.

## 2023-08-27 NOTE — ED Notes (Signed)
 Patient transported to CT

## 2023-08-27 NOTE — ED Provider Notes (Incomplete)
 [place] EMERGENCY DEPARTMENT AT [facility] Provider Note   CSN: [id number] Arrival date & time: 08/27/23  2050     History {Add pertinent medical, surgical, social history, OB history to HPI:1} Chief Complaint  Patient presents with  . Fall    Stephanie Moreno is a 78 y.o. female.  HPI     78 year old female comes in with chief complaint of mechanical fall. Patient accompanied by daughter.  Patient has previous history of diabetes, hypertension, hyperlipidemia and UTI.  Home Medications Prior to Admission medications   Medication Sig Start Date End Date Taking? Authorizing Provider  acetaminophen (TYLENOL) 325 MG tablet Take 650 mg by mouth every 4 (four) hours as needed.    [provider]  amLODipine (NORVASC) 10 MG tablet Take 5 mg by mouth daily.    [provider]  Azelastine HCl 0.15 % SOLN Place 1 spray into both nostrils daily. 08/19/21   [provider]  Blood Glucose Monitoring Suppl (ONETOUCH VERIO) w/Device KIT Use to check glucose twice daily, use as backup to CGM 08/21/23   Dani Gobble, NP  Continuous Blood Gluc Sensor (FREESTYLE LIBRE 2 SENSOR) MISC 2 each by Does not apply route every 14 (fourteen) days. 08/04/22   Dani Gobble, NP  Continuous Glucose Receiver (FREESTYLE LIBRE 2 READER) DEVI USE TO CHECK GLUCOSE AS DIRECTED 11/01/22   Roma Kayser, MD  Continuous Glucose Receiver (FREESTYLE LIBRE 3 READER) DEVI Use the Reader to monitor your blood sugar readings daily 01/16/23   Dani Gobble, NP  Continuous Glucose Sensor (FREESTYLE LIBRE 14 DAY SENSOR) MISC APPLY NEW SENSOR EVERY 14 DAYS *NEW PRESCRIPTION REQUEST* 07/10/23   Dani Gobble, NP  Continuous Glucose Sensor (FREESTYLE LIBRE 3 PLUS SENSOR) MISC Change sensor every 15 days. 07/12/23   Dani Gobble, NP  Continuous Glucose Sensor (FREESTYLE LIBRE 3 SENSOR) MISC 1 Application by Does not apply route every 14 (fourteen) days. 01/16/23    Dani Gobble, NP  Dulaglutide (TRULICITY) 3 MG/0.5ML SOAJ Inject 3 mg into the skin once a week. 08/21/23   Dani Gobble, NP  fluticasone (FLOVENT HFA) 220 MCG/ACT inhaler Inhale 1 puff into the lungs 2 (two) times daily. 07/24/22   [provider]  fluticasone furoate-vilanterol (BREO ELLIPTA) 100-25 MCG/ACT AEPB Inhale 1 puff into the lungs daily. 08/21/23   Dani Gobble, NP  furosemide (LASIX) 20 MG tablet Take 1 tablet (20 mg total) by mouth every other day. Takes 40 mg every other day. On alternate days take 80 mg Patient taking differently: Take 20 mg by mouth 2 (two) times daily. 04/18/21   Johnson, Clanford L, MD  hydrALAZINE (APRESOLINE) 50 MG tablet Take 50 mg by mouth 2 (two) times daily.    [provider]  HYDROcodone-acetaminophen (NORCO) 5-325 MG tablet Take 1 tablet by mouth 2 (two) times daily as needed. 07/18/22   Tarry Kos, MD  Insulin Degludec (TRESIBA) 100 UNIT/ML SOLN Inject 20 Units into the skin daily. 10/06/20   [provider]  insulin glargine (LANTUS SOLOSTAR) 100 UNIT/ML Solostar Pen INJECT 35 UNITS INTO THE SKIN AT BEDTIME *NEW PRESCRIPTION REQUEST* Patient not taking: Reported on 08/21/2023 07/10/23   Dani Gobble, NP  Insulin Pen Needle (BD PEN NEEDLE MICRO U/F) 32G X 6 MM MISC USE AS DIRECTED ONCE DAILY *NEW PRESCRIPTION REQUEST* 07/10/23   Dani Gobble, NP  Insulin Pen Needle 31G X 6 MM MISC 1 each by Does not  apply route daily. 04/18/23   Dani Gobble, NP  levocetirizine (XYZAL) 5 MG tablet TAKE 1 TABLET BY MOUTH EVERY EVENING AS NEEDED FOR ALLERGIES 07/10/23   Gelene Mink, NP  lidocaine (LIDODERM) 5 % Place 1 patch onto the skin daily.    [provider]  losartan (COZAAR) 25 MG tablet Take 25 mg by mouth daily.    [provider]  lubiprostone (AMITIZA) 8 MCG capsule Take 1 capsule (8 mcg total) by mouth 2 (two) times daily with a meal. 06/20/23   Gelene Mink, NP  meloxicam (MOBIC) 7.5 MG  tablet Take 1 tablet (7.5 mg total) by mouth 2 (two) times daily. 07/25/23   Vickki Hearing, MD  mirabegron ER (MYRBETRIQ) 50 MG TB24 tablet TAKE 1 TABLET BY MOUTH EVERY DAY AT 9 AM *NEW PRESCRIPTION REQUEST* 07/18/23   McKenzie, Mardene Celeste, MD  mirabegron ER (MYRBETRIQ) 50 MG TB24 tablet TAKE 1 TABLET BY MOUTH DAILY AT  9 AM 07/09/23   McKenzie, Mardene Celeste, MD  omeprazole (PRILOSEC) 40 MG capsule Take 1 capsule (40 mg total) by mouth daily before breakfast. 06/20/23   Gelene Mink, NP  ondansetron (ZOFRAN) 4 MG tablet TAKE 1 TABLET BY MOUTH EVERY 8 HOURS AS NEEDED FOR NAUSEA AND VOMITING Patient taking differently: Take 4 mg by mouth every 8 (eight) hours as needed for nausea or vomiting. 11/28/22   Gelene Mink, NP  ondansetron (ZOFRAN) 4 MG tablet Take 1 tablet (4 mg total) by mouth every 8 (eight) hours as needed for nausea or vomiting. 02/21/23   Gelene Mink, NP  OVER THE COUNTER MEDICATION Prevagine one per day for memory.    [provider]  pantoprazole (PROTONIX) 40 MG tablet Take 1 tablet (40 mg total) by mouth 2 (two) times daily before a meal. 05/21/23   Gelene Mink, NP  rosuvastatin (CRESTOR) 10 MG tablet TAKE 1 TABLET BY MOUTH DAILY AT  9 PM (AT BEDTIME) 10/30/22   [provider]  sucralfate (CARAFATE) 1 g tablet Take 1 tablet (1 g total) by mouth 4 (four) times daily -  with meals and at bedtime. 05/07/23   Gelene Mink, NP      Allergies    Codeine, Carvedilol, Hydrochlorothiazide, Lisinopril, Oxybutynin, and Penicillins    Review of Systems   Review of Systems  Physical Exam Updated Vital Signs BP (!) 140/65   Pulse 98   Temp 99.7 F (37.6 C)   Resp 16   SpO2 94%  Physical Exam  ED Results / Procedures / Treatments   Labs (all labs ordered are listed, but only abnormal results are displayed) Labs Reviewed  RESP PANEL BY RT-PCR (RSV, FLU A&B, COVID)  RVPGX2 - Abnormal; Notable for the following components:      Result Value   SARS Coronavirus 2  by RT PCR POSITIVE (*)    All other components within normal limits  BASIC METABOLIC PANEL - Abnormal; Notable for the following components:   Potassium 3.0 (*)    CO2 20 (*)    Glucose, Bld 111 (*)    Creatinine, Ser 1.51 (*)    GFR, Estimated 35 (*)    All other components within normal limits  CBC  URINALYSIS, ROUTINE W REFLEX MICROSCOPIC    EKG None  Radiology No results found.  Procedures Procedures  {Document cardiac monitor, telemetry assessment procedure when appropriate:1}  Medications Ordered in ED Medications - No data to display  ED Course/  Medical Decision Making/ A&P   {   Click here for ABCD2, HEART and other calculatorsREFRESH Note before signing :1}                              Medical Decision Making Amount and/or Complexity of Data Reviewed Labs: ordered. Radiology: ordered.  Risk Decision regarding hospitalization.   ***  {Document critical care time when appropriate:1} {Document review of labs and clinical decision tools ie heart score, Chads2Vasc2 etc:1}  {Document your independent review of radiology images, and any outside records:1} {Document your discussion with family members, caretakers, and with consultants:1} {Document social determinants of health affecting pt's care:1} {Document your decision making why or why not admission, treatments were needed:1} Final Clinical Impression(s) / ED Diagnoses Final diagnoses:  COVID-19  Fall, initial encounter  Generalized weakness    Rx / DC Orders ED Discharge Orders     None

## 2023-08-28 ENCOUNTER — Encounter (HOSPITAL_COMMUNITY): Payer: Self-pay | Admitting: Internal Medicine

## 2023-08-28 DIAGNOSIS — R748 Abnormal levels of other serum enzymes: Secondary | ICD-10-CM | POA: Insufficient documentation

## 2023-08-28 DIAGNOSIS — U071 COVID-19: Secondary | ICD-10-CM | POA: Diagnosis not present

## 2023-08-28 DIAGNOSIS — E876 Hypokalemia: Secondary | ICD-10-CM | POA: Insufficient documentation

## 2023-08-28 DIAGNOSIS — E66811 Obesity, class 1: Secondary | ICD-10-CM | POA: Insufficient documentation

## 2023-08-28 DIAGNOSIS — R296 Repeated falls: Secondary | ICD-10-CM

## 2023-08-28 LAB — COMPREHENSIVE METABOLIC PANEL
ALT: 20 U/L (ref 0–44)
AST: 33 U/L (ref 15–41)
Albumin: 3.6 g/dL (ref 3.5–5.0)
Alkaline Phosphatase: 51 U/L (ref 38–126)
Anion gap: 11 (ref 5–15)
BUN: 14 mg/dL (ref 8–23)
CO2: 22 mmol/L (ref 22–32)
Calcium: 8.9 mg/dL (ref 8.9–10.3)
Chloride: 105 mmol/L (ref 98–111)
Creatinine, Ser: 1.29 mg/dL — ABNORMAL HIGH (ref 0.44–1.00)
GFR, Estimated: 43 mL/min — ABNORMAL LOW (ref >60)
Glucose, Bld: 107 mg/dL — ABNORMAL HIGH (ref 70–99)
Potassium: 2.7 mmol/L — CL (ref 3.5–5.1)
Sodium: 138 mmol/L (ref 135–145)
Total Bilirubin: 0.7 mg/dL (ref 0.0–1.2)
Total Protein: 7.5 g/dL (ref 6.5–8.1)

## 2023-08-28 LAB — MAGNESIUM: Magnesium: 1.5 mg/dL — ABNORMAL LOW (ref 1.7–2.4)

## 2023-08-28 LAB — GLUCOSE, CAPILLARY
Glucose-Capillary: 115 mg/dL — ABNORMAL HIGH (ref 70–99)
Glucose-Capillary: 115 mg/dL — ABNORMAL HIGH (ref 70–99)

## 2023-08-28 LAB — CBC
HCT: 35.9 % — ABNORMAL LOW (ref 36.0–46.0)
Hemoglobin: 11.6 g/dL — ABNORMAL LOW (ref 12.0–15.0)
MCH: 27.3 pg (ref 26.0–34.0)
MCHC: 32.3 g/dL (ref 30.0–36.0)
MCV: 84.5 fL (ref 80.0–100.0)
Platelets: 243 10*3/uL (ref 150–400)
RBC: 4.25 MIL/uL (ref 3.87–5.11)
RDW: 13.8 % (ref 11.5–15.5)
WBC: 7.8 10*3/uL (ref 4.0–10.5)
nRBC: 0 % (ref 0.0–0.2)

## 2023-08-28 LAB — CK
Total CK: 896 U/L — ABNORMAL HIGH (ref 38–234)
Total CK: 916 U/L — ABNORMAL HIGH (ref 38–234)

## 2023-08-28 LAB — PHOSPHORUS: Phosphorus: 3.2 mg/dL (ref 2.5–4.6)

## 2023-08-28 LAB — PROCALCITONIN: Procalcitonin: 0.11 ng/mL

## 2023-08-28 MED ORDER — INSULIN ASPART 100 UNIT/ML IJ SOLN
0.0000 [IU] | Freq: Three times a day (TID) | INTRAMUSCULAR | Status: DC
  Administered 2023-08-29: 1 [IU] via SUBCUTANEOUS

## 2023-08-28 MED ORDER — MAGNESIUM SULFATE 2 GM/50ML IV SOLN
2.0000 g | Freq: Once | INTRAVENOUS | Status: AC
  Administered 2023-08-28: 2 g via INTRAVENOUS
  Filled 2023-08-28: qty 50

## 2023-08-28 MED ORDER — LACTATED RINGERS IV SOLN: INTRAVENOUS | Status: DC

## 2023-08-28 MED ORDER — INSULIN ASPART 100 UNIT/ML IJ SOLN
0.0000 [IU] | Freq: Every day | INTRAMUSCULAR | Status: DC
Start: 2023-08-28 — End: 2023-08-30

## 2023-08-28 MED ORDER — ONDANSETRON HCL 4 MG/2ML IJ SOLN: 4.0000 mg | Freq: Four times a day (QID) | INTRAMUSCULAR | Status: DC | PRN

## 2023-08-28 MED ORDER — ACETAMINOPHEN 325 MG PO TABS
650.0000 mg | ORAL_TABLET | Freq: Four times a day (QID) | ORAL | Status: DC | PRN
  Administered 2023-08-28 – 2023-08-30 (×6): 650 mg via ORAL
  Filled 2023-08-28 (×7): qty 2

## 2023-08-28 MED ORDER — POTASSIUM CHLORIDE CRYS ER 20 MEQ PO TBCR
40.0000 meq | EXTENDED_RELEASE_TABLET | Freq: Once | ORAL | Status: AC
  Administered 2023-08-28: 40 meq via ORAL
  Filled 2023-08-28: qty 2

## 2023-08-28 MED ORDER — GUAIFENESIN-DM 100-10 MG/5ML PO SYRP
5.0000 mL | ORAL_SOLUTION | ORAL | Status: DC | PRN
  Administered 2023-08-29: 5 mL via ORAL
  Filled 2023-08-28: qty 5

## 2023-08-28 MED ORDER — ALBUTEROL SULFATE (2.5 MG/3ML) 0.083% IN NEBU
2.5000 mg | INHALATION_SOLUTION | Freq: Four times a day (QID) | RESPIRATORY_TRACT | Status: DC
  Administered 2023-08-28 (×3): 2.5 mg via RESPIRATORY_TRACT
  Filled 2023-08-28 (×3): qty 3

## 2023-08-28 MED ORDER — POTASSIUM CHLORIDE 10 MEQ/100ML IV SOLN
10.0000 meq | INTRAVENOUS | Status: AC
Start: 2023-08-28 — End: 2023-08-28
  Administered 2023-08-28 (×4): 10 meq via INTRAVENOUS
  Filled 2023-08-28 (×5): qty 100

## 2023-08-28 MED ORDER — NIRMATRELVIR/RITONAVIR (PAXLOVID) TABLET (RENAL DOSING)
2.0000 | ORAL_TABLET | Freq: Two times a day (BID) | ORAL | Status: DC
  Administered 2023-08-28 – 2023-08-30 (×5): 2 via ORAL
  Filled 2023-08-28: qty 20

## 2023-08-28 MED ORDER — DM-GUAIFENESIN ER 30-600 MG PO TB12
1.0000 | ORAL_TABLET | Freq: Two times a day (BID) | ORAL | Status: DC
  Administered 2023-08-28 – 2023-08-30 (×5): 1 via ORAL
  Filled 2023-08-28 (×5): qty 1

## 2023-08-28 MED ORDER — ONDANSETRON HCL 4 MG PO TABS: 4.0000 mg | ORAL_TABLET | Freq: Four times a day (QID) | ORAL | Status: DC | PRN

## 2023-08-28 MED ORDER — ALBUTEROL SULFATE HFA 108 (90 BASE) MCG/ACT IN AERS: 2.0000 | INHALATION_SPRAY | Freq: Four times a day (QID) | RESPIRATORY_TRACT | Status: DC

## 2023-08-28 MED ORDER — INSULIN GLARGINE-YFGN 100 UNIT/ML ~~LOC~~ SOLN
5.0000 [IU] | Freq: Every day | SUBCUTANEOUS | Status: DC
  Administered 2023-08-28 – 2023-08-29 (×2): 5 [IU] via SUBCUTANEOUS
  Filled 2023-08-28 (×3): qty 0.05

## 2023-08-28 MED ORDER — ENOXAPARIN SODIUM 40 MG/0.4ML IJ SOSY
40.0000 mg | PREFILLED_SYRINGE | INTRAMUSCULAR | Status: DC
  Administered 2023-08-28 – 2023-08-30 (×3): 40 mg via SUBCUTANEOUS
  Filled 2023-08-28 (×3): qty 0.4

## 2023-08-28 MED ORDER — LACTATED RINGERS IV SOLN: INTRAVENOUS | Status: AC

## 2023-08-28 MED ORDER — ACETAMINOPHEN 650 MG RE SUPP: 650.0000 mg | Freq: Four times a day (QID) | RECTAL | Status: DC | PRN

## 2023-08-28 MED ORDER — ALBUTEROL SULFATE (2.5 MG/3ML) 0.083% IN NEBU
2.5000 mg | INHALATION_SOLUTION | Freq: Three times a day (TID) | RESPIRATORY_TRACT | Status: DC
  Administered 2023-08-29: 2.5 mg via RESPIRATORY_TRACT
  Filled 2023-08-28: qty 3

## 2023-08-28 NOTE — TOC CM/SW Note (Signed)
 Transition of Care [facility]) - Inpatient Brief Assessment   Patient Details  Name: Stephanie Moreno MRN: [id number] Date of Birth: 21-Aug-1945  Transition of Care [facility]) CM/SW Contact:    Villa Herb, LCSWA Phone Number: 08/28/2023, 4:08 PM   Clinical Narrative: Transition of Care Department [facility]) has reviewed patient and no TOC needs have been identified at this time. We will continue to monitor patient advancement through interdiciplinary progression rounds. If new patient transition needs arise, please place a TOC consult.   Transition of Care Asessment: Insurance and Status: Insurance coverage has been reviewed Patient has primary care physician: Yes Home environment has been reviewed: From home Prior level of function:: Independent Prior/Current Home Services: No current home services Social Drivers of Health Review: SDOH reviewed no interventions necessary Readmission risk has been reviewed: Yes Transition of care needs: no transition of care needs at this time

## 2023-08-28 NOTE — Progress Notes (Signed)
 PROGRESS NOTE    Stephanie Moreno  ZOX:[id number] DOB: [birth year] DOA: 08/27/2023 PCP: Benita Stabile, MD   Brief Narrative:    Stephanie Moreno is a 78 y.o. female with medical history significant of hypertension, type 2 diabetes mellitus, CKD 3B, GERD who presents to the emergency department from home via EMS due to falling at home..  Patient complained of several days of onset of not feeling well, she complained of cough, generalized weakness, decreased appetite.  She has been admitted with COVID-19 pneumonia as well as electrolyte abnormalities of hypokalemia and hypomagnesemia.  PT/OT evaluation pending.  Assessment & Plan:   Principal Problem:   COVID-19 virus infection Active Problems:   Hypokalemia   Recurrent falls   Elevated CK   Obesity, Class I, BMI 30-34.9  Assessment and Plan:   COVID-19 virus pneumonia SARS coronavirus 2 was positive Chest x-ray was suggestive of pneumonia Procalcitonin will be checked to ensure no superimposed bacterial pneumonia Start Paxlovid Continue albuterol q.6h Continue Mucinex, Robitussin  Continue Tylenol p.r.n. for fever Continue incentive spirometry Patient was not requiring oxygen at this time and was in no acute distress Continue symptomatic treatment at this time   Hypokalemia/hypomagnesemia Replete IV and p.o. and monitor in a.m.   Recurrent falls Continue fall precaution Continue PT/OT eval and treat   Elevated CK Total CK 896>916 Continue IV hydration and continue to monitor CK levels   Obesity class I (BMI 30.29) Diet and lifestyle modification   DVT prophylaxis: Lovenox Code Status: Full Family Communication: None at bedside Disposition Plan:  Status is: Inpatient Remains inpatient appropriate because: Need for IV fluids   Consultants:  None  Procedures:  None  Antimicrobials:  Anti-infectives (From admission, onward)    Start     Dose/Rate Route Frequency Ordered Stop   08/28/23 1000   nirmatrelvir/ritonavir (renal dosing) (PAXLOVID) 2 tablet        2 tablet Oral 2 times daily 08/28/23 0721 09/02/23 0959      Subjective: Patient seen and evaluated today with no new acute complaints or concerns. No acute concerns or events noted overnight.  Objective: Vitals:   08/28/23 0800 08/28/23 0900 08/28/23 0916 08/28/23 1107  BP: 137/77 (!) 134/91  (!) 151/80  Pulse: 93 (!) 102  (!) 103  Resp: (!) 24 (!) 26  20  Temp:   98 F (36.7 C)   TempSrc:   Oral   SpO2: 96% 95%  93%    Intake/Output Summary (Last 24 hours) at 08/28/2023 1125 Last data filed at 08/28/2023 1107 Gross per 24 hour  Intake 246.58 ml  Output --  Net 246.58 ml   There were no vitals filed for this visit.  Examination:  General exam: Appears calm and comfortable  Respiratory system: Clear to auscultation. Respiratory effort normal. Cardiovascular system: S1 & S2 heard, RRR.  Gastrointestinal system: Abdomen is soft Central nervous system: Alert and awake Extremities: No edema Skin: No significant lesions noted Psychiatry: Flat affect.    Data Reviewed: I have personally reviewed following labs and imaging studies  CBC: Recent Labs  Lab 08/27/23 2148 08/28/23 0520  WBC 8.1 7.8  HGB 12.2 11.6*  HCT 37.1 35.9*  MCV 84.1 84.5  PLT 253 243   Basic Metabolic Panel: Recent Labs  Lab 08/27/23 2148 08/28/23 0520  NA 139 138  K 3.0* 2.7*  CL 106 105  CO2 20* 22  GLUCOSE 111* 107*  BUN 16 14  CREATININE 1.51* 1.29*  CALCIUM 9.3 8.9  MG  --  1.5*  PHOS  --  3.2   GFR: Estimated Creatinine Clearance: 36 mL/min (A) (by C-G formula based on SCr of 1.29 mg/dL (H)). Liver Function Tests: Recent Labs  Lab 08/28/23 0520  AST 33  ALT 20  ALKPHOS 51  BILITOT 0.7  PROT 7.5  ALBUMIN 3.6   No results for input(s): "LIPASE", "AMYLASE" in the last 168 hours. No results for input(s): "AMMONIA" in the last 168 hours. Coagulation Profile: No results for input(s): "INR", "PROTIME" in the  last 168 hours. Cardiac Enzymes: Recent Labs  Lab 08/27/23 2148 08/28/23 0747  CKTOTAL 896* 916*   BNP (last 3 results) No results for input(s): "PROBNP" in the last 8760 hours. HbA1C: No results for input(s): "HGBA1C" in the last 72 hours. CBG: No results for input(s): "GLUCAP" in the last 168 hours. Lipid Profile: No results for input(s): "CHOL", "HDL", "LDLCALC", "TRIG", "CHOLHDL", "LDLDIRECT" in the last 72 hours. Thyroid Function Tests: No results for input(s): "TSH", "T4TOTAL", "FREET4", "T3FREE", "THYROIDAB" in the last 72 hours. Anemia Panel: No results for input(s): "VITAMINB12", "FOLATE", "FERRITIN", "TIBC", "IRON", "RETICCTPCT" in the last 72 hours. Sepsis Labs: Recent Labs  Lab 08/28/23 0520  PROCALCITON 0.11    Recent Results (from the past 240 hours)  Resp panel by RT-PCR (RSV, Flu A&B, Covid) Anterior Nasal Swab     Status: Abnormal   Collection Time: 08/27/23  9:27 PM   Specimen: Anterior Nasal Swab  Result Value Ref Range Status   SARS Coronavirus 2 by RT PCR POSITIVE (A) NEGATIVE Final    Comment: (NOTE) SARS-CoV-2 target nucleic acids are DETECTED.  The SARS-CoV-2 RNA is generally detectable in upper respiratory specimens during the acute phase of infection. Positive results are indicative of the presence of the identified virus, but do not rule out bacterial infection or co-infection with other pathogens not detected by the test. Clinical correlation with patient history and other diagnostic information is necessary to determine patient infection status. The expected result is Negative.  Fact Sheet for Patients: BloggerCourse.com  Fact Sheet for Healthcare Providers: SeriousBroker.it  This test is not yet approved or cleared by the [place] FDA and  has been authorized for detection and/or diagnosis of SARS-CoV-2 by FDA under an Emergency Use Authorization (EUA).  This EUA will remain in  effect (meaning this test can be used) for the duration of  the COVID-19 declaration under Section 564(b)(1) of the A ct, 21 U.S.C. section 360bbb-3(b)(1), unless the authorization is terminated or revoked sooner.     Influenza A by PCR NEGATIVE NEGATIVE Final   Influenza B by PCR NEGATIVE NEGATIVE Final    Comment: (NOTE) The Xpert Xpress SARS-CoV-2/FLU/RSV plus assay is intended as an aid in the diagnosis of influenza from Nasopharyngeal swab specimens and should not be used as a sole basis for treatment. Nasal washings and aspirates are unacceptable for Xpert Xpress SARS-CoV-2/FLU/RSV testing.  Fact Sheet for Patients: BloggerCourse.com  Fact Sheet for Healthcare Providers: SeriousBroker.it  This test is not yet approved or cleared by the [place] FDA and has been authorized for detection and/or diagnosis of SARS-CoV-2 by FDA under an Emergency Use Authorization (EUA). This EUA will remain in effect (meaning this test can be used) for the duration of the COVID-19 declaration under Section 564(b)(1) of the Act, 21 U.S.C. section 360bbb-3(b)(1), unless the authorization is terminated or revoked.     Resp Syncytial Virus by PCR NEGATIVE NEGATIVE Final    Comment: (NOTE) Fact Sheet for  Patients: BloggerCourse.com  Fact Sheet for Healthcare Providers: SeriousBroker.it  This test is not yet approved or cleared by the [place] FDA and has been authorized for detection and/or diagnosis of SARS-CoV-2 by FDA under an Emergency Use Authorization (EUA). This EUA will remain in effect (meaning this test can be used) for the duration of the COVID-19 declaration under Section 564(b)(1) of the Act, 21 U.S.C. section 360bbb-3(b)(1), unless the authorization is terminated or revoked.  Performed at [facility], [address] [place], [place] [address]          Radiology  Studies: [facility] Chest [facility] 1 View Result Date: 08/28/2023 CLINICAL DATA:  COVID positive, cough EXAM: PORTABLE CHEST 1 VIEW COMPARISON:  None Available. FINDINGS: Mild right lower lobe opacity, suspicious for pneumonia. Left lung is clear. No pleural effusion or pneumothorax. The heart is normal in size. IMPRESSION: Mild right lower lobe opacity, suspicious for pneumonia. Electronically Signed   By: Charline Bills M.D.   On: 08/28/2023 00:45   CT Head Wo Contrast Result Date: 08/28/2023 CLINICAL DATA:  Multiple falls, weakness, headache EXAM: CT HEAD WITHOUT CONTRAST TECHNIQUE: Contiguous axial images were obtained from the base of the skull through the vertex without intravenous contrast. RADIATION DOSE REDUCTION: This exam was performed according to the departmental dose-optimization program which includes automated exposure control, adjustment of the mA and/or kV according to patient size and/or use of iterative reconstruction technique. COMPARISON:  03/16/2022 FINDINGS: Brain: No evidence of acute infarction, hemorrhage, hydrocephalus, extra-axial collection or mass lesion/mass effect. Subcortical white matter and periventricular small vessel ischemic changes. Vascular: Intracranial atherosclerosis. Skull: Normal. Negative for fracture or focal lesion. Sinuses/Orbits: The visualized paranasal sinuses are essentially clear. The mastoid air cells are unopacified. Other: None. IMPRESSION: No acute intracranial abnormality. Small vessel ischemic changes. Electronically Signed   By: Charline Bills M.D.   On: 08/28/2023 00:38        Scheduled Meds:  albuterol  2.5 mg Nebulization Q6H   dextromethorphan-guaiFENesin  1 tablet Oral BID   enoxaparin (LOVENOX) injection  40 mg Subcutaneous Q24H   nirmatrelvir/ritonavir (renal dosing)  2 tablet Oral BID   potassium chloride  40 mEq Oral Once   Continuous Infusions:  lactated ringers 80 mL/hr at 08/28/23 0537   potassium chloride 10 mEq (08/28/23 1108)      LOS: 1 day    Time spent: 55 minutes    Macon Lesesne Hoover Brunette, DO [facility]  If 7PM-7AM, please contact night-coverage [email or web address] 08/28/2023, 11:25 AM

## 2023-08-28 NOTE — Plan of Care (Signed)
  Problem: Education: Goal: Knowledge of risk factors and measures for prevention of condition will improve Outcome: Progressing   Problem: Health Behavior/Discharge Planning: Goal: Ability to manage health-related needs will improve Outcome: Progressing   Problem: Clinical Measurements: Goal: Ability to maintain clinical measurements within normal limits will improve Outcome: Progressing

## 2023-08-29 DIAGNOSIS — U071 COVID-19: Secondary | ICD-10-CM | POA: Diagnosis not present

## 2023-08-29 LAB — CBC
HCT: 36.5 % (ref 36.0–46.0)
Hemoglobin: 11.7 g/dL — ABNORMAL LOW (ref 12.0–15.0)
MCH: 27.2 pg (ref 26.0–34.0)
MCHC: 32.1 g/dL (ref 30.0–36.0)
MCV: 84.9 fL (ref 80.0–100.0)
Platelets: 244 10*3/uL (ref 150–400)
RBC: 4.3 MIL/uL (ref 3.87–5.11)
RDW: 13.8 % (ref 11.5–15.5)
WBC: 8.6 10*3/uL (ref 4.0–10.5)
nRBC: 0 % (ref 0.0–0.2)

## 2023-08-29 LAB — MAGNESIUM: Magnesium: 1.9 mg/dL (ref 1.7–2.4)

## 2023-08-29 LAB — BASIC METABOLIC PANEL
Anion gap: 10 (ref 5–15)
BUN: 13 mg/dL (ref 8–23)
CO2: 21 mmol/L — ABNORMAL LOW (ref 22–32)
Calcium: 9.1 mg/dL (ref 8.9–10.3)
Chloride: 106 mmol/L (ref 98–111)
Creatinine, Ser: 1.3 mg/dL — ABNORMAL HIGH (ref 0.44–1.00)
GFR, Estimated: 42 mL/min — ABNORMAL LOW (ref >60)
Glucose, Bld: 98 mg/dL (ref 70–99)
Potassium: 4.3 mmol/L (ref 3.5–5.1)
Sodium: 137 mmol/L (ref 135–145)

## 2023-08-29 LAB — CK: Total CK: 916 U/L — ABNORMAL HIGH (ref 38–234)

## 2023-08-29 LAB — GLUCOSE, CAPILLARY
Glucose-Capillary: 98 mg/dL (ref 70–99)
Glucose-Capillary: 136 mg/dL — ABNORMAL HIGH (ref 70–99)
Glucose-Capillary: 112 mg/dL — ABNORMAL HIGH (ref 70–99)
Glucose-Capillary: 127 mg/dL — ABNORMAL HIGH (ref 70–99)

## 2023-08-29 MED ORDER — LACTATED RINGERS IV SOLN: INTRAVENOUS | Status: AC

## 2023-08-29 MED ORDER — ALBUTEROL SULFATE (2.5 MG/3ML) 0.083% IN NEBU
2.5000 mg | INHALATION_SOLUTION | Freq: Two times a day (BID) | RESPIRATORY_TRACT | Status: DC
  Administered 2023-08-29 – 2023-08-30 (×2): 2.5 mg via RESPIRATORY_TRACT
  Filled 2023-08-29 (×2): qty 3

## 2023-08-29 NOTE — TOC Initial Note (Signed)
 Transition of Care [facility]) - Initial/Assessment Note    Patient Details  Name: Stephanie Moreno MRN: [id number] Date of Birth: 17-Oct-1945  Transition of Care [facility]) CM/SW Contact:    Villa Herb, LCSWA Phone Number: 08/29/2023, 1:23 PM  Clinical Narrative:                 CSW updated that PT is recommending HH PT for pt at D/C. CSW unable to reach pt by phone. Due to pt having COVID CSW spoke with pts daughter. Pts daughter states that prior to fall pt was going to OP PT with [facility]. Pts daughter states at this time they prefer [facility] PT be set up and do not have an agency preference. CSW spoke to [place] with [facility] who accepts [facility] PT referral. CSW requested [facility] PT orders. TOC to follow.   Expected Discharge Plan: Home w Home Health Services Barriers to Discharge: Continued Medical Work up   Patient Goals and CMS Choice Patient states their goals for this hospitalization and ongoing recovery are:: return home CMS Medicare.gov Compare Post Acute Care list provided to:: Patient Choice offered to / list presented to : Patient      Expected Discharge Plan and Services In-house Referral: Clinical Social Work Discharge Planning Services: CM Consult Post Acute Care Choice: Home Health Living arrangements for the past 2 months: Single Family Home                           HH Arranged: PT HH Agency: [facility] Date HH Agency Contacted: 08/29/23   Representative spoke with at [facility] Agency: Adele Dan  Prior Living Arrangements/Services Living arrangements for the past 2 months: Single Family Home Lives with:: Self Patient language and need for interpreter reviewed:: Yes Do you feel safe going back to the place where you live?: Yes      Need for Family Participation in Patient Care: Yes (Comment) Care giver support system in place?: Yes (comment) Current home services: DME Criminal Activity/Legal Involvement Pertinent to Current Situation/Hospitalization:  No - Comment as needed  Activities of Daily Living   ADL Screening (condition at time of admission) Independently performs ADLs?: No Does the patient have a NEW difficulty with bathing/dressing/toileting/self-feeding that is expected to last >3 days?: No Does the patient have a NEW difficulty with getting in/out of bed, walking, or climbing stairs that is expected to last >3 days?: No Does the patient have a NEW difficulty with communication that is expected to last >3 days?: No Is the patient deaf or have difficulty hearing?: No Does the patient have difficulty seeing, even when wearing glasses/contacts?: No Does the patient have difficulty concentrating, remembering, or making decisions?: No  Permission Sought/Granted                  Emotional Assessment Appearance:: Appears stated age Attitude/Demeanor/Rapport: Engaged Affect (typically observed): Accepting Orientation: : Oriented to Self, Oriented to Place, Oriented to  Time, Oriented to Situation Alcohol / Substance Use: Not Applicable Psych Involvement: No (comment)  Admission diagnosis:  Generalized weakness [R53.1] Fall, initial encounter [W19.XXXA] COVID-19 virus infection [U07.1] COVID-19 [U07.1] Patient Active Problem List   Diagnosis Date Noted   Hypokalemia 08/28/2023   Recurrent falls 08/28/2023   Elevated CK 08/28/2023   Obesity, Class I, BMI 30-34.9 08/28/2023   COVID-19 virus infection 08/27/2023   Pain of both hip joints 03/26/2023   Proteinuria 03/25/2023   Pain in both lower  legs 12/06/2022   Unsteady gait when walking 12/06/2022   Pain of left hip joint 09/20/2022   Major depressive disorder 09/17/2022   Arthritis 08/21/2022   Cramps of lower extremity 06/20/2022   Lumbago with sciatica 06/20/2022   Nasal congestion 06/20/2022   Dyspnea 04/28/2022   History of UTI 04/12/2022   Abnormal liver function tests 10/06/2021   Insomnia 10/06/2021   Localized edema 10/06/2021   Low back pain  10/06/2021   Minimal cognitive impairment 10/06/2021   Thyroid nodule 10/06/2021   Malignant neoplasm of female breast (HCC) 10/06/2021   Chronic kidney disease, stage 3a (HCC) 10/05/2021   Hyperglycemia due to type 2 diabetes mellitus (HCC) 10/05/2021   Urge incontinence 07/26/2021   Dehydration    Chronic pain syndrome 03/22/2021   Spondylolisthesis, lumbar region    Spinal stenosis, lumbar region, with neurogenic claudication    Dysphagia 03/18/2021   Speech disturbance 03/18/2021   Fusion of spine of lumbar region 03/15/2021   Cerebrovascular disease 12/20/2020   GERD (gastroesophageal reflux disease) 11/18/2020   Type 2 diabetes mellitus with stage 3b chronic kidney disease, without long-term current use of insulin (HCC) 07/12/2020   Mixed hyperlipidemia 07/12/2020   Essential hypertension, benign 07/12/2020   Iron deficiency anemia    Loss of weight 04/09/2020   Dyspepsia 01/28/2020   Constipation 01/28/2020   Lower abdominal pain 01/28/2020   Normocytic anemia 01/28/2020   PCP:  Benita Stabile, MD Pharmacy:   [facility] [phone] - [place], [place] - [address] AT [facility] [address] [place] [address] Phone: [phone] Fax: [phone]  [facility] Delivery - [place], [place] - [address] [place]  [address] Phone: [phone] Fax: [phone]  [facility] Mail Delivery - [place], [place] - [address] [place] [address] Phone: [phone] Fax: [phone]  [facility] - [address] [place] - [address] [place] [address] Phone: [phone] Fax: [phone]  [facility] - [place], [place] - [address] [place] [address] Phone: [phone] Fax: [phone]     Social Drivers of Health (SDOH) Social History: SDOH Screenings   Food Insecurity: No Food  Insecurity (08/28/2023)  Housing: Low Risk  (08/28/2023)  Transportation Needs: Unmet Transportation Needs (08/28/2023)  Utilities: Not At Risk (08/28/2023)  Depression (PHQ2-9): Medium Risk (12/15/2020)  Social Connections: Moderately Integrated (08/28/2023)  Tobacco Use: Low Risk  (08/28/2023)   SDOH Interventions:     Readmission Risk Interventions    08/29/2023    1:22 PM  Readmission Risk Prevention Plan  Transportation Screening Complete  Home Care Screening Complete  Medication Review (RN CM) Complete

## 2023-08-29 NOTE — Progress Notes (Signed)
 PROGRESS NOTE    Stephanie Moreno  HYQ:[id number] DOB: December 12, 1945 DOA: 08/27/2023 PCP: Benita Stabile, MD   Brief Narrative:    Stephanie Moreno is a 78 y.o. female with medical history significant of hypertension, type 2 diabetes mellitus, CKD 3B, GERD who presents to the emergency department from home via EMS due to falling at home..  Patient complained of several days of onset of not feeling well, she complained of cough, generalized weakness, decreased appetite.  She has been admitted with COVID-19 pneumonia as well as electrolyte abnormalities of hypokalemia and hypomagnesemia.  PT/OT evaluation pending.  Her CK levels continue to remain elevated despite IV fluid, continue further IV fluid and monitor CK levels in a.m. and if improved,  anticipate discharge.  Assessment & Plan:   Principal Problem:   COVID-19 virus infection Active Problems:   Hypokalemia   Recurrent falls   Elevated CK   Obesity, Class I, BMI 30-34.9  Assessment and Plan:   COVID-19 virus pneumonia SARS coronavirus 2 was positive Chest x-ray was suggestive of pneumonia Procalcitonin will be checked to ensure no superimposed bacterial pneumonia Start Paxlovid Continue albuterol q.6h Continue Mucinex, Robitussin  Continue Tylenol p.r.n. for fever Continue incentive spirometry Patient was not requiring oxygen at this time and was in no acute distress Continue symptomatic treatment at this time   Recurrent falls Continue fall precaution Continue PT/OT eval and treat   Elevated CK Total CK [phone] Still remaining persistently elevated Continue IV hydration and continue to monitor CK levels   Obesity class I (BMI 30.29) Diet and lifestyle modification   DVT prophylaxis: Lovenox Code Status: Full Family Communication: None at bedside Disposition Plan:  Status is: Inpatient Remains inpatient appropriate because: Need for IV fluids   Consultants:  None  Procedures:  None  Antimicrobials:   Anti-infectives (From admission, onward)    Start     Dose/Rate Route Frequency Ordered Stop   08/28/23 1000  nirmatrelvir/ritonavir (renal dosing) (PAXLOVID) 2 tablet        2 tablet Oral 2 times daily 08/28/23 0721 09/02/23 0959      Subjective: Patient seen and evaluated today with no new acute complaints or concerns. No acute concerns or events noted overnight.  Objective: Vitals:   08/28/23 1950 08/28/23 2051 08/29/23 0338 08/29/23 0749  BP: (!) 156/70  (!) 145/74   Pulse: (!) 106  83   Resp: 18     Temp: 100.3 F (37.9 C)  98.6 F (37 C)   TempSrc: Oral  Oral   SpO2: 97% 97% 100% 97%  Weight:      Height:        Intake/Output Summary (Last 24 hours) at 08/29/2023 0829 Last data filed at 08/28/2023 1500 Gross per 24 hour  Intake 1511.91 ml  Output --  Net 1511.91 ml   Filed Weights   08/28/23 1554  Weight: 76.8 kg    Examination:  General exam: Appears calm and comfortable  Respiratory system: Clear to auscultation. Respiratory effort normal. Cardiovascular system: S1 & S2 heard, RRR.  Gastrointestinal system: Abdomen is soft Central nervous system: Alert and awake Extremities: No edema Skin: No significant lesions noted Psychiatry: Flat affect.    Data Reviewed: I have personally reviewed following labs and imaging studies  CBC: Recent Labs  Lab 08/27/23 2148 08/28/23 0520 08/29/23 0321  WBC 8.1 7.8 8.6  HGB 12.2 11.6* 11.7*  HCT 37.1 35.9* 36.5  MCV 84.1 84.5 84.9  PLT 253 243 244  Basic Metabolic Panel: Recent Labs  Lab 08/27/23 2148 08/28/23 0520 08/29/23 0321  NA 139 138 137  K 3.0* 2.7* 4.3  CL 106 105 106  CO2 20* 22 21*  GLUCOSE 111* 107* 98  BUN 16 14 13   CREATININE 1.51* 1.29* 1.30*  CALCIUM 9.3 8.9 9.1  MG  --  1.5* 1.9  PHOS  --  3.2  --    GFR: Estimated Creatinine Clearance: 35.6 mL/min (A) (by C-G formula based on SCr of 1.3 mg/dL (H)). Liver Function Tests: Recent Labs  Lab 08/28/23 0520  AST 33  ALT 20   ALKPHOS 51  BILITOT 0.7  PROT 7.5  ALBUMIN 3.6   No results for input(s): "LIPASE", "AMYLASE" in the last 168 hours. No results for input(s): "AMMONIA" in the last 168 hours. Coagulation Profile: No results for input(s): "INR", "PROTIME" in the last 168 hours. Cardiac Enzymes: Recent Labs  Lab 08/27/23 2148 08/28/23 0747 08/29/23 0321  CKTOTAL 896* 916* 916*   BNP (last 3 results) No results for input(s): "PROBNP" in the last 8760 hours. HbA1C: No results for input(s): "HGBA1C" in the last 72 hours. CBG: Recent Labs  Lab 08/28/23 1623 08/28/23 2209 08/29/23 0715  GLUCAP 115* 115* 98   Lipid Profile: No results for input(s): "CHOL", "HDL", "LDLCALC", "TRIG", "CHOLHDL", "LDLDIRECT" in the last 72 hours. Thyroid Function Tests: No results for input(s): "TSH", "T4TOTAL", "FREET4", "T3FREE", "THYROIDAB" in the last 72 hours. Anemia Panel: No results for input(s): "VITAMINB12", "FOLATE", "FERRITIN", "TIBC", "IRON", "RETICCTPCT" in the last 72 hours. Sepsis Labs: Recent Labs  Lab 08/28/23 0520  PROCALCITON 0.11    Recent Results (from the past 240 hours)  Resp panel by RT-PCR (RSV, Flu A&B, Covid) Anterior Nasal Swab     Status: Abnormal   Collection Time: 08/27/23  9:27 PM   Specimen: Anterior Nasal Swab  Result Value Ref Range Status   SARS Coronavirus 2 by RT PCR POSITIVE (A) NEGATIVE Final    Comment: (NOTE) SARS-CoV-2 target nucleic acids are DETECTED.  The SARS-CoV-2 RNA is generally detectable in upper respiratory specimens during the acute phase of infection. Positive results are indicative of the presence of the identified virus, but do not rule out bacterial infection or co-infection with other pathogens not detected by the test. Clinical correlation with patient history and other diagnostic information is necessary to determine patient infection status. The expected result is Negative.  Fact Sheet for  Patients: BloggerCourse.com  Fact Sheet for Healthcare Providers: SeriousBroker.it  This test is not yet approved or cleared by the [place] FDA and  has been authorized for detection and/or diagnosis of SARS-CoV-2 by FDA under an Emergency Use Authorization (EUA).  This EUA will remain in effect (meaning this test can be used) for the duration of  the COVID-19 declaration under Section 564(b)(1) of the A ct, 21 U.S.C. section 360bbb-3(b)(1), unless the authorization is terminated or revoked sooner.     Influenza A by PCR NEGATIVE NEGATIVE Final   Influenza B by PCR NEGATIVE NEGATIVE Final    Comment: (NOTE) The Xpert Xpress SARS-CoV-2/FLU/RSV plus assay is intended as an aid in the diagnosis of influenza from Nasopharyngeal swab specimens and should not be used as a sole basis for treatment. Nasal washings and aspirates are unacceptable for Xpert Xpress SARS-CoV-2/FLU/RSV testing.  Fact Sheet for Patients: BloggerCourse.com  Fact Sheet for Healthcare Providers: SeriousBroker.it  This test is not yet approved or cleared by the [place] FDA and has been authorized for detection  and/or diagnosis of SARS-CoV-2 by FDA under an Emergency Use Authorization (EUA). This EUA will remain in effect (meaning this test can be used) for the duration of the COVID-19 declaration under Section 564(b)(1) of the Act, 21 U.S.C. section 360bbb-3(b)(1), unless the authorization is terminated or revoked.     Resp Syncytial Virus by PCR NEGATIVE NEGATIVE Final    Comment: (NOTE) Fact Sheet for Patients: BloggerCourse.com  Fact Sheet for Healthcare Providers: SeriousBroker.it  This test is not yet approved or cleared by the [place] FDA and has been authorized for detection and/or diagnosis of SARS-CoV-2 by FDA under an Emergency Use  Authorization (EUA). This EUA will remain in effect (meaning this test can be used) for the duration of the COVID-19 declaration under Section 564(b)(1) of the Act, 21 U.S.C. section 360bbb-3(b)(1), unless the authorization is terminated or revoked.  Performed at [facility], [address] [place], [place] [address]          Radiology Studies: [facility] Chest [facility] 1 View Result Date: 08/28/2023 CLINICAL DATA:  COVID positive, cough EXAM: PORTABLE CHEST 1 VIEW COMPARISON:  None Available. FINDINGS: Mild right lower lobe opacity, suspicious for pneumonia. Left lung is clear. No pleural effusion or pneumothorax. The heart is normal in size. IMPRESSION: Mild right lower lobe opacity, suspicious for pneumonia. Electronically Signed   By: Charline Bills M.D.   On: 08/28/2023 00:45   CT Head Wo Contrast Result Date: 08/28/2023 CLINICAL DATA:  Multiple falls, weakness, headache EXAM: CT HEAD WITHOUT CONTRAST TECHNIQUE: Contiguous axial images were obtained from the base of the skull through the vertex without intravenous contrast. RADIATION DOSE REDUCTION: This exam was performed according to the departmental dose-optimization program which includes automated exposure control, adjustment of the mA and/or kV according to patient size and/or use of iterative reconstruction technique. COMPARISON:  03/16/2022 FINDINGS: Brain: No evidence of acute infarction, hemorrhage, hydrocephalus, extra-axial collection or mass lesion/mass effect. Subcortical white matter and periventricular small vessel ischemic changes. Vascular: Intracranial atherosclerosis. Skull: Normal. Negative for fracture or focal lesion. Sinuses/Orbits: The visualized paranasal sinuses are essentially clear. The mastoid air cells are unopacified. Other: None. IMPRESSION: No acute intracranial abnormality. Small vessel ischemic changes. Electronically Signed   By: Charline Bills M.D.   On: 08/28/2023 00:38        Scheduled Meds:   albuterol  2.5 mg Nebulization TID   dextromethorphan-guaiFENesin  1 tablet Oral BID   enoxaparin (LOVENOX) injection  40 mg Subcutaneous Q24H   insulin aspart  0-5 Units Subcutaneous QHS   insulin aspart  0-9 Units Subcutaneous TID WC   insulin glargine-yfgn  5 Units Subcutaneous QHS   nirmatrelvir/ritonavir (renal dosing)  2 tablet Oral BID   Continuous Infusions:  lactated ringers 100 mL/hr at 08/29/23 0727     LOS: 2 days    Time spent: 55 minutes    Stephanie Segall Hoover Brunette, DO [facility]  If 7PM-7AM, please contact night-coverage [email or web address] 08/29/2023, 8:29 AM

## 2023-08-29 NOTE — Evaluation (Signed)
 Physical Therapy Evaluation Patient Details Name: Stephanie Moreno MRN: [id number] DOB: [birth year] Today's Date: 08/29/2023  History of Present Illness  Stephanie Moreno is a 78 y.o. female with medical history significant of hypertension, type 2 diabetes mellitus, CKD 3B, GERD who presents to the emergency department from home via EMS due to falling at home..  Patient complained of several days of onset of not feeling well, she complained of cough, generalized weakness, decreased appetite.  She complained of sustaining a fall at home this morning around 10:30 AM without hurting self due to her legs giving out, she called her niece who activated EMS, but patient refused to go to the hospital at that time.  She later on sustained another fall around 5:30 PM and was on the floor for about 2 hours, she denies loss of consciousness or hitting her head.  Daughter then insisted that she needs to go to the ED for further evaluation and management.  EMS was activated and she was taken to the ED.   Clinical Impression  Patient demonstrates slow labored movement for sitting up at bedside, once seated complained of mild dizziness/lightheadedness that resolved after 3-4 minutes, then able to ambulate to bathroom using RW to have a bowel movement with fair/good return for transferring to/from commode, tolerated standing in front of sink to wash hands and limited for walking in room due to c/o fatigue.  Patient tolerated sitting up in chair after therapy - nursing staff aware.  Patient will benefit from continued skilled physical therapy in hospital and recommended venue below to increase strength, balance, endurance for safe ADLs and gait.           If plan is discharge home, recommend the following: A little help with walking and/or transfers;A little help with bathing/dressing/bathroom;Help with stairs or ramp for entrance;Assistance with cooking/housework   Can travel by private vehicle        Equipment  Recommendations None recommended by PT  Recommendations for Other Services       Functional Status Assessment Patient has had a recent decline in their functional status and demonstrates the ability to make significant improvements in function in a reasonable and predictable amount of time.     Precautions / Restrictions Precautions Precautions: Fall Restrictions Weight Bearing Restrictions Per Provider Order: No      Mobility  Bed Mobility Overal bed mobility: Needs Assistance Bed Mobility: Supine to Sit     Supine to sit: Supervision     General bed mobility comments: increased time, labored movement    Transfers Overall transfer level: Needs assistance Equipment used: Rolling walker (2 wheels) Transfers: Sit to/from Stand, Bed to chair/wheelchair/BSC Sit to Stand: Contact guard assist   Step pivot transfers: Contact guard assist       General transfer comment: slow labored movment using RW, fair/good return for transferring to/from commode in bathroom using grab bars    Ambulation/Gait Ambulation/Gait assistance: Contact guard assist Gait Distance (Feet): 15 Feet Assistive device: Rolling walker (2 wheels) Gait Pattern/deviations: Decreased step length - right, Decreased step length - left, Decreased stride length Gait velocity: decreased     General Gait Details: able to ambulate with slow labored  movement using RW without loss of balance, limited mostly due to fatigue, on room air with SpO2 at 98%  Stairs            Wheelchair Mobility     Tilt Bed    Modified Rankin (Stroke Patients Only)  Balance Overall balance assessment: Needs assistance Sitting-balance support: Feet supported, No upper extremity supported Sitting balance-Leahy Scale: Fair Sitting balance - Comments: fair/good seated at EOB   Standing balance support: Reliant on assistive device for balance, During functional activity, Bilateral upper extremity  supported Standing balance-Leahy Scale: Fair Standing balance comment: using RW                             Pertinent Vitals/Pain Pain Assessment Pain Assessment: No/denies pain    Home Living Family/patient expects to be discharged to:: Private residence Living Arrangements: Alone Available Help at Discharge: Family;Available 24 hours/day Type of Home: Apartment Home Access: Stairs to enter       Home Layout: One level Home Equipment: Agricultural consultant (2 wheels);Grab bars - tub/shower;BSC/3in1      Prior Function Prior Level of Function : Needs assist       Physical Assist : Mobility (physical);ADLs (physical) Mobility (physical): Bed mobility;Transfers;Gait;Stairs   Mobility Comments: Household and short distanced community ambulation using RW, does not drive ADLs Comments: Indepedent for household ADLs, assisted by family for community     Extremity/Trunk Assessment   Upper Extremity Assessment Upper Extremity Assessment: Defer to OT evaluation    Lower Extremity Assessment Lower Extremity Assessment: Generalized weakness    Cervical / Trunk Assessment Cervical / Trunk Assessment: Normal  Communication   Communication Communication: No apparent difficulties    Cognition Arousal: Alert Behavior During Therapy: WFL for tasks assessed/performed   PT - Cognitive impairments: No apparent impairments                         Following commands: Intact       Cueing Cueing Techniques: Verbal cues     General Comments      Exercises     Assessment/Plan    PT Assessment Patient needs continued PT services  PT Problem List Decreased strength;Decreased activity tolerance;Decreased balance;Decreased mobility       PT Treatment Interventions DME instruction;Gait training;Stair training;Functional mobility training;Therapeutic activities;Therapeutic exercise;Balance training;Patient/family education    PT Goals (Current goals can be  found in the Care Plan section)  Acute Rehab PT Goals Patient Stated Goal: return home with family to assist PT Goal Formulation: With patient Time For Goal Achievement: 09/02/23 Potential to Achieve Goals: Good    Frequency Min 3X/week     Co-evaluation               AM-PAC PT "6 Clicks" Mobility  Outcome Measure Help needed turning from your back to your side while in a flat bed without using bedrails?: None Help needed moving from lying on your back to sitting on the side of a flat bed without using bedrails?: A Little Help needed moving to and from a bed to a chair (including a wheelchair)?: A Little Help needed standing up from a chair using your arms (e.g., wheelchair or bedside chair)?: A Little Help needed to walk in hospital room?: A Little Help needed climbing 3-5 steps with a railing? : A Lot 6 Click Score: 18    End of Session   Activity Tolerance: Patient tolerated treatment well;Patient limited by fatigue Patient left: in chair;with call bell/phone within reach Nurse Communication: Mobility status PT Visit Diagnosis: Unsteadiness on feet (R26.81);Other abnormalities of gait and mobility (R26.89);Muscle weakness (generalized) (M62.81)    Time: 7829-5621 PT Time Calculation (min) (ACUTE ONLY): 33 min   Charges:  PT Evaluation $PT Eval Moderate Complexity: 1 Mod PT Treatments $Therapeutic Activity: 23-37 mins PT General Charges $$ ACUTE PT VISIT: 1 Visit         2:39 PM, 08/29/23 Ocie Bob, MPT Physical Therapist with [facility] [phone] office [phone] mobile phone

## 2023-08-29 NOTE — Plan of Care (Signed)
  Problem: Acute Rehab PT Goals(only PT should resolve) Goal: Pt Will Go Supine/Side To Sit Flowsheets (Taken 08/29/2023 1444) Pt will go Supine/Side to Sit: with modified independence Goal: Patient Will Transfer Sit To/From Stand Flowsheets (Taken 08/29/2023 1444) Patient will transfer sit to/from stand: with modified independence Goal: Pt Will Transfer Bed To Chair/Chair To Bed Flowsheets (Taken 08/29/2023 1444) Pt will Transfer Bed to Chair/Chair to Bed: with modified independence Goal: Pt Will Ambulate Flowsheets (Taken 08/29/2023 1444) Pt will Ambulate:  50 feet  with supervision  with modified independence  with rolling walker   2:46 PM, 08/29/23 Ocie Bob, MPT Physical Therapist with [facility] [phone] office [phone] mobile phone

## 2023-08-29 NOTE — Plan of Care (Signed)
  Problem: Coping: Goal: Psychosocial and spiritual needs will be supported Outcome: Progressing   Problem: Clinical Measurements: Goal: Ability to maintain clinical measurements within normal limits will improve Outcome: Progressing Goal: Will remain free from infection Outcome: Progressing Goal: Diagnostic test results will improve Outcome: Progressing   Problem: Pain Managment: Goal: General experience of comfort will improve and/or be controlled Outcome: Progressing   Problem: Safety: Goal: Ability to remain free from injury will improve Outcome: Progressing

## 2023-08-30 DIAGNOSIS — U071 COVID-19: Secondary | ICD-10-CM | POA: Diagnosis not present

## 2023-08-30 LAB — BASIC METABOLIC PANEL
Anion gap: 9 (ref 5–15)
BUN: 15 mg/dL (ref 8–23)
CO2: 20 mmol/L — ABNORMAL LOW (ref 22–32)
Calcium: 8.6 mg/dL — ABNORMAL LOW (ref 8.9–10.3)
Chloride: 106 mmol/L (ref 98–111)
Creatinine, Ser: 1.23 mg/dL — ABNORMAL HIGH (ref 0.44–1.00)
GFR, Estimated: 45 mL/min — ABNORMAL LOW (ref >60)
Glucose, Bld: 90 mg/dL (ref 70–99)
Potassium: 3.7 mmol/L (ref 3.5–5.1)
Sodium: 135 mmol/L (ref 135–145)

## 2023-08-30 LAB — CK: Total CK: 686 U/L — ABNORMAL HIGH (ref 38–234)

## 2023-08-30 LAB — HEMOGLOBIN A1C
Hgb A1c MFr Bld: 6.7 % — ABNORMAL HIGH (ref 4.8–5.6)
Mean Plasma Glucose: 146 mg/dL

## 2023-08-30 LAB — GLUCOSE, CAPILLARY: Glucose-Capillary: 93 mg/dL (ref 70–99)

## 2023-08-30 LAB — MAGNESIUM: Magnesium: 1.9 mg/dL (ref 1.7–2.4)

## 2023-08-30 MED ORDER — NIRMATRELVIR/RITONAVIR (PAXLOVID) TABLET (RENAL DOSING): 2.0000 | ORAL_TABLET | Freq: Two times a day (BID) | ORAL | 0 refills | Status: AC

## 2023-08-30 MED ORDER — COMBIVENT RESPIMAT 20-100 MCG/ACT IN AERS: 1.0000 | INHALATION_SPRAY | Freq: Four times a day (QID) | RESPIRATORY_TRACT | 1 refills | Status: AC | PRN

## 2023-08-30 MED ORDER — DM-GUAIFENESIN ER 30-600 MG PO TB12: 1.0000 | ORAL_TABLET | Freq: Two times a day (BID) | ORAL | 0 refills | Status: AC

## 2023-08-30 NOTE — Plan of Care (Signed)
  Problem: Clinical Measurements: Goal: Ability to maintain clinical measurements within normal limits will improve Outcome: Progressing Goal: Diagnostic test results will improve Outcome: Progressing   Problem: Pain Managment: Goal: General experience of comfort will improve and/or be controlled Outcome: Progressing   Problem: Safety: Goal: Ability to remain free from injury will improve Outcome: Progressing

## 2023-08-30 NOTE — Discharge Summary (Signed)
 Physician Discharge Summary  Stephanie Moreno UMP:[id number] DOB: 1945/07/12 DOA: 08/27/2023  PCP: Benita Stabile, MD  Admit date: 08/27/2023  Discharge date: 08/30/2023  Admitted From:Home  Disposition:  Home  Recommendations for Outpatient Follow-up:  Follow up with PCP in 1-2 weeks Continue on Paxlovid as prescribed for 3 more days to complete course of treatment Continue other home medications as prior  Home Health: Yes with PT  Equipment/Devices: None  Discharge Condition:Stable  CODE STATUS: Full  Diet recommendation: Heart Healthy/carb modified  Brief/Interim Summary: Stephanie Moreno is a 78 y.o. female with medical history significant of hypertension, type 2 diabetes mellitus, CKD 3B, GERD who presents to the emergency department from home via EMS due to falling at home..  Patient complained of several days of onset of not feeling well, she complained of cough, generalized weakness, decreased appetite.  She has been admitted with COVID-19 pneumonia as well as electrolyte abnormalities of hypokalemia and hypomagnesemia.  PT/OT evaluation recommending home health services.  She was noted to have elevated CK levels suggestive of mild rhabdomyolysis that is improved with the use of IV fluid.  Creatinine levels remained stable and she is eager for discharge today.  She will need to remain on Paxlovid for 3 more days to complete 5-day course of treatment for COVID pneumonia.  No other acute events or concerns noted.  Discharge Diagnoses:  Principal Problem:   COVID-19 virus infection Active Problems:   Hypokalemia   Recurrent falls   Elevated CK   Obesity, Class I, BMI 30-34.9  Principal/diagnosis: Recurrent falls with mild rhabdomyolysis in the setting of COVID-19 pneumonia.  Discharge Instructions  Discharge Instructions     Diet - low sodium heart healthy   Complete by: As directed    Increase activity slowly   Complete by: As directed       Allergies as of 08/30/2023        Reactions   Codeine Anaphylaxis, Nausea And Vomiting   Carvedilol    Cold sweats, diarrhea, severe hot flashes   Hydrochlorothiazide Other (See Comments)   stomach upset   Lisinopril Nausea And Vomiting   cough   Oxybutynin Other (See Comments)   Penicillins Hives        Medication List     TAKE these medications    acetaminophen 325 MG tablet Commonly known as: TYLENOL Take 650 mg by mouth every 4 (four) hours as needed for mild pain (pain score 1-3).   amLODipine 10 MG tablet Commonly known as: NORVASC Take 5 mg by mouth daily.   Azelastine HCl 137 MCG/SPRAY Soln Place 2 sprays into both nostrils 2 (two) times daily.   Combivent Respimat 20-100 MCG/ACT Aers respimat Generic drug: Ipratropium-Albuterol Inhale 1 puff into the lungs every 6 (six) hours as needed.   dextromethorphan-guaiFENesin 30-600 MG 12hr tablet Commonly known as: MUCINEX DM Take 1 tablet by mouth 2 (two) times daily for 5 days.   fluticasone 220 MCG/ACT inhaler Commonly known as: FLOVENT HFA Inhale 1 puff into the lungs 2 (two) times daily.   fluticasone furoate-vilanterol 100-25 MCG/ACT Aepb Commonly known as: BREO ELLIPTA Inhale 1 puff into the lungs daily.   FreeStyle Libre 2 Reader Hardie Pulley USE TO CHECK GLUCOSE AS DIRECTED   FreeStyle Libre 3 Reader [facility] Use the Reader to monitor your blood sugar readings daily   FreeStyle Libre 2 Sensor Misc 2 each by Does not apply route every 14 (fourteen) days.   FreeStyle Libre 3 Sensor Misc 1 Application by  Does not apply route every 14 (fourteen) days.   FreeStyle Libre 14 Day Sensor Misc APPLY NEW SENSOR EVERY 14 DAYS *NEW PRESCRIPTION REQUEST*   FreeStyle Libre 3 Plus Sensor Misc Change sensor every 15 days.   furosemide 20 MG tablet Commonly known as: LASIX Take 1 tablet (20 mg total) by mouth every other day. Takes 40 mg every other day. On alternate days take 80 mg What changed:  when to take this additional instructions    hydrALAZINE 50 MG tablet Commonly known as: APRESOLINE Take 50 mg by mouth 2 (two) times daily.   HYDROcodone-acetaminophen 7.5-325 MG tablet Commonly known as: NORCO Take 1 tablet by mouth 2 (two) times daily as needed for moderate pain (pain score 4-6).   Insulin Pen Needle 31G X 6 MM Misc 1 each by Does not apply route daily.   BD Pen Needle Micro U/F 32G X 6 MM Misc Generic drug: Insulin Pen Needle USE AS DIRECTED ONCE DAILY *NEW PRESCRIPTION REQUEST*   levocetirizine 5 MG tablet Commonly known as: XYZAL TAKE 1 TABLET BY MOUTH EVERY EVENING AS NEEDED FOR ALLERGIES What changed: See the new instructions.   lidocaine 5 % Commonly known as: LIDODERM Place 1 patch onto the skin daily as needed (pain).   losartan 25 MG tablet Commonly known as: COZAAR Take 25 mg by mouth daily.   losartan 50 MG tablet Commonly known as: COZAAR Take 50 mg by mouth daily.   lubiprostone 8 MCG capsule Commonly known as: AMITIZA Take 1 capsule (8 mcg total) by mouth 2 (two) times daily with a meal.   meloxicam 7.5 MG tablet Commonly known as: MOBIC Take 1 tablet (7.5 mg total) by mouth 2 (two) times daily.   mirabegron ER 50 MG Tb24 tablet Commonly known as: MYRBETRIQ TAKE 1 TABLET BY MOUTH EVERY DAY AT 9 AM *NEW PRESCRIPTION REQUEST*   nirmatrelvir/ritonavir (renal dosing) 10 x 150 MG & 10 x 100MG  Tabs Commonly known as: PAXLOVID Take 2 tablets by mouth 2 (two) times daily for 3 days. Patient GFR is 45. Take nirmatrelvir (150 mg) one tablet twice daily for 5 days and ritonavir (100 mg) one tablet twice daily for 5 days.   omeprazole 40 MG capsule Commonly known as: PRILOSEC Take 1 capsule (40 mg total) by mouth daily before breakfast.   ondansetron 4 MG tablet Commonly known as: ZOFRAN Take 1 tablet (4 mg total) by mouth every 8 (eight) hours as needed for nausea or vomiting.   OneTouch Verio w/Device Kit Use to check glucose twice daily, use as backup to CGM   OVER THE  COUNTER MEDICATION Prevagine one per day for memory.   pantoprazole 40 MG tablet Commonly known as: PROTONIX Take 1 tablet (40 mg total) by mouth 2 (two) times daily before a meal.   rosuvastatin 10 MG tablet Commonly known as: CRESTOR Take 10 mg by mouth daily.   sucralfate 1 g tablet Commonly known as: CARAFATE Take 1 tablet (1 g total) by mouth 4 (four) times daily -  with meals and at bedtime.   Tresiba 100 UNIT/ML Soln Generic drug: Insulin Degludec Inject 12 Units into the skin at bedtime.   Trulicity 3 MG/0.5ML Soaj Generic drug: Dulaglutide Inject 3 mg into the skin once a week.        Follow-up Information     [facility] Follow up.   Why: Agency will call to set up first home visit.        Nita Sells  Z, MD. Schedule an appointment as soon as possible for a visit in 1 week(s).   Specialty: Internal Medicine Contact information: [address] [place] [address] [phone]                Allergies  Allergen Reactions   Codeine Anaphylaxis and Nausea And Vomiting   Carvedilol     Cold sweats, diarrhea, severe hot flashes   Hydrochlorothiazide Other (See Comments)    stomach upset   Lisinopril Nausea And Vomiting    cough   Oxybutynin Other (See Comments)   Penicillins Hives    Consultations: None   Procedures/Studies: DG Chest Port 1 View Result Date: 08/28/2023 CLINICAL DATA:  COVID positive, cough EXAM: PORTABLE CHEST 1 VIEW COMPARISON:  None Available. FINDINGS: Mild right lower lobe opacity, suspicious for pneumonia. Left lung is clear. No pleural effusion or pneumothorax. The heart is normal in size. IMPRESSION: Mild right lower lobe opacity, suspicious for pneumonia. Electronically Signed   By: Charline Bills M.D.   On: 08/28/2023 00:45   CT Head Wo Contrast Result Date: 08/28/2023 CLINICAL DATA:  Multiple falls, weakness, headache EXAM: CT HEAD WITHOUT CONTRAST TECHNIQUE: Contiguous axial images were obtained  from the base of the skull through the vertex without intravenous contrast. RADIATION DOSE REDUCTION: This exam was performed according to the departmental dose-optimization program which includes automated exposure control, adjustment of the mA and/or kV according to patient size and/or use of iterative reconstruction technique. COMPARISON:  03/16/2022 FINDINGS: Brain: No evidence of acute infarction, hemorrhage, hydrocephalus, extra-axial collection or mass lesion/mass effect. Subcortical white matter and periventricular small vessel ischemic changes. Vascular: Intracranial atherosclerosis. Skull: Normal. Negative for fracture or focal lesion. Sinuses/Orbits: The visualized paranasal sinuses are essentially clear. The mastoid air cells are unopacified. Other: None. IMPRESSION: No acute intracranial abnormality. Small vessel ischemic changes. Electronically Signed   By: Charline Bills M.D.   On: 08/28/2023 00:38   MM 3D SCREENING MAMMOGRAM BILATERAL BREAST Result Date: 08/17/2023 CLINICAL DATA:  Screening. EXAM: DIGITAL SCREENING BILATERAL MAMMOGRAM WITH TOMOSYNTHESIS AND CAD TECHNIQUE: Bilateral screening digital craniocaudal and mediolateral oblique mammograms were obtained. Bilateral screening digital breast tomosynthesis was performed. The images were evaluated with computer-aided detection. COMPARISON:  Previous exam(s). ACR Breast Density Category b: There are scattered areas of fibroglandular density. FINDINGS: In the left axilla, a possible mass warrants further evaluation. In the right breast, no findings suspicious for malignancy. IMPRESSION: Further evaluation is suggested for possible mass in the left axilla. RECOMMENDATION: Ultrasound of the left axilla. (Code:[id number]) The patient will be contacted regarding the findings, and additional imaging will be scheduled. BI-RADS CATEGORY  0: Incomplete: Need additional imaging evaluation. Electronically Signed   By: Baird Lyons M.D.   On: 08/17/2023  12:36     Discharge Exam: Vitals:   08/30/23 0332 08/30/23 0832  BP: 137/84   Pulse: 97   Resp: 17   Temp: 98.5 F (36.9 C)   SpO2: 94% 97%   Vitals:   08/29/23 2127 08/29/23 2147 08/30/23 0332 08/30/23 0832  BP:  (!) 135/59 137/84   Pulse:  92 97   Resp:  20 17   Temp:  97.9 F (36.6 C) 98.5 F (36.9 C)   TempSrc:  Oral    SpO2: 98% 95% 94% 97%  Weight:      Height:        General: Pt is alert, awake, not in acute distress Cardiovascular: RRR, S1/S2 +, no rubs, no gallops Respiratory: CTA bilaterally,  no wheezing, no rhonchi Abdominal: Soft, NT, ND, bowel sounds + Extremities: no edema, no cyanosis    The results of significant diagnostics from this hospitalization (including imaging, microbiology, ancillary and laboratory) are listed below for reference.     Microbiology: Recent Results (from the past 240 hours)  Resp panel by RT-PCR (RSV, Flu A&B, Covid) Anterior Nasal Swab     Status: Abnormal   Collection Time: 08/27/23  9:27 PM   Specimen: Anterior Nasal Swab  Result Value Ref Range Status   SARS Coronavirus 2 by RT PCR POSITIVE (A) NEGATIVE Final    Comment: (NOTE) SARS-CoV-2 target nucleic acids are DETECTED.  The SARS-CoV-2 RNA is generally detectable in upper respiratory specimens during the acute phase of infection. Positive results are indicative of the presence of the identified virus, but do not rule out bacterial infection or co-infection with other pathogens not detected by the test. Clinical correlation with patient history and other diagnostic information is necessary to determine patient infection status. The expected result is Negative.  Fact Sheet for Patients: BloggerCourse.com  Fact Sheet for Healthcare Providers: SeriousBroker.it  This test is not yet approved or cleared by the [place] FDA and  has been authorized for detection and/or diagnosis of SARS-CoV-2 by FDA under an  Emergency Use Authorization (EUA).  This EUA will remain in effect (meaning this test can be used) for the duration of  the COVID-19 declaration under Section 564(b)(1) of the A ct, 21 U.S.C. section 360bbb-3(b)(1), unless the authorization is terminated or revoked sooner.     Influenza A by PCR NEGATIVE NEGATIVE Final   Influenza B by PCR NEGATIVE NEGATIVE Final    Comment: (NOTE) The Xpert Xpress SARS-CoV-2/FLU/RSV plus assay is intended as an aid in the diagnosis of influenza from Nasopharyngeal swab specimens and should not be used as a sole basis for treatment. Nasal washings and aspirates are unacceptable for Xpert Xpress SARS-CoV-2/FLU/RSV testing.  Fact Sheet for Patients: BloggerCourse.com  Fact Sheet for Healthcare Providers: SeriousBroker.it  This test is not yet approved or cleared by the [place] FDA and has been authorized for detection and/or diagnosis of SARS-CoV-2 by FDA under an Emergency Use Authorization (EUA). This EUA will remain in effect (meaning this test can be used) for the duration of the COVID-19 declaration under Section 564(b)(1) of the Act, 21 U.S.C. section 360bbb-3(b)(1), unless the authorization is terminated or revoked.     Resp Syncytial Virus by PCR NEGATIVE NEGATIVE Final    Comment: (NOTE) Fact Sheet for Patients: BloggerCourse.com  Fact Sheet for Healthcare Providers: SeriousBroker.it  This test is not yet approved or cleared by the [place] FDA and has been authorized for detection and/or diagnosis of SARS-CoV-2 by FDA under an Emergency Use Authorization (EUA). This EUA will remain in effect (meaning this test can be used) for the duration of the COVID-19 declaration under Section 564(b)(1) of the Act, 21 U.S.C. section 360bbb-3(b)(1), unless the authorization is terminated or revoked.  Performed at [facility],  [address] [place], [place] [address]      Labs: BNP (last 3 results) No results for input(s): "BNP" in the last 8760 hours. Basic Metabolic Panel: Recent Labs  Lab 08/27/23 2148 08/28/23 0520 08/29/23 0321 08/30/23 0330  NA 139 138 137 135  K 3.0* 2.7* 4.3 3.7  CL 106 105 106 106  CO2 20* 22 21* 20*  GLUCOSE 111* 107* 98 90  BUN 16 14 13 15   CREATININE 1.51* 1.29* 1.30* 1.23*  CALCIUM 9.3 8.9 9.1 8.6*  MG  --  1.5* 1.9 1.9  PHOS  --  3.2  --   --    Liver Function Tests: Recent Labs  Lab 08/28/23 0520  AST 33  ALT 20  ALKPHOS 51  BILITOT 0.7  PROT 7.5  ALBUMIN 3.6   No results for input(s): "LIPASE", "AMYLASE" in the last 168 hours. No results for input(s): "AMMONIA" in the last 168 hours. CBC: Recent Labs  Lab 08/27/23 2148 08/28/23 0520 08/29/23 0321  WBC 8.1 7.8 8.6  HGB 12.2 11.6* 11.7*  HCT 37.1 35.9* 36.5  MCV 84.1 84.5 84.9  PLT 253 243 244   Cardiac Enzymes: Recent Labs  Lab 08/27/23 2148 08/28/23 0747 08/29/23 0321 08/30/23 0330  CKTOTAL 896* 916* 916* 686*   BNP: Invalid input(s): "POCBNP" CBG: Recent Labs  Lab 08/29/23 0715 08/29/23 1106 08/29/23 1630 08/29/23 2201 08/30/23 0716  GLUCAP 98 136* 112* 127* 93   D-Dimer No results for input(s): "DDIMER" in the last 72 hours. Hgb A1c Recent Labs    08/28/23 0520  HGBA1C 6.7*   Lipid Profile No results for input(s): "CHOL", "HDL", "LDLCALC", "TRIG", "CHOLHDL", "LDLDIRECT" in the last 72 hours. Thyroid function studies No results for input(s): "TSH", "T4TOTAL", "T3FREE", "THYROIDAB" in the last 72 hours.  Invalid input(s): "FREET3" Anemia work up No results for input(s): "VITAMINB12", "FOLATE", "FERRITIN", "TIBC", "IRON", "RETICCTPCT" in the last 72 hours. Urinalysis    Component Value Date/Time   COLORURINE YELLOW 08/27/2023 2310   APPEARANCEUR CLEAR 08/27/2023 2310   APPEARANCEUR Clear 07/20/2023 1131   LABSPEC 1.010 08/27/2023 2310   PHURINE 6.0 08/27/2023 2310    GLUCOSEU NEGATIVE 08/27/2023 2310   HGBUR MODERATE (A) 08/27/2023 2310   BILIRUBINUR NEGATIVE 08/27/2023 2310   BILIRUBINUR Negative 07/20/2023 1131   KETONESUR NEGATIVE 08/27/2023 2310   PROTEINUR 100 (A) 08/27/2023 2310   UROBILINOGEN 0.2 03/13/2022 1322   UROBILINOGEN 0.2 01/01/2007 1207   NITRITE NEGATIVE 08/27/2023 2310   LEUKOCYTESUR NEGATIVE 08/27/2023 2310   Sepsis Labs Recent Labs  Lab 08/27/23 2148 08/28/23 0520 08/29/23 0321  WBC 8.1 7.8 8.6   Microbiology Recent Results (from the past 240 hours)  Resp panel by RT-PCR (RSV, Flu A&B, Covid) Anterior Nasal Swab     Status: Abnormal   Collection Time: 08/27/23  9:27 PM   Specimen: Anterior Nasal Swab  Result Value Ref Range Status   SARS Coronavirus 2 by RT PCR POSITIVE (A) NEGATIVE Final    Comment: (NOTE) SARS-CoV-2 target nucleic acids are DETECTED.  The SARS-CoV-2 RNA is generally detectable in upper respiratory specimens during the acute phase of infection. Positive results are indicative of the presence of the identified virus, but do not rule out bacterial infection or co-infection with other pathogens not detected by the test. Clinical correlation with patient history and other diagnostic information is necessary to determine patient infection status. The expected result is Negative.  Fact Sheet for Patients: BloggerCourse.com  Fact Sheet for Healthcare Providers: SeriousBroker.it  This test is not yet approved or cleared by the [place] FDA and  has been authorized for detection and/or diagnosis of SARS-CoV-2 by FDA under an Emergency Use Authorization (EUA).  This EUA will remain in effect (meaning this test can be used) for the duration of  the COVID-19 declaration under Section 564(b)(1) of the A ct, 21 U.S.C. section 360bbb-3(b)(1), unless the authorization is terminated or revoked sooner.     Influenza A by PCR NEGATIVE NEGATIVE Final  Influenza B by PCR NEGATIVE NEGATIVE Final    Comment: (NOTE) The Xpert Xpress SARS-CoV-2/FLU/RSV plus assay is intended as an aid in the diagnosis of influenza from Nasopharyngeal swab specimens and should not be used as a sole basis for treatment. Nasal washings and aspirates are unacceptable for Xpert Xpress SARS-CoV-2/FLU/RSV testing.  Fact Sheet for Patients: BloggerCourse.com  Fact Sheet for Healthcare Providers: SeriousBroker.it  This test is not yet approved or cleared by the [place] FDA and has been authorized for detection and/or diagnosis of SARS-CoV-2 by FDA under an Emergency Use Authorization (EUA). This EUA will remain in effect (meaning this test can be used) for the duration of the COVID-19 declaration under Section 564(b)(1) of the Act, 21 U.S.C. section 360bbb-3(b)(1), unless the authorization is terminated or revoked.     Resp Syncytial Virus by PCR NEGATIVE NEGATIVE Final    Comment: (NOTE) Fact Sheet for Patients: BloggerCourse.com  Fact Sheet for Healthcare Providers: SeriousBroker.it  This test is not yet approved or cleared by the [place] FDA and has been authorized for detection and/or diagnosis of SARS-CoV-2 by FDA under an Emergency Use Authorization (EUA). This EUA will remain in effect (meaning this test can be used) for the duration of the COVID-19 declaration under Section 564(b)(1) of the Act, 21 U.S.C. section 360bbb-3(b)(1), unless the authorization is terminated or revoked.  Performed at [facility], [address], [place], [place] [address]      Time coordinating discharge: 35 minutes  SIGNED:   Erick Blinks, DO [facility] 08/30/2023, 9:11 AM  If 7PM-7AM, please contact night-coverage [email or web address]

## 2023-08-30 NOTE — Plan of Care (Signed)
 discharged

## 2023-08-30 NOTE — Evaluation (Signed)
 Occupational Therapy Evaluation Patient Details Name: Stephanie Moreno MRN: [id number] DOB: [birth year] Today's Date: 08/30/2023   History of Present Illness   Stephanie Moreno is a 78 y.o. female with medical history significant of hypertension, type 2 diabetes mellitus, CKD 3B, GERD who presents to the emergency department from home via EMS due to falling at home..  Patient complained of several days of onset of not feeling well, she complained of cough, generalized weakness, decreased appetite.  She complained of sustaining a fall at home this morning around 10:30 AM without hurting self due to her legs giving out, she called her niece who activated EMS, but patient refused to go to the hospital at that time.  She later on sustained another fall around 5:30 PM and was on the floor for about 2 hours, she denies loss of consciousness or hitting her head.  Daughter then insisted that she needs to go to the ED for further evaluation and management.  EMS was activated and she was taken to the ED.     Clinical Impressions Pt agreeable to OT evaluation. Pt reports feeling back to normal. B UE general weakness noted. Supervision assist for mobility with RW within the room. Use of grab bars for toilet transfer. Pt reports being interested in general strengthening via home health therapy. Pt left in the chair with call bell within reach. Pt is not recommended for further acute OT services and will be discharged to care of nursing staff for remaining length of stay.        If plan is discharge home, recommend the following:   A little help with walking and/or transfers;Assist for transportation;Help with stairs or ramp for entrance     Functional Status Assessment   Patient has had a recent decline in their functional status and demonstrates the ability to make significant improvements in function in a reasonable and predictable amount of time.     Equipment Recommendations   None recommended by  OT             Precautions/Restrictions   Precautions Precautions: Fall Recall of Precautions/Restrictions: Intact Restrictions Weight Bearing Restrictions Per Provider Order: No     Mobility Bed Mobility Overal bed mobility: Independent                  Transfers Overall transfer level: Needs assistance Equipment used: Rolling walker (2 wheels) Transfers: Sit to/from Stand, Bed to chair/wheelchair/BSC Sit to Stand: Supervision     Step pivot transfers: Supervision     General transfer comment: mild labored movement; no physical assist      Balance Overall balance assessment: Needs assistance Sitting-balance support: Feet supported, No upper extremity supported Sitting balance-Leahy Scale: Good Sitting balance - Comments: seated in chair   Standing balance support: Bilateral upper extremity supported, During functional activity, Reliant on assistive device for balance Standing balance-Leahy Scale: Fair Standing balance comment: using RW                           ADL either performed or assessed with clinical judgement   ADL Overall ADL's : Needs assistance/impaired     Grooming: Supervision/safety;Standing   Upper Body Bathing: Modified independent;Sitting   Lower Body Bathing: Supervison/ safety;Modified independent;Sitting/lateral leans   Upper Body Dressing : Modified independent;Supervision/safety   Lower Body Dressing: Modified independent;Supervision/safety;Sitting/lateral leans Lower Body Dressing Details (indicate cue type and reason): Able to doff and don sock seated in chair without physical  assist. Toilet Transfer: Supervision/safety;Rolling walker (2 wheels);Ambulation Toilet Transfer Details (indicate cue type and reason): EOB to toilet and back. Toileting- Clothing Manipulation and Hygiene: Supervision/safety;Sitting/lateral lean       Functional mobility during ADLs: Supervision/safety;Rolling walker (2 wheels)        Vision Baseline Vision/History: 0 No visual deficits Ability to See in Adequate Light: 0 Adequate Patient Visual Report: No change from baseline Vision Assessment?: No apparent visual deficits     Perception Perception: Not tested       Praxis Praxis: Not tested       Pertinent Vitals/Pain Pain Assessment Pain Assessment: No/denies pain     Extremity/Trunk Assessment Upper Extremity Assessment Upper Extremity Assessment: Generalized weakness   Lower Extremity Assessment Lower Extremity Assessment: Defer to PT evaluation   Cervical / Trunk Assessment Cervical / Trunk Assessment: Normal   Communication Communication Communication: No apparent difficulties   Cognition Arousal: Alert Behavior During Therapy: WFL for tasks assessed/performed Cognition: No apparent impairments                               Following commands: Intact       Cueing  General Comments   Cueing Techniques: Verbal cues                 Home Living Family/patient expects to be discharged to:: Private residence Living Arrangements: Alone Available Help at Discharge: Family;Available 24 hours/day Type of Home: Apartment Home Access: Stairs to enter     Home Layout: One level     Bathroom Shower/Tub: Producer, television/film/video: Handicapped height     Home Equipment: Agricultural consultant (2 wheels);Grab bars - tub/shower;BSC/3in1;Shower seat          Prior Functioning/Environment Prior Level of Function : Needs assist       Physical Assist : ADLs (physical)   ADLs (physical): IADLs Mobility Comments: Household and short distanced community ambulation using RW, does not drive (per PT) ADLs Comments: Indepedent for household ADLs, assisted by family for community     End of Session Equipment Utilized During Treatment: Rolling walker (2 wheels)  Activity Tolerance: Patient tolerated treatment well Patient left: with call bell/phone within reach;in  chair  OT Visit Diagnosis: Unsteadiness on feet (R26.81);Other abnormalities of gait and mobility (R26.89);Muscle weakness (generalized) (M62.81)                Time: 8295-6213 OT Time Calculation (min): 9 min Charges:  OT General Charges $OT Visit: 1 Visit OT Evaluation $OT Eval Low Complexity: 1 Low  Asenath Balash OT, MOT   Danie Chandler 08/30/2023, 9:15 AM

## 2023-08-30 NOTE — TOC Transition Note (Signed)
 Transition of Care [facility]) - Discharge Note   Patient Details  Name: Stephanie Moreno MRN: [id number] Date of Birth: 1945-07-07  Transition of [facility] CM/SW Contact:  Villa Herb, LCSWA Phone Number: 08/30/2023, 11:19 AM   Clinical Narrative:    CSW updated that pt is discharging home today. CSW updated [place] with [facility] of plan for D/C. MD placed HH orders. TOC signing off.   Final next level of care: Home w Home Health Services Barriers to Discharge: Barriers Resolved   Patient Goals and CMS Choice Patient states their goals for this hospitalization and ongoing recovery are:: return home CMS Medicare.gov Compare Post Acute Care list provided to:: Patient Choice offered to / list presented to : Patient      Discharge Placement                       Discharge Plan and Services Additional resources added to the After Visit Summary for   In-house Referral: Clinical Social Work Discharge Planning Services: CM Consult Post Acute Care Choice: Home Health                    HH Arranged: PT [facility] Agency: [facility] Date [facility] Agency Contacted: 08/30/23   Representative spoke with at [facility] Agency: Adele Dan  Social Drivers of Health (SDOH) Interventions SDOH Screenings   Food Insecurity: No Food Insecurity (08/28/2023)  Housing: Low Risk  (08/28/2023)  Transportation Needs: Unmet Transportation Needs (08/28/2023)  Utilities: Not At Risk (08/28/2023)  Depression (PHQ2-9): Medium Risk (12/15/2020)  Social Connections: Moderately Integrated (08/28/2023)  Tobacco Use: Low Risk  (08/28/2023)     Readmission Risk Interventions    08/30/2023   11:18 AM 08/29/2023    1:22 PM  Readmission Risk Prevention Plan  Transportation Screening Complete Complete  Home Care Screening  Complete  Medication Review (RN CM)  Complete  HRI or Home Care Consult Complete   Social Work Consult for Recovery Care Planning/Counseling Complete   Palliative Care Screening  Not Applicable   Medication Review Oceanographer) Complete

## 2023-08-30 NOTE — Care Management Important Message (Signed)
 Important Message  Patient Details  Name: Stephanie Moreno MRN: [id number] Date of Birth: May 07, 1946   Important Message Given:  Yes - Medicare IM     Corey Harold 08/30/2023, 10:17 AM

## 2023-08-31 ENCOUNTER — Telehealth: Payer: Self-pay

## 2023-08-31 NOTE — Transitions of Care (Post Inpatient/ED Visit) (Signed)
 08/31/2023  Name: Stephanie Moreno MRN: [id number] DOB: 10-17-1945  Today's TOC FU Call Status: Today's TOC FU Call Status:: Successful TOC FU Call Completed TOC FU Call Complete Date: 08/31/23 Patient's Name and Date of Birth confirmed.  Transition Care Management Follow-up Telephone Call Date of Discharge: 08/30/23 [facility] Type of Discharge: Inpatient Admission Primary Inpatient Discharge Diagnosis:: COVID-19 virus infection How have you been since you were released from the hospital?: Same Any questions or concerns?: No  Items Reviewed: Did you receive and understand the discharge instructions provided?: Yes Medications obtained,verified, and reconciled?: Yes (Medications Reviewed) Any new allergies since your discharge?: No Dietary orders reviewed?: Yes Type of Diet Ordered:: Reg Heart Healthy Do you have support at home?: Yes People in Home: alone Name of Support/Comfort Primary Source: Daughter and Niece  Medications Reviewed Today: Medications Reviewed Today     Reviewed by Johnnette Barrios, RN (Registered Nurse) on 08/31/23 at 1155  Med List Status: <None>   Medication Order Taking? Sig Documenting Provider Last Dose Status Informant  acetaminophen (TYLENOL) 325 MG tablet [id number] Yes Take 650 mg by mouth every 4 (four) hours as needed for mild pain (pain score 1-3). [provider] Taking Active Self  amLODipine (NORVASC) 10 MG tablet [id number] Yes Take 5 mg by mouth daily. [provider] Taking Active Self  Azelastine HCl 137 MCG/SPRAY SOLN [id number] Yes Place 2 sprays into both nostrils 2 (two) times daily. [provider] Taking Active Self  Blood Glucose Monitoring Suppl [facility] VERIO) w/Device KIT [id number] Yes Use to check glucose twice daily, use as backup to CGM Dani Gobble, NP Taking Active Self  Continuous Blood Gluc Sensor (FREESTYLE LIBRE 2 SENSOR) MISC [id number] Yes 2 each by Does not apply route  every 14 (fourteen) days. Dani Gobble, NP Taking Active Self  Continuous Glucose Receiver (FREESTYLE LIBRE 2 READER) DEVI [id number] Yes USE TO CHECK GLUCOSE AS DIRECTED Roma Kayser, MD Taking Active Self  Continuous Glucose Receiver (FREESTYLE LIBRE 3 READER) DEVI [id number] Yes Use the Reader to monitor your blood sugar readings daily Lurlean Leyden, Freddi Starr, NP Taking Active Self  Continuous Glucose Sensor (FREESTYLE LIBRE 14 DAY SENSOR) MISC [id number] Yes APPLY NEW SENSOR EVERY 14 DAYS *NEW PRESCRIPTION REQUEST* Dani Gobble, NP Taking Active Self  Continuous Glucose Sensor (FREESTYLE LIBRE 3 PLUS SENSOR) MISC [id number] Yes Change sensor every 15 days. Dani Gobble, NP Taking Active Self  Continuous Glucose Sensor (FREESTYLE LIBRE 3 SENSOR) [place] [id number] Yes 1 Application by Does not apply route every 14 (fourteen) days. Dani Gobble, NP Taking Active Self  dextromethorphan-guaiFENesin [facility] DM) 30-600 MG 12hr tablet [id number] Yes Take 1 tablet by mouth 2 (two) times daily for 5 days. Sherryll Burger, Pratik D, DO Taking Active   Dulaglutide (TRULICITY) 3 MG/0.5ML Ivory Broad [id number] Yes Inject 3 mg into the skin once a week. Dani Gobble, NP Taking Active Self  fluticasone (FLOVENT HFA) 220 MCG/ACT inhaler [id number] Yes Inhale 1 puff into the lungs 2 (two) times daily. [provider] Taking Active Self  fluticasone furoate-vilanterol (BREO ELLIPTA) 100-25 MCG/ACT AEPB [id number] Yes Inhale 1 puff into the lungs daily. Dani Gobble, NP Taking Active Self  furosemide (LASIX) 20 MG tablet [id number] Yes Take 1 tablet (20 mg total) by mouth every other day. Takes 40 mg every other day. On alternate days take 80 mg  Patient taking differently: Take 20 mg by mouth 2 (two) times daily.   Johnson, Clanford  L, MD Taking Active Self  hydrALAZINE (APRESOLINE) 50 MG tablet [id number] Yes Take 50 mg by mouth 2 (two) times daily. [provider] Taking Active Self   HYDROcodone-acetaminophen (NORCO) 7.5-325 MG tablet [id number] Yes Take 1 tablet by mouth 2 (two) times daily as needed for moderate pain (pain score 4-6). [provider] Taking Active Self  Insulin Degludec (TRESIBA) 100 UNIT/ML SOLN [id number] Yes Inject 12 Units into the skin at bedtime. [provider] Taking Active Self  Insulin Pen Needle (BD PEN NEEDLE MICRO U/F) 32G X 6 MM MISC [id number] Yes USE AS DIRECTED ONCE DAILY *NEW PRESCRIPTION REQUEST* Reardon, Freddi Starr, NP Taking Active Self  Insulin Pen Needle 31G X 6 MM MISC [id number] Yes 1 each by Does not apply route daily. Dani Gobble, NP Taking Active Self  Ipratropium-Albuterol (COMBIVENT RESPIMAT) 20-100 MCG/ACT AERS respimat [id number] Yes Inhale 1 puff into the lungs every 6 (six) hours as needed. Sherryll Burger, Pratik D, DO Taking Active   levocetirizine (XYZAL) 5 MG tablet [id number] Yes TAKE 1 TABLET BY MOUTH EVERY EVENING AS NEEDED FOR ALLERGIES  Patient taking differently: Take 5 mg by mouth every evening.   Gelene Mink, NP Taking Active Self  lidocaine (LIDODERM) 5 % [id number] Yes Place 1 patch onto the skin daily as needed (pain). [provider] Taking Active Self           Med Note Elesa Massed, ANGELICA G   Tue Aug 28, 2023  9:21 PM)    losartan (COZAAR) 25 MG tablet [id number] Yes Take 25 mg by mouth daily. [provider] Taking Active Self           Med Note Joanne Gavel, Harding Thomure L   Wed May 17, 2022 10:06 AM)    losartan (COZAAR) 50 MG tablet [id number] Yes Take 50 mg by mouth daily. [provider] Taking Active Self  lubiprostone (AMITIZA) 8 MCG capsule [id number] Yes Take 1 capsule (8 mcg total) by mouth 2 (two) times daily with a meal. Gelene Mink, NP Taking Active Self  meloxicam (MOBIC) 7.5 MG tablet [id number] Yes Take 1 tablet (7.5 mg total) by mouth 2 (two) times daily. Vickki Hearing, MD Taking Active Self  mirabegron ER (MYRBETRIQ) 50 MG TB24 tablet [id number] Yes TAKE 1 TABLET  BY MOUTH EVERY DAY AT 9 AM *NEW PRESCRIPTION REQUEST* McKenzie, Mardene Celeste, MD Taking Active Self  nirmatrelvir/ritonavir, renal dosing, (PAXLOVID) 10 x 150 MG & 10 x 100MG  TABS [id number] Yes Take 2 tablets by mouth 2 (two) times daily for 3 days. Patient GFR is 45. Take nirmatrelvir (150 mg) one tablet twice daily for 5 days and ritonavir (100 mg) one tablet twice daily for 5 days. Sherryll Burger, Pratik D, DO Taking Active   omeprazole (PRILOSEC) 40 MG capsule [id number] Yes Take 1 capsule (40 mg total) by mouth daily before breakfast. Gelene Mink, NP Taking Active Self  ondansetron (ZOFRAN) 4 MG tablet [id number] Yes Take 1 tablet (4 mg total) by mouth every 8 (eight) hours as needed for nausea or vomiting. Gelene Mink, NP Taking Active Self  OVER THE COUNTER MEDICATION [id number] Yes Prevagine one per day for memory. [provider] Taking Active Self  pantoprazole (PROTONIX) 40 MG tablet [id number] Yes Take 1 tablet (40 mg total) by mouth 2 (two) times daily before a meal. Gelene Mink, NP Taking Active Self  rosuvastatin (CRESTOR) 10 MG tablet [id number] Yes Take 10 mg by mouth daily. [provider] Taking Active Self  sucralfate (CARAFATE) 1 g tablet [id number] Yes Take 1 tablet (1 g total) by mouth 4 (four) times daily -  with meals and at bedtime. Gelene Mink, NP Taking Active Self          Medication reconciliation / review completed based on most recent discharge summary and EHR medication list. Confirmed patient is taking all newly prescribed medications as instructed (any discrepancies are noted in review section)   Patient  is aware of any changes to and / or  any dosage adjustments to medication regimen. Patient denies questions at this time and reports no barriers to medication adherence.   Medication instructions  Continue on Paxlovid as prescribed for 3 more days to complete course of treatment Continue other home medications as prior  Home Care and  Equipment/Supplies: Were Home Health Services Ordered?: Yes Name of Home Health Agency:: [facility] Has Agency set up a time to come to your home?: No EMR reviewed for Home Health Orders: Orders present/patient has not received call (refer to CM for follow-up) (Call placed to [facility] requested to add Nursing Spoke to [place]) Any new equipment or medical supplies ordered?: No  Functional Questionnaire: Do you need assistance with bathing/showering or dressing?: Yes (due to weakness) Do you need assistance with meal preparation?: No Do you need assistance with eating?: No Do you have difficulty maintaining continence: No Do you need assistance with getting out of bed/getting out of a chair/moving?: Yes (frequent falls) Do you have difficulty managing or taking your medications?: No (Blister packs)  Follow up appointments reviewed: PCP Follow-up appointment confirmed?: No (She will call and schedule with him) MD Provider Line Number:[phone] Given: No Specialist Hospital Follow-up appointment confirmed?: NA Do you need transportation to your follow-up appointment?: No (Family transports)  SDOH Interventions Today    Flowsheet Row Most Recent Value  SDOH Interventions   Food Insecurity Interventions Intervention Not Indicated  Housing Interventions Intervention Not Indicated  Transportation Interventions Intervention Not Indicated, Patient Resources (Friends/Family)  Utilities Interventions Intervention Not Indicated       Goals Addressed             This Visit's Progress    TOC Care Plan       Current Barriers:  Medication management new medications Provider appointments PCP Home Health services [facility] [phone] Functional/Safety recent falls  RNCM Clinical Goal(s):  Patient will work with the Care Management team over the next 30 days to address Transition of Care Barriers: Medication Management Diet/Nutrition/Food Resources Support at  home Provider appointments Home Health services Functional/Safety take all medications exactly as prescribed and will call provider for medication related questions as evidenced by no missed medication doses attend all scheduled medical appointments: with PCP  as evidenced by no missed follow-up visits  demonstrate ongoing self health care management ability as evidenced by increased independence  through collaboration with RN Care manager, provider, and care team.   Interventions: Evaluation of current treatment plan related to  self management and patient's adherence to plan as established by provider  Transitions of Care:  New goal. Doctor Visits  - discussed the importance of doctor visits Post discharge activity limitations prescribed by provider reviewed Reviewed Signs and symptoms of infection  Patient Goals/Self-Care Activities: Participate in Transition of Care Program/Attend TOC scheduled calls Take all medications as prescribed Attend all scheduled provider appointments Call pharmacy for medication refills 3-7 days in advance of running out of medications Attend church or other social activities Perform all self care  activities independently  Perform IADL's (shopping, preparing meals, housekeeping, managing finances) independently Call provider office for new concerns or questions   Follow Up Plan:  Telephone follow up appointment with care management team member scheduled for:  09/07/23 @ 2:00pm The patient has been provided with contact information for the care management team and has been advised to call with any health related questions or concerns.         Patient is at high risk for readmission and/or has history of  high utilization  Discussed VBCI  TOC program and weekly calls to patient to assess condition/status, medication management  and provide support/education as indicated . Patient/ Caregiver voiced understanding and is  agreeable to 30 day program   Routine  follow-up and on-going assessment evaluation and education of disease processes, recommended interventions for both chronic and acute medical conditions , will occur during each weekly visit along with ongoing review of symptoms ,medication reviews and reconciliation. Any updates , inconsistencies, discrepancies or acute care concerns will be addressed and routed to the correct Practitioner if indicated   Call placed to [facility] Requested they add Nursing for Medication and Disease management Spoke to [facility]  Based on current information and Insurance plan -Reviewed benefits available to patient, including details about eligibility options for care if any area of needs were identified.  Reviewed patients ability to access and / or navigating the benefits system..Amb Referral made if indicted , refer to orders section of note for details   Please refer to Care Plan for goals and interventions -Effectiveness of interventions, symptom management and outcomes will be evaluated  weekly during [facility] 30-day Program Outreach calls  . Any necessary  changes and updates to Care Plan will be completed episodically    Reviewed goals for care Patient verbalizes understanding of instructions and care plan provided. Patient was encouraged to make informed decisions about their care, actively participate in managing their health condition, and implement lifestyle changes as needed to promote independence and self-management of health care   Patient was encouraged to Contact PCP with any changes in baseline or  medication regimen,  changes in health status  /  well-being, safety concerns, including falls any questions or concerns regarding ongoing medical care, any difficulty obtaining or picking up prescriptions, any changes or worsening in condition- including  symptoms not relieved  with interventions    The patient has been provided with contact information for the care management team and has been advised  to call with any health-related questions or concerns. Follow up as indicated with Care Team , or sooner should any new problems arise.   Susa Loffler , BSN, RN [facility]   VBCI-Population Health RN Care Manager Direct Dial [phone]  Fax: [phone] Website: [email or web address]

## 2023-08-31 NOTE — Patient Instructions (Signed)
 Visit Information  Thank you for taking time to visit with me today. Please don't hesitate to contact me if I can be of assistance to you before our next scheduled telephone appointment.  Our next appointment is by telephone on 09/07/23 at 2:00pm  Following is a copy of your care plan:   Goals Addressed             This Visit's Progress    TOC Care Plan       Current Barriers:  Medication management new medications Provider appointments PCP Home Health services [facility] [phone] Functional/Safety recent falls  RNCM Clinical Goal(s):  Patient will work with the Care Management team over the next 30 days to address Transition of Care Barriers: Medication Management Diet/Nutrition/Food Resources Support at home Provider appointments Home Health services Functional/Safety take all medications exactly as prescribed and will call provider for medication related questions as evidenced by no missed medication doses attend all scheduled medical appointments: with PCP  as evidenced by no missed follow-up visits  demonstrate ongoing self health care management ability as evidenced by increased independence  through collaboration with RN Care manager, provider, and care team.   Interventions: Evaluation of current treatment plan related to  self management and patient's adherence to plan as established by provider  Transitions of Care:  New goal. Doctor Visits  - discussed the importance of doctor visits Post discharge activity limitations prescribed by provider reviewed Reviewed Signs and symptoms of infection  Patient Goals/Self-Care Activities: Participate in Transition of Care Program/Attend TOC scheduled calls Take all medications as prescribed Attend all scheduled provider appointments Call pharmacy for medication refills 3-7 days in advance of running out of medications Attend church or other social activities Perform all self care activities independently   Perform IADL's (shopping, preparing meals, housekeeping, managing finances) independently Call provider office for new concerns or questions   Follow Up Plan:  Telephone follow up appointment with care management team member scheduled for:  09/07/23 @ 2:00pm The patient has been provided with contact information for the care management team and has been advised to call with any health related questions or concerns.          Medication review  Reviewed current home medications -- provided education as needed. Patient is aware of potential side effects and was encouraged to notify PCP for any adverse side effects or unwanted symptoms not relieved with interventions  Patient will call 911 for Medical Emergencies or Life -Threatening Symptoms.  Reviewed goals for care Patient/ Caregiver verbalizes understanding of instructions with the plan of care . The  Patient / Caregiver was encouraged to make informed decisions about care, actively participate in managing health conditions, and implement lifestyle changes as needed to promote independence and self-management of healthcare. SDOH screenings have been completed and addressed if indicted.  There are no reported barriers to care.    Follow-up Plan [facility] Case Management Nurse will provide follow-up and on-going assessment ,evaluation and education of disease processes, recommended interventions for both chronic and acute medical conditions ,  along with ongoing review of symptoms ,medication reviews / reconciliation during each weekly call . Any updates , inconsistencies, discrepancies or acute care concerns will be addressed and routed to the correct Practitioner if indicated   [facility]  Please call the care guide team at [phone]  if you need to cancel or reschedule your appointment . For scheduled calls -Three attempts will be made to reach you -if the scheduled  call is missed or  we are unable to reach the you after 3  attempts no additional outreach attempts will be made and the TOC follow-up will be closed .   If you need to speak to a Nurse you may  call me directly at the number below or if I am unavailable,and  your need is urgent  please call the main [facility] number at [phone] and ask to speak with one of the [facility] ( Transition of Care )  Nurses  .  Patient was encouraged to Contact PCP with any changes in baseline or  medication regimen,  changes in health status  /  well-being, safety concerns, including falls any questions or concerns regarding ongoing medical care, any difficulty obtaining or picking up prescriptions, any changes or worsening in condition- including  symptoms not relieved  with interventions                                                                            Additionally, If you experience worsening of your symptoms, develop shortness of breath, If you are experiencing a medical emergency,  develop suicidal or homicidal thoughts you must seek medical attention immediately by calling 911 or report to your local emergency department or urgent care.   If you have a non-emergency medical problem during routine business hours, please contact your provider's office and ask to speak with a nurse.       Please take the time to read instructions/literature along with the possible adverse reactions/side effects for all the Medicines that have been prescribed to you. Only take newly prescribed  Medications after you have completely understood and accept all the possible adverse reactions/side effects.   Do not take more than prescribed Medications for  Pain, Sleep and Anxiety. Do not drive when taking Pain medications or sleep aid/ insomnia  medications It is not advisable to combine anxiety, sleep and pain medications without talking with your primary care practitioner    If you are experiencing a Mental Health or Behavioral Health Crisis or need someone to talk to Please call the Suicide  and Crisis Lifeline: 988 You may also call the [place] National Suicide Prevention Lifeline: 480-652-4144 or TTY: (917)156-1327 TTY 830-664-9569) to talk to a trained counselor.  You may call the Behavioral Health Crisis Line at 514-445-1033, at any time, 24 hours a day, 7 days a week- however If you are in danger or need immediate medical attention, call 911.   If you would like help to quit smoking, call 1-800-QUIT-NOW ( 709 249 6758) OR Espaol: 1-855-Djelo-Ya (4-742-595-6387) o para ms informacin haga clic aqu or Text READY to 564-332 to register via text.   Stephanie Moreno , BSN, RN [place]   [facility]-Population Health RN Care Manager Direct Dial [phone]  Website: [email or web address]

## 2023-09-03 DIAGNOSIS — Z556 Problems related to health literacy: Secondary | ICD-10-CM | POA: Diagnosis not present

## 2023-09-03 DIAGNOSIS — N1832 Chronic kidney disease, stage 3b: Secondary | ICD-10-CM | POA: Diagnosis not present

## 2023-09-03 DIAGNOSIS — R1013 Epigastric pain: Secondary | ICD-10-CM | POA: Diagnosis not present

## 2023-09-03 DIAGNOSIS — E1122 Type 2 diabetes mellitus with diabetic chronic kidney disease: Secondary | ICD-10-CM | POA: Diagnosis not present

## 2023-09-03 DIAGNOSIS — M47817 Spondylosis without myelopathy or radiculopathy, lumbosacral region: Secondary | ICD-10-CM | POA: Diagnosis not present

## 2023-09-03 DIAGNOSIS — G4700 Insomnia, unspecified: Secondary | ICD-10-CM | POA: Diagnosis not present

## 2023-09-03 DIAGNOSIS — I129 Hypertensive chronic kidney disease with stage 1 through stage 4 chronic kidney disease, or unspecified chronic kidney disease: Secondary | ICD-10-CM | POA: Diagnosis not present

## 2023-09-03 DIAGNOSIS — R1310 Dysphagia, unspecified: Secondary | ICD-10-CM | POA: Diagnosis not present

## 2023-09-03 DIAGNOSIS — K5900 Constipation, unspecified: Secondary | ICD-10-CM | POA: Diagnosis not present

## 2023-09-03 DIAGNOSIS — Z853 Personal history of malignant neoplasm of breast: Secondary | ICD-10-CM | POA: Diagnosis not present

## 2023-09-03 DIAGNOSIS — E876 Hypokalemia: Secondary | ICD-10-CM | POA: Diagnosis not present

## 2023-09-03 DIAGNOSIS — Z794 Long term (current) use of insulin: Secondary | ICD-10-CM | POA: Diagnosis not present

## 2023-09-03 DIAGNOSIS — E041 Nontoxic single thyroid nodule: Secondary | ICD-10-CM | POA: Diagnosis not present

## 2023-09-03 DIAGNOSIS — U071 COVID-19: Secondary | ICD-10-CM | POA: Diagnosis not present

## 2023-09-03 DIAGNOSIS — D509 Iron deficiency anemia, unspecified: Secondary | ICD-10-CM | POA: Diagnosis not present

## 2023-09-03 DIAGNOSIS — J1282 Pneumonia due to coronavirus disease 2019: Secondary | ICD-10-CM | POA: Diagnosis not present

## 2023-09-03 DIAGNOSIS — M48061 Spinal stenosis, lumbar region without neurogenic claudication: Secondary | ICD-10-CM | POA: Diagnosis not present

## 2023-09-03 DIAGNOSIS — E782 Mixed hyperlipidemia: Secondary | ICD-10-CM | POA: Diagnosis not present

## 2023-09-03 DIAGNOSIS — K219 Gastro-esophageal reflux disease without esophagitis: Secondary | ICD-10-CM | POA: Diagnosis not present

## 2023-09-04 ENCOUNTER — Ambulatory Visit (HOSPITAL_COMMUNITY): Admission: RE | Admit: 2023-09-04 | Payer: 59 | Source: Ambulatory Visit

## 2023-09-07 ENCOUNTER — Other Ambulatory Visit: Payer: Self-pay

## 2023-09-07 NOTE — Patient Outreach (Signed)
 Care Management  Transitions of Care Program Transitions of Care Post-discharge week 2  09/07/2023 Name: Stephanie Moreno MRN: [id number] DOB: 1945/08/28  Subjective: Stephanie Moreno is a 78 y.o. year old female who is a primary care patient of Margo Aye, Kathleene Hazel, MD. The Care Management team was unable to reach the patient by phone to assess and address transitions of care needs.   Patient  Outreach attempt is in the course of  [facility]BCI  30-day TOC program. Pt previously agreed and is enrolled in the  program due to potential risk for readmission and/or high utilization. Unfortunately, I was not able to speak with the patient in regards to recent hospital discharge     Patient's voicemail has  a generic greeting. To maintain HIPAA compliance, left message including only [facility]BCI CM contact information and a request for a call back .    Plan: Additional outreach attempts will be made to reach the patient enrolled in the [facility] Program (Post Inpatient/ED Visit).  Susa Loffler , BSN, RN [facility]   VBCI-Population Health RN Care Manager Direct Dial [phone]  Fax: [phone] Website: [email or web address]

## 2023-09-10 DIAGNOSIS — D509 Iron deficiency anemia, unspecified: Secondary | ICD-10-CM | POA: Diagnosis not present

## 2023-09-10 DIAGNOSIS — Z853 Personal history of malignant neoplasm of breast: Secondary | ICD-10-CM | POA: Diagnosis not present

## 2023-09-10 DIAGNOSIS — I129 Hypertensive chronic kidney disease with stage 1 through stage 4 chronic kidney disease, or unspecified chronic kidney disease: Secondary | ICD-10-CM | POA: Diagnosis not present

## 2023-09-10 DIAGNOSIS — K5900 Constipation, unspecified: Secondary | ICD-10-CM | POA: Diagnosis not present

## 2023-09-10 DIAGNOSIS — G4700 Insomnia, unspecified: Secondary | ICD-10-CM | POA: Diagnosis not present

## 2023-09-10 DIAGNOSIS — Z556 Problems related to health literacy: Secondary | ICD-10-CM | POA: Diagnosis not present

## 2023-09-10 DIAGNOSIS — M47817 Spondylosis without myelopathy or radiculopathy, lumbosacral region: Secondary | ICD-10-CM | POA: Diagnosis not present

## 2023-09-10 DIAGNOSIS — U071 COVID-19: Secondary | ICD-10-CM | POA: Diagnosis not present

## 2023-09-10 DIAGNOSIS — R1013 Epigastric pain: Secondary | ICD-10-CM | POA: Diagnosis not present

## 2023-09-10 DIAGNOSIS — E041 Nontoxic single thyroid nodule: Secondary | ICD-10-CM | POA: Diagnosis not present

## 2023-09-10 DIAGNOSIS — Z794 Long term (current) use of insulin: Secondary | ICD-10-CM | POA: Diagnosis not present

## 2023-09-10 DIAGNOSIS — E782 Mixed hyperlipidemia: Secondary | ICD-10-CM | POA: Diagnosis not present

## 2023-09-10 DIAGNOSIS — N1832 Chronic kidney disease, stage 3b: Secondary | ICD-10-CM | POA: Diagnosis not present

## 2023-09-10 DIAGNOSIS — K219 Gastro-esophageal reflux disease without esophagitis: Secondary | ICD-10-CM | POA: Diagnosis not present

## 2023-09-10 DIAGNOSIS — J1282 Pneumonia due to coronavirus disease 2019: Secondary | ICD-10-CM | POA: Diagnosis not present

## 2023-09-10 DIAGNOSIS — E876 Hypokalemia: Secondary | ICD-10-CM | POA: Diagnosis not present

## 2023-09-10 DIAGNOSIS — M48061 Spinal stenosis, lumbar region without neurogenic claudication: Secondary | ICD-10-CM | POA: Diagnosis not present

## 2023-09-10 DIAGNOSIS — L989 Disorder of the skin and subcutaneous tissue, unspecified: Secondary | ICD-10-CM | POA: Diagnosis not present

## 2023-09-10 DIAGNOSIS — R1310 Dysphagia, unspecified: Secondary | ICD-10-CM | POA: Diagnosis not present

## 2023-09-10 DIAGNOSIS — R0609 Other forms of dyspnea: Secondary | ICD-10-CM | POA: Diagnosis not present

## 2023-09-10 DIAGNOSIS — E1122 Type 2 diabetes mellitus with diabetic chronic kidney disease: Secondary | ICD-10-CM | POA: Diagnosis not present

## 2023-09-11 ENCOUNTER — Telehealth: Payer: Self-pay

## 2023-09-11 ENCOUNTER — Other Ambulatory Visit: Payer: Self-pay | Admitting: *Deleted

## 2023-09-11 DIAGNOSIS — E876 Hypokalemia: Secondary | ICD-10-CM | POA: Diagnosis not present

## 2023-09-11 DIAGNOSIS — Z853 Personal history of malignant neoplasm of breast: Secondary | ICD-10-CM | POA: Diagnosis not present

## 2023-09-11 DIAGNOSIS — E041 Nontoxic single thyroid nodule: Secondary | ICD-10-CM | POA: Diagnosis not present

## 2023-09-11 DIAGNOSIS — I129 Hypertensive chronic kidney disease with stage 1 through stage 4 chronic kidney disease, or unspecified chronic kidney disease: Secondary | ICD-10-CM | POA: Diagnosis not present

## 2023-09-11 DIAGNOSIS — Z794 Long term (current) use of insulin: Secondary | ICD-10-CM | POA: Diagnosis not present

## 2023-09-11 DIAGNOSIS — K219 Gastro-esophageal reflux disease without esophagitis: Secondary | ICD-10-CM | POA: Diagnosis not present

## 2023-09-11 DIAGNOSIS — M48061 Spinal stenosis, lumbar region without neurogenic claudication: Secondary | ICD-10-CM | POA: Diagnosis not present

## 2023-09-11 DIAGNOSIS — M47817 Spondylosis without myelopathy or radiculopathy, lumbosacral region: Secondary | ICD-10-CM | POA: Diagnosis not present

## 2023-09-11 DIAGNOSIS — R1310 Dysphagia, unspecified: Secondary | ICD-10-CM | POA: Diagnosis not present

## 2023-09-11 DIAGNOSIS — N1832 Chronic kidney disease, stage 3b: Secondary | ICD-10-CM | POA: Diagnosis not present

## 2023-09-11 DIAGNOSIS — E1122 Type 2 diabetes mellitus with diabetic chronic kidney disease: Secondary | ICD-10-CM | POA: Diagnosis not present

## 2023-09-11 DIAGNOSIS — D509 Iron deficiency anemia, unspecified: Secondary | ICD-10-CM | POA: Diagnosis not present

## 2023-09-11 DIAGNOSIS — E782 Mixed hyperlipidemia: Secondary | ICD-10-CM | POA: Diagnosis not present

## 2023-09-11 DIAGNOSIS — Z556 Problems related to health literacy: Secondary | ICD-10-CM | POA: Diagnosis not present

## 2023-09-11 DIAGNOSIS — U071 COVID-19: Secondary | ICD-10-CM | POA: Diagnosis not present

## 2023-09-11 DIAGNOSIS — K5900 Constipation, unspecified: Secondary | ICD-10-CM | POA: Diagnosis not present

## 2023-09-11 DIAGNOSIS — J1282 Pneumonia due to coronavirus disease 2019: Secondary | ICD-10-CM | POA: Diagnosis not present

## 2023-09-11 DIAGNOSIS — R1013 Epigastric pain: Secondary | ICD-10-CM | POA: Diagnosis not present

## 2023-09-11 DIAGNOSIS — G4700 Insomnia, unspecified: Secondary | ICD-10-CM | POA: Diagnosis not present

## 2023-09-11 NOTE — Patient Outreach (Signed)
 Care Management  Transitions of Care Program Transitions of Care Post-discharge week 2  09/11/2023 Name: Stephanie Moreno MRN: [id number] DOB: 1945/07/05  Subjective: Stephanie Moreno is a 78 y.o. year old female who is a primary care patient of Margo Aye, Kathleene Hazel, MD. The Care Management team was unable to reach the patient by phone to assess and address transitions of care needs.   Plan: Additional outreach attempts will be made to reach the patient enrolled in the [facility] Program (Post Inpatient/ED Visit).  Patient  Outreach attempt is in the course of  VBCI  30-day TOC program. Pt previously agreed and is enrolled in the  program due to potential risk for readmission and/or high utilization. Unfortunately, I was not able to speak with the patient in regards to recent hospital discharge     Patient's voicemail has  a generic greeting stating the number is not in service   Susa Loffler , BSN, RN [facility]   [facility]BCI-Population Health RN Care Manager Direct Dial [phone]  Fax: [phone] Website: [email or web address]

## 2023-09-12 ENCOUNTER — Other Ambulatory Visit: Payer: Self-pay | Admitting: *Deleted

## 2023-09-12 ENCOUNTER — Telehealth: Payer: Self-pay

## 2023-09-12 DIAGNOSIS — N1832 Chronic kidney disease, stage 3b: Secondary | ICD-10-CM

## 2023-09-12 DIAGNOSIS — Z7984 Long term (current) use of oral hypoglycemic drugs: Secondary | ICD-10-CM

## 2023-09-12 DIAGNOSIS — Z7985 Long-term (current) use of injectable non-insulin antidiabetic drugs: Secondary | ICD-10-CM

## 2023-09-12 DIAGNOSIS — Z794 Long term (current) use of insulin: Secondary | ICD-10-CM

## 2023-09-12 MED ORDER — BD PEN NEEDLE MICRO U/F 32G X 6 MM MISC
1.0000 | Freq: Every day | 1 refills | Status: DC
Start: 2023-09-12 — End: 2024-04-16

## 2023-09-12 NOTE — Patient Outreach (Signed)
 Care Management  Transitions of Care Program Transitions of Care Post-discharge week 2 Program end   09/12/2023 Name: Stephanie Moreno MRN: [id number] DOB: 03/26/1946  Subjective: Stephanie Moreno is a 78 y.o. year old female who is a primary care patient of Margo Aye, Kathleene Hazel, MD. The Care Management team was unable to reach the patient by phone to assess and address transitions of care needs.   The patient has previously been  provided with contact information for the [facility] care management team and has been advised to call with any health related questions or concerns.   Patient was encouraged to Contact PCP with any changes in baseline or  medication regimen,  changes in health status  /  well-being, safety concerns, including falls any questions or concerns regarding ongoing medical care, any difficulty obtaining or picking up prescriptions, any changes or worsening in condition- including  symptoms not relieved  with interventions      Three attempts have been made to reach the patient regarding the most recent Inpatient/ED visit. Attempt 1    3/7   /2025 Attempt 2   3/11  /2025 Attempt 33/12  /2025  Based on the VBCI program guidelines, if  we are unable to reach the patient  after 3 attempts, no additional outreach attempts will be made and the TOC follow-up will be closed Unfortunately, we have been unable to make contact with the patient for follow up.   The [facility] Case Management Team is available to follow up with the patient after provider conversation with the patient regarding recommendation for care management engagement and subsequent re-referral to the case management team.The VBCI CM team can be reached by calling [phone]. Marland Kitchen    Plan: No further outreach attempts will be made at this time.  We have been unable to reach the patient.  Susa Loffler , BSN, RN [facility]   VBCI-Population Health RN Care Manager Direct Dial [phone]  Fax:  [phone] Website: [email or web address]

## 2023-09-14 DIAGNOSIS — R1013 Epigastric pain: Secondary | ICD-10-CM | POA: Diagnosis not present

## 2023-09-14 DIAGNOSIS — E1122 Type 2 diabetes mellitus with diabetic chronic kidney disease: Secondary | ICD-10-CM | POA: Diagnosis not present

## 2023-09-14 DIAGNOSIS — Z794 Long term (current) use of insulin: Secondary | ICD-10-CM | POA: Diagnosis not present

## 2023-09-14 DIAGNOSIS — E782 Mixed hyperlipidemia: Secondary | ICD-10-CM | POA: Diagnosis not present

## 2023-09-14 DIAGNOSIS — Z556 Problems related to health literacy: Secondary | ICD-10-CM | POA: Diagnosis not present

## 2023-09-14 DIAGNOSIS — E876 Hypokalemia: Secondary | ICD-10-CM | POA: Diagnosis not present

## 2023-09-14 DIAGNOSIS — G4700 Insomnia, unspecified: Secondary | ICD-10-CM | POA: Diagnosis not present

## 2023-09-14 DIAGNOSIS — K5900 Constipation, unspecified: Secondary | ICD-10-CM | POA: Diagnosis not present

## 2023-09-14 DIAGNOSIS — D509 Iron deficiency anemia, unspecified: Secondary | ICD-10-CM | POA: Diagnosis not present

## 2023-09-14 DIAGNOSIS — R1310 Dysphagia, unspecified: Secondary | ICD-10-CM | POA: Diagnosis not present

## 2023-09-14 DIAGNOSIS — M47817 Spondylosis without myelopathy or radiculopathy, lumbosacral region: Secondary | ICD-10-CM | POA: Diagnosis not present

## 2023-09-14 DIAGNOSIS — Z853 Personal history of malignant neoplasm of breast: Secondary | ICD-10-CM | POA: Diagnosis not present

## 2023-09-14 DIAGNOSIS — J1282 Pneumonia due to coronavirus disease 2019: Secondary | ICD-10-CM | POA: Diagnosis not present

## 2023-09-14 DIAGNOSIS — E041 Nontoxic single thyroid nodule: Secondary | ICD-10-CM | POA: Diagnosis not present

## 2023-09-14 DIAGNOSIS — N1832 Chronic kidney disease, stage 3b: Secondary | ICD-10-CM | POA: Diagnosis not present

## 2023-09-14 DIAGNOSIS — U071 COVID-19: Secondary | ICD-10-CM | POA: Diagnosis not present

## 2023-09-14 DIAGNOSIS — M48061 Spinal stenosis, lumbar region without neurogenic claudication: Secondary | ICD-10-CM | POA: Diagnosis not present

## 2023-09-14 DIAGNOSIS — K219 Gastro-esophageal reflux disease without esophagitis: Secondary | ICD-10-CM | POA: Diagnosis not present

## 2023-09-14 DIAGNOSIS — I129 Hypertensive chronic kidney disease with stage 1 through stage 4 chronic kidney disease, or unspecified chronic kidney disease: Secondary | ICD-10-CM | POA: Diagnosis not present

## 2023-09-17 DIAGNOSIS — Z794 Long term (current) use of insulin: Secondary | ICD-10-CM | POA: Diagnosis not present

## 2023-09-17 DIAGNOSIS — M48061 Spinal stenosis, lumbar region without neurogenic claudication: Secondary | ICD-10-CM | POA: Diagnosis not present

## 2023-09-17 DIAGNOSIS — K5900 Constipation, unspecified: Secondary | ICD-10-CM | POA: Diagnosis not present

## 2023-09-17 DIAGNOSIS — E1122 Type 2 diabetes mellitus with diabetic chronic kidney disease: Secondary | ICD-10-CM | POA: Diagnosis not present

## 2023-09-17 DIAGNOSIS — U071 COVID-19: Secondary | ICD-10-CM | POA: Diagnosis not present

## 2023-09-17 DIAGNOSIS — G4700 Insomnia, unspecified: Secondary | ICD-10-CM | POA: Diagnosis not present

## 2023-09-17 DIAGNOSIS — K219 Gastro-esophageal reflux disease without esophagitis: Secondary | ICD-10-CM | POA: Diagnosis not present

## 2023-09-17 DIAGNOSIS — I129 Hypertensive chronic kidney disease with stage 1 through stage 4 chronic kidney disease, or unspecified chronic kidney disease: Secondary | ICD-10-CM | POA: Diagnosis not present

## 2023-09-17 DIAGNOSIS — E876 Hypokalemia: Secondary | ICD-10-CM | POA: Diagnosis not present

## 2023-09-17 DIAGNOSIS — R1310 Dysphagia, unspecified: Secondary | ICD-10-CM | POA: Diagnosis not present

## 2023-09-17 DIAGNOSIS — M47817 Spondylosis without myelopathy or radiculopathy, lumbosacral region: Secondary | ICD-10-CM | POA: Diagnosis not present

## 2023-09-17 DIAGNOSIS — J1282 Pneumonia due to coronavirus disease 2019: Secondary | ICD-10-CM | POA: Diagnosis not present

## 2023-09-17 DIAGNOSIS — E041 Nontoxic single thyroid nodule: Secondary | ICD-10-CM | POA: Diagnosis not present

## 2023-09-17 DIAGNOSIS — E782 Mixed hyperlipidemia: Secondary | ICD-10-CM | POA: Diagnosis not present

## 2023-09-17 DIAGNOSIS — N1832 Chronic kidney disease, stage 3b: Secondary | ICD-10-CM | POA: Diagnosis not present

## 2023-09-17 DIAGNOSIS — Z556 Problems related to health literacy: Secondary | ICD-10-CM | POA: Diagnosis not present

## 2023-09-17 DIAGNOSIS — R1013 Epigastric pain: Secondary | ICD-10-CM | POA: Diagnosis not present

## 2023-09-17 DIAGNOSIS — D509 Iron deficiency anemia, unspecified: Secondary | ICD-10-CM | POA: Diagnosis not present

## 2023-09-17 DIAGNOSIS — Z853 Personal history of malignant neoplasm of breast: Secondary | ICD-10-CM | POA: Diagnosis not present

## 2023-09-19 ENCOUNTER — Telehealth: Payer: Self-pay | Admitting: *Deleted

## 2023-09-19 NOTE — Telephone Encounter (Signed)
 Patient had left a message sharing that her sensor and the reader are not working together. She has a Therapist, art. I have attempted twice to call patient back. I get a message that the call can not go through at this time. Reviewing the patient's chart. She has both the 14 day reader and a Libre 3 reader. Recently the patient was called in [facility] [place] 3 + sensors. There is a question if the patient my be using the older reader with the new sensors? I have reached out to her daughter , Aurther Loft to share this with her. We will attempt to reach back out to the patient.

## 2023-09-20 DIAGNOSIS — E782 Mixed hyperlipidemia: Secondary | ICD-10-CM | POA: Diagnosis not present

## 2023-09-20 DIAGNOSIS — Z853 Personal history of malignant neoplasm of breast: Secondary | ICD-10-CM | POA: Diagnosis not present

## 2023-09-20 DIAGNOSIS — D509 Iron deficiency anemia, unspecified: Secondary | ICD-10-CM | POA: Diagnosis not present

## 2023-09-20 DIAGNOSIS — K5900 Constipation, unspecified: Secondary | ICD-10-CM | POA: Diagnosis not present

## 2023-09-20 DIAGNOSIS — N1832 Chronic kidney disease, stage 3b: Secondary | ICD-10-CM | POA: Diagnosis not present

## 2023-09-20 DIAGNOSIS — R1013 Epigastric pain: Secondary | ICD-10-CM | POA: Diagnosis not present

## 2023-09-20 DIAGNOSIS — M47817 Spondylosis without myelopathy or radiculopathy, lumbosacral region: Secondary | ICD-10-CM | POA: Diagnosis not present

## 2023-09-20 DIAGNOSIS — G4700 Insomnia, unspecified: Secondary | ICD-10-CM | POA: Diagnosis not present

## 2023-09-20 DIAGNOSIS — E876 Hypokalemia: Secondary | ICD-10-CM | POA: Diagnosis not present

## 2023-09-20 DIAGNOSIS — U071 COVID-19: Secondary | ICD-10-CM | POA: Diagnosis not present

## 2023-09-20 DIAGNOSIS — Z794 Long term (current) use of insulin: Secondary | ICD-10-CM | POA: Diagnosis not present

## 2023-09-20 DIAGNOSIS — K219 Gastro-esophageal reflux disease without esophagitis: Secondary | ICD-10-CM | POA: Diagnosis not present

## 2023-09-20 DIAGNOSIS — I129 Hypertensive chronic kidney disease with stage 1 through stage 4 chronic kidney disease, or unspecified chronic kidney disease: Secondary | ICD-10-CM | POA: Diagnosis not present

## 2023-09-20 DIAGNOSIS — E041 Nontoxic single thyroid nodule: Secondary | ICD-10-CM | POA: Diagnosis not present

## 2023-09-20 DIAGNOSIS — R1310 Dysphagia, unspecified: Secondary | ICD-10-CM | POA: Diagnosis not present

## 2023-09-20 DIAGNOSIS — J1282 Pneumonia due to coronavirus disease 2019: Secondary | ICD-10-CM | POA: Diagnosis not present

## 2023-09-20 DIAGNOSIS — E1122 Type 2 diabetes mellitus with diabetic chronic kidney disease: Secondary | ICD-10-CM | POA: Diagnosis not present

## 2023-09-20 DIAGNOSIS — M48061 Spinal stenosis, lumbar region without neurogenic claudication: Secondary | ICD-10-CM | POA: Diagnosis not present

## 2023-09-20 DIAGNOSIS — Z556 Problems related to health literacy: Secondary | ICD-10-CM | POA: Diagnosis not present

## 2023-09-21 DIAGNOSIS — K219 Gastro-esophageal reflux disease without esophagitis: Secondary | ICD-10-CM | POA: Diagnosis not present

## 2023-09-21 DIAGNOSIS — J1282 Pneumonia due to coronavirus disease 2019: Secondary | ICD-10-CM | POA: Diagnosis not present

## 2023-09-21 DIAGNOSIS — K5900 Constipation, unspecified: Secondary | ICD-10-CM | POA: Diagnosis not present

## 2023-09-21 DIAGNOSIS — G4700 Insomnia, unspecified: Secondary | ICD-10-CM | POA: Diagnosis not present

## 2023-09-21 DIAGNOSIS — R1310 Dysphagia, unspecified: Secondary | ICD-10-CM | POA: Diagnosis not present

## 2023-09-21 DIAGNOSIS — M47817 Spondylosis without myelopathy or radiculopathy, lumbosacral region: Secondary | ICD-10-CM | POA: Diagnosis not present

## 2023-09-21 DIAGNOSIS — E041 Nontoxic single thyroid nodule: Secondary | ICD-10-CM | POA: Diagnosis not present

## 2023-09-21 DIAGNOSIS — I129 Hypertensive chronic kidney disease with stage 1 through stage 4 chronic kidney disease, or unspecified chronic kidney disease: Secondary | ICD-10-CM | POA: Diagnosis not present

## 2023-09-21 DIAGNOSIS — D509 Iron deficiency anemia, unspecified: Secondary | ICD-10-CM | POA: Diagnosis not present

## 2023-09-21 DIAGNOSIS — U071 COVID-19: Secondary | ICD-10-CM | POA: Diagnosis not present

## 2023-09-21 DIAGNOSIS — Z794 Long term (current) use of insulin: Secondary | ICD-10-CM | POA: Diagnosis not present

## 2023-09-21 DIAGNOSIS — N1832 Chronic kidney disease, stage 3b: Secondary | ICD-10-CM | POA: Diagnosis not present

## 2023-09-21 DIAGNOSIS — Z556 Problems related to health literacy: Secondary | ICD-10-CM | POA: Diagnosis not present

## 2023-09-21 DIAGNOSIS — Z853 Personal history of malignant neoplasm of breast: Secondary | ICD-10-CM | POA: Diagnosis not present

## 2023-09-21 DIAGNOSIS — E1122 Type 2 diabetes mellitus with diabetic chronic kidney disease: Secondary | ICD-10-CM | POA: Diagnosis not present

## 2023-09-21 DIAGNOSIS — E782 Mixed hyperlipidemia: Secondary | ICD-10-CM | POA: Diagnosis not present

## 2023-09-21 DIAGNOSIS — R1013 Epigastric pain: Secondary | ICD-10-CM | POA: Diagnosis not present

## 2023-09-21 DIAGNOSIS — M48061 Spinal stenosis, lumbar region without neurogenic claudication: Secondary | ICD-10-CM | POA: Diagnosis not present

## 2023-09-21 DIAGNOSIS — E876 Hypokalemia: Secondary | ICD-10-CM | POA: Diagnosis not present

## 2023-09-23 DIAGNOSIS — M2550 Pain in unspecified joint: Secondary | ICD-10-CM | POA: Diagnosis not present

## 2023-09-23 DIAGNOSIS — M25569 Pain in unspecified knee: Secondary | ICD-10-CM | POA: Diagnosis not present

## 2023-09-23 DIAGNOSIS — R030 Elevated blood-pressure reading, without diagnosis of hypertension: Secondary | ICD-10-CM | POA: Diagnosis not present

## 2023-09-24 DIAGNOSIS — Z794 Long term (current) use of insulin: Secondary | ICD-10-CM | POA: Diagnosis not present

## 2023-09-24 DIAGNOSIS — E041 Nontoxic single thyroid nodule: Secondary | ICD-10-CM | POA: Diagnosis not present

## 2023-09-24 DIAGNOSIS — E876 Hypokalemia: Secondary | ICD-10-CM | POA: Diagnosis not present

## 2023-09-24 DIAGNOSIS — K219 Gastro-esophageal reflux disease without esophagitis: Secondary | ICD-10-CM | POA: Diagnosis not present

## 2023-09-24 DIAGNOSIS — N1832 Chronic kidney disease, stage 3b: Secondary | ICD-10-CM | POA: Diagnosis not present

## 2023-09-24 DIAGNOSIS — G4700 Insomnia, unspecified: Secondary | ICD-10-CM | POA: Diagnosis not present

## 2023-09-24 DIAGNOSIS — R1013 Epigastric pain: Secondary | ICD-10-CM | POA: Diagnosis not present

## 2023-09-24 DIAGNOSIS — Z853 Personal history of malignant neoplasm of breast: Secondary | ICD-10-CM | POA: Diagnosis not present

## 2023-09-24 DIAGNOSIS — E1122 Type 2 diabetes mellitus with diabetic chronic kidney disease: Secondary | ICD-10-CM | POA: Diagnosis not present

## 2023-09-24 DIAGNOSIS — E782 Mixed hyperlipidemia: Secondary | ICD-10-CM | POA: Diagnosis not present

## 2023-09-24 DIAGNOSIS — I129 Hypertensive chronic kidney disease with stage 1 through stage 4 chronic kidney disease, or unspecified chronic kidney disease: Secondary | ICD-10-CM | POA: Diagnosis not present

## 2023-09-24 DIAGNOSIS — K5900 Constipation, unspecified: Secondary | ICD-10-CM | POA: Diagnosis not present

## 2023-09-24 DIAGNOSIS — M48061 Spinal stenosis, lumbar region without neurogenic claudication: Secondary | ICD-10-CM | POA: Diagnosis not present

## 2023-09-24 DIAGNOSIS — M47817 Spondylosis without myelopathy or radiculopathy, lumbosacral region: Secondary | ICD-10-CM | POA: Diagnosis not present

## 2023-09-24 DIAGNOSIS — D509 Iron deficiency anemia, unspecified: Secondary | ICD-10-CM | POA: Diagnosis not present

## 2023-09-24 DIAGNOSIS — J1282 Pneumonia due to coronavirus disease 2019: Secondary | ICD-10-CM | POA: Diagnosis not present

## 2023-09-24 DIAGNOSIS — R1310 Dysphagia, unspecified: Secondary | ICD-10-CM | POA: Diagnosis not present

## 2023-09-24 DIAGNOSIS — U071 COVID-19: Secondary | ICD-10-CM | POA: Diagnosis not present

## 2023-09-24 DIAGNOSIS — Z556 Problems related to health literacy: Secondary | ICD-10-CM | POA: Diagnosis not present

## 2023-09-25 ENCOUNTER — Ambulatory Visit (HOSPITAL_COMMUNITY)
Admission: RE | Admit: 2023-09-25 | Discharge: 2023-09-25 | Disposition: A | Discharge status: Home / Self Care | Source: Ambulatory Visit | Attending: Internal Medicine | Admitting: Internal Medicine

## 2023-09-25 DIAGNOSIS — R928 Other abnormal and inconclusive findings on diagnostic imaging of breast: Secondary | ICD-10-CM | POA: Diagnosis not present

## 2023-09-25 DIAGNOSIS — E782 Mixed hyperlipidemia: Secondary | ICD-10-CM | POA: Diagnosis not present

## 2023-09-25 DIAGNOSIS — K219 Gastro-esophageal reflux disease without esophagitis: Secondary | ICD-10-CM | POA: Diagnosis not present

## 2023-09-25 DIAGNOSIS — I129 Hypertensive chronic kidney disease with stage 1 through stage 4 chronic kidney disease, or unspecified chronic kidney disease: Secondary | ICD-10-CM | POA: Diagnosis not present

## 2023-09-25 DIAGNOSIS — E876 Hypokalemia: Secondary | ICD-10-CM | POA: Diagnosis not present

## 2023-09-25 DIAGNOSIS — D509 Iron deficiency anemia, unspecified: Secondary | ICD-10-CM | POA: Diagnosis not present

## 2023-09-25 DIAGNOSIS — J1282 Pneumonia due to coronavirus disease 2019: Secondary | ICD-10-CM | POA: Diagnosis not present

## 2023-09-25 DIAGNOSIS — Z556 Problems related to health literacy: Secondary | ICD-10-CM | POA: Diagnosis not present

## 2023-09-25 DIAGNOSIS — G4700 Insomnia, unspecified: Secondary | ICD-10-CM | POA: Diagnosis not present

## 2023-09-25 DIAGNOSIS — K5900 Constipation, unspecified: Secondary | ICD-10-CM | POA: Diagnosis not present

## 2023-09-25 DIAGNOSIS — Z853 Personal history of malignant neoplasm of breast: Secondary | ICD-10-CM | POA: Diagnosis not present

## 2023-09-25 DIAGNOSIS — R1013 Epigastric pain: Secondary | ICD-10-CM | POA: Diagnosis not present

## 2023-09-25 DIAGNOSIS — E1122 Type 2 diabetes mellitus with diabetic chronic kidney disease: Secondary | ICD-10-CM | POA: Diagnosis not present

## 2023-09-25 DIAGNOSIS — M48061 Spinal stenosis, lumbar region without neurogenic claudication: Secondary | ICD-10-CM | POA: Diagnosis not present

## 2023-09-25 DIAGNOSIS — Z794 Long term (current) use of insulin: Secondary | ICD-10-CM | POA: Diagnosis not present

## 2023-09-25 DIAGNOSIS — E041 Nontoxic single thyroid nodule: Secondary | ICD-10-CM | POA: Diagnosis not present

## 2023-09-25 DIAGNOSIS — M47817 Spondylosis without myelopathy or radiculopathy, lumbosacral region: Secondary | ICD-10-CM | POA: Diagnosis not present

## 2023-09-25 DIAGNOSIS — R1310 Dysphagia, unspecified: Secondary | ICD-10-CM | POA: Diagnosis not present

## 2023-09-25 DIAGNOSIS — U071 COVID-19: Secondary | ICD-10-CM | POA: Diagnosis not present

## 2023-09-25 DIAGNOSIS — N1832 Chronic kidney disease, stage 3b: Secondary | ICD-10-CM | POA: Diagnosis not present

## 2023-09-25 DIAGNOSIS — N6320 Unspecified lump in the left breast, unspecified quadrant: Secondary | ICD-10-CM | POA: Diagnosis not present

## 2023-09-26 ENCOUNTER — Other Ambulatory Visit: Payer: Self-pay | Admitting: *Deleted

## 2023-09-26 DIAGNOSIS — N1832 Chronic kidney disease, stage 3b: Secondary | ICD-10-CM

## 2023-09-26 DIAGNOSIS — Z7984 Long term (current) use of oral hypoglycemic drugs: Secondary | ICD-10-CM

## 2023-09-26 DIAGNOSIS — Z794 Long term (current) use of insulin: Secondary | ICD-10-CM

## 2023-09-26 MED ORDER — FREESTYLE LIBRE 3 PLUS SENSOR MISC: 1 refills | Status: DC

## 2023-09-27 DIAGNOSIS — E876 Hypokalemia: Secondary | ICD-10-CM | POA: Diagnosis not present

## 2023-09-27 DIAGNOSIS — Z794 Long term (current) use of insulin: Secondary | ICD-10-CM | POA: Diagnosis not present

## 2023-09-27 DIAGNOSIS — E041 Nontoxic single thyroid nodule: Secondary | ICD-10-CM | POA: Diagnosis not present

## 2023-09-27 DIAGNOSIS — I129 Hypertensive chronic kidney disease with stage 1 through stage 4 chronic kidney disease, or unspecified chronic kidney disease: Secondary | ICD-10-CM | POA: Diagnosis not present

## 2023-09-27 DIAGNOSIS — N1832 Chronic kidney disease, stage 3b: Secondary | ICD-10-CM | POA: Diagnosis not present

## 2023-09-27 DIAGNOSIS — K219 Gastro-esophageal reflux disease without esophagitis: Secondary | ICD-10-CM | POA: Diagnosis not present

## 2023-09-27 DIAGNOSIS — U071 COVID-19: Secondary | ICD-10-CM | POA: Diagnosis not present

## 2023-09-27 DIAGNOSIS — E1122 Type 2 diabetes mellitus with diabetic chronic kidney disease: Secondary | ICD-10-CM | POA: Diagnosis not present

## 2023-09-27 DIAGNOSIS — R1310 Dysphagia, unspecified: Secondary | ICD-10-CM | POA: Diagnosis not present

## 2023-09-27 DIAGNOSIS — Z853 Personal history of malignant neoplasm of breast: Secondary | ICD-10-CM | POA: Diagnosis not present

## 2023-09-27 DIAGNOSIS — E782 Mixed hyperlipidemia: Secondary | ICD-10-CM | POA: Diagnosis not present

## 2023-09-27 DIAGNOSIS — K5900 Constipation, unspecified: Secondary | ICD-10-CM | POA: Diagnosis not present

## 2023-09-27 DIAGNOSIS — R1013 Epigastric pain: Secondary | ICD-10-CM | POA: Diagnosis not present

## 2023-09-27 DIAGNOSIS — M47817 Spondylosis without myelopathy or radiculopathy, lumbosacral region: Secondary | ICD-10-CM | POA: Diagnosis not present

## 2023-09-27 DIAGNOSIS — D509 Iron deficiency anemia, unspecified: Secondary | ICD-10-CM | POA: Diagnosis not present

## 2023-09-27 DIAGNOSIS — Z556 Problems related to health literacy: Secondary | ICD-10-CM | POA: Diagnosis not present

## 2023-09-27 DIAGNOSIS — G4700 Insomnia, unspecified: Secondary | ICD-10-CM | POA: Diagnosis not present

## 2023-09-27 DIAGNOSIS — J1282 Pneumonia due to coronavirus disease 2019: Secondary | ICD-10-CM | POA: Diagnosis not present

## 2023-09-27 DIAGNOSIS — M48061 Spinal stenosis, lumbar region without neurogenic claudication: Secondary | ICD-10-CM | POA: Diagnosis not present

## 2023-09-28 DIAGNOSIS — Z853 Personal history of malignant neoplasm of breast: Secondary | ICD-10-CM | POA: Diagnosis not present

## 2023-09-28 DIAGNOSIS — M79602 Pain in left arm: Secondary | ICD-10-CM | POA: Diagnosis not present

## 2023-10-01 DIAGNOSIS — E782 Mixed hyperlipidemia: Secondary | ICD-10-CM | POA: Diagnosis not present

## 2023-10-01 DIAGNOSIS — R1013 Epigastric pain: Secondary | ICD-10-CM | POA: Diagnosis not present

## 2023-10-01 DIAGNOSIS — G4700 Insomnia, unspecified: Secondary | ICD-10-CM | POA: Diagnosis not present

## 2023-10-01 DIAGNOSIS — J1282 Pneumonia due to coronavirus disease 2019: Secondary | ICD-10-CM | POA: Diagnosis not present

## 2023-10-01 DIAGNOSIS — Z794 Long term (current) use of insulin: Secondary | ICD-10-CM | POA: Diagnosis not present

## 2023-10-01 DIAGNOSIS — K5900 Constipation, unspecified: Secondary | ICD-10-CM | POA: Diagnosis not present

## 2023-10-01 DIAGNOSIS — U071 COVID-19: Secondary | ICD-10-CM | POA: Diagnosis not present

## 2023-10-01 DIAGNOSIS — I129 Hypertensive chronic kidney disease with stage 1 through stage 4 chronic kidney disease, or unspecified chronic kidney disease: Secondary | ICD-10-CM | POA: Diagnosis not present

## 2023-10-01 DIAGNOSIS — Z556 Problems related to health literacy: Secondary | ICD-10-CM | POA: Diagnosis not present

## 2023-10-01 DIAGNOSIS — M47817 Spondylosis without myelopathy or radiculopathy, lumbosacral region: Secondary | ICD-10-CM | POA: Diagnosis not present

## 2023-10-01 DIAGNOSIS — N1832 Chronic kidney disease, stage 3b: Secondary | ICD-10-CM | POA: Diagnosis not present

## 2023-10-01 DIAGNOSIS — K219 Gastro-esophageal reflux disease without esophagitis: Secondary | ICD-10-CM | POA: Diagnosis not present

## 2023-10-01 DIAGNOSIS — D509 Iron deficiency anemia, unspecified: Secondary | ICD-10-CM | POA: Diagnosis not present

## 2023-10-01 DIAGNOSIS — Z853 Personal history of malignant neoplasm of breast: Secondary | ICD-10-CM | POA: Diagnosis not present

## 2023-10-01 DIAGNOSIS — E041 Nontoxic single thyroid nodule: Secondary | ICD-10-CM | POA: Diagnosis not present

## 2023-10-01 DIAGNOSIS — E1122 Type 2 diabetes mellitus with diabetic chronic kidney disease: Secondary | ICD-10-CM | POA: Diagnosis not present

## 2023-10-01 DIAGNOSIS — M48061 Spinal stenosis, lumbar region without neurogenic claudication: Secondary | ICD-10-CM | POA: Diagnosis not present

## 2023-10-01 DIAGNOSIS — E876 Hypokalemia: Secondary | ICD-10-CM | POA: Diagnosis not present

## 2023-10-01 DIAGNOSIS — R1310 Dysphagia, unspecified: Secondary | ICD-10-CM | POA: Diagnosis not present

## 2023-10-02 ENCOUNTER — Other Ambulatory Visit: Payer: Self-pay | Admitting: Gastroenterology

## 2023-10-02 DIAGNOSIS — U071 COVID-19: Secondary | ICD-10-CM | POA: Diagnosis not present

## 2023-10-02 DIAGNOSIS — Z853 Personal history of malignant neoplasm of breast: Secondary | ICD-10-CM | POA: Diagnosis not present

## 2023-10-02 DIAGNOSIS — I129 Hypertensive chronic kidney disease with stage 1 through stage 4 chronic kidney disease, or unspecified chronic kidney disease: Secondary | ICD-10-CM | POA: Diagnosis not present

## 2023-10-02 DIAGNOSIS — Z556 Problems related to health literacy: Secondary | ICD-10-CM | POA: Diagnosis not present

## 2023-10-02 DIAGNOSIS — K219 Gastro-esophageal reflux disease without esophagitis: Secondary | ICD-10-CM | POA: Diagnosis not present

## 2023-10-02 DIAGNOSIS — M47817 Spondylosis without myelopathy or radiculopathy, lumbosacral region: Secondary | ICD-10-CM | POA: Diagnosis not present

## 2023-10-02 DIAGNOSIS — E041 Nontoxic single thyroid nodule: Secondary | ICD-10-CM | POA: Diagnosis not present

## 2023-10-02 DIAGNOSIS — R1013 Epigastric pain: Secondary | ICD-10-CM | POA: Diagnosis not present

## 2023-10-02 DIAGNOSIS — R1310 Dysphagia, unspecified: Secondary | ICD-10-CM | POA: Diagnosis not present

## 2023-10-02 DIAGNOSIS — Z794 Long term (current) use of insulin: Secondary | ICD-10-CM | POA: Diagnosis not present

## 2023-10-02 DIAGNOSIS — M48061 Spinal stenosis, lumbar region without neurogenic claudication: Secondary | ICD-10-CM | POA: Diagnosis not present

## 2023-10-02 DIAGNOSIS — E876 Hypokalemia: Secondary | ICD-10-CM | POA: Diagnosis not present

## 2023-10-02 DIAGNOSIS — K5900 Constipation, unspecified: Secondary | ICD-10-CM | POA: Diagnosis not present

## 2023-10-02 DIAGNOSIS — E782 Mixed hyperlipidemia: Secondary | ICD-10-CM | POA: Diagnosis not present

## 2023-10-02 DIAGNOSIS — E1122 Type 2 diabetes mellitus with diabetic chronic kidney disease: Secondary | ICD-10-CM | POA: Diagnosis not present

## 2023-10-02 DIAGNOSIS — G4700 Insomnia, unspecified: Secondary | ICD-10-CM | POA: Diagnosis not present

## 2023-10-02 DIAGNOSIS — J1282 Pneumonia due to coronavirus disease 2019: Secondary | ICD-10-CM | POA: Diagnosis not present

## 2023-10-02 DIAGNOSIS — N1832 Chronic kidney disease, stage 3b: Secondary | ICD-10-CM | POA: Diagnosis not present

## 2023-10-02 DIAGNOSIS — D509 Iron deficiency anemia, unspecified: Secondary | ICD-10-CM | POA: Diagnosis not present

## 2023-10-04 DIAGNOSIS — I129 Hypertensive chronic kidney disease with stage 1 through stage 4 chronic kidney disease, or unspecified chronic kidney disease: Secondary | ICD-10-CM | POA: Diagnosis not present

## 2023-10-04 DIAGNOSIS — G4700 Insomnia, unspecified: Secondary | ICD-10-CM | POA: Diagnosis not present

## 2023-10-04 DIAGNOSIS — R1310 Dysphagia, unspecified: Secondary | ICD-10-CM | POA: Diagnosis not present

## 2023-10-04 DIAGNOSIS — Z556 Problems related to health literacy: Secondary | ICD-10-CM | POA: Diagnosis not present

## 2023-10-04 DIAGNOSIS — Z853 Personal history of malignant neoplasm of breast: Secondary | ICD-10-CM | POA: Diagnosis not present

## 2023-10-04 DIAGNOSIS — E1122 Type 2 diabetes mellitus with diabetic chronic kidney disease: Secondary | ICD-10-CM | POA: Diagnosis not present

## 2023-10-04 DIAGNOSIS — E041 Nontoxic single thyroid nodule: Secondary | ICD-10-CM | POA: Diagnosis not present

## 2023-10-04 DIAGNOSIS — D509 Iron deficiency anemia, unspecified: Secondary | ICD-10-CM | POA: Diagnosis not present

## 2023-10-04 DIAGNOSIS — J1282 Pneumonia due to coronavirus disease 2019: Secondary | ICD-10-CM | POA: Diagnosis not present

## 2023-10-04 DIAGNOSIS — E876 Hypokalemia: Secondary | ICD-10-CM | POA: Diagnosis not present

## 2023-10-04 DIAGNOSIS — N1832 Chronic kidney disease, stage 3b: Secondary | ICD-10-CM | POA: Diagnosis not present

## 2023-10-04 DIAGNOSIS — Z794 Long term (current) use of insulin: Secondary | ICD-10-CM | POA: Diagnosis not present

## 2023-10-04 DIAGNOSIS — K219 Gastro-esophageal reflux disease without esophagitis: Secondary | ICD-10-CM | POA: Diagnosis not present

## 2023-10-04 DIAGNOSIS — M48061 Spinal stenosis, lumbar region without neurogenic claudication: Secondary | ICD-10-CM | POA: Diagnosis not present

## 2023-10-04 DIAGNOSIS — K5900 Constipation, unspecified: Secondary | ICD-10-CM | POA: Diagnosis not present

## 2023-10-04 DIAGNOSIS — U071 COVID-19: Secondary | ICD-10-CM | POA: Diagnosis not present

## 2023-10-04 DIAGNOSIS — R1013 Epigastric pain: Secondary | ICD-10-CM | POA: Diagnosis not present

## 2023-10-04 DIAGNOSIS — M47817 Spondylosis without myelopathy or radiculopathy, lumbosacral region: Secondary | ICD-10-CM | POA: Diagnosis not present

## 2023-10-04 DIAGNOSIS — E782 Mixed hyperlipidemia: Secondary | ICD-10-CM | POA: Diagnosis not present

## 2023-10-08 DIAGNOSIS — D485 Neoplasm of uncertain behavior of skin: Secondary | ICD-10-CM | POA: Diagnosis not present

## 2023-10-08 DIAGNOSIS — E876 Hypokalemia: Secondary | ICD-10-CM | POA: Diagnosis not present

## 2023-10-08 DIAGNOSIS — E782 Mixed hyperlipidemia: Secondary | ICD-10-CM | POA: Diagnosis not present

## 2023-10-08 DIAGNOSIS — N1832 Chronic kidney disease, stage 3b: Secondary | ICD-10-CM | POA: Diagnosis not present

## 2023-10-08 DIAGNOSIS — L815 Leukoderma, not elsewhere classified: Secondary | ICD-10-CM | POA: Diagnosis not present

## 2023-10-08 DIAGNOSIS — Z556 Problems related to health literacy: Secondary | ICD-10-CM | POA: Diagnosis not present

## 2023-10-08 DIAGNOSIS — G4700 Insomnia, unspecified: Secondary | ICD-10-CM | POA: Diagnosis not present

## 2023-10-08 DIAGNOSIS — E1122 Type 2 diabetes mellitus with diabetic chronic kidney disease: Secondary | ICD-10-CM | POA: Diagnosis not present

## 2023-10-08 DIAGNOSIS — D509 Iron deficiency anemia, unspecified: Secondary | ICD-10-CM | POA: Diagnosis not present

## 2023-10-08 DIAGNOSIS — Z853 Personal history of malignant neoplasm of breast: Secondary | ICD-10-CM | POA: Diagnosis not present

## 2023-10-08 DIAGNOSIS — K219 Gastro-esophageal reflux disease without esophagitis: Secondary | ICD-10-CM | POA: Diagnosis not present

## 2023-10-08 DIAGNOSIS — U071 COVID-19: Secondary | ICD-10-CM | POA: Diagnosis not present

## 2023-10-08 DIAGNOSIS — R1013 Epigastric pain: Secondary | ICD-10-CM | POA: Diagnosis not present

## 2023-10-08 DIAGNOSIS — M48061 Spinal stenosis, lumbar region without neurogenic claudication: Secondary | ICD-10-CM | POA: Diagnosis not present

## 2023-10-08 DIAGNOSIS — E041 Nontoxic single thyroid nodule: Secondary | ICD-10-CM | POA: Diagnosis not present

## 2023-10-08 DIAGNOSIS — J1282 Pneumonia due to coronavirus disease 2019: Secondary | ICD-10-CM | POA: Diagnosis not present

## 2023-10-08 DIAGNOSIS — Z794 Long term (current) use of insulin: Secondary | ICD-10-CM | POA: Diagnosis not present

## 2023-10-08 DIAGNOSIS — K5900 Constipation, unspecified: Secondary | ICD-10-CM | POA: Diagnosis not present

## 2023-10-08 DIAGNOSIS — R1310 Dysphagia, unspecified: Secondary | ICD-10-CM | POA: Diagnosis not present

## 2023-10-08 DIAGNOSIS — D2362 Other benign neoplasm of skin of left upper limb, including shoulder: Secondary | ICD-10-CM | POA: Diagnosis not present

## 2023-10-08 DIAGNOSIS — M47817 Spondylosis without myelopathy or radiculopathy, lumbosacral region: Secondary | ICD-10-CM | POA: Diagnosis not present

## 2023-10-08 DIAGNOSIS — I129 Hypertensive chronic kidney disease with stage 1 through stage 4 chronic kidney disease, or unspecified chronic kidney disease: Secondary | ICD-10-CM | POA: Diagnosis not present

## 2023-10-08 DIAGNOSIS — L821 Other seborrheic keratosis: Secondary | ICD-10-CM | POA: Diagnosis not present

## 2023-10-09 DIAGNOSIS — R1310 Dysphagia, unspecified: Secondary | ICD-10-CM | POA: Diagnosis not present

## 2023-10-09 DIAGNOSIS — E1122 Type 2 diabetes mellitus with diabetic chronic kidney disease: Secondary | ICD-10-CM | POA: Diagnosis not present

## 2023-10-09 DIAGNOSIS — E876 Hypokalemia: Secondary | ICD-10-CM | POA: Diagnosis not present

## 2023-10-09 DIAGNOSIS — R1013 Epigastric pain: Secondary | ICD-10-CM | POA: Diagnosis not present

## 2023-10-09 DIAGNOSIS — U071 COVID-19: Secondary | ICD-10-CM | POA: Diagnosis not present

## 2023-10-09 DIAGNOSIS — I129 Hypertensive chronic kidney disease with stage 1 through stage 4 chronic kidney disease, or unspecified chronic kidney disease: Secondary | ICD-10-CM | POA: Diagnosis not present

## 2023-10-09 DIAGNOSIS — M47817 Spondylosis without myelopathy or radiculopathy, lumbosacral region: Secondary | ICD-10-CM | POA: Diagnosis not present

## 2023-10-09 DIAGNOSIS — N1832 Chronic kidney disease, stage 3b: Secondary | ICD-10-CM | POA: Diagnosis not present

## 2023-10-09 DIAGNOSIS — M48061 Spinal stenosis, lumbar region without neurogenic claudication: Secondary | ICD-10-CM | POA: Diagnosis not present

## 2023-10-09 DIAGNOSIS — E041 Nontoxic single thyroid nodule: Secondary | ICD-10-CM | POA: Diagnosis not present

## 2023-10-09 DIAGNOSIS — Z556 Problems related to health literacy: Secondary | ICD-10-CM | POA: Diagnosis not present

## 2023-10-09 DIAGNOSIS — Z853 Personal history of malignant neoplasm of breast: Secondary | ICD-10-CM | POA: Diagnosis not present

## 2023-10-09 DIAGNOSIS — G4700 Insomnia, unspecified: Secondary | ICD-10-CM | POA: Diagnosis not present

## 2023-10-09 DIAGNOSIS — D509 Iron deficiency anemia, unspecified: Secondary | ICD-10-CM | POA: Diagnosis not present

## 2023-10-09 DIAGNOSIS — Z794 Long term (current) use of insulin: Secondary | ICD-10-CM | POA: Diagnosis not present

## 2023-10-09 DIAGNOSIS — J1282 Pneumonia due to coronavirus disease 2019: Secondary | ICD-10-CM | POA: Diagnosis not present

## 2023-10-09 DIAGNOSIS — K219 Gastro-esophageal reflux disease without esophagitis: Secondary | ICD-10-CM | POA: Diagnosis not present

## 2023-10-09 DIAGNOSIS — E782 Mixed hyperlipidemia: Secondary | ICD-10-CM | POA: Diagnosis not present

## 2023-10-09 DIAGNOSIS — K5900 Constipation, unspecified: Secondary | ICD-10-CM | POA: Diagnosis not present

## 2023-10-10 DIAGNOSIS — N189 Chronic kidney disease, unspecified: Secondary | ICD-10-CM | POA: Diagnosis not present

## 2023-10-10 DIAGNOSIS — R809 Proteinuria, unspecified: Secondary | ICD-10-CM | POA: Diagnosis not present

## 2023-10-10 DIAGNOSIS — D631 Anemia in chronic kidney disease: Secondary | ICD-10-CM | POA: Diagnosis not present

## 2023-10-11 DIAGNOSIS — Z556 Problems related to health literacy: Secondary | ICD-10-CM | POA: Diagnosis not present

## 2023-10-11 DIAGNOSIS — Z794 Long term (current) use of insulin: Secondary | ICD-10-CM | POA: Diagnosis not present

## 2023-10-11 DIAGNOSIS — R1013 Epigastric pain: Secondary | ICD-10-CM | POA: Diagnosis not present

## 2023-10-11 DIAGNOSIS — J1282 Pneumonia due to coronavirus disease 2019: Secondary | ICD-10-CM | POA: Diagnosis not present

## 2023-10-11 DIAGNOSIS — E876 Hypokalemia: Secondary | ICD-10-CM | POA: Diagnosis not present

## 2023-10-11 DIAGNOSIS — K5900 Constipation, unspecified: Secondary | ICD-10-CM | POA: Diagnosis not present

## 2023-10-11 DIAGNOSIS — R1310 Dysphagia, unspecified: Secondary | ICD-10-CM | POA: Diagnosis not present

## 2023-10-11 DIAGNOSIS — M47817 Spondylosis without myelopathy or radiculopathy, lumbosacral region: Secondary | ICD-10-CM | POA: Diagnosis not present

## 2023-10-11 DIAGNOSIS — E1122 Type 2 diabetes mellitus with diabetic chronic kidney disease: Secondary | ICD-10-CM | POA: Diagnosis not present

## 2023-10-11 DIAGNOSIS — U071 COVID-19: Secondary | ICD-10-CM | POA: Diagnosis not present

## 2023-10-11 DIAGNOSIS — E782 Mixed hyperlipidemia: Secondary | ICD-10-CM | POA: Diagnosis not present

## 2023-10-11 DIAGNOSIS — K219 Gastro-esophageal reflux disease without esophagitis: Secondary | ICD-10-CM | POA: Diagnosis not present

## 2023-10-11 DIAGNOSIS — D509 Iron deficiency anemia, unspecified: Secondary | ICD-10-CM | POA: Diagnosis not present

## 2023-10-11 DIAGNOSIS — N1832 Chronic kidney disease, stage 3b: Secondary | ICD-10-CM | POA: Diagnosis not present

## 2023-10-11 DIAGNOSIS — E041 Nontoxic single thyroid nodule: Secondary | ICD-10-CM | POA: Diagnosis not present

## 2023-10-11 DIAGNOSIS — I129 Hypertensive chronic kidney disease with stage 1 through stage 4 chronic kidney disease, or unspecified chronic kidney disease: Secondary | ICD-10-CM | POA: Diagnosis not present

## 2023-10-11 DIAGNOSIS — Z853 Personal history of malignant neoplasm of breast: Secondary | ICD-10-CM | POA: Diagnosis not present

## 2023-10-11 DIAGNOSIS — M48061 Spinal stenosis, lumbar region without neurogenic claudication: Secondary | ICD-10-CM | POA: Diagnosis not present

## 2023-10-11 DIAGNOSIS — G4700 Insomnia, unspecified: Secondary | ICD-10-CM | POA: Diagnosis not present

## 2023-10-12 DIAGNOSIS — K219 Gastro-esophageal reflux disease without esophagitis: Secondary | ICD-10-CM | POA: Diagnosis not present

## 2023-10-12 DIAGNOSIS — M47817 Spondylosis without myelopathy or radiculopathy, lumbosacral region: Secondary | ICD-10-CM | POA: Diagnosis not present

## 2023-10-12 DIAGNOSIS — E041 Nontoxic single thyroid nodule: Secondary | ICD-10-CM | POA: Diagnosis not present

## 2023-10-12 DIAGNOSIS — U071 COVID-19: Secondary | ICD-10-CM | POA: Diagnosis not present

## 2023-10-12 DIAGNOSIS — G4700 Insomnia, unspecified: Secondary | ICD-10-CM | POA: Diagnosis not present

## 2023-10-12 DIAGNOSIS — M48061 Spinal stenosis, lumbar region without neurogenic claudication: Secondary | ICD-10-CM | POA: Diagnosis not present

## 2023-10-12 DIAGNOSIS — K5900 Constipation, unspecified: Secondary | ICD-10-CM | POA: Diagnosis not present

## 2023-10-12 DIAGNOSIS — R1013 Epigastric pain: Secondary | ICD-10-CM | POA: Diagnosis not present

## 2023-10-12 DIAGNOSIS — Z794 Long term (current) use of insulin: Secondary | ICD-10-CM | POA: Diagnosis not present

## 2023-10-12 DIAGNOSIS — E1122 Type 2 diabetes mellitus with diabetic chronic kidney disease: Secondary | ICD-10-CM | POA: Diagnosis not present

## 2023-10-12 DIAGNOSIS — R1310 Dysphagia, unspecified: Secondary | ICD-10-CM | POA: Diagnosis not present

## 2023-10-12 DIAGNOSIS — E876 Hypokalemia: Secondary | ICD-10-CM | POA: Diagnosis not present

## 2023-10-12 DIAGNOSIS — J1282 Pneumonia due to coronavirus disease 2019: Secondary | ICD-10-CM | POA: Diagnosis not present

## 2023-10-12 DIAGNOSIS — E782 Mixed hyperlipidemia: Secondary | ICD-10-CM | POA: Diagnosis not present

## 2023-10-12 DIAGNOSIS — I129 Hypertensive chronic kidney disease with stage 1 through stage 4 chronic kidney disease, or unspecified chronic kidney disease: Secondary | ICD-10-CM | POA: Diagnosis not present

## 2023-10-12 DIAGNOSIS — N1832 Chronic kidney disease, stage 3b: Secondary | ICD-10-CM | POA: Diagnosis not present

## 2023-10-12 DIAGNOSIS — D509 Iron deficiency anemia, unspecified: Secondary | ICD-10-CM | POA: Diagnosis not present

## 2023-10-12 DIAGNOSIS — Z556 Problems related to health literacy: Secondary | ICD-10-CM | POA: Diagnosis not present

## 2023-10-12 DIAGNOSIS — Z853 Personal history of malignant neoplasm of breast: Secondary | ICD-10-CM | POA: Diagnosis not present

## 2023-10-16 ENCOUNTER — Other Ambulatory Visit: Payer: Self-pay | Admitting: Gastroenterology

## 2023-10-16 DIAGNOSIS — G4700 Insomnia, unspecified: Secondary | ICD-10-CM | POA: Diagnosis not present

## 2023-10-16 DIAGNOSIS — I129 Hypertensive chronic kidney disease with stage 1 through stage 4 chronic kidney disease, or unspecified chronic kidney disease: Secondary | ICD-10-CM | POA: Diagnosis not present

## 2023-10-16 DIAGNOSIS — E782 Mixed hyperlipidemia: Secondary | ICD-10-CM | POA: Diagnosis not present

## 2023-10-16 DIAGNOSIS — R1310 Dysphagia, unspecified: Secondary | ICD-10-CM | POA: Diagnosis not present

## 2023-10-16 DIAGNOSIS — R1013 Epigastric pain: Secondary | ICD-10-CM | POA: Diagnosis not present

## 2023-10-16 DIAGNOSIS — D509 Iron deficiency anemia, unspecified: Secondary | ICD-10-CM | POA: Diagnosis not present

## 2023-10-16 DIAGNOSIS — E876 Hypokalemia: Secondary | ICD-10-CM | POA: Diagnosis not present

## 2023-10-16 DIAGNOSIS — M48061 Spinal stenosis, lumbar region without neurogenic claudication: Secondary | ICD-10-CM | POA: Diagnosis not present

## 2023-10-16 DIAGNOSIS — K219 Gastro-esophageal reflux disease without esophagitis: Secondary | ICD-10-CM | POA: Diagnosis not present

## 2023-10-16 DIAGNOSIS — M47817 Spondylosis without myelopathy or radiculopathy, lumbosacral region: Secondary | ICD-10-CM | POA: Diagnosis not present

## 2023-10-16 DIAGNOSIS — E1122 Type 2 diabetes mellitus with diabetic chronic kidney disease: Secondary | ICD-10-CM | POA: Diagnosis not present

## 2023-10-16 DIAGNOSIS — E041 Nontoxic single thyroid nodule: Secondary | ICD-10-CM | POA: Diagnosis not present

## 2023-10-16 DIAGNOSIS — U071 COVID-19: Secondary | ICD-10-CM | POA: Diagnosis not present

## 2023-10-16 DIAGNOSIS — J1282 Pneumonia due to coronavirus disease 2019: Secondary | ICD-10-CM | POA: Diagnosis not present

## 2023-10-16 DIAGNOSIS — Z794 Long term (current) use of insulin: Secondary | ICD-10-CM | POA: Diagnosis not present

## 2023-10-16 DIAGNOSIS — K5900 Constipation, unspecified: Secondary | ICD-10-CM | POA: Diagnosis not present

## 2023-10-16 DIAGNOSIS — Z853 Personal history of malignant neoplasm of breast: Secondary | ICD-10-CM | POA: Diagnosis not present

## 2023-10-16 DIAGNOSIS — Z556 Problems related to health literacy: Secondary | ICD-10-CM | POA: Diagnosis not present

## 2023-10-16 DIAGNOSIS — N1832 Chronic kidney disease, stage 3b: Secondary | ICD-10-CM | POA: Diagnosis not present

## 2023-10-17 DIAGNOSIS — E1129 Type 2 diabetes mellitus with other diabetic kidney complication: Secondary | ICD-10-CM | POA: Diagnosis not present

## 2023-10-17 DIAGNOSIS — R1013 Epigastric pain: Secondary | ICD-10-CM | POA: Diagnosis not present

## 2023-10-17 DIAGNOSIS — N1832 Chronic kidney disease, stage 3b: Secondary | ICD-10-CM | POA: Diagnosis not present

## 2023-10-17 DIAGNOSIS — Z794 Long term (current) use of insulin: Secondary | ICD-10-CM | POA: Diagnosis not present

## 2023-10-17 DIAGNOSIS — U071 COVID-19: Secondary | ICD-10-CM | POA: Diagnosis not present

## 2023-10-17 DIAGNOSIS — K5900 Constipation, unspecified: Secondary | ICD-10-CM | POA: Diagnosis not present

## 2023-10-17 DIAGNOSIS — R1310 Dysphagia, unspecified: Secondary | ICD-10-CM | POA: Diagnosis not present

## 2023-10-17 DIAGNOSIS — M48061 Spinal stenosis, lumbar region without neurogenic claudication: Secondary | ICD-10-CM | POA: Diagnosis not present

## 2023-10-17 DIAGNOSIS — D509 Iron deficiency anemia, unspecified: Secondary | ICD-10-CM | POA: Diagnosis not present

## 2023-10-17 DIAGNOSIS — J1282 Pneumonia due to coronavirus disease 2019: Secondary | ICD-10-CM | POA: Diagnosis not present

## 2023-10-17 DIAGNOSIS — E1122 Type 2 diabetes mellitus with diabetic chronic kidney disease: Secondary | ICD-10-CM | POA: Diagnosis not present

## 2023-10-17 DIAGNOSIS — E876 Hypokalemia: Secondary | ICD-10-CM | POA: Diagnosis not present

## 2023-10-17 DIAGNOSIS — N1831 Chronic kidney disease, stage 3a: Secondary | ICD-10-CM | POA: Diagnosis not present

## 2023-10-17 DIAGNOSIS — M47817 Spondylosis without myelopathy or radiculopathy, lumbosacral region: Secondary | ICD-10-CM | POA: Diagnosis not present

## 2023-10-17 DIAGNOSIS — E041 Nontoxic single thyroid nodule: Secondary | ICD-10-CM | POA: Diagnosis not present

## 2023-10-17 DIAGNOSIS — I129 Hypertensive chronic kidney disease with stage 1 through stage 4 chronic kidney disease, or unspecified chronic kidney disease: Secondary | ICD-10-CM | POA: Diagnosis not present

## 2023-10-17 DIAGNOSIS — E782 Mixed hyperlipidemia: Secondary | ICD-10-CM | POA: Diagnosis not present

## 2023-10-17 DIAGNOSIS — Z853 Personal history of malignant neoplasm of breast: Secondary | ICD-10-CM | POA: Diagnosis not present

## 2023-10-17 DIAGNOSIS — R809 Proteinuria, unspecified: Secondary | ICD-10-CM | POA: Diagnosis not present

## 2023-10-17 DIAGNOSIS — K219 Gastro-esophageal reflux disease without esophagitis: Secondary | ICD-10-CM | POA: Diagnosis not present

## 2023-10-17 DIAGNOSIS — G4700 Insomnia, unspecified: Secondary | ICD-10-CM | POA: Diagnosis not present

## 2023-10-17 DIAGNOSIS — Z556 Problems related to health literacy: Secondary | ICD-10-CM | POA: Diagnosis not present

## 2023-10-18 DIAGNOSIS — E041 Nontoxic single thyroid nodule: Secondary | ICD-10-CM | POA: Diagnosis not present

## 2023-10-18 DIAGNOSIS — E782 Mixed hyperlipidemia: Secondary | ICD-10-CM | POA: Diagnosis not present

## 2023-10-18 DIAGNOSIS — R1310 Dysphagia, unspecified: Secondary | ICD-10-CM | POA: Diagnosis not present

## 2023-10-18 DIAGNOSIS — M47817 Spondylosis without myelopathy or radiculopathy, lumbosacral region: Secondary | ICD-10-CM | POA: Diagnosis not present

## 2023-10-18 DIAGNOSIS — E1122 Type 2 diabetes mellitus with diabetic chronic kidney disease: Secondary | ICD-10-CM | POA: Diagnosis not present

## 2023-10-18 DIAGNOSIS — Z556 Problems related to health literacy: Secondary | ICD-10-CM | POA: Diagnosis not present

## 2023-10-18 DIAGNOSIS — I739 Peripheral vascular disease, unspecified: Secondary | ICD-10-CM | POA: Diagnosis not present

## 2023-10-18 DIAGNOSIS — R1013 Epigastric pain: Secondary | ICD-10-CM | POA: Diagnosis not present

## 2023-10-18 DIAGNOSIS — Z853 Personal history of malignant neoplasm of breast: Secondary | ICD-10-CM | POA: Diagnosis not present

## 2023-10-18 DIAGNOSIS — M79672 Pain in left foot: Secondary | ICD-10-CM | POA: Diagnosis not present

## 2023-10-18 DIAGNOSIS — Z794 Long term (current) use of insulin: Secondary | ICD-10-CM | POA: Diagnosis not present

## 2023-10-18 DIAGNOSIS — L110 Acquired keratosis follicularis: Secondary | ICD-10-CM | POA: Diagnosis not present

## 2023-10-18 DIAGNOSIS — E1140 Type 2 diabetes mellitus with diabetic neuropathy, unspecified: Secondary | ICD-10-CM | POA: Diagnosis not present

## 2023-10-18 DIAGNOSIS — M79674 Pain in right toe(s): Secondary | ICD-10-CM | POA: Diagnosis not present

## 2023-10-18 DIAGNOSIS — K219 Gastro-esophageal reflux disease without esophagitis: Secondary | ICD-10-CM | POA: Diagnosis not present

## 2023-10-18 DIAGNOSIS — E876 Hypokalemia: Secondary | ICD-10-CM | POA: Diagnosis not present

## 2023-10-18 DIAGNOSIS — U071 COVID-19: Secondary | ICD-10-CM | POA: Diagnosis not present

## 2023-10-18 DIAGNOSIS — K5900 Constipation, unspecified: Secondary | ICD-10-CM | POA: Diagnosis not present

## 2023-10-18 DIAGNOSIS — G4700 Insomnia, unspecified: Secondary | ICD-10-CM | POA: Diagnosis not present

## 2023-10-18 DIAGNOSIS — I129 Hypertensive chronic kidney disease with stage 1 through stage 4 chronic kidney disease, or unspecified chronic kidney disease: Secondary | ICD-10-CM | POA: Diagnosis not present

## 2023-10-18 DIAGNOSIS — N1832 Chronic kidney disease, stage 3b: Secondary | ICD-10-CM | POA: Diagnosis not present

## 2023-10-18 DIAGNOSIS — D509 Iron deficiency anemia, unspecified: Secondary | ICD-10-CM | POA: Diagnosis not present

## 2023-10-18 DIAGNOSIS — M79671 Pain in right foot: Secondary | ICD-10-CM | POA: Diagnosis not present

## 2023-10-18 DIAGNOSIS — M48061 Spinal stenosis, lumbar region without neurogenic claudication: Secondary | ICD-10-CM | POA: Diagnosis not present

## 2023-10-18 DIAGNOSIS — M79675 Pain in left toe(s): Secondary | ICD-10-CM | POA: Diagnosis not present

## 2023-10-18 DIAGNOSIS — J1282 Pneumonia due to coronavirus disease 2019: Secondary | ICD-10-CM | POA: Diagnosis not present

## 2023-10-19 DIAGNOSIS — R1310 Dysphagia, unspecified: Secondary | ICD-10-CM | POA: Diagnosis not present

## 2023-10-19 DIAGNOSIS — K219 Gastro-esophageal reflux disease without esophagitis: Secondary | ICD-10-CM | POA: Diagnosis not present

## 2023-10-19 DIAGNOSIS — Z794 Long term (current) use of insulin: Secondary | ICD-10-CM | POA: Diagnosis not present

## 2023-10-19 DIAGNOSIS — M47817 Spondylosis without myelopathy or radiculopathy, lumbosacral region: Secondary | ICD-10-CM | POA: Diagnosis not present

## 2023-10-19 DIAGNOSIS — N1832 Chronic kidney disease, stage 3b: Secondary | ICD-10-CM | POA: Diagnosis not present

## 2023-10-19 DIAGNOSIS — M48061 Spinal stenosis, lumbar region without neurogenic claudication: Secondary | ICD-10-CM | POA: Diagnosis not present

## 2023-10-19 DIAGNOSIS — E041 Nontoxic single thyroid nodule: Secondary | ICD-10-CM | POA: Diagnosis not present

## 2023-10-19 DIAGNOSIS — G4700 Insomnia, unspecified: Secondary | ICD-10-CM | POA: Diagnosis not present

## 2023-10-19 DIAGNOSIS — R1013 Epigastric pain: Secondary | ICD-10-CM | POA: Diagnosis not present

## 2023-10-19 DIAGNOSIS — I129 Hypertensive chronic kidney disease with stage 1 through stage 4 chronic kidney disease, or unspecified chronic kidney disease: Secondary | ICD-10-CM | POA: Diagnosis not present

## 2023-10-19 DIAGNOSIS — U071 COVID-19: Secondary | ICD-10-CM | POA: Diagnosis not present

## 2023-10-19 DIAGNOSIS — E1122 Type 2 diabetes mellitus with diabetic chronic kidney disease: Secondary | ICD-10-CM | POA: Diagnosis not present

## 2023-10-19 DIAGNOSIS — J1282 Pneumonia due to coronavirus disease 2019: Secondary | ICD-10-CM | POA: Diagnosis not present

## 2023-10-19 DIAGNOSIS — E782 Mixed hyperlipidemia: Secondary | ICD-10-CM | POA: Diagnosis not present

## 2023-10-19 DIAGNOSIS — Z556 Problems related to health literacy: Secondary | ICD-10-CM | POA: Diagnosis not present

## 2023-10-19 DIAGNOSIS — E876 Hypokalemia: Secondary | ICD-10-CM | POA: Diagnosis not present

## 2023-10-19 DIAGNOSIS — Z853 Personal history of malignant neoplasm of breast: Secondary | ICD-10-CM | POA: Diagnosis not present

## 2023-10-19 DIAGNOSIS — K5900 Constipation, unspecified: Secondary | ICD-10-CM | POA: Diagnosis not present

## 2023-10-19 DIAGNOSIS — D509 Iron deficiency anemia, unspecified: Secondary | ICD-10-CM | POA: Diagnosis not present

## 2023-10-22 ENCOUNTER — Other Ambulatory Visit: Payer: Self-pay | Admitting: Nurse Practitioner

## 2023-10-22 DIAGNOSIS — K5900 Constipation, unspecified: Secondary | ICD-10-CM | POA: Diagnosis not present

## 2023-10-22 DIAGNOSIS — E782 Mixed hyperlipidemia: Secondary | ICD-10-CM | POA: Diagnosis not present

## 2023-10-22 DIAGNOSIS — I129 Hypertensive chronic kidney disease with stage 1 through stage 4 chronic kidney disease, or unspecified chronic kidney disease: Secondary | ICD-10-CM | POA: Diagnosis not present

## 2023-10-22 DIAGNOSIS — J1282 Pneumonia due to coronavirus disease 2019: Secondary | ICD-10-CM | POA: Diagnosis not present

## 2023-10-22 DIAGNOSIS — Z556 Problems related to health literacy: Secondary | ICD-10-CM | POA: Diagnosis not present

## 2023-10-22 DIAGNOSIS — E1122 Type 2 diabetes mellitus with diabetic chronic kidney disease: Secondary | ICD-10-CM | POA: Diagnosis not present

## 2023-10-22 DIAGNOSIS — E041 Nontoxic single thyroid nodule: Secondary | ICD-10-CM | POA: Diagnosis not present

## 2023-10-22 DIAGNOSIS — M47817 Spondylosis without myelopathy or radiculopathy, lumbosacral region: Secondary | ICD-10-CM | POA: Diagnosis not present

## 2023-10-22 DIAGNOSIS — E876 Hypokalemia: Secondary | ICD-10-CM | POA: Diagnosis not present

## 2023-10-22 DIAGNOSIS — R1013 Epigastric pain: Secondary | ICD-10-CM | POA: Diagnosis not present

## 2023-10-22 DIAGNOSIS — G4700 Insomnia, unspecified: Secondary | ICD-10-CM | POA: Diagnosis not present

## 2023-10-22 DIAGNOSIS — Z794 Long term (current) use of insulin: Secondary | ICD-10-CM | POA: Diagnosis not present

## 2023-10-22 DIAGNOSIS — D509 Iron deficiency anemia, unspecified: Secondary | ICD-10-CM | POA: Diagnosis not present

## 2023-10-22 DIAGNOSIS — Z853 Personal history of malignant neoplasm of breast: Secondary | ICD-10-CM | POA: Diagnosis not present

## 2023-10-22 DIAGNOSIS — M48061 Spinal stenosis, lumbar region without neurogenic claudication: Secondary | ICD-10-CM | POA: Diagnosis not present

## 2023-10-22 DIAGNOSIS — K219 Gastro-esophageal reflux disease without esophagitis: Secondary | ICD-10-CM | POA: Diagnosis not present

## 2023-10-22 DIAGNOSIS — U071 COVID-19: Secondary | ICD-10-CM | POA: Diagnosis not present

## 2023-10-22 DIAGNOSIS — R1310 Dysphagia, unspecified: Secondary | ICD-10-CM | POA: Diagnosis not present

## 2023-10-22 DIAGNOSIS — N1832 Chronic kidney disease, stage 3b: Secondary | ICD-10-CM | POA: Diagnosis not present

## 2023-10-23 DIAGNOSIS — G4700 Insomnia, unspecified: Secondary | ICD-10-CM | POA: Diagnosis not present

## 2023-10-23 DIAGNOSIS — K5900 Constipation, unspecified: Secondary | ICD-10-CM | POA: Diagnosis not present

## 2023-10-23 DIAGNOSIS — E1122 Type 2 diabetes mellitus with diabetic chronic kidney disease: Secondary | ICD-10-CM | POA: Diagnosis not present

## 2023-10-23 DIAGNOSIS — Z853 Personal history of malignant neoplasm of breast: Secondary | ICD-10-CM | POA: Diagnosis not present

## 2023-10-23 DIAGNOSIS — K219 Gastro-esophageal reflux disease without esophagitis: Secondary | ICD-10-CM | POA: Diagnosis not present

## 2023-10-23 DIAGNOSIS — N1832 Chronic kidney disease, stage 3b: Secondary | ICD-10-CM | POA: Diagnosis not present

## 2023-10-23 DIAGNOSIS — M47817 Spondylosis without myelopathy or radiculopathy, lumbosacral region: Secondary | ICD-10-CM | POA: Diagnosis not present

## 2023-10-23 DIAGNOSIS — R1013 Epigastric pain: Secondary | ICD-10-CM | POA: Diagnosis not present

## 2023-10-23 DIAGNOSIS — E041 Nontoxic single thyroid nodule: Secondary | ICD-10-CM | POA: Diagnosis not present

## 2023-10-23 DIAGNOSIS — M48061 Spinal stenosis, lumbar region without neurogenic claudication: Secondary | ICD-10-CM | POA: Diagnosis not present

## 2023-10-23 DIAGNOSIS — E876 Hypokalemia: Secondary | ICD-10-CM | POA: Diagnosis not present

## 2023-10-23 DIAGNOSIS — J1282 Pneumonia due to coronavirus disease 2019: Secondary | ICD-10-CM | POA: Diagnosis not present

## 2023-10-23 DIAGNOSIS — E782 Mixed hyperlipidemia: Secondary | ICD-10-CM | POA: Diagnosis not present

## 2023-10-23 DIAGNOSIS — Z794 Long term (current) use of insulin: Secondary | ICD-10-CM | POA: Diagnosis not present

## 2023-10-23 DIAGNOSIS — U071 COVID-19: Secondary | ICD-10-CM | POA: Diagnosis not present

## 2023-10-23 DIAGNOSIS — D509 Iron deficiency anemia, unspecified: Secondary | ICD-10-CM | POA: Diagnosis not present

## 2023-10-23 DIAGNOSIS — R1310 Dysphagia, unspecified: Secondary | ICD-10-CM | POA: Diagnosis not present

## 2023-10-23 DIAGNOSIS — Z556 Problems related to health literacy: Secondary | ICD-10-CM | POA: Diagnosis not present

## 2023-10-23 DIAGNOSIS — I129 Hypertensive chronic kidney disease with stage 1 through stage 4 chronic kidney disease, or unspecified chronic kidney disease: Secondary | ICD-10-CM | POA: Diagnosis not present

## 2023-10-24 ENCOUNTER — Other Ambulatory Visit: Payer: Self-pay | Admitting: *Deleted

## 2023-10-24 DIAGNOSIS — Z794 Long term (current) use of insulin: Secondary | ICD-10-CM

## 2023-10-24 DIAGNOSIS — Z7984 Long term (current) use of oral hypoglycemic drugs: Secondary | ICD-10-CM

## 2023-10-24 DIAGNOSIS — N1832 Chronic kidney disease, stage 3b: Secondary | ICD-10-CM

## 2023-10-24 DIAGNOSIS — Z7985 Long-term (current) use of injectable non-insulin antidiabetic drugs: Secondary | ICD-10-CM

## 2023-10-24 MED ORDER — TRULICITY 3 MG/0.5ML ~~LOC~~ SOAJ: 3.0000 mg | SUBCUTANEOUS | 1 refills | Status: DC

## 2023-10-25 DIAGNOSIS — E1122 Type 2 diabetes mellitus with diabetic chronic kidney disease: Secondary | ICD-10-CM | POA: Diagnosis not present

## 2023-10-25 DIAGNOSIS — M48061 Spinal stenosis, lumbar region without neurogenic claudication: Secondary | ICD-10-CM | POA: Diagnosis not present

## 2023-10-25 DIAGNOSIS — Z556 Problems related to health literacy: Secondary | ICD-10-CM | POA: Diagnosis not present

## 2023-10-25 DIAGNOSIS — M47817 Spondylosis without myelopathy or radiculopathy, lumbosacral region: Secondary | ICD-10-CM | POA: Diagnosis not present

## 2023-10-25 DIAGNOSIS — E041 Nontoxic single thyroid nodule: Secondary | ICD-10-CM | POA: Diagnosis not present

## 2023-10-25 DIAGNOSIS — Z853 Personal history of malignant neoplasm of breast: Secondary | ICD-10-CM | POA: Diagnosis not present

## 2023-10-25 DIAGNOSIS — D509 Iron deficiency anemia, unspecified: Secondary | ICD-10-CM | POA: Diagnosis not present

## 2023-10-25 DIAGNOSIS — K219 Gastro-esophageal reflux disease without esophagitis: Secondary | ICD-10-CM | POA: Diagnosis not present

## 2023-10-25 DIAGNOSIS — E782 Mixed hyperlipidemia: Secondary | ICD-10-CM | POA: Diagnosis not present

## 2023-10-25 DIAGNOSIS — I10 Essential (primary) hypertension: Secondary | ICD-10-CM | POA: Diagnosis not present

## 2023-10-25 DIAGNOSIS — I129 Hypertensive chronic kidney disease with stage 1 through stage 4 chronic kidney disease, or unspecified chronic kidney disease: Secondary | ICD-10-CM | POA: Diagnosis not present

## 2023-10-25 DIAGNOSIS — N1832 Chronic kidney disease, stage 3b: Secondary | ICD-10-CM | POA: Diagnosis not present

## 2023-10-25 DIAGNOSIS — Z794 Long term (current) use of insulin: Secondary | ICD-10-CM | POA: Diagnosis not present

## 2023-10-25 DIAGNOSIS — K5900 Constipation, unspecified: Secondary | ICD-10-CM | POA: Diagnosis not present

## 2023-10-25 DIAGNOSIS — R1310 Dysphagia, unspecified: Secondary | ICD-10-CM | POA: Diagnosis not present

## 2023-10-25 DIAGNOSIS — R1013 Epigastric pain: Secondary | ICD-10-CM | POA: Diagnosis not present

## 2023-10-25 DIAGNOSIS — E876 Hypokalemia: Secondary | ICD-10-CM | POA: Diagnosis not present

## 2023-10-25 DIAGNOSIS — G4700 Insomnia, unspecified: Secondary | ICD-10-CM | POA: Diagnosis not present

## 2023-10-29 DIAGNOSIS — E782 Mixed hyperlipidemia: Secondary | ICD-10-CM | POA: Diagnosis not present

## 2023-10-29 DIAGNOSIS — I129 Hypertensive chronic kidney disease with stage 1 through stage 4 chronic kidney disease, or unspecified chronic kidney disease: Secondary | ICD-10-CM | POA: Diagnosis not present

## 2023-10-29 DIAGNOSIS — G4700 Insomnia, unspecified: Secondary | ICD-10-CM | POA: Diagnosis not present

## 2023-10-29 DIAGNOSIS — R1013 Epigastric pain: Secondary | ICD-10-CM | POA: Diagnosis not present

## 2023-10-29 DIAGNOSIS — Z794 Long term (current) use of insulin: Secondary | ICD-10-CM | POA: Diagnosis not present

## 2023-10-29 DIAGNOSIS — U071 COVID-19: Secondary | ICD-10-CM | POA: Diagnosis not present

## 2023-10-29 DIAGNOSIS — E1122 Type 2 diabetes mellitus with diabetic chronic kidney disease: Secondary | ICD-10-CM | POA: Diagnosis not present

## 2023-10-29 DIAGNOSIS — N1832 Chronic kidney disease, stage 3b: Secondary | ICD-10-CM | POA: Diagnosis not present

## 2023-10-29 DIAGNOSIS — K5900 Constipation, unspecified: Secondary | ICD-10-CM | POA: Diagnosis not present

## 2023-10-29 DIAGNOSIS — K219 Gastro-esophageal reflux disease without esophagitis: Secondary | ICD-10-CM | POA: Diagnosis not present

## 2023-10-29 DIAGNOSIS — M47817 Spondylosis without myelopathy or radiculopathy, lumbosacral region: Secondary | ICD-10-CM | POA: Diagnosis not present

## 2023-10-29 DIAGNOSIS — D509 Iron deficiency anemia, unspecified: Secondary | ICD-10-CM | POA: Diagnosis not present

## 2023-10-29 DIAGNOSIS — M48061 Spinal stenosis, lumbar region without neurogenic claudication: Secondary | ICD-10-CM | POA: Diagnosis not present

## 2023-10-29 DIAGNOSIS — J1282 Pneumonia due to coronavirus disease 2019: Secondary | ICD-10-CM | POA: Diagnosis not present

## 2023-10-29 DIAGNOSIS — Z853 Personal history of malignant neoplasm of breast: Secondary | ICD-10-CM | POA: Diagnosis not present

## 2023-10-29 DIAGNOSIS — R1310 Dysphagia, unspecified: Secondary | ICD-10-CM | POA: Diagnosis not present

## 2023-10-29 DIAGNOSIS — E876 Hypokalemia: Secondary | ICD-10-CM | POA: Diagnosis not present

## 2023-10-29 DIAGNOSIS — E041 Nontoxic single thyroid nodule: Secondary | ICD-10-CM | POA: Diagnosis not present

## 2023-10-29 DIAGNOSIS — Z556 Problems related to health literacy: Secondary | ICD-10-CM | POA: Diagnosis not present

## 2023-10-30 DIAGNOSIS — E876 Hypokalemia: Secondary | ICD-10-CM | POA: Diagnosis not present

## 2023-10-30 DIAGNOSIS — E041 Nontoxic single thyroid nodule: Secondary | ICD-10-CM | POA: Diagnosis not present

## 2023-10-30 DIAGNOSIS — R1310 Dysphagia, unspecified: Secondary | ICD-10-CM | POA: Diagnosis not present

## 2023-10-30 DIAGNOSIS — Z556 Problems related to health literacy: Secondary | ICD-10-CM | POA: Diagnosis not present

## 2023-10-30 DIAGNOSIS — Z794 Long term (current) use of insulin: Secondary | ICD-10-CM | POA: Diagnosis not present

## 2023-10-30 DIAGNOSIS — N1832 Chronic kidney disease, stage 3b: Secondary | ICD-10-CM | POA: Diagnosis not present

## 2023-10-30 DIAGNOSIS — R1013 Epigastric pain: Secondary | ICD-10-CM | POA: Diagnosis not present

## 2023-10-30 DIAGNOSIS — J1282 Pneumonia due to coronavirus disease 2019: Secondary | ICD-10-CM | POA: Diagnosis not present

## 2023-10-30 DIAGNOSIS — G4700 Insomnia, unspecified: Secondary | ICD-10-CM | POA: Diagnosis not present

## 2023-10-30 DIAGNOSIS — E782 Mixed hyperlipidemia: Secondary | ICD-10-CM | POA: Diagnosis not present

## 2023-10-30 DIAGNOSIS — M47817 Spondylosis without myelopathy or radiculopathy, lumbosacral region: Secondary | ICD-10-CM | POA: Diagnosis not present

## 2023-10-30 DIAGNOSIS — D509 Iron deficiency anemia, unspecified: Secondary | ICD-10-CM | POA: Diagnosis not present

## 2023-10-30 DIAGNOSIS — Z853 Personal history of malignant neoplasm of breast: Secondary | ICD-10-CM | POA: Diagnosis not present

## 2023-10-30 DIAGNOSIS — U071 COVID-19: Secondary | ICD-10-CM | POA: Diagnosis not present

## 2023-10-30 DIAGNOSIS — K5900 Constipation, unspecified: Secondary | ICD-10-CM | POA: Diagnosis not present

## 2023-10-30 DIAGNOSIS — E1122 Type 2 diabetes mellitus with diabetic chronic kidney disease: Secondary | ICD-10-CM | POA: Diagnosis not present

## 2023-10-30 DIAGNOSIS — M48061 Spinal stenosis, lumbar region without neurogenic claudication: Secondary | ICD-10-CM | POA: Diagnosis not present

## 2023-10-30 DIAGNOSIS — I129 Hypertensive chronic kidney disease with stage 1 through stage 4 chronic kidney disease, or unspecified chronic kidney disease: Secondary | ICD-10-CM | POA: Diagnosis not present

## 2023-10-30 DIAGNOSIS — K219 Gastro-esophageal reflux disease without esophagitis: Secondary | ICD-10-CM | POA: Diagnosis not present

## 2023-10-31 ENCOUNTER — Other Ambulatory Visit (INDEPENDENT_AMBULATORY_CARE_PROVIDER_SITE_OTHER)

## 2023-10-31 ENCOUNTER — Encounter: Payer: Self-pay | Admitting: Orthopaedic Surgery

## 2023-10-31 ENCOUNTER — Ambulatory Visit (INDEPENDENT_AMBULATORY_CARE_PROVIDER_SITE_OTHER): Admitting: Orthopaedic Surgery

## 2023-10-31 VITALS — BP 181/96 | HR 85 | Ht 63.0 in | Wt 169.0 lb

## 2023-10-31 DIAGNOSIS — E1165 Type 2 diabetes mellitus with hyperglycemia: Secondary | ICD-10-CM | POA: Diagnosis not present

## 2023-10-31 DIAGNOSIS — G8929 Other chronic pain: Secondary | ICD-10-CM | POA: Diagnosis not present

## 2023-10-31 DIAGNOSIS — I129 Hypertensive chronic kidney disease with stage 1 through stage 4 chronic kidney disease, or unspecified chronic kidney disease: Secondary | ICD-10-CM | POA: Diagnosis not present

## 2023-10-31 DIAGNOSIS — E782 Mixed hyperlipidemia: Secondary | ICD-10-CM | POA: Diagnosis not present

## 2023-10-31 DIAGNOSIS — M25512 Pain in left shoulder: Secondary | ICD-10-CM

## 2023-10-31 DIAGNOSIS — M25552 Pain in left hip: Secondary | ICD-10-CM | POA: Diagnosis not present

## 2023-10-31 DIAGNOSIS — R945 Abnormal results of liver function studies: Secondary | ICD-10-CM | POA: Diagnosis not present

## 2023-10-31 DIAGNOSIS — K30 Functional dyspepsia: Secondary | ICD-10-CM | POA: Diagnosis not present

## 2023-10-31 DIAGNOSIS — R0600 Dyspnea, unspecified: Secondary | ICD-10-CM | POA: Diagnosis not present

## 2023-10-31 DIAGNOSIS — D509 Iron deficiency anemia, unspecified: Secondary | ICD-10-CM | POA: Diagnosis not present

## 2023-10-31 DIAGNOSIS — N1831 Chronic kidney disease, stage 3a: Secondary | ICD-10-CM | POA: Diagnosis not present

## 2023-10-31 DIAGNOSIS — E041 Nontoxic single thyroid nodule: Secondary | ICD-10-CM | POA: Diagnosis not present

## 2023-10-31 DIAGNOSIS — K5900 Constipation, unspecified: Secondary | ICD-10-CM | POA: Diagnosis not present

## 2023-10-31 DIAGNOSIS — K219 Gastro-esophageal reflux disease without esophagitis: Secondary | ICD-10-CM | POA: Diagnosis not present

## 2023-10-31 MED ORDER — METHYLPREDNISOLONE ACETATE 40 MG/ML IJ SUSP
40.0000 mg | Freq: Once | INTRAMUSCULAR | Status: AC
  Administered 2023-10-31: 40 mg via INTRA_ARTICULAR

## 2023-10-31 NOTE — Progress Notes (Signed)
 Subjective:    Patient ID: Stephanie Moreno, female    DOB: 1945-12-08, 78 y.o.   MRN: [id number]  HPI She has had pain in the left shoulder for about two months.  About six weeks ago she went to [facility] for the shoulder.  She was having problems lifting her arm and laying on it at night.  They gave her an injection but it did not help that much.  Two weeks after that, she saw Dr. Zack Hall and was given medicine that has not helped.  This was about three weeks ago.  She still has pain in the shoulder.  She has no swelling, no redness.  She has some numbness at times but infrequently.  She has no trauma.   Review of Systems  Constitutional:  Positive for activity change.  Musculoskeletal:  Positive for arthralgias and myalgias.  All other systems reviewed and are negative.  For Review of Systems, all other systems reviewed and are negative.  The following is a summary of the past history medically, past history surgically, known current medicines, social history and family history.  This information is gathered electronically by the computer from prior information and documentation.  I review this each visit and have found including this information at this point in the chart is beneficial and informative.   Past Medical History:  Diagnosis Date   Acid reflux disease    Anemia    Anxiety    Asthma    Breast cancer (HCC)    left side   Chronic back pain    Chronic kidney disease    Depression    Diabetes mellitus without complication (HCC)    Diverticulitis    Hypertension    Lumbosacral spondylosis    Per previous PCP records   Sleep apnea    Thyroid  disease     Past Surgical History:  Procedure Laterality Date   ABDOMINAL HYSTERECTOMY     BACK SURGERY     BALLOON DILATION N/A 04/24/2022   Procedure: BALLOON DILATION;  Surgeon: Vinetta Greening, DO;  [facility];  Service: Endoscopy;  Laterality: N/A;   BIOPSY  02/19/2020   Procedure: BIOPSY;   Surgeon: Vinetta Greening, DO;  [facility];  Service: Endoscopy;;   BIOPSY  04/24/2022   Procedure: BIOPSY;  Surgeon: Vinetta Greening, DO;  [facility];  Service: Endoscopy;;   BREAST BIOPSY Right    x 3   BREAST LUMPECTOMY Left 2003   COLONOSCOPY WITH PROPOFOL  N/A 02/19/2020   non-bleeding internal hemorrhoids, many small-mouthed diverticula in entire colon.   ESOPHAGOGASTRODUODENOSCOPY (EGD) WITH PROPOFOL  N/A 02/19/2020    benign-appearing esophageal stenosis s/p dilation, gastritis s/p biopsy, normal duodenum. Negative H.pylori, negative duodenal biopsy.    ESOPHAGOGASTRODUODENOSCOPY (EGD) WITH PROPOFOL  N/A 04/24/2022   small hiatal hernia, mild Schatzki ring, s/p dilation. Gastritis s/p biopsy. Negative H.pylori.   GIVENS CAPSULE STUDY N/A 06/14/2020   Procedure: GIVENS CAPSULE STUDY;  Surgeon: Vinetta Greening, DO;  [facility];  Service: Endoscopy;  Laterality: N/A;  7:30am   THYROIDECTOMY     tummy tuck     at time of breast surgery 2003    Current Outpatient Medications on File Prior to Visit  Medication Sig Dispense Refill   acetaminophen  (TYLENOL ) 325 MG tablet Take 650 mg by mouth every 4 (four) hours as needed for mild pain (pain score 1-3).     amLODipine  (NORVASC ) 10 MG tablet Take  5 mg by mouth daily.     Azelastine HCl 137 MCG/SPRAY SOLN Place 2 sprays into both nostrils 2 (two) times daily.     Blood Glucose Monitoring Suppl (ONETOUCH VERIO) w/Device KIT Use to check glucose twice daily, use as backup to CGM 1 kit 0   Continuous Blood Gluc Sensor (FREESTYLE LIBRE 2 SENSOR) MISC 2 each by Does not apply route every 14 (fourteen) days. 2 each 2   Continuous Glucose Receiver (FREESTYLE LIBRE 2 READER) DEVI USE TO CHECK GLUCOSE AS DIRECTED 1 each 0   Continuous Glucose Receiver (FREESTYLE LIBRE 3 READER) DEVI Use the Reader to monitor your blood sugar readings daily 1 each 0   Continuous Glucose Sensor (FREESTYLE LIBRE 14 DAY SENSOR)  MISC APPLY NEW SENSOR EVERY 14 DAYS *NEW PRESCRIPTION REQUEST* 6 each 1   Continuous Glucose Sensor (FREESTYLE LIBRE 3 PLUS SENSOR) MISC Change sensor every 15 days. 2 each 6   Continuous Glucose Sensor (FREESTYLE LIBRE 3 PLUS SENSOR) MISC Change sensor every 15 days as directed to monitor blood sugar. 6 each 1   Continuous Glucose Sensor (FREESTYLE LIBRE 3 SENSOR) MISC 1 Application by Does not apply route every 14 (fourteen) days. 2 each 2   Dulaglutide  (TRULICITY ) 3 MG/0.5ML SOAJ Inject 3 mg into the skin once a week. 6 mL 1   fluticasone  (FLOVENT  HFA) 220 MCG/ACT inhaler Inhale 1 puff into the lungs 2 (two) times daily.     fluticasone  furoate-vilanterol (BREO ELLIPTA ) 100-25 MCG/ACT AEPB Inhale 1 puff into the lungs daily. 90 each 3   furosemide  (LASIX ) 20 MG tablet Take 1 tablet (20 mg total) by mouth every other day. Takes 40 mg every other day. On alternate days take 80 mg (Patient taking differently: Take 20 mg by mouth 2 (two) times daily.)     hydrALAZINE (APRESOLINE) 50 MG tablet Take 50 mg by mouth 2 (two) times daily.     HYDROcodone -acetaminophen  (NORCO) 7.5-325 MG tablet Take 1 tablet by mouth 2 (two) times daily as needed for moderate pain (pain score 4-6).     Insulin  Degludec (TRESIBA ) 100 UNIT/ML SOLN Inject 12 Units into the skin at bedtime.     Insulin  Pen Needle (BD PEN NEEDLE MICRO U/F) 32G X 6 MM MISC Inject 1 each into the skin at bedtime. 100 each 1   Insulin  Pen Needle 31G X 6 MM MISC 1 each by Does not apply route daily. 100 each 3   Ipratropium-Albuterol  (COMBIVENT  RESPIMAT) 20-100 MCG/ACT AERS respimat Inhale 1 puff into the lungs every 6 (six) hours as needed. 4 g 1   levocetirizine (XYZAL ) 5 MG tablet TAKE 1 TABLET BY MOUTH EVERY EVENING AS NEEDED FOR ALLERGIES (Patient taking differently: Take 5 mg by mouth every evening.) 30 tablet 10   lidocaine  (LIDODERM ) 5 % Place 1 patch onto the skin daily as needed (pain).     losartan  (COZAAR ) 25 MG tablet Take 25 mg by mouth  daily.     losartan  (COZAAR ) 50 MG tablet Take 50 mg by mouth daily.     lubiprostone  (AMITIZA ) 8 MCG capsule Take 1 capsule (8 mcg total) by mouth 2 (two) times daily with a meal. 180 capsule 3   meloxicam  (MOBIC ) 7.5 MG tablet Take 1 tablet (7.5 mg total) by mouth 2 (two) times daily. 60 tablet 10   mirabegron  ER (MYRBETRIQ ) 50 MG TB24 tablet TAKE 1 TABLET BY MOUTH EVERY DAY AT 9 AM *NEW PRESCRIPTION REQUEST* 30 tablet 10   omeprazole  (PRILOSEC)  40 MG capsule Take 1 capsule (40 mg total) by mouth daily before breakfast. 90 capsule 3   ondansetron  (ZOFRAN ) 4 MG tablet Take 1 tablet (4 mg total) by mouth every 8 (eight) hours as needed for nausea or vomiting. 30 tablet 1   OVER THE COUNTER MEDICATION Prevagine one per day for memory.     pantoprazole  (PROTONIX ) 40 MG tablet Take 1 tablet (40 mg total) by mouth 2 (two) times daily before a meal. 180 tablet 3   rosuvastatin  (CRESTOR ) 10 MG tablet Take 10 mg by mouth daily.     sucralfate  (CARAFATE ) 1 g tablet TAKE 1 TABLET BY MOUTH FOUR TIMES DAILY WITH MEALS AND AT BEDTIME 120 tablet 3   No current facility-administered medications on file prior to visit.    Social History   Socioeconomic History   Marital status: Divorced    Spouse name: Not on file   Number of children: Not on file   Years of education: Not on file   Highest education level: Not on file  Occupational History   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 [place]   Tobacco Use   Smoking status: Never   Smokeless tobacco: Never  Vaping Use   Vaping status: Never Used  Substance and Sexual Activity   Alcohol use: Not Currently   Drug use: Not Currently   Sexual activity: Not on file  Other Topics Concern   Not on file  Social History Narrative   Not on file   Social Drivers of Health   Financial Resource Strain: Not on file  Food Insecurity: No Food Insecurity (08/31/2023)   Hunger Vital Sign    Worried About Running Out of Food in the Last  Year: Never true    Ran Out of Food in the Last Year: Never true  Transportation Needs: No Transportation Needs (08/31/2023)   PRAPARE - Transportation    Lack of Transportation (Medical): No    Lack of Transportation (Non-Medical): No  Recent Concern: Transportation Needs - Unmet Transportation Needs (08/28/2023)   PRAPARE - Administrator, Civil Service (Medical): Yes    Lack of Transportation (Non-Medical): No  Physical Activity: Not on file  Stress: Not on file  Social Connections: Moderately Integrated (08/28/2023)   Social Connection and Isolation Panel [NHANES]    Frequency of Communication with Friends and Family: Three times a week    Frequency of Social Gatherings with Friends and Family: Once a week    Attends Religious Services: More than 4 times per year    Active Member of [employer] or Organizations: No    Attends Engineer, structural: More than 4 times per year    Marital Status: Divorced  Catering manager Violence: Not At Risk (08/31/2023)   Humiliation, Afraid, Rape, and Kick questionnaire    Fear of Current or Ex-Partner: No    Emotionally Abused: No    Physically Abused: No    Sexually Abused: No    Family History  Problem Relation Age of Onset   Diabetes Mother    Thyroid  disease Mother    Colon cancer Neg Hx     BP (!) 181/96 Comment: patient will monitor see PCP if remains high  Pulse 85   Ht 5\' 3"  (1.6 m)   Wt 169 lb (76.7 kg)   BMI 29.94 kg/m   Body mass index is 29.94 kg/m.     Objective:   Physical Exam Vitals and nursing note  reviewed. Exam conducted with a chaperone present.  Constitutional:      Appearance: She is well-developed.  HENT:     Head: Normocephalic and atraumatic.  Eyes:     Conjunctiva/sclera: Conjunctivae normal.     Pupils: Pupils are equal, round, and reactive to light.  Cardiovascular:     Rate and Rhythm: Normal rate and regular rhythm.  Pulmonary:     Effort: Pulmonary effort is normal.  Abdominal:      Palpations: Abdomen is soft.  Musculoskeletal:       Arms:     Cervical back: Normal range of motion and neck supple.  Skin:    General: Skin is warm and dry.  Neurological:     Mental Status: She is alert and oriented to person, place, and time.     Cranial Nerves: No cranial nerve deficit.     Motor: No abnormal muscle tone.     Coordination: Coordination normal.     Deep Tendon Reflexes: Reflexes are normal and symmetric. Reflexes normal.  Psychiatric:        Behavior: Behavior normal.        Thought Content: Thought content normal.        Judgment: Judgment normal.   X-rays were done of the left shoulder, reported separately.        Assessment & Plan:   Encounter Diagnosis  Name Primary?   Chronic left shoulder pain Yes   PROCEDURE NOTE:  The patient request injection, verbal consent was obtained.  The left shoulder was prepped appropriately after time out was performed.   Sterile technique was observed and injection of 1 cc of DepoMedrol 40mg  with several cc's of plain xylocaine . Anesthesia was provided by ethyl chloride and a 20-gauge needle was used to inject the shoulder area. A posterior approach was used.  The injection was tolerated well.  A band aid dressing was applied.  The patient was advised to apply ice later today and tomorrow to the injection sight as needed.  I will see her in two weeks.  Call if any problem.  Precautions discussed.  Electronically Signed Pleasant Brilliant, MD 4/30/202510:42 AM

## 2023-11-12 ENCOUNTER — Encounter: Payer: Self-pay | Admitting: Orthopedic Surgery

## 2023-11-12 ENCOUNTER — Ambulatory Visit (HOSPITAL_COMMUNITY)
Admission: RE | Admit: 2023-11-12 | Discharge: 2023-11-12 | Disposition: A | Discharge status: Home / Self Care | Source: Ambulatory Visit | Attending: Orthopedic Surgery | Admitting: Orthopedic Surgery

## 2023-11-12 ENCOUNTER — Ambulatory Visit (INDEPENDENT_AMBULATORY_CARE_PROVIDER_SITE_OTHER): Payer: Self-pay

## 2023-11-12 ENCOUNTER — Ambulatory Visit: Admitting: Orthopedic Surgery

## 2023-11-12 VITALS — BP 152/94 | HR 91

## 2023-11-12 DIAGNOSIS — M1612 Unilateral primary osteoarthritis, left hip: Secondary | ICD-10-CM | POA: Diagnosis not present

## 2023-11-12 DIAGNOSIS — M25552 Pain in left hip: Secondary | ICD-10-CM

## 2023-11-12 MED ORDER — HYDROCODONE-ACETAMINOPHEN 7.5-325 MG PO TABS: 1.0000 | ORAL_TABLET | Freq: Two times a day (BID) | ORAL | 0 refills | Status: AC | PRN

## 2023-11-12 NOTE — Progress Notes (Signed)
 Chief Complaint  Patient presents with   Hip Pain    Left fell a week ago walked around at first but last two or 3 days pain is worse    Hpi 78 years old history of diabetes hypertension kidney disease status post lumbar fusion fell a week ago complains of left hip pain initially it was not too bad but now it is worse she has been able to walk and ambulate but the pain is increasing and is located over the left lateral hip  Physical Exam Vitals and nursing note reviewed.  Constitutional:      Appearance: Normal appearance.  HENT:     Head: Normocephalic and atraumatic.  Eyes:     General: No scleral icterus.       Right eye: No discharge.        Left eye: No discharge.     Extraocular Movements: Extraocular movements intact.     Conjunctiva/sclera: Conjunctivae normal.     Pupils: Pupils are equal, round, and reactive to light.  Cardiovascular:     Rate and Rhythm: Normal rate.     Pulses: Normal pulses.  Musculoskeletal:     Comments: Painful left hip tenderness over the lateral thigh painful range of motion of the left hip.  The way she is sitting in the chair I cannot tell if there is actual shortening or rotation    Skin:    General: Skin is warm and dry.     Capillary Refill: Capillary refill takes less than 2 seconds.  Neurological:     General: No focal deficit present.     Mental Status: She is alert and oriented to person, place, and time.  Psychiatric:        Mood and Affect: Mood normal.        Behavior: Behavior normal.        Thought Content: Thought content normal.        Judgment: Judgment normal.     BP (!) 152/94   Pulse 91     Past Medical History:  Diagnosis Date   Acid reflux disease    Anemia    Anxiety    Asthma    Breast cancer (HCC)    left side   Chronic back pain    Chronic kidney disease    Depression    Diabetes mellitus without complication (HCC)    Diverticulitis    Hypertension    Lumbosacral spondylosis    Per previous PCP  records   Sleep apnea    Thyroid  disease     Past Surgical History:  Procedure Laterality Date   ABDOMINAL HYSTERECTOMY     BACK SURGERY     BALLOON DILATION N/A 04/24/2022   Procedure: BALLOON DILATION;  Surgeon: Vinetta Greening, DO;  [facility];  Service: Endoscopy;  Laterality: N/A;   BIOPSY  02/19/2020   Procedure: BIOPSY;  Surgeon: Vinetta Greening, DO;  [facility];  Service: Endoscopy;;   BIOPSY  04/24/2022   Procedure: BIOPSY;  Surgeon: Vinetta Greening, DO;  [facility]: [facility];  Service: Endoscopy;;   BREAST BIOPSY Right    x 3   BREAST LUMPECTOMY Left 2003   COLONOSCOPY WITH PROPOFOL  N/A 02/19/2020   non-bleeding internal hemorrhoids, many small-mouthed diverticula in entire colon.   ESOPHAGOGASTRODUODENOSCOPY (EGD) WITH PROPOFOL  N/A 02/19/2020    benign-appearing esophageal stenosis s/p dilation, gastritis s/p biopsy, normal duodenum. Negative H.pylori, negative duodenal biopsy.    ESOPHAGOGASTRODUODENOSCOPY (EGD)  WITH PROPOFOL  N/A 04/24/2022   small hiatal hernia, mild Schatzki ring, s/p dilation. Gastritis s/p biopsy. Negative H.pylori.   GIVENS CAPSULE STUDY N/A 06/14/2020   Procedure: GIVENS CAPSULE STUDY;  Surgeon: Vinetta Greening, DO;  [facility]: [facility];  Service: Endoscopy;  Laterality: N/A;  7:30am   THYROIDECTOMY     tummy tuck     at time of breast surgery 2003    Social History   Tobacco Use   Smoking status: Never   Smokeless tobacco: Never  Vaping Use   Vaping status: Never Used  Substance Use Topics   Alcohol use: Not Currently   Drug use: Not Currently    Family History  Problem Relation Age of Onset   Diabetes Mother    Thyroid  disease Mother    Colon cancer Neg Hx     Allergies  Allergen Reactions   Codeine Anaphylaxis and Nausea And Vomiting   Carvedilol     Cold sweats, diarrhea, severe hot flashes   Hydrochlorothiazide Other (See Comments)    stomach upset   Lisinopril Nausea And Vomiting     cough   Oxybutynin Other (See Comments)   Penicillins Hives   DG HIP UNILAT WITH PELVIS 2-3 VIEWS LEFT Result Date: 11/12/2023 Left hip imaging Fall left hip Worsening pain left groin left hip X-ray inconclusive if there is a fracture in the hip or not looks EFV around the femoral neck Rule out fracture left hip CT needed Plain films inconclusive     Indeterminate x-ray left hip clinical exam suggest left hip fracture as does history and physical exam  Recommend CT scan left hip rule out fracture Meds ordered this encounter  Medications   HYDROcodone -acetaminophen  (NORCO) 7.5-325 MG tablet    Sig: Take 1 tablet by mouth 2 (two) times daily as needed for moderate pain (pain score 4-6).    Dispense:  30 tablet    Refill:  0    CT scan await results patient advised to continue walker and take medication as noted above

## 2023-11-12 NOTE — Progress Notes (Signed)
  Intake history:  There were no vitals taken for this visit. There is no height or weight on file to calculate BMI.    WHAT ARE WE SEEING YOU FOR TODAY?   left hip(s)  How Stephanie Moreno has this bothered you? (DOI?DOS?WS?)  1 week(s) ago  Anticoag.  No  Diabetes Yes  Heart disease No  Hypertension Yes  SMOKING HX No  Kidney disease Yes  Any ALLERGIES ______________________________________________   Treatment:  Have you taken:  Tylenol  Yes  Advil No  Had PT No  Had injection No  Other  _____hydrocodone____________________

## 2023-11-13 ENCOUNTER — Telehealth: Payer: Self-pay | Admitting: Orthopedic Surgery

## 2023-11-13 NOTE — Telephone Encounter (Signed)
 Dr. Delfino Fellers pt - pt lvm today asking if Dr. Phyllis Breeze has gotten the results for her CT scan from yesterday.  It does show finalized.  [phone]

## 2023-11-14 ENCOUNTER — Ambulatory Visit: Admitting: Orthopaedic Surgery

## 2023-11-14 NOTE — Telephone Encounter (Signed)
 Spoke w/the pt, she is wanting to know Dr. Marvina Slough has seen her results.

## 2023-11-14 NOTE — Telephone Encounter (Signed)
 I called her to advise. She states that would be fine if you call her after 7 she states she is still unable to walk due to the pain.

## 2023-11-15 ENCOUNTER — Telehealth: Payer: Self-pay | Admitting: Orthopedic Surgery

## 2023-11-15 DIAGNOSIS — M48062 Spinal stenosis, lumbar region with neurogenic claudication: Secondary | ICD-10-CM

## 2023-11-15 DIAGNOSIS — M4726 Other spondylosis with radiculopathy, lumbar region: Secondary | ICD-10-CM

## 2023-11-15 DIAGNOSIS — M25552 Pain in left hip: Secondary | ICD-10-CM

## 2023-11-15 DIAGNOSIS — M4317 Spondylolisthesis, lumbosacral region: Secondary | ICD-10-CM

## 2023-11-15 NOTE — Telephone Encounter (Signed)
 Dr. Delfino Fellers pt - pt lvm stating she spoke to Dr. Marvina Slough this morning about PT and he told her he could admit her for PT, but after speaking w/her daughter, she would like to do PT at home.  [phone]

## 2023-11-15 NOTE — Telephone Encounter (Signed)
Called to advise. She voiced understanding

## 2023-11-15 NOTE — Telephone Encounter (Signed)
 78 year old female lives alone fell injured left hip presented days after injury with difficulty ambulating  CT scan ordered  CT scan negative  Patient still having trouble walking My advice was to go to the emergency room get admitted to the hospital for physical therapy and gait assessment and assessment for possible rehab stay  Patient would like to speak with her daughter first

## 2023-11-19 ENCOUNTER — Ambulatory Visit: Payer: 59 | Admitting: Nurse Practitioner

## 2023-11-19 DIAGNOSIS — E782 Mixed hyperlipidemia: Secondary | ICD-10-CM

## 2023-11-19 DIAGNOSIS — Z794 Long term (current) use of insulin: Secondary | ICD-10-CM

## 2023-11-19 DIAGNOSIS — Z7985 Long-term (current) use of injectable non-insulin antidiabetic drugs: Secondary | ICD-10-CM

## 2023-11-19 DIAGNOSIS — I10 Essential (primary) hypertension: Secondary | ICD-10-CM

## 2023-11-19 DIAGNOSIS — N1832 Chronic kidney disease, stage 3b: Secondary | ICD-10-CM

## 2023-11-23 DIAGNOSIS — M2560 Stiffness of unspecified joint, not elsewhere classified: Secondary | ICD-10-CM | POA: Diagnosis not present

## 2023-11-23 DIAGNOSIS — R2689 Other abnormalities of gait and mobility: Secondary | ICD-10-CM | POA: Diagnosis not present

## 2023-11-23 DIAGNOSIS — M6281 Muscle weakness (generalized): Secondary | ICD-10-CM | POA: Diagnosis not present

## 2023-11-23 DIAGNOSIS — M25552 Pain in left hip: Secondary | ICD-10-CM | POA: Diagnosis not present

## 2023-11-23 DIAGNOSIS — M549 Dorsalgia, unspecified: Secondary | ICD-10-CM | POA: Diagnosis not present

## 2023-11-30 ENCOUNTER — Encounter: Payer: Self-pay | Admitting: Emergency Medicine

## 2023-11-30 ENCOUNTER — Ambulatory Visit: Admission: EM | Admit: 2023-11-30 | Discharge: 2023-11-30 | Disposition: A | Discharge status: Home / Self Care

## 2023-11-30 DIAGNOSIS — R296 Repeated falls: Secondary | ICD-10-CM | POA: Diagnosis not present

## 2023-11-30 DIAGNOSIS — M79605 Pain in left leg: Secondary | ICD-10-CM | POA: Diagnosis not present

## 2023-11-30 DIAGNOSIS — I10 Essential (primary) hypertension: Secondary | ICD-10-CM | POA: Diagnosis not present

## 2023-11-30 DIAGNOSIS — Z79899 Other long term (current) drug therapy: Secondary | ICD-10-CM | POA: Diagnosis not present

## 2023-11-30 NOTE — ED Provider Notes (Signed)
 [facility]    CSN: [id number] Arrival date & time: 11/30/23  1620      History   Chief Complaint No chief complaint on file.   HPI Stephanie Moreno is a 78 y.o. female.   Presenting today with ongoing left hip and upper leg pain following a fall 4 weeks ago.  Has been following with orthopedics for this issue, had left hip x-ray and CT earlier this month that were both negative for fracture.  It was recommended that she go to the emergency department on 11/15/2023 to be admitted for inpatient physical therapy given decrease in mobility and ability to do ADLs the patient opted for outpatient therapy.  She had a consultation on Friday with outpatient physical therapy and has plans to start physical therapy on the leg next week.  Denies discoloration, numbness, tingling, new injury.  Taking meloxicam , hydrocodone , Tylenol  with minimal relief.    Past Medical History:  Diagnosis Date   Acid reflux disease    Anemia    Anxiety    Asthma    Breast cancer (HCC)    left side   Chronic back pain    Chronic kidney disease    Depression    Diabetes mellitus without complication (HCC)    Diverticulitis    Hypertension    Lumbosacral spondylosis    Per previous PCP records   Sleep apnea    Thyroid  disease     Patient Active Problem List   Diagnosis Date Noted   Hypokalemia 08/28/2023   Recurrent falls 08/28/2023   Elevated CK 08/28/2023   Obesity, Class I, BMI 30-34.9 08/28/2023   COVID-19 virus infection 08/27/2023   Pain of both hip joints 03/26/2023   Proteinuria 03/25/2023   Pain in both lower legs 12/06/2022   Unsteady gait when walking 12/06/2022   Pain of left hip joint 09/20/2022   Major depressive disorder 09/17/2022   Arthritis 08/21/2022   Cramps of lower extremity 06/20/2022   Lumbago with sciatica 06/20/2022   Nasal congestion 06/20/2022   Dyspnea 04/28/2022   History of UTI 04/12/2022   Abnormal liver function tests 10/06/2021   Insomnia  10/06/2021   Localized edema 10/06/2021   Low back pain 10/06/2021   Minimal cognitive impairment 10/06/2021   Thyroid  nodule 10/06/2021   Malignant neoplasm of female breast (HCC) 10/06/2021   Chronic kidney disease, stage 3a (HCC) 10/05/2021   Hyperglycemia due to type 2 diabetes mellitus (HCC) 10/05/2021   Urge incontinence 07/26/2021   Dehydration    Chronic pain syndrome 03/22/2021   Spondylolisthesis, lumbar region    Spinal stenosis, lumbar region, with neurogenic claudication    Dysphagia 03/18/2021   Speech disturbance 03/18/2021   Fusion of spine of lumbar region 03/15/2021   Cerebrovascular disease 12/20/2020   GERD (gastroesophageal reflux disease) 11/18/2020   Type 2 diabetes mellitus with stage 3b chronic kidney disease, without long-term current use of insulin  (HCC) 07/12/2020   Mixed hyperlipidemia 07/12/2020   Essential hypertension, benign 07/12/2020   Iron  deficiency anemia    Loss of weight 04/09/2020   Dyspepsia 01/28/2020   Constipation 01/28/2020   Lower abdominal pain 01/28/2020   Normocytic anemia 01/28/2020    Past Surgical History:  Procedure Laterality Date   ABDOMINAL HYSTERECTOMY     BACK SURGERY     BALLOON DILATION N/A 04/24/2022   Procedure: BALLOON DILATION;  Surgeon: Vinetta Greening, DO;  [facility]: [facility];  Service: Endoscopy;  Laterality: N/A;   BIOPSY  02/19/2020  Procedure: BIOPSY;  Surgeon: Vinetta Greening, DO;  [facility];  Service: Endoscopy;;   BIOPSY  04/24/2022   Procedure: BIOPSY;  Surgeon: Vinetta Greening, DO;  [facility];  Service: Endoscopy;;   BREAST BIOPSY Right    x 3   BREAST LUMPECTOMY Left 2003   COLONOSCOPY WITH PROPOFOL  N/A 02/19/2020   non-bleeding internal hemorrhoids, many small-mouthed diverticula in entire colon.   ESOPHAGOGASTRODUODENOSCOPY (EGD) WITH PROPOFOL  N/A 02/19/2020    benign-appearing esophageal stenosis s/p dilation, gastritis s/p biopsy, normal duodenum.  Negative H.pylori, negative duodenal biopsy.    ESOPHAGOGASTRODUODENOSCOPY (EGD) WITH PROPOFOL  N/A 04/24/2022   small hiatal hernia, mild Schatzki ring, s/p dilation. Gastritis s/p biopsy. Negative H.pylori.   GIVENS CAPSULE STUDY N/A 06/14/2020   Procedure: GIVENS CAPSULE STUDY;  Surgeon: Vinetta Greening, DO;  [facility];  Service: Endoscopy;  Laterality: N/A;  7:30am   THYROIDECTOMY     tummy tuck     at time of breast surgery 2003    OB History   No obstetric history on file.      Home Medications    Prior to Admission medications   Medication Sig Start Date End Date Taking? Authorizing Provider  acetaminophen  (TYLENOL ) 325 MG tablet Take 650 mg by mouth every 4 (four) hours as needed for mild pain (pain score 1-3).    [provider]  amLODipine  (NORVASC ) 10 MG tablet Take 5 mg by mouth daily.    [provider]  Azelastine HCl 137 MCG/SPRAY SOLN Place 2 sprays into both nostrils 2 (two) times daily. 08/03/23   [provider]  Blood Glucose Monitoring Suppl (ONETOUCH VERIO) w/Device KIT Use to check glucose twice daily, use as backup to CGM 08/21/23   Wendel Hals, NP  Continuous Blood Gluc Sensor (FREESTYLE LIBRE 2 SENSOR) MISC 2 each by Does not apply route every 14 (fourteen) days. 08/04/22   Wendel Hals, NP  Continuous Glucose Receiver (FREESTYLE LIBRE 2 READER) DEVI USE TO CHECK GLUCOSE AS DIRECTED 11/01/22   Baby Bolt, MD  Continuous Glucose Receiver (FREESTYLE LIBRE 3 READER) DEVI Use the Reader to monitor your blood sugar readings daily 01/16/23   Wendel Hals, NP  Continuous Glucose Sensor (FREESTYLE LIBRE 14 DAY SENSOR) MISC APPLY NEW SENSOR EVERY 14 DAYS *NEW PRESCRIPTION REQUEST* 07/10/23   Wendel Hals, NP  Continuous Glucose Sensor (FREESTYLE LIBRE 3 PLUS SENSOR) MISC Change sensor every 15 days. 07/12/23   Wendel Hals, NP  Continuous Glucose Sensor (FREESTYLE LIBRE 3 PLUS SENSOR) MISC Change  sensor every 15 days as directed to monitor blood sugar. 09/26/23   Wendel Hals, NP  Continuous Glucose Sensor (FREESTYLE LIBRE 3 SENSOR) MISC 1 Application by Does not apply route every 14 (fourteen) days. 01/16/23   Wendel Hals, NP  Dulaglutide  (TRULICITY ) 3 MG/0.5ML SOAJ Inject 3 mg into the skin once a week. 10/24/23   Wendel Hals, NP  fluticasone  (FLOVENT  HFA) 220 MCG/ACT inhaler Inhale 1 puff into the lungs 2 (two) times daily. 07/24/22   [provider]  fluticasone  furoate-vilanterol (BREO ELLIPTA ) 100-25 MCG/ACT AEPB Inhale 1 puff into the lungs daily. 08/21/23   Wendel Hals, NP  furosemide  (LASIX ) 20 MG tablet Take 1 tablet (20 mg total) by mouth every other day. Takes 40 mg every other day. On alternate days take 80 mg Patient taking differently: Take 20 mg by mouth 2 (two) times daily. 04/18/21   Lincoln Renshaw,  Clanford L, MD  hydrALAZINE (APRESOLINE) 50 MG tablet Take 50 mg by mouth 2 (two) times daily.    [provider]  HYDROcodone -acetaminophen  (NORCO) 7.5-325 MG tablet Take 1 tablet by mouth 2 (two) times daily as needed for moderate pain (pain score 4-6). 11/12/23   Darrin Emerald, MD  Insulin  Degludec (TRESIBA ) 100 UNIT/ML SOLN Inject 12 Units into the skin at bedtime. 10/06/20   [provider]  Insulin  Pen Needle (BD PEN NEEDLE MICRO U/F) 32G X 6 MM MISC Inject 1 each into the skin at bedtime. 09/12/23   Wendel Hals, NP  Insulin  Pen Needle 31G X 6 MM MISC 1 each by Does not apply route daily. 04/18/23   Wendel Hals, NP  Ipratropium-Albuterol  (COMBIVENT  RESPIMAT) 20-100 MCG/ACT AERS respimat Inhale 1 puff into the lungs every 6 (six) hours as needed. 08/30/23   Mason Sole, Pratik D, DO  levocetirizine (XYZAL ) 5 MG tablet TAKE 1 TABLET BY MOUTH EVERY EVENING AS NEEDED FOR ALLERGIES Patient taking differently: Take 5 mg by mouth every evening. 07/10/23   Delman Ferns, NP  lidocaine  (LIDODERM ) 5 % Place 1 patch onto the skin daily as  needed (pain).    [provider]  losartan  (COZAAR ) 25 MG tablet Take 25 mg by mouth daily.    [provider]  losartan  (COZAAR ) 50 MG tablet Take 50 mg by mouth daily.    [provider]  lubiprostone  (AMITIZA ) 8 MCG capsule Take 1 capsule (8 mcg total) by mouth 2 (two) times daily with a meal. 06/20/23   Delman Ferns, NP  meloxicam  (MOBIC ) 7.5 MG tablet Take 1 tablet (7.5 mg total) by mouth 2 (two) times daily. 07/25/23   Darrin Emerald, MD  mirabegron  ER (MYRBETRIQ ) 50 MG TB24 tablet TAKE 1 TABLET BY MOUTH EVERY DAY AT 9 AM *NEW PRESCRIPTION REQUEST* 07/18/23   McKenzie, Arden Beck, MD  omeprazole  (PRILOSEC) 40 MG capsule Take 1 capsule (40 mg total) by mouth daily before breakfast. 06/20/23   Delman Ferns, NP  ondansetron  (ZOFRAN ) 4 MG tablet Take 1 tablet (4 mg total) by mouth every 8 (eight) hours as needed for nausea or vomiting. 02/21/23   Delman Ferns, NP  OVER THE COUNTER MEDICATION Prevagine one per day for memory.    [provider]  pantoprazole  (PROTONIX ) 40 MG tablet Take 1 tablet (40 mg total) by mouth 2 (two) times daily before a meal. 05/21/23   Delman Ferns, NP  rosuvastatin  (CRESTOR ) 10 MG tablet Take 10 mg by mouth daily. 10/30/22   [provider]  sucralfate  (CARAFATE ) 1 g tablet TAKE 1 TABLET BY MOUTH FOUR TIMES DAILY WITH MEALS AND AT BEDTIME 10/25/23   Delman Ferns, NP    Family History Family History  Problem Relation Age of Onset   Diabetes Mother    Thyroid  disease Mother    Colon cancer Neg Hx     Social History Social History   Tobacco Use   Smoking status: Never   Smokeless tobacco: Never  Vaping Use   Vaping status: Never Used  Substance Use Topics   Alcohol use: Not Currently   Drug use: Not Currently     Allergies   Codeine, Carvedilol, Hydrochlorothiazide, Lisinopril, Oxybutynin, and Penicillins   Review of Systems Review of Systems Per HPI  Physical Exam Triage Vital Signs ED Triage  Vitals  Encounter Vitals Group     BP 11/30/23 1625 (!) 144/83  Systolic BP Percentile --      Diastolic BP Percentile --      Pulse Rate 11/30/23 1625 87     Resp 11/30/23 1625 18     Temp 11/30/23 1625 98.4 F (36.9 C)     Temp Source 11/30/23 1625 Oral     SpO2 11/30/23 1625 94 %     Weight --      Height --      Head Circumference --      Peak Flow --      Pain Score 11/30/23 1626 8     Pain Loc --      Pain Education --      Exclude from Growth Chart --    No data found.  Updated Vital Signs BP (!) 144/83 (BP Location: Left Arm)   Pulse 87   Temp 98.4 F (36.9 C) (Oral)   Resp 18   SpO2 94%   Visual Acuity Right Eye Distance:   Left Eye Distance:   Bilateral Distance:    Right Eye Near:   Left Eye Near:    Bilateral Near:     Physical Exam Vitals and nursing note reviewed.  Constitutional:      Appearance: Normal appearance. She is not ill-appearing.  HENT:     Head: Atraumatic.  Eyes:     Extraocular Movements: Extraocular movements intact.     Conjunctiva/sclera: Conjunctivae normal.  Cardiovascular:     Rate and Rhythm: Normal rate.  Pulmonary:     Effort: Pulmonary effort is normal.  Musculoskeletal:     Cervical back: Normal range of motion and neck supple.     Comments: Currently in wheelchair due to significance of left leg pain  Skin:    General: Skin is warm and dry.  Neurological:     Mental Status: She is alert and oriented to person, place, and time.  Psychiatric:        Mood and Affect: Mood normal.        Thought Content: Thought content normal.        Judgment: Judgment normal.      UC Treatments / Results  Labs (all labs ordered are listed, but only abnormal results are displayed) Labs Reviewed - No data to display  EKG   Radiology No results found.  Procedures Procedures (including critical care time)  Medications Ordered in UC Medications - No data to display  Initial Impression / Assessment and Plan / UC  Course  I have reviewed the triage vital signs and the nursing notes.  Pertinent labs & imaging results that were available during my care of the patient were reviewed by me and considered in my medical decision making (see chart for details).     Ongoing issue, being followed by physical therapy and orthopedics.  Already taking NSAIDs, opioids, Tylenol .  Discussed close orthopedic follow-up, continued work with physical therapy and continued current regimen.  No red flag findings today.  Return for any worsening symptoms.  Final Clinical Impressions(s) / UC Diagnoses   Final diagnoses:  Left leg pain     Discharge Instructions      Continue meloxicam , Tylenol  as needed, physical therapy and close follow-up with orthopedist.  ED Prescriptions   None    PDMP not reviewed this encounter.   Corbin Dess, [place] 11/30/23 1734

## 2023-11-30 NOTE — Discharge Instructions (Signed)
 Continue meloxicam , Tylenol  as needed, physical therapy and close follow-up with orthopedist.

## 2023-11-30 NOTE — ED Triage Notes (Signed)
 Fell 4 weeks ago.  Was seen by ortho and had x-ray and CT.  C/o left hip pain that runs down left leg

## 2023-12-05 DIAGNOSIS — M25652 Stiffness of left hip, not elsewhere classified: Secondary | ICD-10-CM | POA: Diagnosis not present

## 2023-12-05 DIAGNOSIS — R2689 Other abnormalities of gait and mobility: Secondary | ICD-10-CM | POA: Diagnosis not present

## 2023-12-05 DIAGNOSIS — M549 Dorsalgia, unspecified: Secondary | ICD-10-CM | POA: Diagnosis not present

## 2023-12-05 DIAGNOSIS — M25552 Pain in left hip: Secondary | ICD-10-CM | POA: Diagnosis not present

## 2023-12-05 DIAGNOSIS — M6281 Muscle weakness (generalized): Secondary | ICD-10-CM | POA: Diagnosis not present

## 2023-12-05 DIAGNOSIS — M2560 Stiffness of unspecified joint, not elsewhere classified: Secondary | ICD-10-CM | POA: Diagnosis not present

## 2023-12-06 DIAGNOSIS — M25552 Pain in left hip: Secondary | ICD-10-CM | POA: Diagnosis not present

## 2023-12-06 DIAGNOSIS — R2689 Other abnormalities of gait and mobility: Secondary | ICD-10-CM | POA: Diagnosis not present

## 2023-12-06 DIAGNOSIS — M6281 Muscle weakness (generalized): Secondary | ICD-10-CM | POA: Diagnosis not present

## 2023-12-06 DIAGNOSIS — M549 Dorsalgia, unspecified: Secondary | ICD-10-CM | POA: Diagnosis not present

## 2023-12-06 DIAGNOSIS — M25652 Stiffness of left hip, not elsewhere classified: Secondary | ICD-10-CM | POA: Diagnosis not present

## 2023-12-06 DIAGNOSIS — M2560 Stiffness of unspecified joint, not elsewhere classified: Secondary | ICD-10-CM | POA: Diagnosis not present

## 2023-12-11 DIAGNOSIS — E1165 Type 2 diabetes mellitus with hyperglycemia: Secondary | ICD-10-CM | POA: Diagnosis not present

## 2023-12-11 DIAGNOSIS — M25552 Pain in left hip: Secondary | ICD-10-CM | POA: Diagnosis not present

## 2023-12-11 DIAGNOSIS — E782 Mixed hyperlipidemia: Secondary | ICD-10-CM | POA: Diagnosis not present

## 2023-12-11 DIAGNOSIS — M6281 Muscle weakness (generalized): Secondary | ICD-10-CM | POA: Diagnosis not present

## 2023-12-11 DIAGNOSIS — M2560 Stiffness of unspecified joint, not elsewhere classified: Secondary | ICD-10-CM | POA: Diagnosis not present

## 2023-12-11 DIAGNOSIS — M549 Dorsalgia, unspecified: Secondary | ICD-10-CM | POA: Diagnosis not present

## 2023-12-11 DIAGNOSIS — I10 Essential (primary) hypertension: Secondary | ICD-10-CM | POA: Diagnosis not present

## 2023-12-11 DIAGNOSIS — N1831 Chronic kidney disease, stage 3a: Secondary | ICD-10-CM | POA: Diagnosis not present

## 2023-12-11 DIAGNOSIS — M25652 Stiffness of left hip, not elsewhere classified: Secondary | ICD-10-CM | POA: Diagnosis not present

## 2023-12-11 DIAGNOSIS — R2689 Other abnormalities of gait and mobility: Secondary | ICD-10-CM | POA: Diagnosis not present

## 2023-12-14 DIAGNOSIS — R2689 Other abnormalities of gait and mobility: Secondary | ICD-10-CM | POA: Diagnosis not present

## 2023-12-14 DIAGNOSIS — M2560 Stiffness of unspecified joint, not elsewhere classified: Secondary | ICD-10-CM | POA: Diagnosis not present

## 2023-12-14 DIAGNOSIS — M25552 Pain in left hip: Secondary | ICD-10-CM | POA: Diagnosis not present

## 2023-12-14 DIAGNOSIS — M25652 Stiffness of left hip, not elsewhere classified: Secondary | ICD-10-CM | POA: Diagnosis not present

## 2023-12-14 DIAGNOSIS — M6281 Muscle weakness (generalized): Secondary | ICD-10-CM | POA: Diagnosis not present

## 2023-12-14 DIAGNOSIS — M549 Dorsalgia, unspecified: Secondary | ICD-10-CM | POA: Diagnosis not present

## 2023-12-17 ENCOUNTER — Ambulatory Visit: Admitting: Orthopedic Surgery

## 2023-12-17 ENCOUNTER — Other Ambulatory Visit: Payer: Self-pay | Admitting: Urology

## 2023-12-17 ENCOUNTER — Other Ambulatory Visit: Payer: Self-pay | Admitting: Nurse Practitioner

## 2023-12-17 ENCOUNTER — Other Ambulatory Visit: Payer: Self-pay | Admitting: Gastroenterology

## 2023-12-17 DIAGNOSIS — Z7984 Long term (current) use of oral hypoglycemic drugs: Secondary | ICD-10-CM

## 2023-12-17 DIAGNOSIS — Z794 Long term (current) use of insulin: Secondary | ICD-10-CM

## 2023-12-17 DIAGNOSIS — N3941 Urge incontinence: Secondary | ICD-10-CM

## 2023-12-17 DIAGNOSIS — E1122 Type 2 diabetes mellitus with diabetic chronic kidney disease: Secondary | ICD-10-CM

## 2023-12-17 DIAGNOSIS — Z7985 Long-term (current) use of injectable non-insulin antidiabetic drugs: Secondary | ICD-10-CM

## 2023-12-18 ENCOUNTER — Telehealth: Payer: Self-pay | Admitting: *Deleted

## 2023-12-18 DIAGNOSIS — M25552 Pain in left hip: Secondary | ICD-10-CM | POA: Diagnosis not present

## 2023-12-18 DIAGNOSIS — M25652 Stiffness of left hip, not elsewhere classified: Secondary | ICD-10-CM | POA: Diagnosis not present

## 2023-12-18 DIAGNOSIS — M6281 Muscle weakness (generalized): Secondary | ICD-10-CM | POA: Diagnosis not present

## 2023-12-18 DIAGNOSIS — M2560 Stiffness of unspecified joint, not elsewhere classified: Secondary | ICD-10-CM | POA: Diagnosis not present

## 2023-12-18 DIAGNOSIS — M549 Dorsalgia, unspecified: Secondary | ICD-10-CM | POA: Diagnosis not present

## 2023-12-18 DIAGNOSIS — R2689 Other abnormalities of gait and mobility: Secondary | ICD-10-CM | POA: Diagnosis not present

## 2023-12-18 NOTE — Telephone Encounter (Signed)
 The patient left a message on 12/17/2023. Requesting that her Amlodipine  be refilled. After reviewing her last office visit note, she is taking the Losartan  25 mg daily. Patient was called and a voicemail left sharing this with her. Ask patient to contact her PCP.

## 2023-12-19 ENCOUNTER — Ambulatory Visit: Payer: Medicare HMO | Admitting: Gastroenterology

## 2023-12-19 DIAGNOSIS — J449 Chronic obstructive pulmonary disease, unspecified: Secondary | ICD-10-CM | POA: Diagnosis not present

## 2023-12-19 DIAGNOSIS — E782 Mixed hyperlipidemia: Secondary | ICD-10-CM | POA: Diagnosis not present

## 2023-12-19 DIAGNOSIS — E1165 Type 2 diabetes mellitus with hyperglycemia: Secondary | ICD-10-CM | POA: Diagnosis not present

## 2023-12-19 DIAGNOSIS — I10 Essential (primary) hypertension: Secondary | ICD-10-CM | POA: Diagnosis not present

## 2023-12-20 ENCOUNTER — Encounter: Payer: Self-pay | Admitting: Gastroenterology

## 2023-12-20 DIAGNOSIS — K219 Gastro-esophageal reflux disease without esophagitis: Secondary | ICD-10-CM | POA: Diagnosis not present

## 2023-12-20 DIAGNOSIS — E782 Mixed hyperlipidemia: Secondary | ICD-10-CM | POA: Diagnosis not present

## 2023-12-20 DIAGNOSIS — M25552 Pain in left hip: Secondary | ICD-10-CM | POA: Diagnosis not present

## 2023-12-20 DIAGNOSIS — I10 Essential (primary) hypertension: Secondary | ICD-10-CM | POA: Diagnosis not present

## 2023-12-20 DIAGNOSIS — E1165 Type 2 diabetes mellitus with hyperglycemia: Secondary | ICD-10-CM | POA: Diagnosis not present

## 2023-12-20 DIAGNOSIS — J309 Allergic rhinitis, unspecified: Secondary | ICD-10-CM | POA: Diagnosis not present

## 2023-12-20 DIAGNOSIS — D638 Anemia in other chronic diseases classified elsewhere: Secondary | ICD-10-CM | POA: Diagnosis not present

## 2023-12-20 DIAGNOSIS — K5909 Other constipation: Secondary | ICD-10-CM | POA: Diagnosis not present

## 2023-12-20 DIAGNOSIS — M5416 Radiculopathy, lumbar region: Secondary | ICD-10-CM | POA: Diagnosis not present

## 2023-12-20 DIAGNOSIS — N1831 Chronic kidney disease, stage 3a: Secondary | ICD-10-CM | POA: Diagnosis not present

## 2023-12-20 DIAGNOSIS — M1990 Unspecified osteoarthritis, unspecified site: Secondary | ICD-10-CM | POA: Diagnosis not present

## 2023-12-20 DIAGNOSIS — G629 Polyneuropathy, unspecified: Secondary | ICD-10-CM | POA: Insufficient documentation

## 2023-12-31 ENCOUNTER — Ambulatory Visit (INDEPENDENT_AMBULATORY_CARE_PROVIDER_SITE_OTHER): Admitting: Orthopedic Surgery

## 2023-12-31 ENCOUNTER — Encounter: Payer: Self-pay | Admitting: Orthopedic Surgery

## 2023-12-31 ENCOUNTER — Telehealth: Payer: Self-pay

## 2023-12-31 DIAGNOSIS — M48062 Spinal stenosis, lumbar region with neurogenic claudication: Secondary | ICD-10-CM | POA: Diagnosis not present

## 2023-12-31 NOTE — Patient Instructions (Signed)
 Call to schedule appointment with the back doctor  [phone]

## 2023-12-31 NOTE — Progress Notes (Signed)
   Chief Complaint  Patient presents with   Difficulty Walking    States has trouble walking   Leg Pain    left thigh / and back painful     78 year old female with continued pain in her left thigh and lower back.  She is status post lumbar fusion done elsewhere.  She had a fall we worked up with x-rays and CT scan there was no fracture  Patient complains of worsening weakness in the left lower extremity with a feeling that the leg will collapse  Again she has had a CT scan and x-ray of her left hip with no acute findings  Her exam shows tenderness in the lower back right middle and left side into the buttock with weakness of the dorsiflexors of her left foot and normal range of motion of the left hip   Encounter Diagnosis  Name Primary?   Spinal stenosis of lumbar region with neurogenic claudication Yes   No results found.  X-ray inconclusive if there is a fracture in the hip or not looks EFV around the femoral neck   Rule out fracture left hip CT needed   Plain films inconclusive The left hip and pelvic musculature appear grossly normal by CT. No obvious muscle tear or intramuscular hematoma.   No significant left-sided intrapelvic abnormalities. No left inguinal mass or hernia. Moderate atherosclerotic calcifications involving the major arterial structures. No aneurysm.   IMPRESSION: 1. No acute bony findings. 2. Mild degenerative changes for age. 3. No obvious muscle tear or intramuscular hematoma.     Electronically Signed   By: MYRTIS Stammer M.D.   On: 11/12/2023 17:07   We have advised her again to see her spine surgeon to address the left lower leg weakness and dorsiflexion weakness

## 2023-12-31 NOTE — Telephone Encounter (Signed)
 Patient left message stating that she has no transportation to get to [facility] to see the back doctor and she wants you to send a referral to her insurance company([employer])stating that it is important for her to get to [facility] and see the back doctor so maybe they will help her get transportation.

## 2023-12-31 NOTE — Progress Notes (Signed)
   There were no vitals taken for this visit.  There is no height or weight on file to calculate BMI.  Chief Complaint  Patient presents with   Difficulty Walking    States has trouble walking   Leg Pain    left thigh / and back painful     No diagnosis found.  DOI/DOS/ Date: ongoing / states she fell again unknown when   Unchanged

## 2024-01-01 DIAGNOSIS — M2560 Stiffness of unspecified joint, not elsewhere classified: Secondary | ICD-10-CM | POA: Diagnosis not present

## 2024-01-01 DIAGNOSIS — M549 Dorsalgia, unspecified: Secondary | ICD-10-CM | POA: Diagnosis not present

## 2024-01-01 DIAGNOSIS — M25652 Stiffness of left hip, not elsewhere classified: Secondary | ICD-10-CM | POA: Diagnosis not present

## 2024-01-01 DIAGNOSIS — I10 Essential (primary) hypertension: Secondary | ICD-10-CM | POA: Diagnosis not present

## 2024-01-01 DIAGNOSIS — J449 Chronic obstructive pulmonary disease, unspecified: Secondary | ICD-10-CM | POA: Diagnosis not present

## 2024-01-01 DIAGNOSIS — E782 Mixed hyperlipidemia: Secondary | ICD-10-CM | POA: Diagnosis not present

## 2024-01-01 DIAGNOSIS — E1165 Type 2 diabetes mellitus with hyperglycemia: Secondary | ICD-10-CM | POA: Diagnosis not present

## 2024-01-01 DIAGNOSIS — M6281 Muscle weakness (generalized): Secondary | ICD-10-CM | POA: Diagnosis not present

## 2024-01-01 DIAGNOSIS — M25552 Pain in left hip: Secondary | ICD-10-CM | POA: Diagnosis not present

## 2024-01-01 DIAGNOSIS — R2689 Other abnormalities of gait and mobility: Secondary | ICD-10-CM | POA: Diagnosis not present

## 2024-01-03 DIAGNOSIS — M2560 Stiffness of unspecified joint, not elsewhere classified: Secondary | ICD-10-CM | POA: Diagnosis not present

## 2024-01-03 DIAGNOSIS — R2689 Other abnormalities of gait and mobility: Secondary | ICD-10-CM | POA: Diagnosis not present

## 2024-01-03 DIAGNOSIS — M549 Dorsalgia, unspecified: Secondary | ICD-10-CM | POA: Diagnosis not present

## 2024-01-03 DIAGNOSIS — M25552 Pain in left hip: Secondary | ICD-10-CM | POA: Diagnosis not present

## 2024-01-03 DIAGNOSIS — M6281 Muscle weakness (generalized): Secondary | ICD-10-CM | POA: Diagnosis not present

## 2024-01-03 DIAGNOSIS — M25652 Stiffness of left hip, not elsewhere classified: Secondary | ICD-10-CM | POA: Diagnosis not present

## 2024-01-07 NOTE — Telephone Encounter (Signed)
 Spoke with patient and she said that she will go see Dr. Georgina in [place] for her back if we provide transportation. I told her that we would pay for transportation for the first visit. We now need to send referral to Dr. Jeraline office and get her scheduled so we can work on the transportation for the patient.

## 2024-01-09 ENCOUNTER — Ambulatory Visit (INDEPENDENT_AMBULATORY_CARE_PROVIDER_SITE_OTHER): Admitting: Nurse Practitioner

## 2024-01-09 ENCOUNTER — Encounter: Payer: Self-pay | Admitting: Nurse Practitioner

## 2024-01-09 VITALS — BP 128/78 | HR 94 | Ht 63.0 in | Wt 167.4 lb

## 2024-01-09 DIAGNOSIS — E1122 Type 2 diabetes mellitus with diabetic chronic kidney disease: Secondary | ICD-10-CM | POA: Diagnosis not present

## 2024-01-09 DIAGNOSIS — Z7985 Long-term (current) use of injectable non-insulin antidiabetic drugs: Secondary | ICD-10-CM | POA: Diagnosis not present

## 2024-01-09 DIAGNOSIS — I10 Essential (primary) hypertension: Secondary | ICD-10-CM

## 2024-01-09 DIAGNOSIS — Z7984 Long term (current) use of oral hypoglycemic drugs: Secondary | ICD-10-CM | POA: Diagnosis not present

## 2024-01-09 DIAGNOSIS — N1832 Chronic kidney disease, stage 3b: Secondary | ICD-10-CM | POA: Diagnosis not present

## 2024-01-09 DIAGNOSIS — E782 Mixed hyperlipidemia: Secondary | ICD-10-CM

## 2024-01-09 DIAGNOSIS — Z794 Long term (current) use of insulin: Secondary | ICD-10-CM

## 2024-01-09 LAB — POCT GLYCOSYLATED HEMOGLOBIN (HGB A1C): Hemoglobin A1C: 6.8 % — AB (ref 4.0–5.6)

## 2024-01-09 MED ORDER — FREESTYLE LIBRE 3 PLUS SENSOR MISC: 1 refills | Status: AC

## 2024-01-09 NOTE — Progress Notes (Signed)
 01/09/2024, 2:33 PM  Endocrinology follow-up note   Subjective:    Patient ID: Stephanie Moreno, female    DOB: 1946-04-06.  Stephanie Moreno is being seen in follow-up after she was seen in consultation for management of currently uncontrolled symptomatic diabetes requested by  [name] [name] PEDLAR, MD.   Past Medical History:  Diagnosis Date   Acid reflux disease    Anemia    Anxiety    Asthma    Breast cancer (HCC)    left side   Chronic back pain    Chronic kidney disease    Depression    Diabetes mellitus without complication (HCC)    Diverticulitis    Hypertension    Lumbosacral spondylosis    Per previous PCP records   Sleep apnea    Thyroid  disease     Past Surgical History:  Procedure Laterality Date   ABDOMINAL HYSTERECTOMY     BACK SURGERY     BALLOON DILATION N/A 04/24/2022   Procedure: BALLOON DILATION;  Surgeon: Cindie Carlin POUR, DO;  Location: [facility];  Service: Endoscopy;  Laterality: N/A;   BIOPSY  02/19/2020   Procedure: BIOPSY;  Surgeon: Cindie Carlin POUR, DO;  [facility]: [facility];  Service: Endoscopy;;   BIOPSY  04/24/2022   Procedure: BIOPSY;  Surgeon: Cindie Carlin POUR, DO;  Location: [facility];  Service: Endoscopy;;   BREAST BIOPSY Right    x 3   BREAST LUMPECTOMY Left 2003   COLONOSCOPY WITH PROPOFOL  N/A 02/19/2020   non-bleeding internal hemorrhoids, many small-mouthed diverticula in entire colon.   ESOPHAGOGASTRODUODENOSCOPY (EGD) WITH PROPOFOL  N/A 02/19/2020    benign-appearing esophageal stenosis s/p dilation, gastritis s/p biopsy, normal duodenum. Negative H.pylori, negative duodenal biopsy.    ESOPHAGOGASTRODUODENOSCOPY (EGD) WITH PROPOFOL  N/A 04/24/2022   small hiatal hernia, mild Schatzki ring, s/p dilation. Gastritis s/p biopsy. Negative H.pylori.   GIVENS CAPSULE STUDY N/A 06/14/2020   Procedure: GIVENS CAPSULE STUDY;  Surgeon: Cindie Carlin POUR, DO;  Location: [facility];  Service: Endoscopy;   Laterality: N/A;  7:30am   THYROIDECTOMY     tummy tuck     at time of breast surgery 2003    Social History   Socioeconomic History   Marital status: Divorced    Spouse name: Not on file   Number of children: Not on file   Years of education: Not on file   Highest education level: Not on file  Occupational History   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 [place]   Tobacco Use   Smoking status: Never   Smokeless tobacco: Never  Vaping Use   Vaping status: Never Used  Substance and Sexual Activity   Alcohol use: Not Currently   Drug use: Not Currently   Sexual activity: Not on file  Other Topics Concern   Not on file  Social History Narrative   Not on file   Social Drivers of Health   Financial Resource Strain: Not on file  Food Insecurity: No Food Insecurity (08/31/2023)   Hunger Vital Sign    Worried About Running Out of Food in the Last Year: Never true    Ran Out of Food in the Last Year: Never true  Transportation Needs: No Transportation Needs (08/31/2023)   PRAPARE - Transportation    Lack of Transportation (Medical): No    Lack of Transportation (Non-Medical): No  Recent Concern: Transportation Needs - Unmet Transportation Needs (  08/28/2023)   PRAPARE - Administrator, Civil Service (Medical): Yes    Lack of Transportation (Non-Medical): No  Physical Activity: Not on file  Stress: Not on file  Social Connections: Moderately Integrated (08/28/2023)   Social Connection and Isolation Panel    Frequency of Communication with Friends and Family: Three times a week    Frequency of Social Gatherings with Friends and Family: Once a week    Attends Religious Services: More than 4 times per year    Active Member of [employer] or Organizations: No    Attends Engineer, structural: More than 4 times per year    Marital Status: Divorced    Family History  Problem Relation Age of Onset   Diabetes Mother    Thyroid  disease Mother     Colon cancer Neg Hx     Outpatient Encounter Medications as of 01/09/2024  Medication Sig   acetaminophen  (TYLENOL ) 325 MG tablet Take 650 mg by mouth every 4 (four) hours as needed for mild pain (pain score 1-3).   amLODipine  (NORVASC ) 10 MG tablet Take 5 mg by mouth daily.   Azelastine HCl 137 MCG/SPRAY SOLN Place 2 sprays into both nostrils 2 (two) times daily.   Blood Glucose Monitoring Suppl (ONETOUCH VERIO) w/Device KIT Use to check glucose twice daily, use as backup to CGM   Continuous Glucose Receiver (FREESTYLE LIBRE 3 READER) DEVI Use the Reader to monitor your blood sugar readings daily   Continuous Glucose Sensor (FREESTYLE LIBRE 3 PLUS SENSOR) MISC Change sensor every 15 days.   fluticasone  (FLOVENT  HFA) 220 MCG/ACT inhaler Inhale 1 puff into the lungs 2 (two) times daily.   fluticasone  furoate-vilanterol (BREO ELLIPTA ) 100-25 MCG/ACT AEPB Inhale 1 puff into the lungs daily.   furosemide  (LASIX ) 20 MG tablet Take 1 tablet (20 mg total) by mouth every other day. Takes 40 mg every other day. On alternate days take 80 mg (Patient taking differently: Take 20 mg by mouth 2 (two) times daily.)   hydrALAZINE (APRESOLINE) 50 MG tablet Take 50 mg by mouth 2 (two) times daily.   HYDROcodone -acetaminophen  (NORCO) 7.5-325 MG tablet Take 1 tablet by mouth 2 (two) times daily as needed for moderate pain (pain score 4-6).   Insulin  Degludec (TRESIBA ) 100 UNIT/ML SOLN Inject 12 Units into the skin at bedtime.   Insulin  Pen Needle (BD PEN NEEDLE MICRO U/F) 32G X 6 MM MISC Inject 1 each into the skin at bedtime.   Insulin  Pen Needle 31G X 6 MM MISC 1 each by Does not apply route daily.   Ipratropium-Albuterol  (COMBIVENT  RESPIMAT) 20-100 MCG/ACT AERS respimat Inhale 1 puff into the lungs every 6 (six) hours as needed.   levocetirizine (XYZAL ) 5 MG tablet TAKE 1 TABLET BY MOUTH EVERY EVENING AS NEEDED FOR ALLERGIES (Patient taking differently: Take 5 mg by mouth every evening.)   lidocaine  (LIDODERM ) 5 %  Place 1 patch onto the skin daily as needed (pain).   losartan  (COZAAR ) 50 MG tablet Take 50 mg by mouth daily.   lubiprostone  (AMITIZA ) 8 MCG capsule TAKE 1 CAPSULE (8 MCG TOTAL) BY MOUTH 2 (TWO) TIMES DAILY WITH A MEAL.   meloxicam  (MOBIC ) 7.5 MG tablet Take 1 tablet (7.5 mg total) by mouth 2 (two) times daily.   MYRBETRIQ  50 MG TB24 tablet TAKE 1 TABLET BY MOUTH EVERY DAY AT 9AM   omeprazole  (PRILOSEC) 40 MG capsule Take 1 capsule (40 mg total) by mouth daily before breakfast.   ondansetron  (ZOFRAN )  4 MG tablet Take 1 tablet (4 mg total) by mouth every 8 (eight) hours as needed for nausea or vomiting.   OVER THE COUNTER MEDICATION Prevagine one per day for memory.   pantoprazole  (PROTONIX ) 40 MG tablet TAKE 1 TABLET (40 MG TOTAL) BY MOUTH TWICE A DAY BEFORE MEALS   rosuvastatin  (CRESTOR ) 10 MG tablet Take 10 mg by mouth daily.   sucralfate  (CARAFATE ) 1 g tablet TAKE 1 TABLET BY MOUTH FOUR TIMES DAILY WITH MEALS AND AT BEDTIME   [DISCONTINUED] Continuous Glucose Sensor (FREESTYLE LIBRE 3 PLUS SENSOR) MISC Change sensor every 15 days as directed to monitor blood sugar.   [DISCONTINUED] Continuous Glucose Sensor (FREESTYLE LIBRE 3 SENSOR) MISC 1 Application by Does not apply route every 14 (fourteen) days.   [DISCONTINUED] TRULICITY  3 MG/0.5ML SOAJ INJECT 3 MG INTO THE SKIN ONE TIME PER WEEK   Continuous Glucose Receiver (FREESTYLE LIBRE 2 READER) DEVI USE TO CHECK GLUCOSE AS DIRECTED (Patient not taking: Reported on 01/09/2024)   Continuous Glucose Sensor (FREESTYLE LIBRE 3 PLUS SENSOR) MISC Change sensor every 15 days as directed to monitor blood sugar.   losartan  (COZAAR ) 25 MG tablet Take 25 mg by mouth daily. (Patient not taking: Reported on 01/09/2024)   [DISCONTINUED] Continuous Blood Gluc Sensor (FREESTYLE LIBRE 2 SENSOR) MISC 2 each by Does not apply route every 14 (fourteen) days. (Patient not taking: Reported on 01/09/2024)   [DISCONTINUED] Continuous Glucose Sensor (FREESTYLE LIBRE 14 DAY  SENSOR) MISC APPLY NEW SENSOR EVERY 14 DAYS *NEW PRESCRIPTION REQUEST* (Patient not taking: Reported on 01/09/2024)   No facility-administered encounter medications on file as of 01/09/2024.    ALLERGIES: Allergies  Allergen Reactions   Codeine Anaphylaxis and Nausea And Vomiting   Carvedilol     Cold sweats, diarrhea, severe hot flashes   Hydrochlorothiazide Other (See Comments)    stomach upset   Lisinopril Nausea And Vomiting    cough   Oxybutynin Other (See Comments)   Penicillins Hives    VACCINATION STATUS: Immunization History  Administered Date(s) Administered   Fluad Quad(high Dose 65+) 03/17/2021   Pneumococcal Conjugate-13 09/02/2014   Pneumococcal Polysaccharide-23 07/03/2010   Tdap 07/03/2000    Diabetes She presents for her follow-up diabetic visit. She has type 2 diabetes mellitus. Onset time: Diagnosed at approx age of 42. Her disease course has been stable. There are no hypoglycemic associated symptoms. Associated symptoms include fatigue and foot paresthesias. Pertinent negatives for diabetes include no polydipsia, no polyuria and no weight loss. There are no hypoglycemic complications. Symptoms are stable. Diabetic complications include a CVA, nephropathy and peripheral neuropathy. Risk factors for coronary artery disease include diabetes mellitus, dyslipidemia, hypertension, post-menopausal and sedentary lifestyle. Current diabetic treatment includes insulin  injections (and Trulicity ). She is compliant with treatment most of the time. Her weight is fluctuating minimally. She is following a generally healthy diet. When asked about meal planning, she reported none. She has not had a previous visit with a dietitian. She rarely participates in exercise. Her home blood glucose trend is fluctuating minimally. Her overall blood glucose range is 140-180 mg/dl. (She presents today with her CGM showing at goal glycemic profile overall.  Her POCT A1c today is 6.8%, essentially  unchanged from previous visit of 6.7%. She notes recent epigastric pain and constipation/diarrhea. Analysis of her CGM shows TIR 76%, TAR 24%, TBR 0%.) An ACE inhibitor/angiotensin II receptor blocker is being taken. She does not see a podiatrist.Eye exam is current.  Hypertension This is a chronic problem. The current  episode started more than 1 year ago. The problem is unchanged. The problem is controlled. Associated symptoms include peripheral edema. Agents associated with hypertension include thyroid  hormones. Risk factors for coronary artery disease include diabetes mellitus, dyslipidemia, post-menopausal state and sedentary lifestyle. Past treatments include diuretics, beta blockers, ACE inhibitors and angiotensin blockers. There are no compliance problems.  Hypertensive end-organ damage includes kidney disease and CVA. Identifiable causes of hypertension include chronic renal disease, sleep apnea and a thyroid  problem.  Hyperlipidemia This is a chronic problem. The current episode started more than 1 year ago. The problem is controlled. Recent lipid tests were reviewed and are normal. Exacerbating diseases include chronic renal disease and hypothyroidism. Factors aggravating her hyperlipidemia include beta blockers. Current antihyperlipidemic treatment includes statins. The current treatment provides moderate improvement of lipids. There are no compliance problems.  Risk factors for coronary artery disease include diabetes mellitus, dyslipidemia, hypertension, obesity, post-menopausal and a sedentary lifestyle.     Review of systems  Constitutional: + stable body weight,  current Body mass index is 29.65 kg/m. , + fatigue, no subjective hyperthermia, no subjective hypothermia Eyes: no blurry vision, no xerophthalmia ENT: no sore throat, no nodules palpated in throat, no dysphagia/odynophagia, no hoarseness Cardiovascular: no chest pain, no shortness of breath, no palpitations, + ankle  swelling Respiratory: no cough, no shortness of breath Gastrointestinal: no nausea/vomiting, + alternating constipation/diarrhea (seeing GI soon), +epigastric pain Musculoskeletal: ongoing back pain-will be getting injections soon, walks with walker due to previous CVA and disequilibrium- has frequent falls Skin: no rashes, no hyperemia Neurological: no tremors, + intermittent dizziness, + numbness/tingling to BLE (on gabapentin ) Psychiatric: no depression, no anxiety  Objective:     BP 128/78 (BP Location: Right Arm, Patient Position: Sitting, Cuff Size: Large)   Pulse 94   Ht 5' 3 (1.6 m)   Wt 167 lb 6.4 oz (75.9 kg)   BMI 29.65 kg/m   Wt Readings from Last 3 Encounters:  01/09/24 167 lb 6.4 oz (75.9 kg)  10/31/23 169 lb (76.7 kg)  08/28/23 169 lb 5 oz (76.8 kg)    BP Readings from Last 3 Encounters:  01/09/24 128/78  11/30/23 (!) 144/83  11/12/23 (!) 152/94     Physical Exam- Limited  Constitutional:  Body mass index is 29.65 kg/m. , not in acute distress, normal state of mind Eyes:  EOMI, no exophthalmos Musculoskeletal: no gross deformities,  walks with walker due to disequilibrium and frequent falls Skin:  no rashes, no hyperemia Neurological: no tremor with outstretched hands   Diabetic Foot Exam - Simple   No data filed     CMP ( most recent) CMP     Component Value Date/Time   NA 135 08/30/2023 0330   NA 141 07/25/2022 1232   K 3.7 08/30/2023 0330   CL 106 08/30/2023 0330   CO2 20 (L) 08/30/2023 0330   GLUCOSE 90 08/30/2023 0330   BUN 15 08/30/2023 0330   BUN 16 07/25/2022 1232   CREATININE 1.23 (H) 08/30/2023 0330   CREATININE 1.42 (H) 10/12/2020 1009   CALCIUM  8.6 (L) 08/30/2023 0330   PROT 7.5 08/28/2023 0520   PROT 8.1 07/25/2022 1232   ALBUMIN  3.6 08/28/2023 0520   ALBUMIN  4.5 07/25/2022 1232   AST 33 08/28/2023 0520   ALT 20 08/28/2023 0520   ALKPHOS 51 08/28/2023 0520   BILITOT 0.7 08/28/2023 0520   BILITOT 0.3 07/25/2022 1232    GFRNONAA 45 (L) 08/30/2023 0330   GFRNONAA 36 (L) 10/12/2020 1009  GFRAA 42 (L) 10/12/2020 1009     Diabetic Labs (most recent): Lab Results  Component Value Date   HGBA1C 6.8 (A) 01/09/2024   HGBA1C 6.7 (H) 08/28/2023   HGBA1C 7.7 (A) 01/15/2023   MICROALBUR 80 10/26/2020     Lipid Panel ( most recent) Lipid Panel     Component Value Date/Time   CHOL 91 04/19/2021 0000   TRIG 78 04/19/2021 0000   HDL 42 04/19/2021 0000   CHOLHDL 2.0 10/12/2020 1009   LDLCALC 34 04/19/2021 0000   LDLCALC 46 10/12/2020 1009      Lab Results  Component Value Date   TSH 3.94 10/12/2020   TSH 1.18 03/09/2020      Assessment & Plan:   1) Type 2 diabetes mellitus with stage 3b chronic kidney disease, without long-term current use of insulin  (HCC)  - Stephanie Moreno has currently uncontrolled symptomatic type 2 DM since  78 years of age.  She presents today with her CGM (no new data since January 1st), says her receiver broke, she did call to get a replacement shipped but has not yet received it.  Her most recent A1c on 07/24/23 was 6.8%, improving from last visit of 7.7%.  She does not have a back up meter at home to check glucose.   Recent labs reviewed.    - I had a long discussion with her about the progressive nature of diabetes and the pathology behind its complications. -her diabetes is complicated by CKD, CVA and she remains at a high risk for more acute and chronic complications which include CAD, CVA, CKD, retinopathy, and neuropathy. These are all discussed in detail with her.  - Nutritional counseling repeated at each appointment due to patients tendency to fall back in to old habits.  - The patient admits there is a room for improvement in their diet and drink choices. -  Suggestion is made for the patient to avoid simple carbohydrates from their diet including Cakes, Sweet Desserts / Pastries, Ice Cream, Soda (diet and regular), Sweet Tea, Candies, Chips, Cookies, Sweet  Pastries, Store Bought Juices, Alcohol in Excess of 1-2 drinks a day, Artificial Sweeteners, Coffee Creamer, and Sugar-free Products. This will help patient to have stable blood glucose profile and potentially avoid unintended weight gain.   - I encouraged the patient to switch to unprocessed or minimally processed complex starch and increased protein intake (animal or plant source), fruits, and vegetables.   - Patient is advised to stick to a routine mealtimes to eat 3 meals a day and avoid unnecessary snacks (to snack only to correct hypoglycemia).  - I have approached her with the following individualized plan to manage  her diabetes and patient agrees:   - she will need at least basal insulin  in order for her to achieve control of diabetes to target, and she accepts this treatment option at this time.    -She is advised to continue her Tresiba  12 units SQ nightly and stop the Trulicity  altogether today to see if her GI symptoms improve.  -She can stay off Farxiga (and like products- due to polyuria and stress incontinence) and Glipizide  (due to hypoglycemia).  -She is encouraged to continue monitoring blood glucose twice daily (using her CGM), before breakfast and before bed, and to call the clinic if she has readings less than 70 or greater than 300 for 3 tests in a row.   - Specific targets for  A1c;  LDL, HDL,  and Triglycerides were discussed  with the patient.  2) Blood Pressure /Hypertension:   Her blood pressure is controlled to target for her age.   she is advised to continue her current medications including Losartan  25 mg p.o. daily with breakfast, and Lasix  20 every other day.  Will defer changes to nephrologist.  3) Lipids/Hyperlipidemia:    Review of her recent lipid panel from 10/05/23 showed controlled LDL at 44.  she  is advised to continue Crestor  10 mg p.o. daily at bedtime.   Side effects and precautions discussed with her.  4)  Weight/Diet:  Her Body mass index is  29.65 kg/m.  -   clearly complicating her diabetes care.   she is  a candidate for weight loss. I discussed with her the fact that loss of 5 - 10% of her  current body weight will have the most impact on her diabetes management.  Exercise, and detailed carbohydrates information provided  -  detailed on discharge instructions.  5) Chronic Care/Health Maintenance: -she  is on ACEI/ARB and Statin medications and  is encouraged to initiate and continue to follow up with Ophthalmology, Dentist,  Podiatrist at least yearly or according to recommendations, and advised to  stay away from smoking. I have recommended yearly flu vaccine and pneumonia vaccine at least every 5 years; she cannot exercise optimally due to her disequilibrium.  She is advised to sleep for at least 7 hours a day.  - she is  advised to maintain close follow up with [name] [name] PEDLAR, MD for primary care needs, as well as her other providers for optimal and coordinated care.     I spent  32  minutes in the care of the patient today including review of labs from CMP, Lipids, Thyroid  Function, Hematology (current and previous including abstractions from other facilities); face-to-face time discussing  her blood glucose readings/logs, discussing hypoglycemia and hyperglycemia episodes and symptoms, medications doses, her options of short and long term treatment based on the latest standards of care / guidelines;  discussion about incorporating lifestyle medicine;  and documenting the encounter. Risk reduction counseling performed per USPSTF guidelines to reduce obesity and cardiovascular risk factors.     Please refer to Patient Instructions for Blood Glucose Monitoring and Insulin /Medications Dosing Guide  in media tab for additional information. Please  also refer to  Patient Self Inventory in the Media  tab for reviewed elements of pertinent patient history.  Stephanie Moreno participated in the discussions, expressed understanding, and  voiced agreement with the above plans.  All questions were answered to her satisfaction. she is encouraged to contact clinic should she have any questions or concerns prior to her return visit.   Follow up plan: - Return in about 4 months (around 05/11/2024) for Diabetes F/U with A1c in office, No previsit labs, Bring meter and logs.  Benton Rio, [facility] [address] [place], [place] [address] Phone: [phone] Fax: [phone]  01/09/2024, 2:33 PM

## 2024-01-10 DIAGNOSIS — M549 Dorsalgia, unspecified: Secondary | ICD-10-CM | POA: Diagnosis not present

## 2024-01-10 DIAGNOSIS — M25552 Pain in left hip: Secondary | ICD-10-CM | POA: Diagnosis not present

## 2024-01-10 DIAGNOSIS — R2689 Other abnormalities of gait and mobility: Secondary | ICD-10-CM | POA: Diagnosis not present

## 2024-01-10 DIAGNOSIS — M6281 Muscle weakness (generalized): Secondary | ICD-10-CM | POA: Diagnosis not present

## 2024-01-10 DIAGNOSIS — M25652 Stiffness of left hip, not elsewhere classified: Secondary | ICD-10-CM | POA: Diagnosis not present

## 2024-01-10 DIAGNOSIS — M2560 Stiffness of unspecified joint, not elsewhere classified: Secondary | ICD-10-CM | POA: Diagnosis not present

## 2024-01-11 DIAGNOSIS — M25552 Pain in left hip: Secondary | ICD-10-CM | POA: Diagnosis not present

## 2024-01-11 DIAGNOSIS — M2560 Stiffness of unspecified joint, not elsewhere classified: Secondary | ICD-10-CM | POA: Diagnosis not present

## 2024-01-11 DIAGNOSIS — M6281 Muscle weakness (generalized): Secondary | ICD-10-CM | POA: Diagnosis not present

## 2024-01-11 DIAGNOSIS — M549 Dorsalgia, unspecified: Secondary | ICD-10-CM | POA: Diagnosis not present

## 2024-01-11 DIAGNOSIS — M25652 Stiffness of left hip, not elsewhere classified: Secondary | ICD-10-CM | POA: Diagnosis not present

## 2024-01-11 DIAGNOSIS — R2689 Other abnormalities of gait and mobility: Secondary | ICD-10-CM | POA: Diagnosis not present

## 2024-01-15 DIAGNOSIS — R2689 Other abnormalities of gait and mobility: Secondary | ICD-10-CM | POA: Diagnosis not present

## 2024-01-15 DIAGNOSIS — M549 Dorsalgia, unspecified: Secondary | ICD-10-CM | POA: Diagnosis not present

## 2024-01-15 DIAGNOSIS — M25552 Pain in left hip: Secondary | ICD-10-CM | POA: Diagnosis not present

## 2024-01-15 DIAGNOSIS — M6281 Muscle weakness (generalized): Secondary | ICD-10-CM | POA: Diagnosis not present

## 2024-01-15 DIAGNOSIS — M25652 Stiffness of left hip, not elsewhere classified: Secondary | ICD-10-CM | POA: Diagnosis not present

## 2024-01-15 DIAGNOSIS — M2560 Stiffness of unspecified joint, not elsewhere classified: Secondary | ICD-10-CM | POA: Diagnosis not present

## 2024-01-17 DIAGNOSIS — R2689 Other abnormalities of gait and mobility: Secondary | ICD-10-CM | POA: Diagnosis not present

## 2024-01-17 DIAGNOSIS — M2560 Stiffness of unspecified joint, not elsewhere classified: Secondary | ICD-10-CM | POA: Diagnosis not present

## 2024-01-17 DIAGNOSIS — J449 Chronic obstructive pulmonary disease, unspecified: Secondary | ICD-10-CM | POA: Insufficient documentation

## 2024-01-17 DIAGNOSIS — M25652 Stiffness of left hip, not elsewhere classified: Secondary | ICD-10-CM | POA: Diagnosis not present

## 2024-01-17 DIAGNOSIS — M25552 Pain in left hip: Secondary | ICD-10-CM | POA: Diagnosis not present

## 2024-01-17 DIAGNOSIS — M6281 Muscle weakness (generalized): Secondary | ICD-10-CM | POA: Diagnosis not present

## 2024-01-17 DIAGNOSIS — M549 Dorsalgia, unspecified: Secondary | ICD-10-CM | POA: Diagnosis not present

## 2024-01-22 DIAGNOSIS — M6281 Muscle weakness (generalized): Secondary | ICD-10-CM | POA: Diagnosis not present

## 2024-01-22 DIAGNOSIS — M2560 Stiffness of unspecified joint, not elsewhere classified: Secondary | ICD-10-CM | POA: Diagnosis not present

## 2024-01-22 DIAGNOSIS — M25652 Stiffness of left hip, not elsewhere classified: Secondary | ICD-10-CM | POA: Diagnosis not present

## 2024-01-22 DIAGNOSIS — M549 Dorsalgia, unspecified: Secondary | ICD-10-CM | POA: Diagnosis not present

## 2024-01-22 DIAGNOSIS — R2689 Other abnormalities of gait and mobility: Secondary | ICD-10-CM | POA: Diagnosis not present

## 2024-01-22 DIAGNOSIS — M25552 Pain in left hip: Secondary | ICD-10-CM | POA: Diagnosis not present

## 2024-01-25 DIAGNOSIS — M5416 Radiculopathy, lumbar region: Secondary | ICD-10-CM | POA: Diagnosis not present

## 2024-01-25 DIAGNOSIS — M549 Dorsalgia, unspecified: Secondary | ICD-10-CM | POA: Diagnosis not present

## 2024-01-25 DIAGNOSIS — E1165 Type 2 diabetes mellitus with hyperglycemia: Secondary | ICD-10-CM | POA: Diagnosis not present

## 2024-01-25 DIAGNOSIS — M25652 Stiffness of left hip, not elsewhere classified: Secondary | ICD-10-CM | POA: Diagnosis not present

## 2024-01-25 DIAGNOSIS — M1990 Unspecified osteoarthritis, unspecified site: Secondary | ICD-10-CM | POA: Diagnosis not present

## 2024-01-25 DIAGNOSIS — E782 Mixed hyperlipidemia: Secondary | ICD-10-CM | POA: Diagnosis not present

## 2024-01-25 DIAGNOSIS — M2560 Stiffness of unspecified joint, not elsewhere classified: Secondary | ICD-10-CM | POA: Diagnosis not present

## 2024-01-25 DIAGNOSIS — D638 Anemia in other chronic diseases classified elsewhere: Secondary | ICD-10-CM | POA: Diagnosis not present

## 2024-01-25 DIAGNOSIS — I10 Essential (primary) hypertension: Secondary | ICD-10-CM | POA: Diagnosis not present

## 2024-01-25 DIAGNOSIS — N1831 Chronic kidney disease, stage 3a: Secondary | ICD-10-CM | POA: Diagnosis not present

## 2024-01-25 DIAGNOSIS — K5909 Other constipation: Secondary | ICD-10-CM | POA: Diagnosis not present

## 2024-01-25 DIAGNOSIS — K219 Gastro-esophageal reflux disease without esophagitis: Secondary | ICD-10-CM | POA: Diagnosis not present

## 2024-01-25 DIAGNOSIS — M6281 Muscle weakness (generalized): Secondary | ICD-10-CM | POA: Diagnosis not present

## 2024-01-25 DIAGNOSIS — M25552 Pain in left hip: Secondary | ICD-10-CM | POA: Diagnosis not present

## 2024-01-25 DIAGNOSIS — R2689 Other abnormalities of gait and mobility: Secondary | ICD-10-CM | POA: Diagnosis not present

## 2024-01-25 DIAGNOSIS — J309 Allergic rhinitis, unspecified: Secondary | ICD-10-CM | POA: Diagnosis not present

## 2024-01-29 DIAGNOSIS — M6281 Muscle weakness (generalized): Secondary | ICD-10-CM | POA: Diagnosis not present

## 2024-01-29 DIAGNOSIS — M25652 Stiffness of left hip, not elsewhere classified: Secondary | ICD-10-CM | POA: Diagnosis not present

## 2024-01-29 DIAGNOSIS — M2560 Stiffness of unspecified joint, not elsewhere classified: Secondary | ICD-10-CM | POA: Diagnosis not present

## 2024-01-29 DIAGNOSIS — M549 Dorsalgia, unspecified: Secondary | ICD-10-CM | POA: Diagnosis not present

## 2024-01-29 DIAGNOSIS — M25552 Pain in left hip: Secondary | ICD-10-CM | POA: Diagnosis not present

## 2024-01-29 DIAGNOSIS — R2689 Other abnormalities of gait and mobility: Secondary | ICD-10-CM | POA: Diagnosis not present

## 2024-01-30 DIAGNOSIS — L110 Acquired keratosis follicularis: Secondary | ICD-10-CM | POA: Diagnosis not present

## 2024-01-30 DIAGNOSIS — M79672 Pain in left foot: Secondary | ICD-10-CM | POA: Diagnosis not present

## 2024-01-30 DIAGNOSIS — M79671 Pain in right foot: Secondary | ICD-10-CM | POA: Diagnosis not present

## 2024-01-30 DIAGNOSIS — M79674 Pain in right toe(s): Secondary | ICD-10-CM | POA: Diagnosis not present

## 2024-01-30 DIAGNOSIS — E1140 Type 2 diabetes mellitus with diabetic neuropathy, unspecified: Secondary | ICD-10-CM | POA: Diagnosis not present

## 2024-01-30 DIAGNOSIS — M79675 Pain in left toe(s): Secondary | ICD-10-CM | POA: Diagnosis not present

## 2024-01-30 DIAGNOSIS — I739 Peripheral vascular disease, unspecified: Secondary | ICD-10-CM | POA: Diagnosis not present

## 2024-01-31 ENCOUNTER — Telehealth: Payer: Self-pay

## 2024-01-31 ENCOUNTER — Ambulatory Visit: Admitting: Gastroenterology

## 2024-01-31 NOTE — Telephone Encounter (Signed)
 noted

## 2024-01-31 NOTE — Telephone Encounter (Signed)
 Pt LMOVM back here to reschedule her appt because of transportation issues this morning. Please reschedule the pt to be seen.

## 2024-02-01 DIAGNOSIS — E1165 Type 2 diabetes mellitus with hyperglycemia: Secondary | ICD-10-CM | POA: Diagnosis not present

## 2024-02-01 DIAGNOSIS — M25652 Stiffness of left hip, not elsewhere classified: Secondary | ICD-10-CM | POA: Diagnosis not present

## 2024-02-01 DIAGNOSIS — I10 Essential (primary) hypertension: Secondary | ICD-10-CM | POA: Diagnosis not present

## 2024-02-01 DIAGNOSIS — J449 Chronic obstructive pulmonary disease, unspecified: Secondary | ICD-10-CM | POA: Diagnosis not present

## 2024-02-01 DIAGNOSIS — M2560 Stiffness of unspecified joint, not elsewhere classified: Secondary | ICD-10-CM | POA: Diagnosis not present

## 2024-02-01 DIAGNOSIS — M6281 Muscle weakness (generalized): Secondary | ICD-10-CM | POA: Diagnosis not present

## 2024-02-01 DIAGNOSIS — M549 Dorsalgia, unspecified: Secondary | ICD-10-CM | POA: Diagnosis not present

## 2024-02-01 DIAGNOSIS — M25552 Pain in left hip: Secondary | ICD-10-CM | POA: Diagnosis not present

## 2024-02-01 DIAGNOSIS — E782 Mixed hyperlipidemia: Secondary | ICD-10-CM | POA: Diagnosis not present

## 2024-02-01 DIAGNOSIS — R2689 Other abnormalities of gait and mobility: Secondary | ICD-10-CM | POA: Diagnosis not present

## 2024-02-05 DIAGNOSIS — M25652 Stiffness of left hip, not elsewhere classified: Secondary | ICD-10-CM | POA: Diagnosis not present

## 2024-02-05 DIAGNOSIS — R2689 Other abnormalities of gait and mobility: Secondary | ICD-10-CM | POA: Diagnosis not present

## 2024-02-05 DIAGNOSIS — M549 Dorsalgia, unspecified: Secondary | ICD-10-CM | POA: Diagnosis not present

## 2024-02-05 DIAGNOSIS — M2560 Stiffness of unspecified joint, not elsewhere classified: Secondary | ICD-10-CM | POA: Diagnosis not present

## 2024-02-05 DIAGNOSIS — M6281 Muscle weakness (generalized): Secondary | ICD-10-CM | POA: Diagnosis not present

## 2024-02-05 DIAGNOSIS — M25552 Pain in left hip: Secondary | ICD-10-CM | POA: Diagnosis not present

## 2024-02-06 ENCOUNTER — Telehealth: Payer: Self-pay | Admitting: *Deleted

## 2024-02-06 NOTE — Telephone Encounter (Signed)
 Patient was called and made aware.

## 2024-02-06 NOTE — Telephone Encounter (Signed)
 Patient left a message that she had stopped the Trulicity  for 2 weeks, to see if this was what was causing her dizziness. She said after the 2 weeks that she has still been experiencing dizziness.  She is asking what would Whitney want her to do now, resume the Trulicity ?

## 2024-02-06 NOTE — Telephone Encounter (Signed)
 She has my blessing to restart.  With her being off for 2 weeks she should be ok to resume her 3 mg dose.

## 2024-02-07 ENCOUNTER — Other Ambulatory Visit (INDEPENDENT_AMBULATORY_CARE_PROVIDER_SITE_OTHER)

## 2024-02-07 ENCOUNTER — Ambulatory Visit (INDEPENDENT_AMBULATORY_CARE_PROVIDER_SITE_OTHER): Admitting: Orthopedic Surgery

## 2024-02-07 DIAGNOSIS — M545 Low back pain, unspecified: Secondary | ICD-10-CM | POA: Diagnosis not present

## 2024-02-07 NOTE — Progress Notes (Signed)
 Orthopedic Spine Surgery Office Note  Assessment: Patient is a 78 y.o. female with bilateral lateral hip and anterior thigh pain (L>R), possible radiculopathy   Plan: -Patient has tried PT, tylenol , meloxicam , norco - Patient has now tried over 6 weeks of treatment with Dr. Margrette including meloxicam , Norco, and PT so recommended MRI of the lumbar spine to evaluate for radiculopathy -Patient should return to office in 4-5 weeks, x-rays at next visit: None   Patient expressed understanding of the plan and all questions were answered to the patient's satisfaction.   ___________________________________________________________________________   History:  Patient is a 78 y.o. female who presents today for lumbar spine.  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  Surgery was 03/15/2021 with Dr. Lucilla.  Patient has had over a year of bilateral lateral hip and anterior thigh pain that goes to the level of the knees.  She feels the pain with activity and rest.  She says the left side is more symptomatic than the right.  Pain has been getting progressively worse with time.  She notes the pain on a daily basis.  She has been trying multiple treatments with Dr. Margrette but has not noticed any relief with those.  There is no trauma or injury that preceded the onset of the pain.   Weakness: yes, she feels her left leg is weaker than the right Symptoms of imbalance: had had chronic imbalance and uses a walker - no recent changes Paresthesias and numbness: yes, has numbness and paresthesias in her bilateral feet Bowel or bladder incontinence: yes, has had urinary incontinence since her lumbar surgery and uses depends - she has not noticed any recent changes in bowel or bladder habits Saddle anesthesia: denies  Treatments tried: PT, tylenol , meloxicam , norco  Review of systems: Denies fevers and chills, night sweats, unexplained weight loss.  Has a  history of breast cancer.  Has had pain that wakes her at night.  Past medical history: OSA GERD HTN DM (last A1c was 6.8 on 01/09/2024) Depression/anxiety History of breast cancer CKD Hypothyroidism  Allergies: codeine, HCTZ, lisinopril, oxybutynin, penicillin, carvedilol  Past surgical history:  L4/5 laminectomy, L5/S1 TLIF and PSIF Breast lumpectomy Hysterectomy Thyroidectomy  Social history: Denies use of nicotine product (smoking, vaping, patches, smokeless) Alcohol use: denies Denies recreational drug use   Physical Exam:  BMI of 30.1  General: no acute distress, appears stated age Neurologic: alert, answering questions appropriately, following commands Respiratory: unlabored breathing on room air, symmetric chest rise Psychiatric: appropriate affect, normal cadence to speech   MSK (spine):  -Strength exam      Left  Right EHL    5/5  5/5 TA    5/5  5/5 GSC    5/5  5/5 Knee extension  5/5  5/5 Hip flexion   5/5  5/5  -Sensory exam    Sensation intact to light touch in L3-S1 nerve distributions of bilateral lower extremities  Imaging: XRs of the lumbar spine from 02/07/2024 were independently reviewed and interpreted, showing laminectomy defect seen at L4/5.  Posterior instrumentation at L5 and S1.  No lucency seen around the screws.  There is an interbody device that appears well contained within the former L5/S1 disc space.  No significant adjacent segment disease seen.  No fracture or dislocation seen.  No evidence of instability on flexion/extension views.   Patient name: Stephanie Moreno Patient MRN: [id number] Date of visit: 02/07/24

## 2024-02-08 DIAGNOSIS — R2689 Other abnormalities of gait and mobility: Secondary | ICD-10-CM | POA: Diagnosis not present

## 2024-02-08 DIAGNOSIS — M25552 Pain in left hip: Secondary | ICD-10-CM | POA: Diagnosis not present

## 2024-02-08 DIAGNOSIS — M2560 Stiffness of unspecified joint, not elsewhere classified: Secondary | ICD-10-CM | POA: Diagnosis not present

## 2024-02-08 DIAGNOSIS — M549 Dorsalgia, unspecified: Secondary | ICD-10-CM | POA: Diagnosis not present

## 2024-02-08 DIAGNOSIS — M25652 Stiffness of left hip, not elsewhere classified: Secondary | ICD-10-CM | POA: Diagnosis not present

## 2024-02-08 DIAGNOSIS — M6281 Muscle weakness (generalized): Secondary | ICD-10-CM | POA: Diagnosis not present

## 2024-02-12 DIAGNOSIS — M25652 Stiffness of left hip, not elsewhere classified: Secondary | ICD-10-CM | POA: Diagnosis not present

## 2024-02-12 DIAGNOSIS — M6281 Muscle weakness (generalized): Secondary | ICD-10-CM | POA: Diagnosis not present

## 2024-02-12 DIAGNOSIS — M549 Dorsalgia, unspecified: Secondary | ICD-10-CM | POA: Diagnosis not present

## 2024-02-12 DIAGNOSIS — R2689 Other abnormalities of gait and mobility: Secondary | ICD-10-CM | POA: Diagnosis not present

## 2024-02-12 DIAGNOSIS — M25552 Pain in left hip: Secondary | ICD-10-CM | POA: Diagnosis not present

## 2024-02-12 DIAGNOSIS — M2560 Stiffness of unspecified joint, not elsewhere classified: Secondary | ICD-10-CM | POA: Diagnosis not present

## 2024-02-15 DIAGNOSIS — M6281 Muscle weakness (generalized): Secondary | ICD-10-CM | POA: Diagnosis not present

## 2024-02-15 DIAGNOSIS — M25552 Pain in left hip: Secondary | ICD-10-CM | POA: Diagnosis not present

## 2024-02-15 DIAGNOSIS — M25652 Stiffness of left hip, not elsewhere classified: Secondary | ICD-10-CM | POA: Diagnosis not present

## 2024-02-15 DIAGNOSIS — M2560 Stiffness of unspecified joint, not elsewhere classified: Secondary | ICD-10-CM | POA: Diagnosis not present

## 2024-02-15 DIAGNOSIS — R2689 Other abnormalities of gait and mobility: Secondary | ICD-10-CM | POA: Diagnosis not present

## 2024-02-15 DIAGNOSIS — M549 Dorsalgia, unspecified: Secondary | ICD-10-CM | POA: Diagnosis not present

## 2024-02-18 DIAGNOSIS — R2689 Other abnormalities of gait and mobility: Secondary | ICD-10-CM | POA: Diagnosis not present

## 2024-02-18 DIAGNOSIS — M549 Dorsalgia, unspecified: Secondary | ICD-10-CM | POA: Diagnosis not present

## 2024-02-18 DIAGNOSIS — M25552 Pain in left hip: Secondary | ICD-10-CM | POA: Diagnosis not present

## 2024-02-18 DIAGNOSIS — M2560 Stiffness of unspecified joint, not elsewhere classified: Secondary | ICD-10-CM | POA: Diagnosis not present

## 2024-02-18 DIAGNOSIS — M25652 Stiffness of left hip, not elsewhere classified: Secondary | ICD-10-CM | POA: Diagnosis not present

## 2024-02-18 DIAGNOSIS — M6281 Muscle weakness (generalized): Secondary | ICD-10-CM | POA: Diagnosis not present

## 2024-02-19 ENCOUNTER — Other Ambulatory Visit: Payer: Self-pay | Admitting: Gastroenterology

## 2024-02-19 DIAGNOSIS — M6281 Muscle weakness (generalized): Secondary | ICD-10-CM | POA: Diagnosis not present

## 2024-02-19 DIAGNOSIS — Z9181 History of falling: Secondary | ICD-10-CM | POA: Diagnosis not present

## 2024-02-19 DIAGNOSIS — R296 Repeated falls: Secondary | ICD-10-CM | POA: Diagnosis not present

## 2024-02-19 DIAGNOSIS — R2681 Unsteadiness on feet: Secondary | ICD-10-CM | POA: Diagnosis not present

## 2024-02-19 DIAGNOSIS — R262 Difficulty in walking, not elsewhere classified: Secondary | ICD-10-CM | POA: Diagnosis not present

## 2024-02-20 ENCOUNTER — Other Ambulatory Visit: Payer: Self-pay | Admitting: Gastroenterology

## 2024-02-20 DIAGNOSIS — M549 Dorsalgia, unspecified: Secondary | ICD-10-CM | POA: Diagnosis not present

## 2024-02-20 DIAGNOSIS — M6281 Muscle weakness (generalized): Secondary | ICD-10-CM | POA: Diagnosis not present

## 2024-02-20 DIAGNOSIS — R2689 Other abnormalities of gait and mobility: Secondary | ICD-10-CM | POA: Diagnosis not present

## 2024-02-20 DIAGNOSIS — M2560 Stiffness of unspecified joint, not elsewhere classified: Secondary | ICD-10-CM | POA: Diagnosis not present

## 2024-02-20 DIAGNOSIS — M25552 Pain in left hip: Secondary | ICD-10-CM | POA: Diagnosis not present

## 2024-02-20 DIAGNOSIS — M25652 Stiffness of left hip, not elsewhere classified: Secondary | ICD-10-CM | POA: Diagnosis not present

## 2024-02-22 ENCOUNTER — Ambulatory Visit (HOSPITAL_COMMUNITY)
Admission: RE | Admit: 2024-02-22 | Discharge: 2024-02-22 | Disposition: A | Discharge status: Home / Self Care | Source: Ambulatory Visit | Attending: Orthopedic Surgery | Admitting: Orthopedic Surgery

## 2024-02-22 ENCOUNTER — Encounter: Payer: Self-pay | Admitting: Radiology

## 2024-02-22 DIAGNOSIS — M545 Low back pain, unspecified: Secondary | ICD-10-CM | POA: Insufficient documentation

## 2024-02-22 DIAGNOSIS — M5116 Intervertebral disc disorders with radiculopathy, lumbar region: Secondary | ICD-10-CM | POA: Diagnosis not present

## 2024-02-22 DIAGNOSIS — Z981 Arthrodesis status: Secondary | ICD-10-CM | POA: Diagnosis not present

## 2024-02-22 DIAGNOSIS — M79659 Pain in unspecified thigh: Secondary | ICD-10-CM | POA: Diagnosis not present

## 2024-02-22 DIAGNOSIS — M48061 Spinal stenosis, lumbar region without neurogenic claudication: Secondary | ICD-10-CM | POA: Diagnosis not present

## 2024-02-26 DIAGNOSIS — M6281 Muscle weakness (generalized): Secondary | ICD-10-CM | POA: Diagnosis not present

## 2024-02-26 DIAGNOSIS — M25652 Stiffness of left hip, not elsewhere classified: Secondary | ICD-10-CM | POA: Diagnosis not present

## 2024-02-26 DIAGNOSIS — M549 Dorsalgia, unspecified: Secondary | ICD-10-CM | POA: Diagnosis not present

## 2024-02-26 DIAGNOSIS — M2560 Stiffness of unspecified joint, not elsewhere classified: Secondary | ICD-10-CM | POA: Diagnosis not present

## 2024-02-26 DIAGNOSIS — M25552 Pain in left hip: Secondary | ICD-10-CM | POA: Diagnosis not present

## 2024-02-26 DIAGNOSIS — R2689 Other abnormalities of gait and mobility: Secondary | ICD-10-CM | POA: Diagnosis not present

## 2024-02-28 DIAGNOSIS — M549 Dorsalgia, unspecified: Secondary | ICD-10-CM | POA: Diagnosis not present

## 2024-02-28 DIAGNOSIS — M2560 Stiffness of unspecified joint, not elsewhere classified: Secondary | ICD-10-CM | POA: Diagnosis not present

## 2024-02-28 DIAGNOSIS — M6281 Muscle weakness (generalized): Secondary | ICD-10-CM | POA: Diagnosis not present

## 2024-02-28 DIAGNOSIS — R2689 Other abnormalities of gait and mobility: Secondary | ICD-10-CM | POA: Diagnosis not present

## 2024-02-28 DIAGNOSIS — M25552 Pain in left hip: Secondary | ICD-10-CM | POA: Diagnosis not present

## 2024-02-28 DIAGNOSIS — M25652 Stiffness of left hip, not elsewhere classified: Secondary | ICD-10-CM | POA: Diagnosis not present

## 2024-03-03 DIAGNOSIS — E782 Mixed hyperlipidemia: Secondary | ICD-10-CM | POA: Diagnosis not present

## 2024-03-03 DIAGNOSIS — E1165 Type 2 diabetes mellitus with hyperglycemia: Secondary | ICD-10-CM | POA: Diagnosis not present

## 2024-03-05 DIAGNOSIS — M2560 Stiffness of unspecified joint, not elsewhere classified: Secondary | ICD-10-CM | POA: Diagnosis not present

## 2024-03-05 DIAGNOSIS — M25552 Pain in left hip: Secondary | ICD-10-CM | POA: Diagnosis not present

## 2024-03-05 DIAGNOSIS — M6281 Muscle weakness (generalized): Secondary | ICD-10-CM | POA: Diagnosis not present

## 2024-03-05 DIAGNOSIS — M25652 Stiffness of left hip, not elsewhere classified: Secondary | ICD-10-CM | POA: Diagnosis not present

## 2024-03-05 DIAGNOSIS — R2689 Other abnormalities of gait and mobility: Secondary | ICD-10-CM | POA: Diagnosis not present

## 2024-03-05 DIAGNOSIS — M549 Dorsalgia, unspecified: Secondary | ICD-10-CM | POA: Diagnosis not present

## 2024-03-07 ENCOUNTER — Telehealth: Payer: Self-pay

## 2024-03-07 DIAGNOSIS — M25652 Stiffness of left hip, not elsewhere classified: Secondary | ICD-10-CM | POA: Diagnosis not present

## 2024-03-07 DIAGNOSIS — M549 Dorsalgia, unspecified: Secondary | ICD-10-CM | POA: Diagnosis not present

## 2024-03-07 DIAGNOSIS — N1831 Chronic kidney disease, stage 3a: Secondary | ICD-10-CM

## 2024-03-07 DIAGNOSIS — I10 Essential (primary) hypertension: Secondary | ICD-10-CM

## 2024-03-07 DIAGNOSIS — M6281 Muscle weakness (generalized): Secondary | ICD-10-CM | POA: Diagnosis not present

## 2024-03-07 DIAGNOSIS — D638 Anemia in other chronic diseases classified elsewhere: Secondary | ICD-10-CM | POA: Diagnosis not present

## 2024-03-07 DIAGNOSIS — M1990 Unspecified osteoarthritis, unspecified site: Secondary | ICD-10-CM | POA: Diagnosis not present

## 2024-03-07 DIAGNOSIS — E1165 Type 2 diabetes mellitus with hyperglycemia: Secondary | ICD-10-CM | POA: Diagnosis not present

## 2024-03-07 DIAGNOSIS — M25552 Pain in left hip: Secondary | ICD-10-CM | POA: Diagnosis not present

## 2024-03-07 DIAGNOSIS — M2560 Stiffness of unspecified joint, not elsewhere classified: Secondary | ICD-10-CM | POA: Diagnosis not present

## 2024-03-07 DIAGNOSIS — M5416 Radiculopathy, lumbar region: Secondary | ICD-10-CM | POA: Diagnosis not present

## 2024-03-07 DIAGNOSIS — K219 Gastro-esophageal reflux disease without esophagitis: Secondary | ICD-10-CM | POA: Diagnosis not present

## 2024-03-07 DIAGNOSIS — J309 Allergic rhinitis, unspecified: Secondary | ICD-10-CM | POA: Diagnosis not present

## 2024-03-07 DIAGNOSIS — R2689 Other abnormalities of gait and mobility: Secondary | ICD-10-CM | POA: Diagnosis not present

## 2024-03-07 DIAGNOSIS — E782 Mixed hyperlipidemia: Secondary | ICD-10-CM | POA: Diagnosis not present

## 2024-03-07 DIAGNOSIS — K5909 Other constipation: Secondary | ICD-10-CM | POA: Diagnosis not present

## 2024-03-07 DIAGNOSIS — E1122 Type 2 diabetes mellitus with diabetic chronic kidney disease: Secondary | ICD-10-CM

## 2024-03-11 ENCOUNTER — Other Ambulatory Visit: Payer: Self-pay | Admitting: *Deleted

## 2024-03-11 ENCOUNTER — Telehealth: Payer: Self-pay | Admitting: *Deleted

## 2024-03-11 ENCOUNTER — Encounter: Payer: Self-pay | Admitting: *Deleted

## 2024-03-11 NOTE — Patient Outreach (Signed)
 Complex Care Management   Visit Note  03/11/2024  Name:  Stephanie Moreno MRN: [id number] DOB: 15-Oct-1945  Situation: Referral received for Complex Care Management related to Diabetes with Complications, Chronic Kidney Disease, and hypertension I obtained verbal consent from Patient.  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- burns awake   Want a oncology for left breast cancer in 2003 taking mammograms  Erin judge working on this niece with lung ca stage 1     Background:   Past Medical History:  Diagnosis Date   Acid reflux disease    Anemia    Anxiety    Asthma    Breast cancer (HCC)    left side   Chronic back pain    Chronic kidney disease    Depression    Diabetes mellitus without complication (HCC)    Diverticulitis    Hypertension    Lumbosacral spondylosis    Per previous PCP records   Sleep apnea    Thyroid  disease     Assessment: Patient Reported Symptoms:  Cognitive Cognitive Status: Alert and oriented to person, place, and time, Insightful and able to interpret abstract concepts, Normal speech and language skills Cognitive/Intellectual Conditions Management [RPT]: None reported or documented in medical history or problem list   Health Maintenance Behaviors: Healthy diet, Annual physical exam Healing Pattern: Average Health Facilitated by: Pain control, Prayer/meditation, Rest, Healthy diet  Neurological Neurological Review of Symptoms: Headaches,  Numbness Neurological Comment: numbness in hand  HEENT HEENT Symptoms Reported: No symptoms reported HEENT Management Strategies: Routine screening HEENT Self-Management Outcome: 4 (good)    Cardiovascular Cardiovascular Symptoms Reported: Dizziness, Lightheadness Does patient have uncontrolled Hypertension?: Yes Is patient checking Blood Pressure at home?: Yes Cardiovascular Management Strategies: Adequate rest, Medical device, Medication therapy, Routine screening Cardiovascular Self-Management Outcome: 3 (uncertain)  Respiratory Respiratory Symptoms Reported: No symptoms reported Respiratory Management Strategies: Routine screening Respiratory Self-Management Outcome: 4 (good)  Endocrine Endocrine Symptoms Reported: Hypoglycemia, Increased thirst, Headaches Is patient diabetic?: Yes Is patient checking blood sugars at home?: Yes Endocrine Self-Management Outcome: 3 (uncertain)  Gastrointestinal Gastrointestinal Symptoms Reported: No symptoms reported Gastrointestinal Self-Management Outcome: 4 (good)    Genitourinary Genitourinary Symptoms Reported: No symptoms reported Genitourinary Self-Management Outcome: 4 (good)  Integumentary Integumentary Symptoms Reported: Itching Additional Integumentary Details: itching of left hand and foot Skin Management Strategies: Routine screening Skin Self-Management Outcome: 3 (uncertain)  Musculoskeletal Musculoskelatal Symptoms Reviewed: Joint pain, Difficulty walking Musculoskeletal Management Strategies: Adequate rest, Medication therapy, Routine screening Musculoskeletal Self-Management Outcome: 3 (uncertain) Falls in the past year?: No Number of falls in past year: 1 or less Was there an injury with Fall?: No Fall Risk Category Calculator: 0 Patient Fall Risk Level: Low Fall Risk Fall risk Follow up: Falls evaluation completed  Psychosocial Psychosocial Symptoms Reported: No symptoms reported Behavioral Management Strategies: Adequate  rest, Support system Behavioral Health Self-Management Outcome: 4 (good) Major Change/Loss/Stressor/Fears (CP): Medical condition, self, Resources Techniques to [place]ber with Loss/Stress/Change: Diversional activities  Quality of Family Relationships: helpful, involved, supportive Do you feel physically threatened by others?: No    03/11/2024    PHQ2-9 Depression Screening   Little interest or pleasure in doing things    Feeling down, depressed, or hopeless    PHQ-2 - Total Score    Trouble falling or staying asleep, or sleeping too much    Feeling tired or having little energy    Poor appetite or overeating     Feeling bad about yourself - or that you are a failure or have let yourself or your family down    Trouble concentrating on things, such as reading the newspaper or watching television    Moving or speaking so slowly that other people could have noticed.  Or the opposite - being so fidgety or restless that you have been moving around a lot more than usual    Thoughts that you would be better off dead, or hurting yourself in some way    PHQ2-9 Total Score    If you checked off any problems, how difficult have these problems made it for you to do your work, take care of things at home, or get along with other people    Depression Interventions/Treatment      There were no vitals filed for this visit.  Medications Reviewed Today     Reviewed by Ramonita Suzen CROME, RN (Registered Nurse) on 03/11/24 at [id number]  Med List Status: <None>   Medication Order Taking? Sig Documenting Provider Last Dose Status Informant  acetaminophen  (TYLENOL ) 325 MG tablet [id number]  Take 650 mg by mouth every 4 (four) hours as needed for mild pain (pain score 1-3). [provider]  Active Self  amLODipine  (NORVASC ) 10 MG tablet [id number]  Take 5 mg by mouth daily. [provider]  Active Self  Azelastine HCl 137 MCG/SPRAY SOLN [id number]  Place 2 sprays into both nostrils 2 (two) times daily.  [provider]  Active Self  Blood Glucose Monitoring Suppl [facility] VERIO) w/Device KIT [id number] Yes Use to check glucose twice daily, use as backup to CGM Therisa Benton PARAS, NP  Active Self  Continuous Glucose Receiver (FREESTYLE LIBRE 2 READER) DEVI [id number] Yes USE TO CHECK GLUCOSE AS DIRECTED Nida, Gebreselassie W, MD  Active Self  Continuous Glucose Receiver (FREESTYLE LIBRE 3 READER) DEVI [id number] Yes Use the Reader to monitor your blood sugar readings daily Therisa Benton PARAS, NP  Active Self  Continuous Glucose Sensor (FREESTYLE LIBRE 3 PLUS SENSOR) MISC [id number] Yes Change sensor every 15 days. Therisa Benton PARAS, NP  Active Self  Continuous Glucose Sensor (FREESTYLE LIBRE 3 PLUS SENSOR) MISC [id number] Yes Change sensor every 15 days as directed to monitor blood sugar. Therisa Benton PARAS, NP  Active   fluticasone  (FLOVENT  HFA) 220 MCG/ACT inhaler [id number]  Inhale 1 puff into the lungs 2 (two) times daily. [provider]  Active Self  fluticasone  furoate-vilanterol (BREO ELLIPTA ) 100-25 MCG/ACT AEPB [id number]  Inhale 1 puff into the lungs daily. Therisa Benton PARAS, NP  Active Self  furosemide  (LASIX ) 20 MG tablet [id number]  Take 1 tablet (20 mg total) by mouth every other day. Takes 40 mg every other day. On alternate days take 80 mg  Patient taking differently: Take 20 mg by mouth 2 (two) times daily.   Vicci Afton CROME, MD  Active Self  hydrALAZINE (APRESOLINE) 50 MG tablet [id number]  Take 50 mg by mouth 2 (two) times daily. [provider]  Active Self  HYDROcodone -acetaminophen  (  NORCO) 7.5-325 MG tablet [id number]  Take 1 tablet by mouth 2 (two) times daily as needed for moderate pain (pain score 4-6). Margrette Taft BRAVO, MD  Active   Insulin  Degludec (TRESIBA ) 100 UNIT/ML SOLN [id number]  Inject 12 Units into the skin at bedtime. [provider]  Active Self  Insulin  Pen Needle (BD PEN NEEDLE MICRO U/F) 32G X 6 MM MISC [id number] Yes Inject 1  each into the skin at bedtime. Therisa Benton PARAS, NP  Active   Insulin  Pen Needle 31G X 6 MM MISC [id number]  1 each by Does not apply route daily. Therisa Benton PARAS, NP  Active Self  Ipratropium-Albuterol  (COMBIVENT  RESPIMAT) 20-100 MCG/ACT AERS respimat [id number]  Inhale 1 puff into the lungs every 6 (six) hours as needed. Maree, Pratik D, DO  Active   levocetirizine (XYZAL ) 5 MG tablet [id number]  TAKE 1 TABLET BY MOUTH EVERY EVENING AS NEEDED FOR ALLERGIES  Patient taking differently: Take 5 mg by mouth every evening.   Shirlean Therisa ORN, NP  Active Self  lidocaine  (LIDODERM ) 5 % [id number]  Place 1 patch onto the skin daily as needed (pain). [provider]  Active Self           Med Note DRENA, ANGELICA G   Tue Aug 28, 2023  9:21 PM)    losartan  (COZAAR ) 25 MG tablet [id number]  Take 25 mg by mouth daily.  Patient not taking: Reported on 01/09/2024   [provider]  Active Self           Med Note PATTRICIA, CRYSTAL L   Wed May 17, 2022 10:06 AM)    losartan  (COZAAR ) 50 MG tablet [id number]  Take 50 mg by mouth daily. [provider]  Active Self  lubiprostone  (AMITIZA ) 8 MCG capsule [id number]  TAKE 1 CAPSULE (8 MCG TOTAL) BY MOUTH 2 (TWO) TIMES DAILY WITH A MEAL. Shirlean Therisa ORN, NP  Active   meloxicam  (MOBIC ) 7.5 MG tablet [id number]  Take 1 tablet (7.5 mg total) by mouth 2 (two) times daily. Margrette Taft BRAVO, MD  Active Self  MYRBETRIQ  50 MG TB24 tablet [id number]  TAKE 1 TABLET BY MOUTH EVERY DAY AT [place] Clara Belvie CROME, MD  Active   omeprazole  (PRILOSEC) 40 MG capsule [id number]  Take 1 capsule (40 mg total) by mouth daily before breakfast. Shirlean Therisa ORN, NP  Active Self  ondansetron  (ZOFRAN ) 4 MG tablet [id number]  Take 1 tablet (4 mg total) by mouth every 8 (eight) hours as needed for nausea or vomiting. Shirlean Therisa ORN, NP  Active Self  OVER THE COUNTER MEDICATION [id number]  Prevagine one per day for memory. [provider]  Active Self  pantoprazole   (PROTONIX ) 40 MG tablet [id number]  TAKE 1 TABLET (40 MG TOTAL) BY MOUTH TWICE A DAY BEFORE MEALS Shirlean Therisa ORN, NP  Active   rosuvastatin  (CRESTOR ) 10 MG tablet [id number]  Take 10 mg by mouth daily. [provider]  Active Self  sucralfate  (CARAFATE ) 1 g tablet [id number]  TAKE 1 TABLET BY MOUTH FOUR TIMES DAILY WITH MEALS AND AT BEDTIME Shirlean Therisa ORN, NP  Active             Recommendation:   PCP Follow-up  Follow Up Plan:   Telephone follow up appointment date/time:  04/07/24 3:45 pm   [name], RN, BSN, CCM [place]  [facility], [facility] RN Care Manager Direct Dial: [phone]  Fax: [phone]

## 2024-03-11 NOTE — Patient Instructions (Signed)
 Stephanie Moreno - I am sorry I was unable to reach you today for our scheduled appointment. I work with [name], [name] PEDLAR, MD and am calling to support your healthcare needs. Please contact me at [phone] at your earliest convenience. I look forward to speaking with you soon.   Thank you,  [name], RN, BSN, CCM [place]  [facility], [facility] RN Care Manager Direct Dial: [phone]  Fax: [phone]

## 2024-03-11 NOTE — Progress Notes (Signed)
 Complex Care Management Note Care Guide Note  03/11/2024 Name: Stephanie Moreno MRN: [id number] DOB: 1945-09-18  Stephanie Moreno is a 78 y.o. year old female who is a primary care patient of [name], [name] PEDLAR, MD . The community resource team was consulted for assistance with Transportation Needs   SDOH screenings and interventions completed:  Yes  Social Drivers of Health From This Encounter   Transportation Needs: Unmet Transportation Needs (03/11/2024)   PRAPARE - Administrator, Civil Service (Medical): Yes    SDOH Interventions Today    Flowsheet Row Most Recent Value  SDOH Interventions   Transportation Interventions Community Resources Provided, Payor Benefit  [Has UC dual complete has used all rides [employer] will not cross the cunty line to bring her to [employer] appts]  Financial Strain Interventions --  [Offered food banks etc patient declined saying ok except transportation]     Care guide performed the following interventions: Patient provided with information about care guide support team and interviewed to confirm resource needs.  Follow Up Plan:  No further follow up planned at this time. The patient has been provided with needed resources.  Encounter Outcome:  Visit complete  Gentle Hoge Greenauer[place]Moran  [facility] Health[facility] Health Care Guide  Direct [phone] Fax:[phone] Website: [email or web address]

## 2024-03-12 ENCOUNTER — Other Ambulatory Visit: Payer: Self-pay | Admitting: Gastroenterology

## 2024-03-13 ENCOUNTER — Other Ambulatory Visit: Payer: Self-pay | Admitting: *Deleted

## 2024-03-13 ENCOUNTER — Ambulatory Visit: Admitting: Gastroenterology

## 2024-03-13 VITALS — BP 131/81 | HR 97 | Temp 98.6°F | Ht 63.0 in | Wt 170.6 lb

## 2024-03-13 DIAGNOSIS — D509 Iron deficiency anemia, unspecified: Secondary | ICD-10-CM

## 2024-03-13 DIAGNOSIS — K5900 Constipation, unspecified: Secondary | ICD-10-CM

## 2024-03-13 DIAGNOSIS — R109 Unspecified abdominal pain: Secondary | ICD-10-CM | POA: Diagnosis not present

## 2024-03-13 DIAGNOSIS — I499 Cardiac arrhythmia, unspecified: Secondary | ICD-10-CM

## 2024-03-13 DIAGNOSIS — K219 Gastro-esophageal reflux disease without esophagitis: Secondary | ICD-10-CM | POA: Diagnosis not present

## 2024-03-13 DIAGNOSIS — R1013 Epigastric pain: Secondary | ICD-10-CM

## 2024-03-13 NOTE — Progress Notes (Signed)
 Gastroenterology Office Note     Primary Care Physician:  Stephanie [name] PEDLAR, MD  Primary Gastroenterologist: Dr. Cindie   Chief Complaint   Chief Complaint  Patient presents with   Follow-up    Follow up on constipation. Her pantoprazole  was refilled yesterday.(Pt isn't on it she is on Omeprazole )     History of Present Illness   Stephanie Moreno is a 78 y.o female presenting today with a history of abdominal pain and nausea, constipation, GERD, anemia with component of IDA, chronic disease, with IDA resolved and previously evaluated. previously established with GI in [place]  but moved to this area in June 2021. Presents today for follow up on constipation.   Constipation is getting better. Currently on Amitiza . Reports having a formed bowel movement 1-2 daily. No straining. Currently on Amitiza  8 mcg BID. No melena, hematochezia. Has been out of Norco for about a month. NO NSAIDS.  Abdominal pain above umbilicus, starts sweating and has nausea, then after BM she feels a lot better. This occurs every 2-3 days.   GERD: May have heartburn 2/week that lasts for about 45 minutes. Not on pantoprazole , taking omeprazole  and it is working ok. Denies dysphagia.  Has been out of Norco for about a month.    IDA: last Hgb 12.2 10/25/2023. No overt GI bleeding. EGD on file 2023.   Oct 2023: small hiatal hernia, mild Schatzki ring, s/p dilation. Gastritis s/p biopsy. Negative H.pylori.    Colonoscopy 2021: internal hemorrhoids, diverticula  Capsule 2021: small bowel mucosa with few nonbleeding erosions proximally, single polypoid type lesion with normal mucosa reviewed with Dr. Shaaron who felt insignificant.    Past Medical History:  Diagnosis Date   Acid reflux disease    Anemia    Anxiety    Asthma    Breast cancer (HCC)    left side   Chronic back pain    Chronic kidney disease    Depression    Diabetes mellitus without complication (HCC)    Diverticulitis    Hypertension     Lumbosacral spondylosis    Per previous PCP records   Sleep apnea    Thyroid  disease     Past Surgical History:  Procedure Laterality Date   ABDOMINAL HYSTERECTOMY     BACK SURGERY     BALLOON DILATION N/A 04/24/2022   Procedure: BALLOON DILATION;  Surgeon: Stephanie Moreno Carlin POUR, DO;  [facility];  Service: Endoscopy;  Laterality: N/A;   BIOPSY  02/19/2020   Procedure: BIOPSY;  Surgeon: Stephanie Moreno Carlin POUR, DO;  [facility];  Service: Endoscopy;;   BIOPSY  04/24/2022   Procedure: BIOPSY;  Surgeon: Stephanie Moreno Carlin POUR, DO;  [facility];  Service: Endoscopy;;   BREAST BIOPSY Right    x 3   BREAST LUMPECTOMY Left 2003   COLONOSCOPY WITH PROPOFOL  N/A 02/19/2020   non-bleeding internal hemorrhoids, many small-mouthed diverticula in entire colon.   ESOPHAGOGASTRODUODENOSCOPY (EGD) WITH PROPOFOL  N/A 02/19/2020    benign-appearing esophageal stenosis s/p dilation, gastritis s/p biopsy, normal duodenum. Negative H.pylori, negative duodenal biopsy.    ESOPHAGOGASTRODUODENOSCOPY (EGD) WITH PROPOFOL  N/A 04/24/2022   small hiatal hernia, mild Schatzki ring, s/p dilation. Gastritis s/p biopsy. Negative H.pylori.   GIVENS CAPSULE STUDY N/A 06/14/2020   Procedure: GIVENS CAPSULE STUDY;  Surgeon: Stephanie Moreno Carlin POUR, DO;  [facility];  Service: Endoscopy;  Laterality: N/A;  7:30am   THYROIDECTOMY     tummy tuck     at  time of breast surgery 2003    Current Outpatient Medications  Medication Sig Dispense Refill   acetaminophen  (TYLENOL ) 325 MG tablet Take 650 mg by mouth every 4 (four) hours as needed for mild pain (pain score 1-3).     amLODipine  (NORVASC ) 10 MG tablet Take 5 mg by mouth daily.     Azelastine HCl 137 MCG/SPRAY SOLN Place 2 sprays into both nostrils 2 (two) times daily.     Blood Glucose Monitoring Suppl (ONETOUCH VERIO) w/Device KIT Use to check glucose twice daily, use as backup to CGM 1 kit 0   Continuous Glucose Receiver (FREESTYLE  LIBRE 2 READER) DEVI USE TO CHECK GLUCOSE AS DIRECTED 1 each 0   Continuous Glucose Receiver (FREESTYLE LIBRE 3 READER) DEVI Use the Reader to monitor your blood sugar readings daily 1 each 0   Continuous Glucose Sensor (FREESTYLE LIBRE 3 PLUS SENSOR) MISC Change sensor every 15 days. 2 each 6   Continuous Glucose Sensor (FREESTYLE LIBRE 3 PLUS SENSOR) MISC Change sensor every 15 days as directed to monitor blood sugar. 6 each 1   Continuous Glucose Sensor (FREESTYLE LIBRE 3 PLUS SENSOR) MISC USE TO TEST BLOOD SUGAR AS DIRECTED; CHANGE SENSOR EVERY 15 DAYS     Dulaglutide  (TRULICITY ) 3 MG/0.5ML SOAJ INJECT 3MG  SUBCUTANEOUSLY ONCE WEEKLY     fluticasone  (FLOVENT  HFA) 220 MCG/ACT inhaler Inhale 1 puff into the lungs 2 (two) times daily.     fluticasone  furoate-vilanterol (BREO ELLIPTA ) 100-25 MCG/ACT AEPB Inhale 1 puff into the lungs daily. 90 each 3   furosemide  (LASIX ) 20 MG tablet Take 1 tablet (20 mg total) by mouth every other day. Takes 40 mg every other day. On alternate days take 80 mg (Patient taking differently: Take 20 mg by mouth 2 (two) times daily.)     hydrALAZINE (APRESOLINE) 50 MG tablet Take 50 mg by mouth 2 (two) times daily.     HYDROcodone -acetaminophen  (NORCO) 7.5-325 MG tablet Take 1 tablet by mouth 2 (two) times daily as needed for moderate pain (pain score 4-6). 30 tablet 0   Insulin  Pen Needle (BD PEN NEEDLE MICRO U/F) 32G X 6 MM MISC Inject 1 each into the skin at bedtime. 100 each 1   Insulin  Pen Needle 31G X 6 MM MISC 1 each by Does not apply route daily. 100 each 3   Ipratropium-Albuterol  (COMBIVENT  RESPIMAT) 20-100 MCG/ACT AERS respimat Inhale 1 puff into the lungs every 6 (six) hours as needed. 4 g 1   LANTUS  SOLOSTAR 100 UNIT/ML Solostar Pen SMARTSIG:35 Unit(s) SUB-Q Every Night (Patient taking differently: 12 Units. Taking 12 units)     levocetirizine (XYZAL ) 5 MG tablet TAKE 1 TABLET BY MOUTH EVERY EVENING AS NEEDED FOR ALLERGIES (Patient taking differently: Take 5 mg  by mouth every evening.) 30 tablet 10   lidocaine  (LIDODERM ) 5 % Place 1 patch onto the skin daily as needed (pain).     losartan  (COZAAR ) 50 MG tablet Take 50 mg by mouth daily.     lubiprostone  (AMITIZA ) 8 MCG capsule TAKE 1 CAPSULE (8 MCG TOTAL) BY MOUTH 2 (TWO) TIMES DAILY WITH A MEAL. 180 capsule 0   MYRBETRIQ  50 MG TB24 tablet TAKE 1 TABLET BY MOUTH EVERY DAY AT 9AM 90 tablet 3   omeprazole  (PRILOSEC) 40 MG capsule Take 1 capsule (40 mg total) by mouth daily before breakfast. 90 capsule 3   ondansetron  (ZOFRAN ) 4 MG tablet Take 1 tablet (4 mg total) by mouth every 8 (eight) hours as needed for  nausea or vomiting. 30 tablet 1   pantoprazole  (PROTONIX ) 40 MG tablet TAKE 1 TABLET (40 MG TOTAL) BY MOUTH TWICE A DAY BEFORE MEALS 180 tablet 0   rosuvastatin  (CRESTOR ) 10 MG tablet Take 10 mg by mouth daily.     sucralfate  (CARAFATE ) 1 g tablet TAKE 1 TABLET BY MOUTH FOUR TIMES DAILY WITH MEALS AND AT BEDTIME 120 tablet 3   losartan  (COZAAR ) 25 MG tablet Take 25 mg by mouth daily. (Patient not taking: Reported on 03/13/2024)     meloxicam  (MOBIC ) 7.5 MG tablet Take 1 tablet (7.5 mg total) by mouth 2 (two) times daily. (Patient not taking: Reported on 03/13/2024) 60 tablet 10   OVER THE COUNTER MEDICATION Prevagine one per day for memory.     No current facility-administered medications for this visit.    Allergies as of 03/13/2024 - Review Complete 03/13/2024  Allergen Reaction Noted   Codeine Anaphylaxis and Nausea And Vomiting 12/18/2019   Carvedilol  09/16/2021   Hydrochlorothiazide Other (See Comments) 12/18/2019   Lisinopril Nausea And Vomiting 12/18/2019   Oxybutynin Other (See Comments) 12/18/2019   Penicillins Hives 01/05/2020    Family History  Problem Relation Age of Onset   Diabetes Mother    Thyroid  disease Mother    Colon cancer Neg Hx     Social History   Socioeconomic History   Marital status: Divorced    Spouse name: Not on file   Number of children: Not on file    Years of education: Not on file   Highest education level: Not on file  Occupational History   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 [place]   Tobacco Use   Smoking status: Never   Smokeless tobacco: Never  Vaping Use   Vaping status: Never Used  Substance and Sexual Activity   Alcohol use: Not Currently   Drug use: Not Currently   Sexual activity: Not on file  Other Topics Concern   Not on file  Social History Narrative   Not on file   Social Drivers of Health   Financial Resource Strain: High Risk (03/11/2024)   Overall Financial Resource Strain (CARDIA)    Difficulty of Paying Living Expenses: Hard  Food Insecurity: No Food Insecurity (08/31/2023)   Hunger Vital Sign    Worried About Running Out of Food in the Last Year: Never true    Ran Out of Food in the Last Year: Never true  Transportation Needs: Unmet Transportation Needs (03/11/2024)   PRAPARE - Administrator, Civil Service (Medical): Yes    Lack of Transportation (Non-Medical): Not on file  Physical Activity: Not on file  Stress: Not on file  Social Connections: Moderately Integrated (08/28/2023)   Social Connection and Isolation Panel    Frequency of Communication with Friends and Family: Three times a week    Frequency of Social Gatherings with Friends and Family: Once a week    Attends Religious Services: More than 4 times per year    Active Member of [employer] or Organizations: No    Attends Engineer, structural: More than 4 times per year    Marital Status: Divorced  Catering manager Violence: Not At Risk (08/31/2023)   Humiliation, Afraid, Rape, and Kick questionnaire    Fear of Current or Ex-Partner: No    Emotionally Abused: No    Physically Abused: No    Sexually Abused: No     Review of Systems   Gen: Denies  any fever, chills, fatigue, weight loss, lack of appetite.  CV: Denies chest pain, heart palpitations, peripheral edema, syncope.  Resp: Denies shortness of  breath at rest or with exertion. Denies wheezing or cough.  GI: Denies dysphagia or odynophagia. Denies jaundice, hematemesis, fecal incontinence. GU : Denies urinary burning, urinary frequency, urinary hesitancy MS: Denies joint pain, muscle weakness, cramps, or limitation of movement.  Derm: Denies rash, itching, dry skin Psych: Denies depression, anxiety, memory loss, and confusion Heme: Denies bruising, bleeding, and enlarged lymph nodes.   Physical Exam   BP 131/81   Pulse 97   Temp 98.6 F (37 C)   Ht 5' 3 (1.6 m)   Wt 170 lb 9.6 oz (77.4 kg)   BMI 30.22 kg/m  General:   Alert and oriented. Pleasant and cooperative. Well-nourished and well-developed.  Head:  Normocephalic and atraumatic. Eyes:  Without icterus Cardiac: irregularly irregular Abdomen:  +BS, soft, mild TTP epgiastric and non-distended. No HSM noted. No guarding or rebound. No masses appreciated.  Rectal:  Deferred  Msk:  Symmetrical without gross deformities. Normal posture. Extremities:  Without edema. Neurologic:  Alert and  oriented x4;  grossly normal neurologically. Skin:  Intact without significant lesions or rashes. Psych:  Alert and cooperative. Normal mood and affect.   Assessment   Stephanie Moreno is a 78 y.o female presenting today with a history of abdominal pain and nausea, constipation, GERD, anemia with component of IDA, chronic disease, presenting today for follow-up on constipation.  Constipation: Currently on Amitiza  and is getting better. Has 1-2 BM's daily without straining.   Abdominal pain: Occurs every 2-3 days. Improves after having a bowel movement. Suspect related to constipation and could consider increasing Amitiza  to 24 mcg BID, but she feels she is emptying well. Gallbladder still present. Will order US  to check for biliary etiology of abdominal pain although less likely.  Labs and US  to be ordered.   GERD: Continue on omeprazole .   Irregular heart rhythm: Denies chest pain or  shortness of breath. Will refer to referred to cardiology for further work-up.   PLAN    Labs: CBC w/diff, CMET, Iron , TIBC, Ferritin Abd ultrasound Ocntinue Amitia 8 mcg BID with meals Continue omeprazole  40 mg once daily Referred to Cardiology Follow-up in 4-6 weeks to discuss results and next steps.   Therisa MICAEL Stager, PhD, ANP-BC [facility] Gastroenterology

## 2024-03-13 NOTE — Patient Instructions (Addendum)
 Please have blood work done.   I have also ordered an ultrasound of your gallbladder.   We can talk about the endoscopy and colonoscopy when you return to discuss your blood work!   I am referring you to the cardiologist due to the irregular heart beat, which may just be a benign finding. I just want to ensure that is followed!  We will see you in about 4-6 weeks!  I enjoyed seeing you again today! I value our relationship and want to provide genuine, compassionate, and quality care. You may receive a survey regarding your visit with me, and I welcome your feedback! Thanks so much for taking the time to complete this. I look forward to seeing you again.      Therisa MICAEL Stager, PhD, ANP-BC [facility] Gastroenterology

## 2024-03-14 ENCOUNTER — Encounter: Payer: Self-pay | Admitting: *Deleted

## 2024-03-14 DIAGNOSIS — M6281 Muscle weakness (generalized): Secondary | ICD-10-CM | POA: Diagnosis not present

## 2024-03-14 DIAGNOSIS — M25552 Pain in left hip: Secondary | ICD-10-CM | POA: Diagnosis not present

## 2024-03-14 DIAGNOSIS — R2689 Other abnormalities of gait and mobility: Secondary | ICD-10-CM | POA: Diagnosis not present

## 2024-03-14 DIAGNOSIS — M25652 Stiffness of left hip, not elsewhere classified: Secondary | ICD-10-CM | POA: Diagnosis not present

## 2024-03-14 DIAGNOSIS — M2560 Stiffness of unspecified joint, not elsewhere classified: Secondary | ICD-10-CM | POA: Diagnosis not present

## 2024-03-17 ENCOUNTER — Ambulatory Visit (INDEPENDENT_AMBULATORY_CARE_PROVIDER_SITE_OTHER): Admitting: Orthopedic Surgery

## 2024-03-17 DIAGNOSIS — M5416 Radiculopathy, lumbar region: Secondary | ICD-10-CM | POA: Diagnosis not present

## 2024-03-17 NOTE — Progress Notes (Signed)
 Orthopedic Spine Surgery Office Note   Assessment: Patient is a 78 y.o. female with bilateral lateral hip and anterior thigh pain (L>R).  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  I went over the pros/cons of these various options.  After this conversation, patient wanted to proceed with pain management.  Referral provided to [facility] to her today -Patient should return to office on an as needed basis     Patient expressed understanding of the plan and all questions were answered to the patient's satisfaction.    ___________________________________________________________________________     History:   Patient is a 78 y.o. female who presents today for follow-up on her lumbar spine.  Patient continues to have low back pain that radiates into her bilateral lateral hips and anterior thighs.  She has not noticed any changes in her symptoms since she was last seen in the office.  She feels the pain is worse if she is standing or walking.  It gets better but does not go away if she is sitting.    Treatments tried: PT, tylenol , meloxicam , norco     Physical Exam:   General: no acute distress, appears stated age Neurologic: alert, answering questions appropriately, following commands Respiratory: unlabored breathing on room air, symmetric chest rise Psychiatric: appropriate affect, normal cadence to speech     MSK (spine):   -Strength exam                                                   Left                  Right EHL                              5/5                  5/5 TA                                 5/5                  5/5 GSC                             5/5                  5/5 Knee extension            5/5                  5/5 Hip flexion                    5/5                  5/5    -Sensory exam                           Sensation intact to light touch in L3-S1 nerve distributions of bilateral lower extremities   Imaging: XRs of the lumbar spine  from 02/07/2024 were independently reviewed and interpreted, showing laminectomy defect seen at L4/5.  Posterior instrumentation at L5 and S1.  No lucency seen around the screws.  There is an interbody device that appears well contained within the former L5/S1 disc space.  No significant adjacent segment disease seen.  No fracture or dislocation seen.  No evidence of instability on flexion/extension views.   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  No other significant stenosis seen.  There is posterior instrumentation and interbody device at L5/S1.  No evidence of hardware complication but MRI is not the best modality to evaluate.  No significant stenosis seen at the L5/S1 level.    Patient name: Stephanie Moreno Patient MRN: [id number] Date of visit: 03/17/24

## 2024-03-18 DIAGNOSIS — R2689 Other abnormalities of gait and mobility: Secondary | ICD-10-CM | POA: Diagnosis not present

## 2024-03-18 DIAGNOSIS — M25652 Stiffness of left hip, not elsewhere classified: Secondary | ICD-10-CM | POA: Diagnosis not present

## 2024-03-18 DIAGNOSIS — M2560 Stiffness of unspecified joint, not elsewhere classified: Secondary | ICD-10-CM | POA: Diagnosis not present

## 2024-03-18 DIAGNOSIS — M25552 Pain in left hip: Secondary | ICD-10-CM | POA: Diagnosis not present

## 2024-03-18 DIAGNOSIS — M6281 Muscle weakness (generalized): Secondary | ICD-10-CM | POA: Diagnosis not present

## 2024-03-18 DIAGNOSIS — M549 Dorsalgia, unspecified: Secondary | ICD-10-CM | POA: Diagnosis not present

## 2024-03-19 ENCOUNTER — Ambulatory Visit (HOSPITAL_COMMUNITY)

## 2024-03-20 ENCOUNTER — Other Ambulatory Visit: Payer: Self-pay | Admitting: Gastroenterology

## 2024-03-20 DIAGNOSIS — M549 Dorsalgia, unspecified: Secondary | ICD-10-CM | POA: Diagnosis not present

## 2024-03-20 DIAGNOSIS — M25652 Stiffness of left hip, not elsewhere classified: Secondary | ICD-10-CM | POA: Diagnosis not present

## 2024-03-20 DIAGNOSIS — R2689 Other abnormalities of gait and mobility: Secondary | ICD-10-CM | POA: Diagnosis not present

## 2024-03-20 DIAGNOSIS — M25552 Pain in left hip: Secondary | ICD-10-CM | POA: Diagnosis not present

## 2024-03-20 DIAGNOSIS — M6281 Muscle weakness (generalized): Secondary | ICD-10-CM | POA: Diagnosis not present

## 2024-03-20 DIAGNOSIS — M2560 Stiffness of unspecified joint, not elsewhere classified: Secondary | ICD-10-CM | POA: Diagnosis not present

## 2024-03-21 DIAGNOSIS — R1013 Epigastric pain: Secondary | ICD-10-CM | POA: Diagnosis not present

## 2024-03-21 DIAGNOSIS — D509 Iron deficiency anemia, unspecified: Secondary | ICD-10-CM | POA: Diagnosis not present

## 2024-03-22 LAB — COMPREHENSIVE METABOLIC PANEL WITH GFR
ALT: 19 IU/L (ref 0–32)
AST: 32 IU/L (ref 0–40)
Albumin: 4.3 g/dL (ref 3.8–4.8)
Alkaline Phosphatase: 110 IU/L (ref 49–135)
BUN/Creatinine Ratio: 13 (ref 12–28)
BUN: 18 mg/dL (ref 8–27)
Bilirubin Total: 0.3 mg/dL (ref 0.0–1.2)
CO2: 20 mmol/L (ref 20–29)
Calcium: 9.6 mg/dL (ref 8.7–10.3)
Chloride: 102 mmol/L (ref 96–106)
Creatinine, Ser: 1.38 mg/dL — ABNORMAL HIGH (ref 0.57–1.00)
Globulin, Total: 3.6 g/dL (ref 1.5–4.5)
Glucose: 124 mg/dL — ABNORMAL HIGH (ref 70–99)
Potassium: 4 mmol/L (ref 3.5–5.2)
Sodium: 140 mmol/L (ref 134–144)
Total Protein: 7.9 g/dL (ref 6.0–8.5)
eGFR: 39 mL/min/1.73 — ABNORMAL LOW (ref >59)

## 2024-03-22 LAB — CBC WITH DIFFERENTIAL/PLATELET
Basophils Absolute: 0.1 x10E3/uL (ref 0.0–0.2)
Basos: 1 %
EOS (ABSOLUTE): 0.1 x10E3/uL (ref 0.0–0.4)
Eos: 2 %
Hematocrit: 38.7 % (ref 34.0–46.6)
Hemoglobin: 12.4 g/dL (ref 11.1–15.9)
Immature Grans (Abs): 0 x10E3/uL (ref 0.0–0.1)
Immature Granulocytes: 0 %
Lymphocytes Absolute: 3.6 x10E3/uL — ABNORMAL HIGH (ref 0.7–3.1)
Lymphs: 46 %
MCH: 27.5 pg (ref 26.6–33.0)
MCHC: 32 g/dL (ref 31.5–35.7)
MCV: 86 fL (ref 79–97)
Monocytes Absolute: 0.5 x10E3/uL (ref 0.1–0.9)
Monocytes: 6 %
Neutrophils Absolute: 3.5 x10E3/uL (ref 1.4–7.0)
Neutrophils: 45 %
Platelets: 334 x10E3/uL (ref 150–450)
RBC: 4.51 x10E6/uL (ref 3.77–5.28)
RDW: 12.6 % (ref 11.7–15.4)
WBC: 7.8 x10E3/uL (ref 3.4–10.8)

## 2024-03-22 LAB — IRON,TIBC AND FERRITIN PANEL
Ferritin: 55 ng/mL (ref 15–150)
Iron Saturation: 19 % (ref 15–55)
Iron: 62 ug/dL (ref 27–139)
Total Iron Binding Capacity: 318 ug/dL (ref 250–450)
UIBC: 256 ug/dL (ref 118–369)

## 2024-03-25 ENCOUNTER — Ambulatory Visit (HOSPITAL_COMMUNITY)

## 2024-03-26 DIAGNOSIS — I10 Essential (primary) hypertension: Secondary | ICD-10-CM | POA: Diagnosis not present

## 2024-03-26 DIAGNOSIS — R2689 Other abnormalities of gait and mobility: Secondary | ICD-10-CM | POA: Diagnosis not present

## 2024-03-26 DIAGNOSIS — N1831 Chronic kidney disease, stage 3a: Secondary | ICD-10-CM | POA: Diagnosis not present

## 2024-03-26 DIAGNOSIS — M2560 Stiffness of unspecified joint, not elsewhere classified: Secondary | ICD-10-CM | POA: Diagnosis not present

## 2024-03-26 DIAGNOSIS — K5909 Other constipation: Secondary | ICD-10-CM | POA: Diagnosis not present

## 2024-03-26 DIAGNOSIS — M6281 Muscle weakness (generalized): Secondary | ICD-10-CM | POA: Diagnosis not present

## 2024-03-26 DIAGNOSIS — K219 Gastro-esophageal reflux disease without esophagitis: Secondary | ICD-10-CM | POA: Diagnosis not present

## 2024-03-26 DIAGNOSIS — E1165 Type 2 diabetes mellitus with hyperglycemia: Secondary | ICD-10-CM | POA: Diagnosis not present

## 2024-03-26 DIAGNOSIS — M5416 Radiculopathy, lumbar region: Secondary | ICD-10-CM | POA: Diagnosis not present

## 2024-03-26 DIAGNOSIS — E782 Mixed hyperlipidemia: Secondary | ICD-10-CM | POA: Diagnosis not present

## 2024-03-26 DIAGNOSIS — M25552 Pain in left hip: Secondary | ICD-10-CM | POA: Diagnosis not present

## 2024-03-26 DIAGNOSIS — M1990 Unspecified osteoarthritis, unspecified site: Secondary | ICD-10-CM | POA: Diagnosis not present

## 2024-03-26 DIAGNOSIS — D638 Anemia in other chronic diseases classified elsewhere: Secondary | ICD-10-CM | POA: Diagnosis not present

## 2024-03-26 DIAGNOSIS — M25652 Stiffness of left hip, not elsewhere classified: Secondary | ICD-10-CM | POA: Diagnosis not present

## 2024-03-26 DIAGNOSIS — J309 Allergic rhinitis, unspecified: Secondary | ICD-10-CM | POA: Diagnosis not present

## 2024-03-28 ENCOUNTER — Ambulatory Visit (HOSPITAL_COMMUNITY)
Admission: RE | Admit: 2024-03-28 | Discharge: 2024-03-28 | Disposition: A | Discharge status: Home / Self Care | Source: Ambulatory Visit | Attending: Gastroenterology | Admitting: Gastroenterology

## 2024-03-28 DIAGNOSIS — K8020 Calculus of gallbladder without cholecystitis without obstruction: Secondary | ICD-10-CM | POA: Diagnosis not present

## 2024-03-28 DIAGNOSIS — R1013 Epigastric pain: Secondary | ICD-10-CM | POA: Diagnosis not present

## 2024-03-28 DIAGNOSIS — N281 Cyst of kidney, acquired: Secondary | ICD-10-CM | POA: Diagnosis not present

## 2024-03-28 DIAGNOSIS — R109 Unspecified abdominal pain: Secondary | ICD-10-CM | POA: Diagnosis not present

## 2024-03-28 DIAGNOSIS — K7689 Other specified diseases of liver: Secondary | ICD-10-CM | POA: Diagnosis not present

## 2024-04-01 DIAGNOSIS — R2689 Other abnormalities of gait and mobility: Secondary | ICD-10-CM | POA: Diagnosis not present

## 2024-04-01 DIAGNOSIS — M2560 Stiffness of unspecified joint, not elsewhere classified: Secondary | ICD-10-CM | POA: Diagnosis not present

## 2024-04-01 DIAGNOSIS — M25652 Stiffness of left hip, not elsewhere classified: Secondary | ICD-10-CM | POA: Diagnosis not present

## 2024-04-01 DIAGNOSIS — M6281 Muscle weakness (generalized): Secondary | ICD-10-CM | POA: Diagnosis not present

## 2024-04-01 DIAGNOSIS — M549 Dorsalgia, unspecified: Secondary | ICD-10-CM | POA: Diagnosis not present

## 2024-04-01 DIAGNOSIS — M25552 Pain in left hip: Secondary | ICD-10-CM | POA: Diagnosis not present

## 2024-04-02 ENCOUNTER — Other Ambulatory Visit: Payer: Self-pay

## 2024-04-02 DIAGNOSIS — K5900 Constipation, unspecified: Secondary | ICD-10-CM

## 2024-04-02 MED ORDER — LUBIPROSTONE 8 MCG PO CAPS: 8.0000 ug | ORAL_CAPSULE | Freq: Two times a day (BID) | ORAL | 1 refills | Status: AC

## 2024-04-03 ENCOUNTER — Ambulatory Visit: Payer: Self-pay | Admitting: Gastroenterology

## 2024-04-04 DIAGNOSIS — M2560 Stiffness of unspecified joint, not elsewhere classified: Secondary | ICD-10-CM | POA: Diagnosis not present

## 2024-04-04 DIAGNOSIS — M6281 Muscle weakness (generalized): Secondary | ICD-10-CM | POA: Diagnosis not present

## 2024-04-04 DIAGNOSIS — M549 Dorsalgia, unspecified: Secondary | ICD-10-CM | POA: Diagnosis not present

## 2024-04-04 DIAGNOSIS — M25552 Pain in left hip: Secondary | ICD-10-CM | POA: Diagnosis not present

## 2024-04-04 DIAGNOSIS — R2689 Other abnormalities of gait and mobility: Secondary | ICD-10-CM | POA: Diagnosis not present

## 2024-04-04 DIAGNOSIS — M25652 Stiffness of left hip, not elsewhere classified: Secondary | ICD-10-CM | POA: Diagnosis not present

## 2024-04-07 ENCOUNTER — Other Ambulatory Visit: Admitting: *Deleted

## 2024-04-07 ENCOUNTER — Encounter: Payer: Self-pay | Admitting: *Deleted

## 2024-04-07 NOTE — Patient Instructions (Signed)
 Stephanie Moreno - I am sorry I was unable to reach you today  I work with [name], [name] PEDLAR, MD and am calling to support your healthcare needs. Please contact me at [phone] at your earliest convenience. I look forward to speaking with you soon.   Thank you,  [name], RN, BSN, CCM [place]  [facility], [facility] RN Care Manager

## 2024-04-07 NOTE — Patient Instructions (Signed)
 Visit Information  Thank you for taking time to visit with me today. Please don't hesitate to contact me if I can be of assistance to you before our next scheduled appointment.  Our next appointment is by telephone on 04/07/24 at 3:45 pm Please call the care guide team at [phone] if you need to cancel or reschedule your appointment.   Following is a copy of your care plan:   Goals Addressed             This Visit's Progress    VBCI RN Care Plan   No change    Problems:  Care Coordination needs related to Financial Strain  and Transportation  Goal: Over the next 3 months the Patient will continue to work with Medical illustrator and/or Social Worker to address care management and care coordination needs related to financial strain and transportation  as evidenced by adherence to care management team scheduled appointments      Interventions:   Evaluation of current treatment plan related to financial strain and transportation,  self-management and patient's adherence to plan as established by provider. Discussed plans with patient for ongoing care management follow up and provided patient with direct contact information for care management team Care Guide referral for  financial strain and transportation  Screening for signs and symptoms of depression related to chronic disease state  Assessed social determinant of health barriers  Patient Self-Care Activities:  Attend all scheduled provider appointments Call provider office for new concerns or questions  Perform all self care activities independently  Take medications as prescribed    Plan:  Telephone follow up appointment with care management team member scheduled for:  04/07/24 3:45 pm             Please call the Suicide and Crisis Lifeline: 988 call the [place]  National Suicide Prevention Lifeline: (909)630-3484 or TTY: (985)864-0271 TTY 339-302-7400) to talk to a trained counselor call 1-800-273-TALK (toll free, 24  hour hotline) call the [facility]: 7070524675 call 911 if you are experiencing a Mental Health or Behavioral Health Crisis or need someone to talk to.  Patient verbalizes understanding of instructions and care plan provided today and agrees to view in MyChart. Active MyChart status and patient understanding of how to access instructions and care plan via MyChart confirmed with patient.     Stephanie Odeh L. Ramonita, RN, BSN, CCM [place]  [facility], [facility] RN Care Manager Direct Dial: [phone]  Fax: [phone]

## 2024-04-09 DIAGNOSIS — D631 Anemia in chronic kidney disease: Secondary | ICD-10-CM | POA: Diagnosis not present

## 2024-04-09 DIAGNOSIS — R2689 Other abnormalities of gait and mobility: Secondary | ICD-10-CM | POA: Diagnosis not present

## 2024-04-09 DIAGNOSIS — R809 Proteinuria, unspecified: Secondary | ICD-10-CM | POA: Diagnosis not present

## 2024-04-09 DIAGNOSIS — M2560 Stiffness of unspecified joint, not elsewhere classified: Secondary | ICD-10-CM | POA: Diagnosis not present

## 2024-04-09 DIAGNOSIS — N189 Chronic kidney disease, unspecified: Secondary | ICD-10-CM | POA: Diagnosis not present

## 2024-04-09 DIAGNOSIS — M25652 Stiffness of left hip, not elsewhere classified: Secondary | ICD-10-CM | POA: Diagnosis not present

## 2024-04-09 DIAGNOSIS — M6281 Muscle weakness (generalized): Secondary | ICD-10-CM | POA: Diagnosis not present

## 2024-04-09 DIAGNOSIS — M549 Dorsalgia, unspecified: Secondary | ICD-10-CM | POA: Diagnosis not present

## 2024-04-09 DIAGNOSIS — M25552 Pain in left hip: Secondary | ICD-10-CM | POA: Diagnosis not present

## 2024-04-09 DIAGNOSIS — E211 Secondary hyperparathyroidism, not elsewhere classified: Secondary | ICD-10-CM | POA: Diagnosis not present

## 2024-04-09 DIAGNOSIS — D649 Anemia, unspecified: Secondary | ICD-10-CM | POA: Diagnosis not present

## 2024-04-09 NOTE — Patient Outreach (Signed)
 Error with opening encounter  Remie Mathison L. Ramonita, RN, BSN, CCM [place]  [facility], [facility] RN Care Manager Direct Dial: [phone]  Fax: [phone]

## 2024-04-11 ENCOUNTER — Other Ambulatory Visit: Payer: Self-pay

## 2024-04-11 DIAGNOSIS — R2689 Other abnormalities of gait and mobility: Secondary | ICD-10-CM | POA: Diagnosis not present

## 2024-04-11 DIAGNOSIS — M2560 Stiffness of unspecified joint, not elsewhere classified: Secondary | ICD-10-CM | POA: Diagnosis not present

## 2024-04-11 DIAGNOSIS — M549 Dorsalgia, unspecified: Secondary | ICD-10-CM | POA: Diagnosis not present

## 2024-04-11 DIAGNOSIS — M25552 Pain in left hip: Secondary | ICD-10-CM | POA: Diagnosis not present

## 2024-04-11 DIAGNOSIS — M25652 Stiffness of left hip, not elsewhere classified: Secondary | ICD-10-CM | POA: Diagnosis not present

## 2024-04-11 DIAGNOSIS — M6281 Muscle weakness (generalized): Secondary | ICD-10-CM | POA: Diagnosis not present

## 2024-04-11 NOTE — Patient Instructions (Signed)
 Visit Information  Thank you for taking time to visit with me today. Please don't hesitate to contact me if I can be of assistance to you before our next scheduled appointment.  Your next care management appointment is by telephone on 05-13-2024  at 1:00 PM   Telephone follow-up in 1 month  Please call the care guide team at [phone] if you need to cancel, schedule, or reschedule an appointment.   Please call the Suicide and Crisis Lifeline: 988 call the [place]  National Suicide Prevention Lifeline: 312-277-5493 or TTY: 773-107-3943 TTY 684-073-3004) to talk to a trained counselor call 1-800-273-TALK (toll free, 24 hour hotline) call the [facility]: [phone] call 911 if you are experiencing a Mental Health or Behavioral Health Crisis or need someone to talk to.  Hendricks Her RN, BSN  [place] [facility]Population [facility] RN Case Information systems manager [phone]

## 2024-04-13 DIAGNOSIS — M7582 Other shoulder lesions, left shoulder: Secondary | ICD-10-CM | POA: Diagnosis not present

## 2024-04-16 ENCOUNTER — Other Ambulatory Visit: Payer: Self-pay | Admitting: *Deleted

## 2024-04-16 ENCOUNTER — Telehealth: Payer: Self-pay | Admitting: Pharmacy Technician

## 2024-04-16 DIAGNOSIS — R2689 Other abnormalities of gait and mobility: Secondary | ICD-10-CM | POA: Diagnosis not present

## 2024-04-16 DIAGNOSIS — M549 Dorsalgia, unspecified: Secondary | ICD-10-CM | POA: Diagnosis not present

## 2024-04-16 DIAGNOSIS — J309 Allergic rhinitis, unspecified: Secondary | ICD-10-CM | POA: Diagnosis not present

## 2024-04-16 DIAGNOSIS — M25552 Pain in left hip: Secondary | ICD-10-CM | POA: Diagnosis not present

## 2024-04-16 DIAGNOSIS — M25652 Stiffness of left hip, not elsewhere classified: Secondary | ICD-10-CM | POA: Diagnosis not present

## 2024-04-16 DIAGNOSIS — K219 Gastro-esophageal reflux disease without esophagitis: Secondary | ICD-10-CM | POA: Diagnosis not present

## 2024-04-16 DIAGNOSIS — M1990 Unspecified osteoarthritis, unspecified site: Secondary | ICD-10-CM | POA: Diagnosis not present

## 2024-04-16 DIAGNOSIS — E1165 Type 2 diabetes mellitus with hyperglycemia: Secondary | ICD-10-CM | POA: Diagnosis not present

## 2024-04-16 DIAGNOSIS — I10 Essential (primary) hypertension: Secondary | ICD-10-CM | POA: Diagnosis not present

## 2024-04-16 DIAGNOSIS — E782 Mixed hyperlipidemia: Secondary | ICD-10-CM | POA: Diagnosis not present

## 2024-04-16 DIAGNOSIS — Z7984 Long term (current) use of oral hypoglycemic drugs: Secondary | ICD-10-CM

## 2024-04-16 DIAGNOSIS — K5909 Other constipation: Secondary | ICD-10-CM | POA: Diagnosis not present

## 2024-04-16 DIAGNOSIS — M5416 Radiculopathy, lumbar region: Secondary | ICD-10-CM | POA: Diagnosis not present

## 2024-04-16 DIAGNOSIS — E1122 Type 2 diabetes mellitus with diabetic chronic kidney disease: Secondary | ICD-10-CM

## 2024-04-16 DIAGNOSIS — D638 Anemia in other chronic diseases classified elsewhere: Secondary | ICD-10-CM | POA: Diagnosis not present

## 2024-04-16 DIAGNOSIS — Z794 Long term (current) use of insulin: Secondary | ICD-10-CM

## 2024-04-16 DIAGNOSIS — M2560 Stiffness of unspecified joint, not elsewhere classified: Secondary | ICD-10-CM | POA: Diagnosis not present

## 2024-04-16 DIAGNOSIS — N1831 Chronic kidney disease, stage 3a: Secondary | ICD-10-CM | POA: Diagnosis not present

## 2024-04-16 DIAGNOSIS — M6281 Muscle weakness (generalized): Secondary | ICD-10-CM | POA: Diagnosis not present

## 2024-04-16 MED ORDER — BD PEN NEEDLE MICRO ULTRAFINE 32G X 6 MM MISC: 5 refills | Status: AC

## 2024-04-16 NOTE — Telephone Encounter (Signed)
 Pharmacy Patient Advocate Encounter   Received notification from CoverMyMeds that prior authorization for Insulin  Glargine-yfgn 100UNIT/ML pen-injectors is required/requested.   Insurance verification completed.   The patient is insured through [facility].  Action: Medication has been discontinued. Archived Key: [id number]

## 2024-04-17 DIAGNOSIS — N1832 Chronic kidney disease, stage 3b: Secondary | ICD-10-CM | POA: Diagnosis not present

## 2024-04-17 DIAGNOSIS — R809 Proteinuria, unspecified: Secondary | ICD-10-CM | POA: Diagnosis not present

## 2024-04-17 DIAGNOSIS — E876 Hypokalemia: Secondary | ICD-10-CM | POA: Diagnosis not present

## 2024-04-17 DIAGNOSIS — E1129 Type 2 diabetes mellitus with other diabetic kidney complication: Secondary | ICD-10-CM | POA: Diagnosis not present

## 2024-04-22 DIAGNOSIS — I10 Essential (primary) hypertension: Secondary | ICD-10-CM | POA: Diagnosis not present

## 2024-04-22 DIAGNOSIS — K5909 Other constipation: Secondary | ICD-10-CM | POA: Diagnosis not present

## 2024-04-22 DIAGNOSIS — M79674 Pain in right toe(s): Secondary | ICD-10-CM | POA: Diagnosis not present

## 2024-04-22 DIAGNOSIS — N1831 Chronic kidney disease, stage 3a: Secondary | ICD-10-CM | POA: Diagnosis not present

## 2024-04-22 DIAGNOSIS — J309 Allergic rhinitis, unspecified: Secondary | ICD-10-CM | POA: Diagnosis not present

## 2024-04-22 DIAGNOSIS — E782 Mixed hyperlipidemia: Secondary | ICD-10-CM | POA: Diagnosis not present

## 2024-04-22 DIAGNOSIS — E1165 Type 2 diabetes mellitus with hyperglycemia: Secondary | ICD-10-CM | POA: Diagnosis not present

## 2024-04-22 DIAGNOSIS — E1140 Type 2 diabetes mellitus with diabetic neuropathy, unspecified: Secondary | ICD-10-CM | POA: Diagnosis not present

## 2024-04-22 DIAGNOSIS — D638 Anemia in other chronic diseases classified elsewhere: Secondary | ICD-10-CM | POA: Diagnosis not present

## 2024-04-22 DIAGNOSIS — K219 Gastro-esophageal reflux disease without esophagitis: Secondary | ICD-10-CM | POA: Diagnosis not present

## 2024-04-22 DIAGNOSIS — M79675 Pain in left toe(s): Secondary | ICD-10-CM | POA: Diagnosis not present

## 2024-04-22 DIAGNOSIS — M25552 Pain in left hip: Secondary | ICD-10-CM | POA: Diagnosis not present

## 2024-04-22 DIAGNOSIS — I739 Peripheral vascular disease, unspecified: Secondary | ICD-10-CM | POA: Diagnosis not present

## 2024-04-22 DIAGNOSIS — M1990 Unspecified osteoarthritis, unspecified site: Secondary | ICD-10-CM | POA: Diagnosis not present

## 2024-04-22 DIAGNOSIS — M79672 Pain in left foot: Secondary | ICD-10-CM | POA: Diagnosis not present

## 2024-04-22 DIAGNOSIS — M5416 Radiculopathy, lumbar region: Secondary | ICD-10-CM | POA: Diagnosis not present

## 2024-04-22 DIAGNOSIS — M79671 Pain in right foot: Secondary | ICD-10-CM | POA: Diagnosis not present

## 2024-04-22 DIAGNOSIS — L110 Acquired keratosis follicularis: Secondary | ICD-10-CM | POA: Diagnosis not present

## 2024-04-23 ENCOUNTER — Other Ambulatory Visit: Payer: Self-pay | Admitting: Nurse Practitioner

## 2024-04-30 ENCOUNTER — Ambulatory Visit: Admitting: Gastroenterology

## 2024-05-01 DIAGNOSIS — M6281 Muscle weakness (generalized): Secondary | ICD-10-CM | POA: Diagnosis not present

## 2024-05-01 DIAGNOSIS — M25552 Pain in left hip: Secondary | ICD-10-CM | POA: Diagnosis not present

## 2024-05-01 DIAGNOSIS — M549 Dorsalgia, unspecified: Secondary | ICD-10-CM | POA: Diagnosis not present

## 2024-05-01 DIAGNOSIS — R2689 Other abnormalities of gait and mobility: Secondary | ICD-10-CM | POA: Diagnosis not present

## 2024-05-01 DIAGNOSIS — M2560 Stiffness of unspecified joint, not elsewhere classified: Secondary | ICD-10-CM | POA: Diagnosis not present

## 2024-05-01 DIAGNOSIS — M25652 Stiffness of left hip, not elsewhere classified: Secondary | ICD-10-CM | POA: Diagnosis not present

## 2024-05-05 ENCOUNTER — Encounter: Payer: Self-pay | Admitting: Radiology

## 2024-05-12 ENCOUNTER — Ambulatory Visit: Admitting: Nurse Practitioner

## 2024-05-13 ENCOUNTER — Telehealth: Payer: Self-pay

## 2024-05-13 NOTE — Patient Instructions (Signed)
 Ronal LITTIE Kobus - I am sorry I was unable to reach you today for our scheduled appointment. I work with [name], [name] PEDLAR, MD and am calling to support your healthcare needs. Please contact me at [phone] at your earliest convenience. I look forward to speaking with you soon.   Thank you,  Hendricks Her RN, BSN  [place] [facility]Population [facility] RN Case Manager   Direct [phone]

## 2024-05-15 ENCOUNTER — Telehealth: Payer: Self-pay

## 2024-05-15 NOTE — Patient Instructions (Signed)
 Ronal LITTIE Kobus - I am sorry I was unable to reach you today for our scheduled appointment. I work with [name], [name] PEDLAR, MD and am calling to support your healthcare needs. Please contact me at [phone] at your earliest convenience. I look forward to speaking with you soon.   Thank you,  Hendricks Her RN, BSN  [place] [facility]Population [facility] RN Case Manager   Direct [phone]

## 2024-05-16 ENCOUNTER — Other Ambulatory Visit: Payer: Self-pay | Admitting: Nurse Practitioner

## 2024-05-20 ENCOUNTER — Telehealth: Payer: Self-pay

## 2024-05-20 NOTE — Patient Instructions (Signed)
 Ronal LITTIE Kobus - I have attempted to call you three times but have been unsuccessful in reaching you. I work with [name], [name] PEDLAR, MD and am calling to support your healthcare needs. If I can be of assistance to you, please contact me at [phone].     Thank you,  Hendricks Her RN, BSN  [place] [facility]Population [facility] RN Case Manager   Direct [phone]

## 2024-06-19 ENCOUNTER — Other Ambulatory Visit: Payer: Self-pay | Admitting: Gastroenterology

## 2024-07-02 ENCOUNTER — Telehealth (INDEPENDENT_AMBULATORY_CARE_PROVIDER_SITE_OTHER): Payer: Self-pay

## 2024-07-02 ENCOUNTER — Encounter: Payer: Self-pay | Admitting: Gastroenterology

## 2024-07-02 ENCOUNTER — Ambulatory Visit: Admitting: Cardiology

## 2024-07-02 ENCOUNTER — Ambulatory Visit (INDEPENDENT_AMBULATORY_CARE_PROVIDER_SITE_OTHER): Admitting: Gastroenterology

## 2024-07-02 VITALS — BP 157/84 | HR 76 | Temp 97.4°F | Ht 63.0 in | Wt 173.0 lb

## 2024-07-02 DIAGNOSIS — Z862 Personal history of diseases of the blood and blood-forming organs and certain disorders involving the immune mechanism: Secondary | ICD-10-CM

## 2024-07-02 DIAGNOSIS — K219 Gastro-esophageal reflux disease without esophagitis: Secondary | ICD-10-CM

## 2024-07-02 DIAGNOSIS — K5900 Constipation, unspecified: Secondary | ICD-10-CM

## 2024-07-02 DIAGNOSIS — R1310 Dysphagia, unspecified: Secondary | ICD-10-CM | POA: Diagnosis not present

## 2024-07-02 DIAGNOSIS — D508 Other iron deficiency anemias: Secondary | ICD-10-CM

## 2024-07-02 MED ORDER — LUBIPROSTONE 24 MCG PO CAPS: 24.0000 ug | ORAL_CAPSULE | Freq: Two times a day (BID) | ORAL | 3 refills | Status: AC

## 2024-07-02 MED ORDER — OMEPRAZOLE 40 MG PO CPDR: 40.0000 mg | DELAYED_RELEASE_CAPSULE | Freq: Two times a day (BID) | ORAL | 2 refills | Status: AC

## 2024-07-02 NOTE — H&P (View-Only) (Signed)
 "   Gastroenterology Office Note     Primary Care Physician:  [name] [name] PEDLAR, MD  Primary Gastroenterologist: Dr Cindie   Chief Complaint   Chief Complaint  Patient presents with   Follow-up    Pt arrives for follow up on GERD. Pt also states provider wants to talk about EGD and possible TCS. Still having issues with GERD. Pt is taking Omeprazole .      History of Present Illness   Stephanie Moreno is a 78 y.o. female presenting today with a history of abdominal pain and nausea, constipation, GERD, anemia multifactorial with chronic disease and IDA, previously established with GI in [place]  but moved to this area in June 2021, last seen in Sept 2025 and returning for follow-up of GERD, constipation.   Recent labs reviewed Sept 2025 with Hgb 12.4, improved from prior. Ferritin 55, down from 207 in 2022. Iron  sats 19. Outside labs 06/03/24: Hgb 12.4, A1c 7.6 Outsisde labs Oct 2025: Ferritin 50, iron  64, sats 21  Constipation managed in past on Amitiza . Taking 8 mcg po BID. Believes she needs to increase this dosage. Having BM once every other day. Has associated abdominal cramping but then improved s/p defecation.   GERD : taking omeprazole  40 mg once daily. Been on this chronically. Has been on pantoprazole  in the past. Feels she is having breakthrough GERD. Inadvertently was taking pantoprazole  and omeprazole  at the same time in the past and GERD was less noticeable. We had decreased this to just omeprazole . She feels she needs stronger dose.   States food is stopping in upper abdomen. Wants to vomit but can't. Feels getting stuck under sternum. Cold sweats when this happens. Trying to vomit this up.   States she may have seen black tarry stool once in awhile, possibly a few weeks ago was last time. No iron  or pepto. No NSAIDs  Irregular heart rate noted earlier this year, seeing cardiology to establish care Feb 2026. No chest pain or shortness of breath.    Oct 2023: small hiatal  hernia, mild Schatzki ring, s/p dilation. Gastritis s/p biopsy. Negative H.pylori.     Colonoscopy 2021: internal hemorrhoids, diverticula   Capsule 2021: small bowel mucosa with few nonbleeding erosions proximally, single polypoid type lesion with normal mucosa reviewed with Dr. Shaaron who felt insignificant.   Past Medical History:  Diagnosis Date   Acid reflux disease    Anemia    Anxiety    Asthma    Breast cancer (HCC)    left side   Chronic back pain    Chronic kidney disease    Depression    Diabetes mellitus without complication (HCC)    Diverticulitis    Hypertension    Lumbosacral spondylosis    Per previous PCP records   Sleep apnea    Thyroid  disease     Past Surgical History:  Procedure Laterality Date   ABDOMINAL HYSTERECTOMY     BACK SURGERY     BALLOON DILATION N/A 04/24/2022   Procedure: BALLOON DILATION;  Surgeon: Cindie Carlin POUR, DO;  Location: [facility];  Service: Endoscopy;  Laterality: N/A;   BIOPSY  02/19/2020   Procedure: BIOPSY;  Surgeon: Cindie Carlin POUR, DO;  Location: [facility];  Service: Endoscopy;;   BIOPSY  04/24/2022   Procedure: BIOPSY;  Surgeon: Cindie Carlin POUR, DO;  Location: [facility];  Service: Endoscopy;;   BREAST BIOPSY Right    x 3   BREAST LUMPECTOMY Left 2003  COLONOSCOPY WITH PROPOFOL  N/A 02/19/2020   non-bleeding internal hemorrhoids, many small-mouthed diverticula in entire colon.   ESOPHAGOGASTRODUODENOSCOPY (EGD) WITH PROPOFOL  N/A 02/19/2020    benign-appearing esophageal stenosis s/p dilation, gastritis s/p biopsy, normal duodenum. Negative H.pylori, negative duodenal biopsy.    ESOPHAGOGASTRODUODENOSCOPY (EGD) WITH PROPOFOL  N/A 04/24/2022   small hiatal hernia, mild Schatzki ring, s/p dilation. Gastritis s/p biopsy. Negative H.pylori.   GIVENS CAPSULE STUDY N/A 06/14/2020   Procedure: GIVENS CAPSULE STUDY;  Surgeon: Cindie Carlin POUR, DO;  Location: [facility];  Service: Endoscopy;  Laterality: N/A;   7:30am   THYROIDECTOMY     tummy tuck     at time of breast surgery 2003    Current Outpatient Medications  Medication Sig Dispense Refill   acetaminophen  (TYLENOL ) 325 MG tablet Take 650 mg by mouth every 4 (four) hours as needed for mild pain (pain score 1-3).     amLODipine  (NORVASC ) 10 MG tablet Take 5 mg by mouth daily.     Azelastine HCl 137 MCG/SPRAY SOLN Place 2 sprays into both nostrils 2 (two) times daily.     Blood Glucose Monitoring Suppl (ONETOUCH VERIO) w/Device KIT Use to check glucose twice daily, use as backup to CGM 1 kit 0   Continuous Glucose Receiver (FREESTYLE LIBRE 2 READER) DEVI USE TO CHECK GLUCOSE AS DIRECTED 1 each 0   Continuous Glucose Receiver (FREESTYLE LIBRE 3 READER) DEVI Use the Reader to monitor your blood sugar readings daily 1 each 0   Continuous Glucose Sensor (FREESTYLE LIBRE 3 PLUS SENSOR) MISC Change sensor every 15 days. 2 each 6   Continuous Glucose Sensor (FREESTYLE LIBRE 3 PLUS SENSOR) MISC Change sensor every 15 days as directed to monitor blood sugar. 6 each 1   Continuous Glucose Sensor (FREESTYLE LIBRE 3 PLUS SENSOR) MISC USE TO TEST BLOOD SUGAR AS DIRECTED; CHANGE SENSOR EVERY 15 DAYS     Dulaglutide  (TRULICITY ) 3 MG/0.5ML SOAJ INJECT 3MG  SUBCUTANEOUSLY ONCE WEEKLY 6 mL 0   fluticasone  (FLOVENT  HFA) 220 MCG/ACT inhaler Inhale 1 puff into the lungs 2 (two) times daily.     fluticasone  furoate-vilanterol (BREO ELLIPTA ) 100-25 MCG/ACT AEPB Inhale 1 puff into the lungs daily. 90 each 3   furosemide  (LASIX ) 20 MG tablet Take 1 tablet (20 mg total) by mouth every other day. Takes 40 mg every other day. On alternate days take 80 mg     hydrALAZINE (APRESOLINE) 50 MG tablet Take 50 mg by mouth 2 (two) times daily.     HYDROcodone -acetaminophen  (NORCO) 7.5-325 MG tablet Take 1 tablet by mouth 2 (two) times daily as needed for moderate pain (pain score 4-6). 30 tablet 0   Insulin  Pen Needle (BD PEN NEEDLE MICRO ULTRAFINE) 32G X 6 MM MISC Use 1(one) Pen  Needle at bedtime to administer insulin  as directed by provider. 100 each 5   LANTUS  SOLOSTAR 100 UNIT/ML Solostar Pen SMARTSIG:35 Unit(s) SUB-Q Every Night (Patient taking differently: 12 Units. Taking 12 units)     levocetirizine (XYZAL ) 5 MG tablet TAKE ONE TABLET BY MOUTH EVERY EVENING AS NEEDED for allergies 150 tablet 0   lidocaine  (LIDODERM ) 5 % Place 1 patch onto the skin daily as needed (pain).     losartan  (COZAAR ) 50 MG tablet Take 50 mg by mouth daily.     lubiprostone  (AMITIZA ) 8 MCG capsule Take 1 capsule (8 mcg total) by mouth 2 (two) times daily with a meal. 180 capsule 1   MYRBETRIQ  50 MG TB24 tablet TAKE 1 TABLET BY  MOUTH EVERY DAY AT 9AM 90 tablet 3   omeprazole  (PRILOSEC) 40 MG capsule Take 1 capsule (40 mg total) by mouth daily before breakfast. 90 capsule 3   ondansetron  (ZOFRAN ) 4 MG tablet Take 1 tablet (4 mg total) by mouth every 8 (eight) hours as needed for nausea or vomiting. 30 tablet 1   OVER THE COUNTER MEDICATION Prevagine one per day for memory.     rosuvastatin  (CRESTOR ) 10 MG tablet Take 10 mg by mouth daily.     sucralfate  (CARAFATE ) 1 g tablet TAKE ONE TABLET BY MOUTH FOUR TIMES DAILY WITH MEALS AND AT BEDTIME 120 tablet 0   Ipratropium-Albuterol  (COMBIVENT  RESPIMAT) 20-100 MCG/ACT AERS respimat Inhale 1 puff into the lungs every 6 (six) hours as needed. (Patient not taking: Reported on 07/02/2024) 4 g 1   No current facility-administered medications for this visit.    Allergies as of 07/02/2024 - Review Complete 07/02/2024  Allergen Reaction Noted   Codeine Anaphylaxis, Nausea And Vomiting, and Nausea Only 12/18/2019   Lisinopril Nausea And Vomiting and Nausea Only 12/18/2019   Penicillins Hives and Rash 01/05/2020   Carvedilol  09/16/2021   Hydrochlorothiazide Other (See Comments) 12/18/2019   Oxybutynin Other (See Comments) 12/18/2019    Family History  Problem Relation Age of Onset   Diabetes Mother    Thyroid  disease Mother    Colon cancer Neg  Hx     Social History   Socioeconomic History   Marital status: Divorced    Spouse name: Not on file   Number of children: Not on file   Years of education: Not on file   Highest education level: Not on file  Occupational History   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 [place]   Tobacco Use   Smoking status: Never   Smokeless tobacco: Never  Vaping Use   Vaping status: Never Used  Substance and Sexual Activity   Alcohol use: Not Currently   Drug use: Not Currently   Sexual activity: Not on file  Other Topics Concern   Not on file  Social History Narrative   Not on file   Social Drivers of Health   Tobacco Use: Low Risk (07/02/2024)   Patient History    Smoking Tobacco Use: Never    Smokeless Tobacco Use: Never    Passive Exposure: Not on file  Financial Resource Strain: High Risk (03/11/2024)   Overall Financial Resource Strain (CARDIA)    Difficulty of Paying Living Expenses: Hard  Food Insecurity: No Food Insecurity (04/11/2024)   Epic    Worried About Programme Researcher, Broadcasting/film/video in the Last Year: Never true    Ran Out of Food in the Last Year: Never true  Transportation Needs: Unmet Transportation Needs (04/11/2024)   Epic    Lack of Transportation (Medical): Yes    Lack of Transportation (Non-Medical): No  Physical Activity: Not on file  Stress: Not on file  Social Connections: Moderately Integrated (08/28/2023)   Social Connection and Isolation Panel    Frequency of Communication with Friends and Family: Three times a week    Frequency of Social Gatherings with Friends and Family: Once a week    Attends Religious Services: More than 4 times per year    Active Member of [employer] or Organizations: No    Attends Engineer, Structural: More than 4 times per year    Marital Status: Divorced  Intimate Partner Violence: Not At Risk (04/11/2024)   Epic  Fear of Current or Ex-Partner: No    Emotionally Abused: No    Physically Abused: No     Sexually Abused: No  Depression (PHQ2-9): Low Risk (04/11/2024)   Depression (PHQ2-9)    PHQ-2 Score: 1  Alcohol Screen: Not on file  Housing: Low Risk (04/11/2024)   Epic    Unable to Pay for Housing in the Last Year: No    Number of Times Moved in the Last Year: 0    Homeless in the Last Year: No  Utilities: Not At Risk (04/11/2024)   Epic    Threatened with loss of utilities: No  Health Literacy: Not on file     Review of Systems   Gen: Denies any fever, chills, fatigue, weight loss, lack of appetite.  CV: Denies chest pain, heart palpitations, peripheral edema, syncope.  Resp: Denies shortness of breath at rest or with exertion. Denies wheezing or cough.  GI: see HPI GU : Denies urinary burning, urinary frequency, urinary hesitancy MS: Denies joint pain, muscle weakness, cramps, or limitation of movement.  Derm: Denies rash, itching, dry skin Psych: Denies depression, anxiety, memory loss, and confusion Heme: Denies bruising, bleeding, and enlarged lymph nodes.   Physical Exam   BP (!) 157/84   Pulse 76   Temp (!) 97.4 F (36.3 C)   Ht 5' 3 (1.6 m)   Wt 173 lb (78.5 kg)   BMI 30.65 kg/m  General:   Alert and oriented. Pleasant and cooperative. Well-nourished and well-developed.  Head:  Normocephalic and atraumatic. Eyes:  Without icterus Cardiac: regular rate Lungs: clear bilaterally Abdomen:  +BS, soft, non-tender and non-distended. No HSM noted. No guarding or rebound. No masses appreciated.  Rectal:  Deferred  Msk:  Symmetrical without gross deformities. Normal posture. Extremities:  Without edema. Neurologic:  Alert and  oriented x4 Skin:  Intact without significant lesions or rashes. Psych:  Alert and cooperative. Normal mood and affect.  Outside labs 06/03/24: Hgb 12.4, A1c 7.6 Outsisde labs Oct 2025: Ferritin 50, iron  64, sats 21  Assessment   Stephanie Moreno is a 79 y.o. female presenting today with a history of abdominal pain and nausea,  constipation, GERD, anemia multifactorial with chronic disease and IDA, previously established with GI in [place]  but moved to this area in June 2021, last seen in Sept 2025 and returning for follow-up of GERD, constipation, dealing now with dysphagia and GERD exacerbation along with constipation.  Dysphagia: prior dilation in 2023. Suspect needs dilation again. GERD also flaring with once daily PPI and will increase this to BID for now.   Constipation: increase Amitiza  to 24 mcg po BID. No overt hematochezia.  Hx of IDA: ferritin trending down, now 50, previously 207 a few years ago. Known hx of IDA with prior work-up as above. Holding on colonoscopy until she has EGD completed.   Hx of palpitations/irregular heart rate: noted at last visit. Referred to cardiology. No alarm signs/symptoms. Regular rate today with auscultation.       PLAN    Increase Amitiza  to 24 mcg po BID Increase omeprazole  to BID  EGD with dilation in near future Will follow-up on iron  studies again at next visit: if decreasing ferritin, will update colonoscopy. Hgb remaining stable/normal currently 2 months follow-up   Therisa MICAEL Stager, PhD, ANP-BC [facility]    "

## 2024-07-02 NOTE — Patient Instructions (Signed)
 I have increased omeprazole  to twice a day, 30 minutes before breakfast and dinner for now.  I have also increased Amitiza  to 24 mcg twice a day with food. You will stop the 8 microgram doses.  We are arranging an upper endoscopy with dilation by Dr. Cindie in the near future!  Please take 1/2 dose of Lantus  the evening before the procedure, and you will hold Trulicity  for one week prior.  Will see you in 2 months!   Have a wonderful New Year!  I enjoyed seeing you again today! I value our relationship and want to provide genuine, compassionate, and quality care. You may receive a survey regarding your visit with me, and I welcome your feedback! Thanks so much for taking the time to complete this. I look forward to seeing you again.      Therisa MICAEL Stager, PhD, ANP-BC [facility]

## 2024-07-02 NOTE — Telephone Encounter (Signed)
 Spoke with patient in the office, scheduled EGD/DIL for 07/11/2024 at 1:30pm. Instructions given in person.

## 2024-07-02 NOTE — Telephone Encounter (Signed)
 PA on [facility] for EGD/DIL: Notification or Prior Authorization is not required for the requested services You are not required to submit a notification/prior authorization based on the information provided. Decision ID #: [id number]

## 2024-07-02 NOTE — Progress Notes (Signed)
 "   Gastroenterology Office Note     Primary Care Physician:  [name] [name] PEDLAR, MD  Primary Gastroenterologist: Dr Cindie   Chief Complaint   Chief Complaint  Patient presents with   Follow-up    Pt arrives for follow up on GERD. Pt also states provider wants to talk about EGD and possible TCS. Still having issues with GERD. Pt is taking Omeprazole .      History of Present Illness   Stephanie Moreno is a 78 y.o. female presenting today with a history of abdominal pain and nausea, constipation, GERD, anemia multifactorial with chronic disease and IDA, previously established with GI in [place]  but moved to this area in June 2021, last seen in Sept 2025 and returning for follow-up of GERD, constipation.   Recent labs reviewed Sept 2025 with Hgb 12.4, improved from prior. Ferritin 55, down from 207 in 2022. Iron  sats 19. Outside labs 06/03/24: Hgb 12.4, A1c 7.6 Outsisde labs Oct 2025: Ferritin 50, iron  64, sats 21  Constipation managed in past on Amitiza . Taking 8 mcg po BID. Believes she needs to increase this dosage. Having BM once every other day. Has associated abdominal cramping but then improved s/p defecation.   GERD : taking omeprazole  40 mg once daily. Been on this chronically. Has been on pantoprazole  in the past. Feels she is having breakthrough GERD. Inadvertently was taking pantoprazole  and omeprazole  at the same time in the past and GERD was less noticeable. We had decreased this to just omeprazole . She feels she needs stronger dose.   States food is stopping in upper abdomen. Wants to vomit but can't. Feels getting stuck under sternum. Cold sweats when this happens. Trying to vomit this up.   States she may have seen black tarry stool once in awhile, possibly a few weeks ago was last time. No iron  or pepto. No NSAIDs  Irregular heart rate noted earlier this year, seeing cardiology to establish care Feb 2026. No chest pain or shortness of breath.    Oct 2023: small hiatal  hernia, mild Schatzki ring, s/p dilation. Gastritis s/p biopsy. Negative H.pylori.     Colonoscopy 2021: internal hemorrhoids, diverticula   Capsule 2021: small bowel mucosa with few nonbleeding erosions proximally, single polypoid type lesion with normal mucosa reviewed with Dr. Shaaron who felt insignificant.   Past Medical History:  Diagnosis Date   Acid reflux disease    Anemia    Anxiety    Asthma    Breast cancer (HCC)    left side   Chronic back pain    Chronic kidney disease    Depression    Diabetes mellitus without complication (HCC)    Diverticulitis    Hypertension    Lumbosacral spondylosis    Per previous PCP records   Sleep apnea    Thyroid  disease     Past Surgical History:  Procedure Laterality Date   ABDOMINAL HYSTERECTOMY     BACK SURGERY     BALLOON DILATION N/A 04/24/2022   Procedure: BALLOON DILATION;  Surgeon: Cindie Carlin POUR, DO;  [facility];  Service: Endoscopy;  Laterality: N/A;   BIOPSY  02/19/2020   Procedure: BIOPSY;  Surgeon: Cindie Carlin POUR, DO;  [facility]: [facility];  Service: Endoscopy;;   BIOPSY  04/24/2022   Procedure: BIOPSY;  Surgeon: Cindie Carlin POUR, DO;  [facility]: [facility];  Service: Endoscopy;;   BREAST BIOPSY Right    x 3   BREAST LUMPECTOMY Left 2003  COLONOSCOPY WITH PROPOFOL  N/A 02/19/2020   non-bleeding internal hemorrhoids, many small-mouthed diverticula in entire colon.   ESOPHAGOGASTRODUODENOSCOPY (EGD) WITH PROPOFOL  N/A 02/19/2020    benign-appearing esophageal stenosis s/p dilation, gastritis s/p biopsy, normal duodenum. Negative H.pylori, negative duodenal biopsy.    ESOPHAGOGASTRODUODENOSCOPY (EGD) WITH PROPOFOL  N/A 04/24/2022   small hiatal hernia, mild Schatzki ring, s/p dilation. Gastritis s/p biopsy. Negative H.pylori.   GIVENS CAPSULE STUDY N/A 06/14/2020   Procedure: GIVENS CAPSULE STUDY;  Surgeon: Cindie Carlin POUR, DO;  Location: [facility];  Service: Endoscopy;  Laterality: N/A;   7:30am   THYROIDECTOMY     tummy tuck     at time of breast surgery 2003    Current Outpatient Medications  Medication Sig Dispense Refill   acetaminophen  (TYLENOL ) 325 MG tablet Take 650 mg by mouth every 4 (four) hours as needed for mild pain (pain score 1-3).     amLODipine  (NORVASC ) 10 MG tablet Take 5 mg by mouth daily.     Azelastine HCl 137 MCG/SPRAY SOLN Place 2 sprays into both nostrils 2 (two) times daily.     Blood Glucose Monitoring Suppl (ONETOUCH VERIO) w/Device KIT Use to check glucose twice daily, use as backup to CGM 1 kit 0   Continuous Glucose Receiver (FREESTYLE LIBRE 2 READER) DEVI USE TO CHECK GLUCOSE AS DIRECTED 1 each 0   Continuous Glucose Receiver (FREESTYLE LIBRE 3 READER) DEVI Use the Reader to monitor your blood sugar readings daily 1 each 0   Continuous Glucose Sensor (FREESTYLE LIBRE 3 PLUS SENSOR) MISC Change sensor every 15 days. 2 each 6   Continuous Glucose Sensor (FREESTYLE LIBRE 3 PLUS SENSOR) MISC Change sensor every 15 days as directed to monitor blood sugar. 6 each 1   Continuous Glucose Sensor (FREESTYLE LIBRE 3 PLUS SENSOR) MISC USE TO TEST BLOOD SUGAR AS DIRECTED; CHANGE SENSOR EVERY 15 DAYS     Dulaglutide  (TRULICITY ) 3 MG/0.5ML SOAJ INJECT 3MG  SUBCUTANEOUSLY ONCE WEEKLY 6 mL 0   fluticasone  (FLOVENT  HFA) 220 MCG/ACT inhaler Inhale 1 puff into the lungs 2 (two) times daily.     fluticasone  furoate-vilanterol (BREO ELLIPTA ) 100-25 MCG/ACT AEPB Inhale 1 puff into the lungs daily. 90 each 3   furosemide  (LASIX ) 20 MG tablet Take 1 tablet (20 mg total) by mouth every other day. Takes 40 mg every other day. On alternate days take 80 mg     hydrALAZINE (APRESOLINE) 50 MG tablet Take 50 mg by mouth 2 (two) times daily.     HYDROcodone -acetaminophen  (NORCO) 7.5-325 MG tablet Take 1 tablet by mouth 2 (two) times daily as needed for moderate pain (pain score 4-6). 30 tablet 0   Insulin  Pen Needle (BD PEN NEEDLE MICRO ULTRAFINE) 32G X 6 MM MISC Use 1(one) Pen  Needle at bedtime to administer insulin  as directed by provider. 100 each 5   LANTUS  SOLOSTAR 100 UNIT/ML Solostar Pen SMARTSIG:35 Unit(s) SUB-Q Every Night (Patient taking differently: 12 Units. Taking 12 units)     levocetirizine (XYZAL ) 5 MG tablet TAKE ONE TABLET BY MOUTH EVERY EVENING AS NEEDED for allergies 150 tablet 0   lidocaine  (LIDODERM ) 5 % Place 1 patch onto the skin daily as needed (pain).     losartan  (COZAAR ) 50 MG tablet Take 50 mg by mouth daily.     lubiprostone  (AMITIZA ) 8 MCG capsule Take 1 capsule (8 mcg total) by mouth 2 (two) times daily with a meal. 180 capsule 1   MYRBETRIQ  50 MG TB24 tablet TAKE 1 TABLET BY  MOUTH EVERY DAY AT 9AM 90 tablet 3   omeprazole  (PRILOSEC) 40 MG capsule Take 1 capsule (40 mg total) by mouth daily before breakfast. 90 capsule 3   ondansetron  (ZOFRAN ) 4 MG tablet Take 1 tablet (4 mg total) by mouth every 8 (eight) hours as needed for nausea or vomiting. 30 tablet 1   OVER THE COUNTER MEDICATION Prevagine one per day for memory.     rosuvastatin  (CRESTOR ) 10 MG tablet Take 10 mg by mouth daily.     sucralfate  (CARAFATE ) 1 g tablet TAKE ONE TABLET BY MOUTH FOUR TIMES DAILY WITH MEALS AND AT BEDTIME 120 tablet 0   Ipratropium-Albuterol  (COMBIVENT  RESPIMAT) 20-100 MCG/ACT AERS respimat Inhale 1 puff into the lungs every 6 (six) hours as needed. (Patient not taking: Reported on 07/02/2024) 4 g 1   No current facility-administered medications for this visit.    Allergies as of 07/02/2024 - Review Complete 07/02/2024  Allergen Reaction Noted   Codeine Anaphylaxis, Nausea And Vomiting, and Nausea Only 12/18/2019   Lisinopril Nausea And Vomiting and Nausea Only 12/18/2019   Penicillins Hives and Rash 01/05/2020   Carvedilol  09/16/2021   Hydrochlorothiazide Other (See Comments) 12/18/2019   Oxybutynin Other (See Comments) 12/18/2019    Family History  Problem Relation Age of Onset   Diabetes Mother    Thyroid  disease Mother    Colon cancer Neg  Hx     Social History   Socioeconomic History   Marital status: Divorced    Spouse name: Not on file   Number of children: Not on file   Years of education: Not on file   Highest education level: Not on file  Occupational History   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 [place]   Tobacco Use   Smoking status: Never   Smokeless tobacco: Never  Vaping Use   Vaping status: Never Used  Substance and Sexual Activity   Alcohol use: Not Currently   Drug use: Not Currently   Sexual activity: Not on file  Other Topics Concern   Not on file  Social History Narrative   Not on file   Social Drivers of Health   Tobacco Use: Low Risk (07/02/2024)   Patient History    Smoking Tobacco Use: Never    Smokeless Tobacco Use: Never    Passive Exposure: Not on file  Financial Resource Strain: High Risk (03/11/2024)   Overall Financial Resource Strain (CARDIA)    Difficulty of Paying Living Expenses: Hard  Food Insecurity: No Food Insecurity (04/11/2024)   Epic    Worried About Programme Researcher, Broadcasting/film/video in the Last Year: Never true    Ran Out of Food in the Last Year: Never true  Transportation Needs: Unmet Transportation Needs (04/11/2024)   Epic    Lack of Transportation (Medical): Yes    Lack of Transportation (Non-Medical): No  Physical Activity: Not on file  Stress: Not on file  Social Connections: Moderately Integrated (08/28/2023)   Social Connection and Isolation Panel    Frequency of Communication with Friends and Family: Three times a week    Frequency of Social Gatherings with Friends and Family: Once a week    Attends Religious Services: More than 4 times per year    Active Member of [employer] or Organizations: No    Attends Engineer, Structural: More than 4 times per year    Marital Status: Divorced  Intimate Partner Violence: Not At Risk (04/11/2024)   Epic  Fear of Current or Ex-Partner: No    Emotionally Abused: No    Physically Abused: No     Sexually Abused: No  Depression (PHQ2-9): Low Risk (04/11/2024)   Depression (PHQ2-9)    PHQ-2 Score: 1  Alcohol Screen: Not on file  Housing: Low Risk (04/11/2024)   Epic    Unable to Pay for Housing in the Last Year: No    Number of Times Moved in the Last Year: 0    Homeless in the Last Year: No  Utilities: Not At Risk (04/11/2024)   Epic    Threatened with loss of utilities: No  Health Literacy: Not on file     Review of Systems   Gen: Denies any fever, chills, fatigue, weight loss, lack of appetite.  CV: Denies chest pain, heart palpitations, peripheral edema, syncope.  Resp: Denies shortness of breath at rest or with exertion. Denies wheezing or cough.  GI: see HPI GU : Denies urinary burning, urinary frequency, urinary hesitancy MS: Denies joint pain, muscle weakness, cramps, or limitation of movement.  Derm: Denies rash, itching, dry skin Psych: Denies depression, anxiety, memory loss, and confusion Heme: Denies bruising, bleeding, and enlarged lymph nodes.   Physical Exam   BP (!) 157/84   Pulse 76   Temp (!) 97.4 F (36.3 C)   Ht 5' 3 (1.6 m)   Wt 173 lb (78.5 kg)   BMI 30.65 kg/m  General:   Alert and oriented. Pleasant and cooperative. Well-nourished and well-developed.  Head:  Normocephalic and atraumatic. Eyes:  Without icterus Cardiac: regular rate Lungs: clear bilaterally Abdomen:  +BS, soft, non-tender and non-distended. No HSM noted. No guarding or rebound. No masses appreciated.  Rectal:  Deferred  Msk:  Symmetrical without gross deformities. Normal posture. Extremities:  Without edema. Neurologic:  Alert and  oriented x4 Skin:  Intact without significant lesions or rashes. Psych:  Alert and cooperative. Normal mood and affect.  Outside labs 06/03/24: Hgb 12.4, A1c 7.6 Outsisde labs Oct 2025: Ferritin 50, iron  64, sats 21  Assessment   Stephanie Moreno is a 79 y.o. female presenting today with a history of abdominal pain and nausea,  constipation, GERD, anemia multifactorial with chronic disease and IDA, previously established with GI in [place]  but moved to this area in June 2021, last seen in Sept 2025 and returning for follow-up of GERD, constipation, dealing now with dysphagia and GERD exacerbation along with constipation.  Dysphagia: prior dilation in 2023. Suspect needs dilation again. GERD also flaring with once daily PPI and will increase this to BID for now.   Constipation: increase Amitiza  to 24 mcg po BID. No overt hematochezia.  Hx of IDA: ferritin trending down, now 50, previously 207 a few years ago. Known hx of IDA with prior work-up as above. Holding on colonoscopy until she has EGD completed.   Hx of palpitations/irregular heart rate: noted at last visit. Referred to cardiology. No alarm signs/symptoms. Regular rate today with auscultation.       PLAN    Increase Amitiza  to 24 mcg po BID Increase omeprazole  to BID  EGD with dilation in near future Will follow-up on iron  studies again at next visit: if decreasing ferritin, will update colonoscopy. Hgb remaining stable/normal currently 2 months follow-up   Therisa MICAEL Stager, PhD, ANP-BC [facility]    "

## 2024-07-07 ENCOUNTER — Encounter (HOSPITAL_COMMUNITY)
Admission: RE | Admit: 2024-07-07 | Discharge: 2024-07-07 | Disposition: A | Discharge status: Home / Self Care | Source: Ambulatory Visit | Attending: Internal Medicine | Admitting: Internal Medicine

## 2024-07-09 ENCOUNTER — Other Ambulatory Visit: Payer: Self-pay

## 2024-07-09 ENCOUNTER — Encounter (HOSPITAL_COMMUNITY): Payer: Self-pay

## 2024-07-10 ENCOUNTER — Telehealth: Payer: Self-pay | Admitting: Gastroenterology

## 2024-07-10 ENCOUNTER — Ambulatory Visit: Admitting: Cardiology

## 2024-07-10 NOTE — Telephone Encounter (Signed)
 Received call from [facility], pharmacist with [facility]   Patient had diarrhea with increased dosage of Amitiza  24 mcg BID. We will have her hold this for 2 days, then resume at just once daily dosing of 24 mcg  [name] will contact patient to update and will revise medication packaging.

## 2024-07-11 ENCOUNTER — Ambulatory Visit (HOSPITAL_COMMUNITY): Admitting: Anesthesiology

## 2024-07-11 ENCOUNTER — Encounter (HOSPITAL_COMMUNITY): Payer: Self-pay | Admitting: Internal Medicine

## 2024-07-11 ENCOUNTER — Encounter (HOSPITAL_COMMUNITY)
Admission: RE | Disposition: A | Discharge status: Home / Self Care | Payer: Self-pay | Source: Home / Self Care | Attending: Internal Medicine

## 2024-07-11 ENCOUNTER — Ambulatory Visit (HOSPITAL_COMMUNITY)
Admission: RE | Admit: 2024-07-11 | Discharge: 2024-07-11 | Disposition: A | Discharge status: Home / Self Care | Attending: Internal Medicine | Admitting: Internal Medicine

## 2024-07-11 DIAGNOSIS — Z7951 Long term (current) use of inhaled steroids: Secondary | ICD-10-CM | POA: Diagnosis not present

## 2024-07-11 DIAGNOSIS — K2950 Unspecified chronic gastritis without bleeding: Secondary | ICD-10-CM | POA: Diagnosis not present

## 2024-07-11 DIAGNOSIS — K319 Disease of stomach and duodenum, unspecified: Secondary | ICD-10-CM

## 2024-07-11 DIAGNOSIS — G4730 Sleep apnea, unspecified: Secondary | ICD-10-CM | POA: Diagnosis not present

## 2024-07-11 DIAGNOSIS — K219 Gastro-esophageal reflux disease without esophagitis: Secondary | ICD-10-CM | POA: Insufficient documentation

## 2024-07-11 DIAGNOSIS — J449 Chronic obstructive pulmonary disease, unspecified: Secondary | ICD-10-CM | POA: Diagnosis not present

## 2024-07-11 DIAGNOSIS — K648 Other hemorrhoids: Secondary | ICD-10-CM | POA: Diagnosis not present

## 2024-07-11 DIAGNOSIS — I129 Hypertensive chronic kidney disease with stage 1 through stage 4 chronic kidney disease, or unspecified chronic kidney disease: Secondary | ICD-10-CM | POA: Insufficient documentation

## 2024-07-11 DIAGNOSIS — Z794 Long term (current) use of insulin: Secondary | ICD-10-CM | POA: Diagnosis not present

## 2024-07-11 DIAGNOSIS — E1122 Type 2 diabetes mellitus with diabetic chronic kidney disease: Secondary | ICD-10-CM | POA: Insufficient documentation

## 2024-07-11 DIAGNOSIS — K31A19 Gastric intestinal metaplasia without dysplasia, unspecified site: Secondary | ICD-10-CM | POA: Insufficient documentation

## 2024-07-11 DIAGNOSIS — N189 Chronic kidney disease, unspecified: Secondary | ICD-10-CM | POA: Insufficient documentation

## 2024-07-11 DIAGNOSIS — J45909 Unspecified asthma, uncomplicated: Secondary | ICD-10-CM | POA: Diagnosis not present

## 2024-07-11 DIAGNOSIS — K222 Esophageal obstruction: Secondary | ICD-10-CM | POA: Diagnosis not present

## 2024-07-11 DIAGNOSIS — K449 Diaphragmatic hernia without obstruction or gangrene: Secondary | ICD-10-CM | POA: Insufficient documentation

## 2024-07-11 DIAGNOSIS — R1310 Dysphagia, unspecified: Secondary | ICD-10-CM | POA: Diagnosis present

## 2024-07-11 DIAGNOSIS — Z7985 Long-term (current) use of injectable non-insulin antidiabetic drugs: Secondary | ICD-10-CM | POA: Insufficient documentation

## 2024-07-11 HISTORY — PX: ESOPHAGEAL DILATION: SHX303

## 2024-07-11 HISTORY — PX: ESOPHAGOGASTRODUODENOSCOPY: SHX5428

## 2024-07-11 LAB — GLUCOSE, CAPILLARY: Glucose-Capillary: 95 mg/dL (ref 70–99)

## 2024-07-11 MED ORDER — LACTATED RINGERS IV SOLN: INTRAVENOUS | Status: DC

## 2024-07-11 MED ORDER — LIDOCAINE 2% (20 MG/ML) 5 ML SYRINGE
INTRAMUSCULAR | Status: DC | PRN
  Administered 2024-07-11: 50 mg via INTRAVENOUS

## 2024-07-11 MED ORDER — PROPOFOL 10 MG/ML IV BOLUS
INTRAVENOUS | Status: DC | PRN
  Administered 2024-07-11: 20 mg via INTRAVENOUS
  Administered 2024-07-11: 50 mg via INTRAVENOUS
  Administered 2024-07-11: 30 mg via INTRAVENOUS
  Administered 2024-07-11: 50 mg via INTRAVENOUS
  Administered 2024-07-11: 20 mg via INTRAVENOUS

## 2024-07-11 MED ORDER — LACTATED RINGERS IV SOLN: INTRAVENOUS | Status: DC | PRN

## 2024-07-11 NOTE — Anesthesia Preprocedure Evaluation (Signed)
"                                    Anesthesia Evaluation  Patient identified by MRN, date of birth, ID band Patient awake    Reviewed: Allergy & Precautions, H&P , NPO status , Patient's Chart, lab work & pertinent test results, reviewed documented beta blocker date and time   Airway Mallampati: II  TM Distance: >3 FB Neck ROM: full    Dental no notable dental hx.    Pulmonary neg pulmonary ROS, shortness of breath, asthma , sleep apnea , COPD   Pulmonary exam normal breath sounds clear to auscultation       Cardiovascular Exercise Tolerance: Good hypertension, negative cardio ROS  Rhythm:regular Rate:Normal     Neuro/Psych  PSYCHIATRIC DISORDERS Anxiety Depression     Neuromuscular disease negative neurological ROS  negative psych ROS   GI/Hepatic negative GI ROS, Neg liver ROS,GERD  ,,  Endo/Other  negative endocrine ROSdiabetes    Renal/GU Renal diseasenegative Renal ROS  negative genitourinary   Musculoskeletal   Abdominal   Peds  Hematology negative hematology ROS (+) Blood dyscrasia, anemia   Anesthesia Other Findings   Reproductive/Obstetrics negative OB ROS                              Anesthesia Physical Anesthesia Plan  ASA: 3  Anesthesia Plan: General   Post-op Pain Management:    Induction:   PONV Risk Score and Plan: Propofol  infusion  Airway Management Planned:   Additional Equipment:   Intra-op Plan:   Post-operative Plan:   Informed Consent: I have reviewed the patients History and Physical, chart, labs and discussed the procedure including the risks, benefits and alternatives for the proposed anesthesia with the patient or authorized representative who has indicated his/her understanding and acceptance.     Dental Advisory Given  Plan Discussed with: CRNA  Anesthesia Plan Comments:          Anesthesia Quick Evaluation  "

## 2024-07-11 NOTE — Discharge Instructions (Addendum)
 EGD Discharge instructions Please read the instructions outlined below and refer to this sheet in the next few weeks. These discharge instructions provide you with general information on caring for yourself after you leave the hospital. Your doctor may also give you specific instructions. While your treatment has been planned according to the most current medical practices available, unavoidable complications occasionally occur. If you have any problems or questions after discharge, please call your doctor. ACTIVITY You may resume your regular activity but move at a slower pace for the next 24 hours.  Take frequent rest periods for the next 24 hours.  Walking will help expel (get rid of) the air and reduce the bloated feeling in your abdomen.  No driving for 24 hours (because of the anesthesia (medicine) used during the test).  You may shower.  Do not sign any important legal documents or operate any machinery for 24 hours (because of the anesthesia used during the test).  NUTRITION Drink plenty of fluids.  You may resume your normal diet.  Begin with a light meal and progress to your normal diet.  Avoid alcoholic beverages for 24 hours or as instructed by your caregiver.  MEDICATIONS You may resume your normal medications unless your caregiver tells you otherwise.  WHAT YOU CAN EXPECT TODAY You may experience abdominal discomfort such as a feeling of fullness or gas pains.  FOLLOW-UP Your doctor will discuss the results of your test with you.  SEEK IMMEDIATE MEDICAL ATTENTION IF ANY OF THE FOLLOWING OCCUR: Excessive nausea (feeling sick to your stomach) and/or vomiting.  Severe abdominal pain and distention (swelling).  Trouble swallowing.  Temperature over 101 F (37.8 C).  Rectal bleeding or vomiting of blood.   Your EGD revealed mild amount inflammation in your stomach.  I took biopsies of this to rule out infection with a bacteria called H. pylori.  Await pathology results, my  office will contact you.  You also had a tightening of your esophagus called a Schatzki's ring.  This is a benign ring related to acid reflux.  I stretched this out today.  Hopefully this helps with feeling of food getting stuck.  Continue omeprazole  twice daily.  Follow-up in GI office in 2 to 3 months.   I hope you have a great rest of your week!  Carlin POUR. Cindie, D.O. Gastroenterology and Hepatology [facility]

## 2024-07-11 NOTE — Transfer of Care (Signed)
 Immediate Anesthesia Transfer of Care Note  Patient: Stephanie Moreno  Procedure(s) Performed: EGD (ESOPHAGOGASTRODUODENOSCOPY) DILATION, ESOPHAGUS  Patient Location: Short Stay  Anesthesia Type:General  Level of Consciousness: awake  Airway & Oxygen Therapy: Patient Spontanous Breathing  Post-op Assessment: Report given to RN  Post vital signs: Reviewed and stable  Last Vitals:  Vitals Value Taken Time  BP 118/57 07/11/24 12:42  Temp 36.7 C 07/11/24 12:42  Pulse 84 07/11/24 12:42  Resp 25 07/11/24 12:42  SpO2 98 % 07/11/24 12:42    Last Pain:  Vitals:   07/11/24 1242  TempSrc: Oral  PainSc: 0-No pain      Patients Stated Pain Goal: 7 (07/11/24 1159)  Complications: No notable events documented.

## 2024-07-11 NOTE — Interval H&P Note (Signed)
 History and Physical Interval Note:  07/11/2024 12:07 PM  Stephanie Moreno  has presented today for surgery, with the diagnosis of dysphagia.  The various methods of treatment have been discussed with the patient and family. After consideration of risks, benefits and other options for treatment, the patient has consented to  Procedures with comments: EGD (ESOPHAGOGASTRODUODENOSCOPY) (N/A) - 1:30pm, ASA 3 DILATION, ESOPHAGUS (N/A) as a surgical intervention.  The patient's history has been reviewed, patient examined, no change in status, stable for surgery.  I have reviewed the patient's chart and labs.  Questions were answered to the patient's satisfaction.     [name]

## 2024-07-11 NOTE — Op Note (Signed)
 [facility] Patient Name: Stephanie Moreno Procedure Date: 07/11/2024 12:06 PM MRN: [id number] Date of Birth: 03-18-1946 Attending MD: Carlin POUR. Cindie , [place], [id number] CSN: [id number] Age: 79 Admit Type: Outpatient Procedure:                Upper GI endoscopy Indications:              Dysphagia Providers:                Carlin POUR. Cindie, DO, Olam Ada, RN, Daphne Mulch                            Technician, Technician Referring MD:              Medicines:                See the Anesthesia note for documentation of the                            administered medications Complications:            No immediate complications. Estimated Blood Loss:     Estimated blood loss was minimal. Procedure:                Pre-Anesthesia Assessment:                           - The anesthesia plan was to use monitored                            anesthesia care (MAC).                           After obtaining informed consent, the endoscope was                            passed under direct vision. Throughout the                            procedure, the patient's blood pressure, pulse, and                            oxygen saturations were monitored continuously. The                            [id number]-[id number] ([id number]) scope was introduced through                            the mouth, and advanced to the second part of                            duodenum. The upper GI endoscopy was accomplished                            without difficulty. The patient tolerated the                            procedure well. Scope In: 12:28:32 PM  Scope Out: 12:34:46 PM Total Procedure Duration: 0 hours 6 minutes 14 seconds  Findings:      A small hiatal hernia was present.      A mild Schatzki ring was found in the distal esophagus. A TTS dilator       was passed through the scope. Dilation with an 18-19-20 mm balloon       dilator was performed to 20 mm. The dilation site was examined and       showed mild mucosal  disruption and moderate improvement in luminal       narrowing.      Patchy mild inflammation was found in the gastric body. Biopsies were       taken with a cold forceps for Helicobacter pylori testing.      The duodenal bulb, first portion of the duodenum and second portion of       the duodenum were normal. Impression:               - Small hiatal hernia.                           - Mild Schatzki ring. Dilated.                           - Gastritis. Biopsied.                           - Normal duodenal bulb, first portion of the                            duodenum and second portion of the duodenum. Moderate Sedation:      Per Anesthesia Care Recommendation:           - Patient has a contact number available for                            emergencies. The signs and symptoms of potential                            delayed complications were discussed with the                            patient. Return to normal activities tomorrow.                            Written discharge instructions were provided to the                            patient.                           - Resume previous diet.                           - Continue present medications.                           - Await pathology results.                           -  Repeat upper endoscopy PRN for retreatment.                           - Return to GI clinic in 3 months.                           - Use a proton pump inhibitor PO BID. Procedure Code(s):        --- Professional ---                           [phone], Esophagogastroduodenoscopy, flexible,                            transoral; with transendoscopic balloon dilation of                            esophagus (less than 30 mm diameter)                           [id number], 59, Esophagogastroduodenoscopy, flexible,                            transoral; with biopsy, single or multiple Diagnosis Code(s):        --- Professional ---                           K44.9,  Diaphragmatic hernia without obstruction or                            gangrene                           K22.2, Esophageal obstruction                           K29.70, Gastritis, unspecified, without bleeding                           R13.10, Dysphagia, unspecified CPT copyright 2022 American Medical Association. All rights reserved. The codes documented in this report are preliminary and upon coder review may  be revised to meet current compliance requirements. Carlin POUR. Cindie, DO Carlin POUR. Cindie, DO 07/11/2024 12:38:39 PM This report has been signed electronically. Number of Addenda: 0

## 2024-07-11 NOTE — Anesthesia Postprocedure Evaluation (Signed)
"   Anesthesia Post Note  Patient: Stephanie Moreno  Procedure(s) Performed: EGD (ESOPHAGOGASTRODUODENOSCOPY) DILATION, ESOPHAGUS  Patient location during evaluation: Short Stay Anesthesia Type: General Level of consciousness: awake and alert Pain management: pain level controlled Vital Signs Assessment: post-procedure vital signs reviewed and stable Respiratory status: spontaneous breathing Cardiovascular status: blood pressure returned to baseline and stable Postop Assessment: no apparent nausea or vomiting Anesthetic complications: no   No notable events documented.   Last Vitals:  Vitals:   07/11/24 1159 07/11/24 1242  BP: (!) 164/71 (!) 118/57  Pulse: 70 84  Resp: 18 (!) 25  Temp: (!) 36.3 C 36.7 C  SpO2: 98% 98%    Last Pain:  Vitals:   07/11/24 1242  TempSrc: Oral  PainSc: 0-No pain                 Chevie Birkhead      "

## 2024-07-14 ENCOUNTER — Encounter (HOSPITAL_COMMUNITY): Payer: Self-pay | Admitting: Internal Medicine

## 2024-07-14 LAB — SURGICAL PATHOLOGY

## 2024-07-17 ENCOUNTER — Ambulatory Visit: Payer: Self-pay | Admitting: Internal Medicine

## 2024-07-21 ENCOUNTER — Ambulatory Visit: Payer: 59 | Admitting: Urology

## 2024-08-07 ENCOUNTER — Ambulatory Visit: Admitting: Cardiology

## 2024-08-14 ENCOUNTER — Ambulatory Visit: Admitting: Nurse Practitioner

## 2024-09-26 ENCOUNTER — Ambulatory Visit: Admitting: Internal Medicine

## 2024-11-06 ENCOUNTER — Ambulatory Visit: Admitting: Diagnostic Neuroimaging
